# Patient Record
Sex: Female | Born: 1955 | Race: Black or African American | Hispanic: No | Smoking: Former smoker
Health system: Southern US, Community
[De-identification: ages and names within clinical notes are randomized; demographics above are authoritative.]

## PROBLEM LIST (undated history)

## (undated) DIAGNOSIS — I639 Cerebral infarction, unspecified: Secondary | ICD-10-CM

## (undated) DIAGNOSIS — M359 Systemic involvement of connective tissue, unspecified: Secondary | ICD-10-CM

## (undated) DIAGNOSIS — J849 Interstitial pulmonary disease, unspecified: Secondary | ICD-10-CM

## (undated) DIAGNOSIS — J449 Chronic obstructive pulmonary disease, unspecified: Secondary | ICD-10-CM

## (undated) DIAGNOSIS — M329 Systemic lupus erythematosus, unspecified: Secondary | ICD-10-CM

## (undated) HISTORY — PX: APPENDECTOMY: SHX54

## (undated) HISTORY — PX: TONSILLECTOMY: SUR1361

## (undated) HISTORY — PX: ABDOMINAL HYSTERECTOMY: SHX81

---

## 2002-07-05 ENCOUNTER — Encounter: Payer: Self-pay | Admitting: Internal Medicine

## 2002-07-05 ENCOUNTER — Inpatient Hospital Stay (HOSPITAL_COMMUNITY): Admission: EM | Admit: 2002-07-05 | Discharge: 2002-07-07 | Payer: Self-pay | Admitting: *Deleted

## 2002-07-05 ENCOUNTER — Encounter: Payer: Self-pay | Admitting: *Deleted

## 2002-07-06 ENCOUNTER — Encounter: Payer: Self-pay | Admitting: Cardiology

## 2002-07-13 ENCOUNTER — Encounter: Payer: Self-pay | Admitting: Emergency Medicine

## 2002-07-13 ENCOUNTER — Emergency Department (HOSPITAL_COMMUNITY): Admission: EM | Admit: 2002-07-13 | Discharge: 2002-07-13 | Payer: Self-pay | Admitting: Emergency Medicine

## 2002-07-13 ENCOUNTER — Encounter: Admission: RE | Admit: 2002-07-13 | Discharge: 2002-07-13 | Payer: Self-pay | Admitting: Internal Medicine

## 2002-07-13 ENCOUNTER — Encounter: Payer: Self-pay | Admitting: Internal Medicine

## 2002-09-29 ENCOUNTER — Emergency Department (HOSPITAL_COMMUNITY): Admission: EM | Admit: 2002-09-29 | Discharge: 2002-09-30 | Payer: Self-pay

## 2003-01-05 ENCOUNTER — Emergency Department (HOSPITAL_COMMUNITY): Admission: EM | Admit: 2003-01-05 | Discharge: 2003-01-06 | Payer: Self-pay | Admitting: Emergency Medicine

## 2003-01-06 ENCOUNTER — Encounter: Payer: Self-pay | Admitting: Emergency Medicine

## 2003-04-24 ENCOUNTER — Encounter: Admission: RE | Admit: 2003-04-24 | Discharge: 2003-07-23 | Payer: Self-pay

## 2004-07-10 ENCOUNTER — Emergency Department: Payer: Self-pay | Admitting: Unknown Physician Specialty

## 2005-08-12 ENCOUNTER — Emergency Department (HOSPITAL_COMMUNITY): Admission: EM | Admit: 2005-08-12 | Discharge: 2005-08-12 | Payer: Self-pay | Admitting: Emergency Medicine

## 2005-09-10 ENCOUNTER — Ambulatory Visit: Payer: Self-pay | Admitting: Internal Medicine

## 2006-09-04 ENCOUNTER — Inpatient Hospital Stay (HOSPITAL_COMMUNITY): Admission: EM | Admit: 2006-09-04 | Discharge: 2006-09-07 | Payer: Self-pay | Admitting: Emergency Medicine

## 2006-12-13 ENCOUNTER — Emergency Department (HOSPITAL_COMMUNITY): Admission: EM | Admit: 2006-12-13 | Discharge: 2006-12-13 | Payer: Self-pay | Admitting: Emergency Medicine

## 2007-05-09 ENCOUNTER — Emergency Department (HOSPITAL_COMMUNITY): Admission: EM | Admit: 2007-05-09 | Discharge: 2007-05-09 | Payer: Self-pay | Admitting: *Deleted

## 2007-05-16 ENCOUNTER — Emergency Department (HOSPITAL_COMMUNITY): Admission: EM | Admit: 2007-05-16 | Discharge: 2007-05-16 | Payer: Self-pay | Admitting: Emergency Medicine

## 2011-01-01 NOTE — H&P (Signed)
NAME:  [name], [name] NO.:  [id number]   MEDICAL RECORD NO.:  [id number]          PATIENT TYPE:  EMS   LOCATION:  MAJO                         FACILITY:  [facility]   PHYSICIAN:  Hettie Holstein, D.O.    DATE OF BIRTH:  24-Aug-1955   DATE OF ADMISSION:  09/04/2006  DATE OF DISCHARGE:                              HISTORY & PHYSICAL   PRIMARY CARE PHYSICIAN:  Unassigned.   GENERAL SURGERY CONSULTATION IN EMERGENCY DEPARTMENT:  Avel Peace,  M.D.   CHIEF COMPLAINT:  Right-sided abdominal pain with nausea, but no  vomiting, beginning yesterday.  Has a previous history for appendectomy,  hysterectomy, and cholecystectomy.  No prior history of bowel  obstruction or ileus.  Was discovered to have CT evidence of partial  small-bowel obstruction and felt possible to be related to adhesion  formation.  She was seen in the emergency department by Dr. Abbey Chatters.  Also, she was discovered to be hypokalemic, as well as discovered to  have a urinary tract infection.  She is being admitted for bowel rest,  observation, IV hydration.  We will follow her clinical course with  General Surgery.   PAST MEDICAL HISTORY:  Significant for lupus and E-chart reporting some  previous history of nephritis.  She is treated by Lavenia Atlas, M.D.,  in [place].  She is on chronic steroids.  On her CT this admission,  it is noticed that she does have some mild interstitial lung disease.  Her O2 saturation is marginal, 91% to 92%.  She is a current smoker.  In  addition, this admission, her CT revealed evidence of fatty liver.  She  is status post cholecystectomy, appendectomy, hysterectomy.  She has had  some back surgery at [facility].   MEDICATIONS:  Medications are as follows.  These are obtained from  medication list in from [facility].  She takes:  1. Aggrenox 1 tablet twice a day.  2. Gabapentin 300 mg daily.  3. Potassium chloride 20 mEq daily.  4. Prednisone  10 mg daily.  5. Detrol LA 4 mg p.o. daily.  6. Hydroxychloroquine 200 mg once a day.  7. Lisinopril 10 mg daily.  8. Lexapro 10 mg daily.  9. Trazodone 100 mg daily.  10.Lasix once a day.   ALLERGIES:  NO KNOWN DRUG ALLERGIES.   SOCIAL HISTORY:  She resides at [facility].  She  continues to smoke approximately a half a pack of cigarettes per day.  She does not drink alcohol.   FAMILY HISTORY:  Noncontributory.   REVIEW OF SYMPTOMS:  She had been in her usual state of health.  She  does ambulate, but has been unsteady on her feet according to her  family.  She has had no vomiting.  No blood in her stools.  No swelling  of her lower extremities.  The family has noticed some hypertrophy of  her nails, felt to be related to onychomycosis.  Otherwise, she has had  no fevers, chest pains, or shortness of breath.   PHYSICAL EXAMINATION:  In the department, her blood pressure is 122/82.  O2 saturation 91% on room air.  Heart rate 92.  Temperature 97.3.  General:  The patient is alert, oriented, no acute distress.  She does  close her eyes predominately during the examination.  She denies  photosensitivity; however, the family states that this is typical for  her.  HEENT:  Reveals her head to normocephalic, atraumatic.  Extraocular muscles are intact.  Neck:  Supple, nontender.  No palpable  thyromegaly or mass.  Cardiovascular exam:  Reveals normal S1, S2.  Lungs are clear bilaterally.  Abdomen:  Soft, slightly distended, and  minimal tenderness to deep palpation.  There is no rebound or guarding.  Bowel sounds are hypoactive.  Lower extremities:  Reveal very dry, scaly  skin.  Her nails are hypertrophic with the appearance of onychomycosis.  Neurological exam:  She moves all 4 extremities spontaneously.  There is  no focal deficit.   LABORATORY DATA:  Sodium 135, potassium 3.2, BUN 10, creatinine 0.78,  AST/ALT 36/24, albumin 3.2.  WBC 6.1, hemoglobin 13.9, platelet  count  129, MCV of 95.  CT, as noted above, revealed mild-to-moderate partial  small-bowel obstruction, questionable adhesion formation, with mild  diffuse fatty infiltration of the liver, and some mild interstitial lung  disease noted as well.  Urinalysis revealed 11 to 20 WBC.  Her lipase  was normal.   ASSESSMENT:  1. Partial small-bowel obstruction versus ileus.  2. Hypokalemia.  3. Urinary tract infection.  4. Chronic steroid therapy for lupus, fatty liver, as well as      interstitial lung disease.   PLAN:  At this time, we are going to admit the patient for bowel rest.  We will replete her electrolytes and administer IV fluids, maintain her  n.p.o., administer steroids via IV route, hold all of her oral  medications, and treat her urinary tract infection with Cipro.  Await  cultures for further customization based on these results.  We will  follow her clinical course with General Surgery.      Hettie Holstein, D.O.  Electronically Signed     ESS/MEDQ  D:  09/04/2006  T:  09/04/2006  Job:  [id number]

## 2011-01-01 NOTE — Consult Note (Signed)
NAME:  [name], [name]             ACCOUNT NO.:  [id number]   MEDICAL RECORD NO.:  [id number]          PATIENT TYPE:  EMS   LOCATION:  MAJO                         FACILITY:  [facility]   PHYSICIAN:  Pramod P. Pearlean Brownie, MD    DATE OF BIRTH:  1955-09-21   DATE OF CONSULTATION:  08/12/2005  DATE OF DISCHARGE:                                   CONSULTATION   REFERRING PHYSICIAN:  Dr. __________.   REASON FOR CONSULTATION:  Stroke.   HISTORY OF PRESENT ILLNESS:  [name] is a 55 year old African-American  lady who has been complaining of increasing difficulty walking with  stiffness and heaviness in her legs for the last 2-3 days.  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  She did not have neurological workup or followup after  that.  She was treated by her primary physician in [place].  She was  initially on aspirin but stopped it after a short time.  Her risk factors  include hypertension and  smoking.  The patient has been walking in the  emergency room since arrival.  She feels her legs do not feel right and she  has some worsening of right-sided numbness for the last few days.  On  inquiry, she also admits to increasing urinary frequency which she has had  off and on and has had recurrent UTI's.  She on inquiry also admits to  having noticed some difficulty in swallowing liquids for the last few days.  She denies any slurred speech, double vision, vertigo or increased focal  extremity weakness.   PAST MEDICAL HISTORY:  Lupus erythematosus for which she was seen by Dr.  Gavin Potters in [place] but she has not seen him for a long time.  She  remains on prednisone 5 mg a day for that.  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  Not significant  for ischemic heart disease, hyperlipidemia or diabetes.   CURRENT MEDICATIONS:  Prednisone 5 mg a day.  She does not take  any blood  pressure medications or aspirin at present.   SOCIAL HISTORY:  The patient is on disability.  She lives with her mother in  [place].  She was separated three years ago from her husband.  She used  to smoke half pack per day for 35 years and the last three months she has  cut down to 4-5 cigarettes a day.  She denies any alcohol or doing drugs.   REVIEW OF SYSTEMS:  Positive for depression following her stroke.  The  mother states she hardly goes out and has very decreased spontaneity and  does not talk a lot.  She cries a lot.  No history of diarrhea, chest pain,  palpitations, cough or shortness of breath.   PHYSICAL EXAMINATION:  GENERAL:  Reveals a middle-aged African-American lady  not in distress.  She is not febrile.  VITAL SIGNS:  Pulse rate 89 per minute regular, blood pressure 170/79,  respiratory rate 16 per  minute, distal pulses are heard.  HEENT:  Head is atraumatic.  ENT exam unremarkable.  NECK:  Supple without bruits.  CARDIAC:  Regular heart sounds.  LUNGS:  Clear to auscultation.  ABDOMEN:  Soft, nontender.  NEUROLOGIC:  The patient is awake, alert and oriented to time, place and  person.  She looks depressed.  She responds appropriately.  She is  emotionally labile and started crying briefly during the exam.  There is no  dysarthria or dysphagia.  Pupils are regular but reactive.  Eye movements  are full, there is no nystagmus.  __________.  Face is symmetric.  Palate  movements are normal.  Tongue is midline.  ___________.  Motor system exam  reveals no pronator drift.  Symmetric __________.  Reflexes are 2+ and  symmetric.  Plantar is downgoing.  Fine finger movements are symmetric.  She  does not __________ the left or right upper extremity.  Gait is slow and  steady.  There is slight increased tone in the lower extremities but I do  not  see any focal weakness.  She can stand on the metal base unsupported.  She had slight difficulty with tandem  walking.   LABORATORY DATA:  Potassium is low at 3.0, rest of the electrolytes are  normal.  BUN 8, creatinine report is not back.  UA shows 3-6 wbc's with  weakly positive leukocyte esterase.  CT scan of the head shows low density  in both basal ganglia as well as question of low density in the left pons  consistent with remote age infarct.  No acute abnormality on image is noted.   IMPRESSION:  A 55 year old lady with previous history of left brain stroke  and mild right-sided weakness presents with slightly worsening of her  symptoms likely multifactorial in etiology secondary to a combination of  urinary tract infection as well as depression.  I doubt this acute stroke.  I recommend getting an MRI scan of the brain to get a better idea of both  the timing and nature of her various strokes.  Appropriate treatment for  urinary tract infection and depression.  Followup with her primary care  physician in [place] for her medical problems.  Aspirin 81 mg a day for  stroke prevention.  Strict control of her hypertension with systolic blood  pressure well below 130 and will be started on antihypertensives as per her  family physician Dr. __________.  She may follow up electively as an  outpatient in their office if necessary or else with the neurologist in  [place].   Thank you for the referral.           ______________________________  Sunny Schlein. Pearlean Brownie, MD     PPS/MEDQ  D:  08/12/2005  T:  08/12/2005  Job:  [id number]

## 2011-01-01 NOTE — Discharge Summary (Signed)
[name], [name] NO.:  [id number]   MEDICAL RECORD NO.:  [id number]                   PATIENT TYPE:  INP   LOCATION:  3709                                 FACILITY:  [facility]   PHYSICIAN:  Fransisco Hertz, M.D.               DATE OF BIRTH:  April 05, 1956   DATE OF ADMISSION:  07/05/2002  DATE OF DISCHARGE:  07/07/2002                                 DISCHARGE SUMMARY   PRIMARY CARE PHYSICIAN:  Dr. Ladell Pier, Department of Internal  Medicine.   RHEUMATOLOGY:  Dr. Earlene Plater.   DISCHARGE DIAGNOSES:  1. Chest pain secondary to rib fractures.  2. 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  3. Hypertension.  4. Tobacco abuse.   DISCHARGE MEDICATIONS:  1. Hydrochlorothiazide 25 mg p.o. q.d.  2. Prednisone 10 mg p.o. q.d.  3. Calcium carbonate 1500 mg p.o. t.i.d. with meals.  4. Ergocalciferol one tab p.o. t.i.d. with meals.  5. Percocet 5/325 one p.o. q.6h. p.r.n. pain.  6. Atarax 25 mg p.o. q.8h. p.r.n. itch.   DISPOSITION:  The patient is discharged home in good condition.   FOLLOW UP:  The patient is to follow up in the outpatient clinic with Dr.  Ladell Pier on July 31, 2002 at 1:30 p.m.  In the interim, she is to  have a DEXA scan.  Unfortunately, we were unable to schedule the scan at  this time but I will do this for her and call her with her appointment on  Monday.   ISSUES TO FOLLOWUP:  The patient will require follow up of her DEXA scan.  It is likely that she has osteopenia secondary to her chronic steroid use.  She has been placed on calcium carbonate and ergocalciferol.  If this does  in fact show osteopenia the patient would benefit with some Alendronate 70  mg p.o. every week and possible endocrinology consultation for consideration  of recombinant parathyroid hormone.   ADMITTING HISTORY AND PHYSICAL WITH LABORATORIES:   HISTORY OF PRESENT ILLNESS:  The patient is a 55 year old African-American  female with a past medical history significant for systemic lupus  erythematosus diagnosed in 1990 on chronic prednisone who presents with  heavy substernal chest pain.  The patient reports that she was lying in bed  at approximately 11:30 when she had a sudden onset of heavy substernal chest  pain.  The chest pain was accompanied by nausea and shortness of breath.  The pain was worsened when the patient was supine.  The pain improved over  time but had not completely resolved by 5:30 a.m.  The pain was concerning  enough that the patient came in the ER for further evaluation.  The patient  reports that she did have similar episode 2 weeks ago with exquisite pain in  the left breast and back and was seen in the [facility] ER.  She was treated  with narcotics.  She had  a mammogram.  This was normal.  Per the patient,  the pain was an 8-9/10 prior to the emergency department and was only  temporarily improved with sublingual nitroglycerin.   PHYSICAL EXAMINATION ON ADMISSION:  VITAL SIGNS: Temperature 97.8, pulse  107, blood pressure 135/78, respiratory rate 24, O2 saturations 99% on room  air.  GENERAL: The patient was awake and alert.  She was pleasant but a little  anxious.  She was sitting up in bed.  HEENT: Sclerae were nonicteric and not injected.  Pupils were equal, round,  and reactive to light.  TMs were within normal limits.  The oropharynx was  benign.  NECK: Supple with no JVD or carotid bruits appreciated.  RESPIRATIONS: Clear to auscultation bilaterally without wheezes, crackles,  or rhonchi.  CARDIOVASCULAR: Tachycardic with a regular rhythm.  No rub was appreciated.  There was tenderness to palpation diffusely in the chest.  ABDOMEN: Soft, nontender, nondistended, with normal bowel sounds.  EXTREMITIES: 1 to 2+ pitting edema in the bilateral lower extremities.  SKIN: Dry skin on the bilateral lower extremities, worse on the feet.  NEUROLOGIC: Nonfocal.   LABORATORY DATA ON  ADMISSION:  White blood cell count 7.4, hemoglobin 14.6,  hematocrit 44, platelets 102, ANC 51, MCV of 88.  Sodium 142, potassium 3.5,  chloride 107, bicarb 23, BUN 13, creatinine 0.7, glucose 87, sed rate 25.  Initial cardiac enzymes revealed CK of 3.6, MB of 0.5, troponin 0.02.  Chest  x-ray revealed no acute disease.  EKG revealed sinus tachycardia with a rate  of 101.  There was good R-wave progression and no ST elevation or depression  consistent with ischemia.  A CT on admission revealed no evidence of PE or  DVT.  The patient did have multiple old rib fractures and one left upper rib  fracture that was in the healing process.   HOSPITAL COURSE:  Problem 1. CHEST PAIN.  The patient was admitted and ruled  out for a myocardial infarction.  Her enzymes remained normal and her EKGs  did not change significantly.  Given that we had ruled out cardiac cause of  the pain or PE, it was felt that the pain was indeed secondary to the  patient's rib fractures.  Of note, the patient has been on prednisone for  many years with no calcium or vitamin D supplementation.  It is likely that  she has an underlying osteopenia that causes her to fracture rather easily.  The patient will undergo a DEXA scan on an outpatient basis.  She was  started on calcium and vitamin D supplementation.  The patient may benefit  from Alendronate plus/minus recombinant parathyroid hormone should she  indeed have severe osteopenia leading to these rib fractures.  She was  treated symptomatically with Percocet and discharged home with this  medication.   Problem 2. SYSTEMIC LUPUS ERYTHEMATOSUS COMPLICATED BY THROMBOCYTOPENIA.  The patient should have her platelet count monitored on an outpatient basis.  She will continue on her prednisone 10 mg p.o. q.d. while in the hospital.  She is discharged home on this medication as well.  Problem 3. HYPERTENSION.  The patient's blood pressure was relatively well  controlled with  systolics in the 100s-120s on her hydrochlorothiazide.  She  will be discharged home on this same dose.  Her blood pressure may require  further monitoring in the outpatient setting.   Problem 4. TOBACCO ABUSE.  The patient was counseled regarding her tobacco  abuse.  This should continue to  occur in the outpatient setting as this is  not beneficial for her cardiac or pulmonary health.    DISCHARGE LABORATORIES:  The final available labs drawn on this patient were  drawn on July 07, 2002 and were as follows: White blood cell count 6.5,  hemoglobin 13.7, platelets 93; sodium 142, potassium 3.7, chloride 107,  bicarb 29, BUN 6, creatinine 0.7, glucose 76, calcium 9.0.     Kennith Gain, M.D.                  Fransisco Hertz, M.D.    CM/MEDQ  D:  07/07/2002  T:  07/08/2002  Job:  [id number]   cc:   Ladell Pier, M.D.  [address]-Internal Medicine Resident  [place], [place] [address]  Fax: [phone]   Dr. Earlene Plater, Rheumatology   Outpatient Clinic

## 2011-01-01 NOTE — Discharge Summary (Signed)
[name], [name] NO.:  [id number]   MEDICAL RECORD NO.:  [id number]          PATIENT TYPE:  INP   LOCATION:  5731                         FACILITY:  [facility]   PHYSICIAN:  Beckey Rutter, MD  DATE OF BIRTH:  1955-09-15   DATE OF ADMISSION:  09/04/2006  DATE OF DISCHARGE:  09/07/2006                               DISCHARGE SUMMARY   CHIEF COMPLAINT ON ADMISSION:  Right-sided abdominal pain.   PRIMARY CARE PHYSICIAN:  Patient is unassigned to [facility].   HISTORY OF PRESENT ILLNESS:  Briefly, this patient is a 55 year old  African American female with a past medical history significant for  stroke, current smoker, history of appendectomy, and history of  cholecystectomy and hysterectomy.  She came with a picture of small  bowel obstruction, which has resolved during hospital course.   CONSULTATION DURING HOSPITAL COURSE:  Patient was seen by Dr. Avel Peace from surgery with recommendation of bowel rest.  Consultation  was done on the January 20, date of admission.   HOSPITAL PROCEDURES:  Patient has underwent CT pelvis on September 04, 2006, with impression of:  1. Mild-to-moderate partial small bowel proximal obstruction, most      likely due to adhesion formation.  2. Mild diffuse fatty infiltration of the liver.  3. Small liver cyst.  4. Mild interstitial lung disease.   Patient also had CT abdomen showing no acute pelvis abnormality.  The  abdomen result was read earlier.   Abdomen x-ray done on January 21, with impression of:  1. Improvement in small bowel dilatation.  2. Retained contrast in the right colon.  3. No free air.   Patient then had a CT head without contrast showing mild generalized  atrophy, normal ventricular morphology, no midline shift or mid-mass  effect, old left pontine infarct, old bilateral basal ganglia infarcts  extending to the parietal and periventricular white matter.  No cortical  infarction, hemorrhage, or  mass.  Remainder of posterior fossa  unremarkable.  No extra-axial fluid collection visualized, and bones  unremarkable.  Their impression was old left pontine and bilateral basal  ganglia lacunar infarcts, no acute intracranial abnormalities.  And the  patient had MRI done today, January 23, with the final result still  pending, but primary report showing no acute pathology.   DISCHARGE MEDICATIONS:  1. Aggrenox 1 tablet twice a day.  2. Gabapentin/Neurontin 300 mg daily.  3. Potassium chloride 20 mg p.o. daily.  4. Prednisone 10 mg daily.  5. Detrol LA 4 mg p.o. daily.  6. Hydroxychloroquine 200 mg once daily.  7. Lisinopril 100 mg daily.  8. Lexapro 10 mg daily.  9. Trazodone 100 mg daily.  10.Lasix 20 mg once a day.   DISCHARGE DIAGNOSES:  1. Partial small bowel obstruction.  2. Hypokalemia.  3. Urinary tract infection.  4. Chronic steroid therapy for lupus.  5. Fatty liver, as well as interstitial lung disease.   HOSPITAL COURSE:  [name] is a 55 year old African American female  who came with picture of intestinal obstruction that improved during  hospital course.  She was hypokalemic and  that was repleted during  hospital course, as well.  Patient also had evidence of urinary tract  infection.  She was receiving Cipro since admission.  Patient will be  discharged on Cipro 500 mg twice a day.  During hospital course, patient  showed evidence of weakness that was suspicious of new CVA.  Patient was  evaluated by CT head, which is negative for acute event.  Also, she was  evaluated by MRI that was essentially negative for acute intracranial  pathology.  Recommendation is patient to be discharged on Cipro for 3  more days.  Patient has evidence of fatty liver on the CAT scan, and for  that we would recommend liver function tests to be evaluated, and  consider statin as needed though patient has CVA in the past and  secondary prevention of CVA is crucial at this time.   We think the CVA  is essentially because of the effect of lupus rather than plaque  obstruction pathology.  So, we would prefer to hold on to the statin at  the current time until fatty liver is improved.  Patient is stable for  discharge today.      Beckey Rutter, MD  Electronically Signed     EME/MEDQ  D:  09/07/2006  T:  09/07/2006  Job:  [id number]

## 2011-01-01 NOTE — Consult Note (Signed)
[name], BENN NO.:  [id number]   MEDICAL RECORD NO.:  [id number]          PATIENT TYPE:  INP   LOCATION:  5731                         FACILITY:  [facility]   PHYSICIAN:  Adolph Pollack, M.D.DATE OF BIRTH:  1955/09/03   DATE OF CONSULTATION:  DATE OF DISCHARGE:                                 CONSULTATION   REASON:  Small bowel obstruction.   HISTORY OF PRESENT ILLNESS:  This is a 55 year old female with abrupt  onset of sharp right lower quadrant suprapubic pain yesterday around  lunch.  Pain persisted.  She had some nausea but no vomiting.  She was  having dysuria.  She had a normal bowel yesterday, has been passing gas  yesterday and today since the pain started.  She presents to the  emergency department for evaluation.  In the emergency department, she  underwent a CT scan looking for source of the pain.  This demonstrated  some mild to moderate proximal small bowel dilatation with contrast in  the colon and small amount of free fluid.  It was felt this could  potentially represent an early small-bowel obstruction and I was called  to see her.   PAST MEDICAL HISTORY:  1. Lupus.  2. Depression.  3. Urinary incontinence.  4. Cerebrovascular accident.  5. Hypertension.  6. Lumbar spine disease.   PREVIOUS OPERATIONS:  1. Lower back surgery.  2. Appendectomy.  3. Cholecystectomy.  4. Abdominal hysterectomy.   ALLERGIES:  None.   MEDICATIONS:  Aggrenox, gabapentin, potassium, prednisone, Detrol,  hydroxychloroquine, lisinopril, Lexapro, Lasix, trazodone.   SOCIAL HISTORY:  She is a smoker.  Denies drug abuse or alcohol use.   REVIEW OF SYSTEMS:  CARDIOVASCULAR:  She denies heart disease.  GENERALLY:  She said she has had a 10-pound weight loss over the last  year or two.  PULMONARY:  She denies asthma, COPD, pneumonia.  GI:  She  denies peptic ulcer disease and diverticulitis.  GU:  She denies kidney  stones.  HEMATOLOGIC:  She denies deep  venous thrombosis or bleeding  disorders.   PHYSICAL EXAM:  GENERALLY:  Well-developed, well-nourished female in no  acute distress, pleasant, cooperative.  Temperature is 97, blood  pressure 122/82, pulse 92.  NECK:  Supple without masses.  RESPIRATORY:  Breath sounds equal and clear.  Respirations unlabored.  CARDIOVASCULAR:  Demonstrates a regular rate and regular rhythm.  I do  not hear a murmur.  ABDOMEN:  Soft.  There is a right upper quadrant scar, right lower  quadrant scar and lower transverse scar all solid without hernias.  A  small umbilical hernia is present.  She has most of her tenderness to  palpation in the suprapubic region and a little bit in the right lower  quadrant area.  EXTREMITIES:  She had some trace pretibial edema at present.   LABORATORY DATA:  White cell count 6100, no left shift, potassium 3.2.  Urinalysis shows 11-20 white blood cells and many bacteria.  CT scan  reviewed and of note is that contrast does get through the right and  proximal transverse colon.  There are  a few dilated small-bowel loops  proximally.   IMPRESSION:  Urinary tract infection likely with mild reactive ileus-  doubt small-bowel obstruction at this time.   RECOMMENDATIONS:  I think she needs bowel rest and have her urinary  tract infection treated.  I will order follow-up x-rays in the morning.      Adolph Pollack, M.D.  Electronically Signed     TJR/MEDQ  D:  09/04/2006  T:  09/04/2006  Job:  [id number]

## 2011-05-27 LAB — DIFFERENTIAL
Basophils Absolute: 0
Lymphocytes Relative: 24
Lymphs Abs: 1.3
Monocytes Absolute: 0.5
Neutro Abs: 3.4

## 2011-05-27 LAB — I-STAT 8, (EC8 V) (CONVERTED LAB)
BUN: 15
Bicarbonate: 26.2 — ABNORMAL HIGH
HCT: 45
Hemoglobin: 15.3 — ABNORMAL HIGH
Operator id: 294501
Potassium: 3 — ABNORMAL LOW
Sodium: 145
TCO2: 28

## 2011-05-27 LAB — CBC
HCT: 41.7
Hemoglobin: 14.1
RDW: 13.2
WBC: 5.3

## 2011-05-27 LAB — POCT I-STAT CREATININE: Operator id: 294501

## 2015-12-20 ENCOUNTER — Encounter: Payer: Self-pay | Admitting: Emergency Medicine

## 2015-12-20 DIAGNOSIS — Z8673 Personal history of transient ischemic attack (TIA), and cerebral infarction without residual deficits: Secondary | ICD-10-CM | POA: Insufficient documentation

## 2015-12-20 DIAGNOSIS — Z87891 Personal history of nicotine dependence: Secondary | ICD-10-CM | POA: Diagnosis not present

## 2015-12-20 DIAGNOSIS — L97903 Non-pressure chronic ulcer of unspecified part of unspecified lower leg with necrosis of muscle: Secondary | ICD-10-CM | POA: Diagnosis not present

## 2015-12-20 DIAGNOSIS — Z79899 Other long term (current) drug therapy: Secondary | ICD-10-CM | POA: Insufficient documentation

## 2015-12-20 DIAGNOSIS — L98499 Non-pressure chronic ulcer of skin of other sites with unspecified severity: Secondary | ICD-10-CM | POA: Insufficient documentation

## 2015-12-20 NOTE — ED Notes (Signed)
Pt here with family friend with whom she's been staying for a few weeks; pt with large ulcerated area to right lower leg; serosanguinous drainage; family member says when pt has her bedroom door closed for a bit and then opens it, she can smell a foul odor; pt says pain to leg intermittently; denies fever; pt with pitting edema to bilateral lower extremities;

## 2015-12-21 ENCOUNTER — Emergency Department
Admission: EM | Admit: 2015-12-21 | Discharge: 2015-12-21 | Disposition: A | Discharge status: Home / Self Care | Payer: Medicare Other | Attending: Emergency Medicine | Admitting: Emergency Medicine

## 2015-12-21 ENCOUNTER — Emergency Department: Payer: Medicare Other

## 2015-12-21 DIAGNOSIS — L98499 Non-pressure chronic ulcer of skin of other sites with unspecified severity: Secondary | ICD-10-CM

## 2015-12-21 HISTORY — DX: Cerebral infarction, unspecified: I63.9

## 2015-12-21 HISTORY — DX: Systemic lupus erythematosus, unspecified: M32.9

## 2015-12-21 LAB — CBC
HEMATOCRIT: 45 % (ref 35.0–47.0)
Hemoglobin: 15 g/dL (ref 12.0–16.0)
MCH: 32.2 pg (ref 26.0–34.0)
MCHC: 33.3 g/dL (ref 32.0–36.0)
MCV: 96.8 fL (ref 80.0–100.0)
PLATELETS: 105 10*3/uL — AB (ref 150–440)
RBC: 4.65 MIL/uL (ref 3.80–5.20)
RDW: 13.3 % (ref 11.5–14.5)
WBC: 6.6 10*3/uL (ref 3.6–11.0)

## 2015-12-21 LAB — BASIC METABOLIC PANEL
Anion gap: 8 (ref 5–15)
BUN: 18 mg/dL (ref 6–20)
CO2: 26 mmol/L (ref 22–32)
Calcium: 8.7 mg/dL — ABNORMAL LOW (ref 8.9–10.3)
Chloride: 108 mmol/L (ref 101–111)
Creatinine, Ser: 0.95 mg/dL (ref 0.44–1.00)
GFR calc Af Amer: >60 mL/min (ref >60)
GLUCOSE: 82 mg/dL (ref 65–99)
POTASSIUM: 4 mmol/L (ref 3.5–5.1)
Sodium: 142 mmol/L (ref 135–145)

## 2015-12-21 MED ORDER — TRAMADOL HCL 50 MG PO TABS: 50.0000 mg | ORAL_TABLET | Freq: Four times a day (QID) | ORAL | Status: DC | PRN

## 2015-12-21 MED ORDER — MORPHINE SULFATE (PF) 2 MG/ML IV SOLN
2.0000 mg | Freq: Once | INTRAVENOUS | Status: AC
  Administered 2015-12-21: 2 mg via INTRAMUSCULAR
  Filled 2015-12-21: qty 1

## 2015-12-21 MED ORDER — SULFAMETHOXAZOLE-TRIMETHOPRIM 800-160 MG PO TABS: 1.0000 | ORAL_TABLET | Freq: Two times a day (BID) | ORAL | Status: DC

## 2015-12-21 MED ORDER — SULFAMETHOXAZOLE-TRIMETHOPRIM 800-160 MG PO TABS
1.0000 | ORAL_TABLET | Freq: Once | ORAL | Status: AC
  Administered 2015-12-21: 1 via ORAL
  Filled 2015-12-21: qty 1

## 2015-12-21 NOTE — ED Provider Notes (Signed)
[facility] Emergency Department Provider Note    ____________________________________________  Time seen: ~0255  I have reviewed the triage vital signs and the nursing notes.   HISTORY  Chief Complaint Skin Ulcer   History limited by: Not Limited   HPI [name] is a 60 y.o. female who presents to the emergency department today because of concerns for a skin ulcer to her right medial lower leg. The patient states that she first noticed it about a week ago. She states that she does not think that she cut it on anything. She is not sure why it started. She states that since that time is gradually gotten worse. It has been accompanied by some pain. She states she came in tonight because it is just continued to get worse. She states she has had a similar thing happened to her in the past. She is unsure what happened to her in the past. She states she was put on antibiotics but did not require any surgery. She does have a history of lupus and states she is on prednisone for that chronically. Denies any other medication. Denies any fevers, nausea or vomiting.   Past Medical History  Diagnosis Date  . Lupus (HCC)   . Stroke [facility])     There are no active problems to display for this patient.   Past Surgical History  Procedure Laterality Date  . Appendectomy    . Tonsillectomy    . Abdominal hysterectomy      Current Outpatient Rx  Name  Route  Sig  Dispense  Refill  . predniSONE (DELTASONE) 10 MG tablet   Oral   Take 10 mg by mouth daily with breakfast.           Allergies Review of patient's allergies indicates no known allergies.  History reviewed. No pertinent family history.  Social History Social History  Substance Use Topics  . Smoking status: Former Games developer  . Smokeless tobacco: None  . Alcohol Use: No    Review of Systems  Constitutional: Negative for fever. Cardiovascular: Negative for chest pain. Respiratory: Negative  for shortness of breath. Gastrointestinal: Negative for abdominal pain, vomiting and diarrhea. Neurological: Negative for headaches, focal weakness or numbness.  10-point ROS otherwise negative.  ____________________________________________   PHYSICAL EXAM:  VITAL SIGNS: ED Triage Vitals  Enc Vitals Group     BP 12/20/15 2341 150/94 mmHg     Pulse Rate 12/20/15 2341 84     Resp 12/20/15 2341 18     Temp 12/20/15 2341 98.3 F (36.8 C)     Temp Source 12/20/15 2341 Oral     SpO2 12/20/15 2341 99 %     Weight 12/20/15 2341 150 lb (68.04 kg)     Height 12/20/15 2341 5\' 2"  (1.575 m)     Head Cir --      Peak Flow --      Pain Score 12/20/15 2341 8   Constitutional: Alert and oriented. Well appearing and in no distress. Eyes: Conjunctivae are normal. PERRL. Normal extraocular movements. ENT   Head: Normocephalic and atraumatic.   Nose: No congestion/rhinnorhea.   Mouth/Throat: Mucous membranes are moist.   Neck: No stridor. Hematological/Lymphatic/Immunilogical: No cervical lymphadenopathy. Cardiovascular: Normal rate, regular rhythm.  No murmurs, rubs, or gallops. Respiratory: Normal respiratory effort without tachypnea nor retractions. Breath sounds are clear and equal bilaterally. No wheezes/rales/rhonchi. Gastrointestinal: Soft and nontender. No distention. Genitourinary: Deferred Musculoskeletal: Normal range of motion in all extremities. No joint effusions.  Neurologic:  Normal speech and language. No gross focal neurologic deficits are appreciated.  Skin:  Roughly 4 cm diameter ulcer to the right medial lower leg. Some necrotic skin overlying this. Slightly foul smell to it. Some purulence noted. Slightly tender to palpation. Psychiatric: Mood and affect are normal. Speech and behavior are normal. Patient exhibits appropriate insight and judgment.  ____________________________________________    LABS (pertinent positives/negatives)  Labs Reviewed  CBC -  Abnormal; Notable for the following:    Platelets 105 (*)    All other components within normal limits  BASIC METABOLIC PANEL - Abnormal; Notable for the following:    Calcium 8.7 (*)    All other components within normal limits     ____________________________________________   EKG  None  ____________________________________________    RADIOLOGY  None  ____________________________________________   PROCEDURES  Procedure(s) performed: None  Critical Care performed: No  ____________________________________________   INITIAL IMPRESSION / ASSESSMENT AND PLAN / ED COURSE  Pertinent labs & imaging results that were available during my care of the patient were reviewed by me and considered in my medical decision making (see chart for details).  Patient presents to the emergency department today because of concerns for skin ulcer of the right lower leg. On exam there is a roughly 4 cm ulcer there. Blood work without any leukocytosis. Patient is afebrile. The patient's dorsalis pedis pulse was easily dopplerable. Will check x-ray to make sure there is no osseous involvement.  X-ray did not show any osseous involvement. Will have patient follow-up with wound care clinic. ____________________________________________   FINAL CLINICAL IMPRESSION(S) / ED DIAGNOSES  Final diagnoses:  Skin ulcer, with unspecified severity (HCC)     Phineas Semen, MD 12/21/15 [phone]

## 2015-12-21 NOTE — Discharge Instructions (Signed)
Please seek medical attention for any high fevers, chest pain, shortness of breath, change in behavior, persistent vomiting, bloody stool or any other new or concerning symptoms.  Wound Care Taking care of your wound properly can help to prevent pain and infection. It can also help your wound to heal more quickly.  HOW TO CARE FOR YOUR WOUND  Take or apply over-the-counter and prescription medicines only as told by your health care provider.  If you were prescribed antibiotic medicine, take or apply it as told by your health care provider. Do not stop using the antibiotic even if your condition improves.  Clean the wound each day or as told by your health care provider.  Wash the wound with mild soap and water.  Rinse the wound with water to remove all soap.  Pat the wound dry with a clean towel. Do not rub it.  There are many different ways to close and cover a wound. For example, a wound can be covered with stitches (sutures), skin glue, or adhesive strips. Follow instructions from your health care provider about:  How to take care of your wound.  When and how you should change your bandage (dressing).  When you should remove your dressing.  Removing whatever was used to close your wound.  Check your wound every day for signs of infection. Watch for:  Redness, swelling, or pain.  Fluid, blood, or pus.  Keep the dressing dry until your health care provider says it can be removed. Do not take baths, swim, use a hot tub, or do anything that would put your wound underwater until your health care provider approves.  Raise (elevate) the injured area above the level of your heart while you are sitting or lying down.  Do not scratch or pick at the wound.  Keep all follow-up visits as told by your health care provider. This is important. SEEK MEDICAL CARE IF:  You received a tetanus shot and you have swelling, severe pain, redness, or bleeding at the injection site.  You have a  fever.  Your pain is not controlled with medicine.  You have increased redness, swelling, or pain at the site of your wound.  You have fluid, blood, or pus coming from your wound.  You notice a bad smell coming from your wound or your dressing. SEEK IMMEDIATE MEDICAL CARE IF:  You have a red streak going away from your wound.   This information is not intended to replace advice given to you by your health care provider. Make sure you discuss any questions you have with your health care provider.   Document Released: 05/11/2008 Document Revised: 12/17/2014 Document Reviewed: 07/29/2014 Elsevier Interactive Patient Education [employer]

## 2015-12-21 NOTE — ED Notes (Signed)
Pt with 2+ right pedal pulse. Cms intact to toes. No hair present on lower extremities.

## 2015-12-23 ENCOUNTER — Encounter: Payer: Medicare Other | Attending: Internal Medicine | Admitting: Internal Medicine

## 2015-12-23 DIAGNOSIS — J40 Bronchitis, not specified as acute or chronic: Secondary | ICD-10-CM | POA: Insufficient documentation

## 2015-12-23 DIAGNOSIS — Z87891 Personal history of nicotine dependence: Secondary | ICD-10-CM | POA: Diagnosis not present

## 2015-12-23 DIAGNOSIS — Z79899 Other long term (current) drug therapy: Secondary | ICD-10-CM | POA: Diagnosis not present

## 2015-12-23 DIAGNOSIS — M3210 Systemic lupus erythematosus, organ or system involvement unspecified: Secondary | ICD-10-CM | POA: Insufficient documentation

## 2015-12-23 DIAGNOSIS — I87322 Chronic venous hypertension (idiopathic) with inflammation of left lower extremity: Secondary | ICD-10-CM | POA: Diagnosis not present

## 2015-12-23 DIAGNOSIS — R600 Localized edema: Secondary | ICD-10-CM | POA: Insufficient documentation

## 2015-12-23 DIAGNOSIS — I87331 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 lower extremity: Secondary | ICD-10-CM | POA: Diagnosis not present

## 2015-12-23 DIAGNOSIS — Z8673 Personal history of transient ischemic attack (TIA), and cerebral infarction without residual deficits: Secondary | ICD-10-CM | POA: Insufficient documentation

## 2015-12-23 DIAGNOSIS — L97811 Non-pressure chronic ulcer of other part of right lower leg limited to breakdown of skin: Secondary | ICD-10-CM | POA: Diagnosis not present

## 2015-12-23 DIAGNOSIS — I10 Essential (primary) hypertension: Secondary | ICD-10-CM | POA: Diagnosis not present

## 2015-12-24 NOTE — Progress Notes (Signed)
[name], [name] ([id number]) Visit Report for 12/23/2015 Abuse/Suicide Risk Screen Details Edison, Gaylia Date of Service: 12/23/2015 3:00 PM Patient Name: [name] Account Number: [id number] Medical Record Treating RN: Huel Coventry [id number] Number: Other Clinician: 19-Dec-1955 (59 y.o. Treating ROBSON, MICHAEL Date of Birth/Sex: Female) Physician/Extender: G Primary Care PATIENT, NO Physician: Referring Physician: Phineas Semen Weeks in Treatment: 0 Abuse/Suicide Risk Screen Items Answer ABUSE/SUICIDE RISK SCREEN: Has anyone close to you tried to hurt or harm you recentlyo No Do you feel uncomfortable with anyone in your familyo No Has anyone forced you do things that you didnot want to doo No Do you have any thoughts of harming yourselfo No Patient displays signs or symptoms of abuse and/or neglect. No Electronic Signature(s) Signed: 12/23/2015 4:49:25 PM By: Elliot Gurney, RN, [name], [name], [name] Entered By: Elliot Gurney, RN, [name], Kim on 12/23/2015 15:43:06 [name], [name] ([id number]) -------------------------------------------------------------------------------- Activities of Daily Living Details [name] Date of Service: 12/23/2015 3:00 PM Patient Name: [name] Account Number: [id number] Medical Record Treating RN: Huel Coventry [id number] Number: Other Clinician: 1955-09-04 (59 y.o. Treating ROBSON, MICHAEL Date of Birth/Sex: Female) Physician/Extender: G Primary Care PATIENT, NO Physician: Referring Physician: Everette Rank in Treatment: 0 Activities of Daily Living Items Answer Activities of Daily Living (Please select one for each item) Drive Automobile Not Able Take Medications Completely Able Use Telephone Completely Able Care for Appearance Completely Able Use Toilet Completely Able Bath / Shower Completely Able Dress Self Completely Able Feed Self Completely Able Walk Completely Able Get In / Out Bed Completely Able Housework Need  Assistance Prepare Meals Completely Able Handle Money Completely Able Shop for Self Need Assistance Electronic Signature(s) Signed: 12/23/2015 4:49:25 PM By: Elliot Gurney, RN, [name], [name], [name] Entered By: Elliot Gurney, RN, [name], Kim on 12/23/2015 15:43:38 [name], [name] ([id number]) -------------------------------------------------------------------------------- Education Assessment Details [name] Date of Service: 12/23/2015 3:00 PM Patient Name: [name] Account Number: [id number] Medical Record Treating RN: Huel Coventry [id number] Number: Other Clinician: Feb 04, 1956 (59 y.o. Treating ROBSON, MICHAEL Date of Birth/Sex: Female) Physician/Extender: G Primary Care PATIENT, NO Physician: Referring Physician: Everette Rank in Treatment: 0 Primary Learner Assessed: Patient Learning Preferences/Education Level/Primary Language Learning Preference: Explanation, Demonstration Highest Education Level: High School Preferred Language: English Cognitive Barrier Assessment/Beliefs Language Barrier: No Translator Needed: No Memory Deficit: No Emotional Barrier: No Cultural/Religious Beliefs Affecting Medical No Care: Physical Barrier Assessment Impaired Vision: Yes Glasses, reading Impaired Hearing: No Knowledge/Comprehension Assessment Knowledge Level: High Comprehension Level: High Ability to understand written High instructions: Ability to understand verbal High instructions: Motivation Assessment Anxiety Level: Calm Cooperation: Cooperative Education Importance: Acknowledges Need Interest in Health Problems: Asks Questions Perception: Coherent Willingness to Engage in Self- High Management Activities: Readiness to Engage in Self- High Management Activities: [name], [name] ([id number]) Electronic Signature(s) Signed: 12/23/2015 4:49:25 PM By: Elliot Gurney, RN, [name], [name], [name] Entered By: Elliot Gurney, RN, [name], Kim on 12/23/2015 15:44:34 [name], [name]  ([id number]) -------------------------------------------------------------------------------- Fall Risk Assessment Details [name], [name] Date of Service: 12/23/2015 3:00 PM Patient Name: [name] Account Number: [id number] Medical Record Treating RN: Huel Coventry [id number] Number: Other Clinician: 1955-11-20 (59 y.o. Treating ROBSON, MICHAEL Date of Birth/Sex: Female) Physician/Extender: G Primary Care PATIENT, NO Physician: Referring Physician: Everette Rank in Treatment: 0 Fall Risk Assessment Items Have you had 2 or more falls in the last 12 monthso 0 No Have you had any fall that resulted in injury in the last 12 monthso 0 No FALL RISK ASSESSMENT: History of falling - immediate or  within 3 months 25 Yes Secondary diagnosis 0 No Ambulatory aid None/bed rest/wheelchair/nurse 0 No Crutches/cane/walker 0 No Furniture 30 Yes IV Access/Saline Lock 0 No Gait/Training Normal/bed rest/immobile 0 No Weak 10 Yes Impaired 0 No Mental Status Oriented to own ability 0 Yes Electronic Signature(s) Signed: 12/23/2015 4:49:25 PM By: Elliot Gurney, RN, [name], [name], [name] Entered By: Elliot Gurney, RN, [name], Kim on 12/23/2015 15:45:15 [name], [name] ([id number]) -------------------------------------------------------------------------------- Foot Assessment Details [name] Date of Service: 12/23/2015 3:00 PM Patient Name: [name] Account Number: [id number] Medical Record Treating RN: Huel Coventry [id number] Number: Other Clinician: [birth year] (59 y.o. Treating ROBSON, MICHAEL Date of Birth/Sex: Female) Physician/Extender: G Primary Care PATIENT, NO Physician: Referring Physician: Phineas Semen Weeks in Treatment: 0 Foot Assessment Items Site Locations + = Sensation present, - = Sensation absent, C = Callus, U = Ulcer R = Redness, W = Warmth, M = Maceration, PU = Pre-ulcerative lesion F = Fissure, S = Swelling, D = Dryness Assessment Right: Left: Other Deformity: No  No Prior Foot Ulcer: No No Prior Amputation: No No Charcot Joint: No No Ambulatory Status: Ambulatory Without Help Gait: Academic librarian Signature(s) Signed: 12/23/2015 4:49:25 PM By: Elliot Gurney, RN, [name], [name], [name] Entered By: Elliot Gurney, RN, [name], Kim on 12/23/2015 15:46:20 Ranta, [name] ([id number]) Bazaldua, [name] ([id number]) -------------------------------------------------------------------------------- Nutrition Risk Assessment Details [name] Date of Service: 12/23/2015 3:00 PM Patient Name: [name] Account Number: [id number] Medical Record Treating RN: Huel Coventry [id number] Number: Other Clinician: 04-17-1956 (59 y.o. Treating ROBSON, MICHAEL Date of Birth/Sex: Female) Physician/Extender: G Primary Care PATIENT, NO Physician: Referring Physician: Phineas Semen Weeks in Treatment: 0 Height (in): 62 Weight (lbs): 170 Body Mass Index (BMI): 31.1 Nutrition Risk Assessment Items NUTRITION RISK SCREEN: I have an illness or condition that made me change the kind and/or 0 No amount of food I eat I eat fewer than two meals per day 0 No I eat few fruits and vegetables, or milk products 2 Yes I have three or more drinks of beer, liquor or wine almost every day 0 No I have tooth or mouth problems that make it hard for me to eat 0 No I don't always have enough money to buy the food I need 0 No I eat alone most of the time 1 Yes I take three or more different prescribed or over-the-counter drugs a 0 No day Without wanting to, I have lost or gained 10 pounds in the last six 0 No months I am not always physically able to shop, cook and/or feed myself 0 No Nutrition Protocols Good Risk Protocol Provide education on Moderate Risk Protocol 0 nutrition Electronic Signature(s) Signed: 12/23/2015 4:49:25 PM By: Elliot Gurney, RN, [name], [name], [name] Entered By: Elliot Gurney, RN, [name], Kim on 12/23/2015 15:45:47

## 2015-12-24 NOTE — Progress Notes (Addendum)
[name] ([id number]) Visit Report for 12/23/2015 Chief Complaint Document Details Boisselle, Sameka Date of Service: 12/23/2015 3:00 PM Patient Name: [name] Account Number: [id number] Medical Record Treating RN: Huel Coventry [id number] Number: Other Clinician: 10-Jun-1956 (59 y.o. Treating ROBSON, MICHAEL Date of Birth/Sex: Female) Physician/Extender: G Primary Care PATIENT, NO Physician: Referring Physician: Everette Rank in Treatment: 0 Information Obtained from: Patient Chief Complaint Patient is here for review of a fairly substantial wound on her right medial lower leg that is been present for the last week Electronic Signature(s) Signed: 12/24/2015 7:54:52 AM By: [name] Entered By: Baltazar Najjar on 12/23/2015 16:13:21 [name] ([id number]) -------------------------------------------------------------------------------- HPI Details [name] Date of Service: 12/23/2015 3:00 PM Patient Name: [name] Account Number: [id number] Medical Record Treating RN: Huel Coventry [id number] Number: Other Clinician: 1955/11/26 (60 y.o. Treating ROBSON, MICHAEL Date of Birth/Sex: Female) Physician/Extender: G Primary Care PATIENT, NO Physician: Referring Physician: Everette Rank in Treatment: 0 History of Present Illness HPI Description: 12/23/15; this is a 60 year old lady who recently relocated back to [place] from [place]. He is staying with a family friend previously lived with a daughter in [place]. 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 She is on chronic prednisone she is not a diabetic. Dose of prednisone is 10 mg. The patient tells me that roughly a week ago she developed a fairly substantial blister over this area that then ruptured. She was seen in the emergency room on 5/7 an x-ray of the leg was negative she was put on Septra although I don't think  any cultures were done. She also tells me she has had chronic edema in her legs for a number of years. She had a similar presentation to currently in the right lateral lower leg roughly a year ago that she was able to heal on her own. As noted she has systemic lupus but does not have lupus nephritis according to the patient. She has a lot of edema in her lower legs. The ABI's could not be obtained. Electronic Signature(s) Signed: 12/24/2015 7:54:52 AM By: [name] Entered By: Baltazar Najjar on 12/23/2015 16:36:38 [name] ([id number]) -------------------------------------------------------------------------------- Physical Exam Details [name] Date of Service: 12/23/2015 3:00 PM Patient Name: [name] Account Number: [id number] Medical Record Treating RN: Huel Coventry [id number] Number: Other Clinician: 1955/10/14 (60 y.o. Treating ROBSON, MICHAEL Date of Birth/Sex: Female) Physician/Extender: G Primary Care PATIENT, NO Physician: Referring Physician: Phineas Semen Weeks in Treatment: 0 Constitutional Patient is hypertensive.. Pulse regular and within target range for patient.Marland Kitchen Respirations regular, non-labored and within target range.. Temperature is normal and within the target range for the patient.. Patient's appearance is neat and clean. Appears in no acute distress. Well nourished and well developed.. Cardiovascular Heart rhythm and rate regular, without murmur or gallop. No elevation of her jugular venous pressure. Femoral arteries without bruits and pulses strong.. In spite of significant edema in her feet I think I can feel her dorsalis pedis pulses bilaterally. I could not feel popliteal or posterior tibial pulses however. Edema present in both extremities which is really severe. Bilateral severe hemosiderin deposition. Gastrointestinal (GI) Abdomen is soft and non-distended without masses or tenderness. Bowel sounds active in all  quadrants.. No liver or spleen enlargement or tenderness.. Lymphatic None palpable in the popliteal or inguinal areas. Integumentary (Hair, Skin) No suspicious rashes. Neurological Right upper motor neuron facial weakness. Psychiatric No evidence of depression,  anxiety, or agitation. Calm, cooperative, and communicative. Appropriate interactions and affect.. Notes Wound exam; the patient has a really fairly extensive wound on the medial aspect of her right lower leg. There was weeping edema fluid coming out of this. Some surface necrotic eschar was seen over a portion of this wound but given the edema fluid and the pain I did not attempt to debridement this. I do not think that there is any surrounding soft tissue or skin infection at this point. She should complete her Septra. No additional cultures were done Electronic Signature(s) Signed: 12/24/2015 7:54:52 AM By: [name] Entered By: Baltazar Najjar on 12/23/2015 16:39:35 Bon[place]itz, Durward [name] ([id number]) -------------------------------------------------------------------------------- Physician Orders Details [name] Date of Service: 12/23/2015 3:00 PM Patient Name: [name] Account Number: [id number] Medical Record Treating RN: Huel Coventry [id number] Number: Other Clinician: 1955/12/12 (60 y.o. Treating ROBSON, MICHAEL Date of Birth/Sex: Female) Physician/Extender: G Primary Care PATIENT, NO Physician: Referring Physician: Everette Rank in Treatment: 0 Verbal / Phone Orders: Yes Clinician: Huel Coventry Read Back and Verified: Yes Diagnosis Coding Wound Cleansing o Clean wound with Normal Saline. Anesthetic Wound #1 Right,Medial Lower Leg o Topical Lidocaine 4% cream applied to wound bed prior to debridement Skin Barriers/Peri-Wound Care Wound #1 Right,Medial Lower Leg o Barrier cream Primary Wound Dressing Wound #1 Right,Medial Lower Leg o Aquacel Ag - or equivalent Secondary  Dressing Wound #1 Right,Medial Lower Leg o ABD pad o Drawtex - or equivalent Dressing Change Frequency Wound #1 Right,Medial Lower Leg o Three times weekly - Once in wound care center (Tuesday) Follow-up Appointments Wound #1 Right,Medial Lower Leg o Return Appointment in 1 week. o Nurse Visit as needed - If [facility] is not available, patient needs to return to wound care center for re- wrap. (Thursday and Monday) Edema Control o 3 Layer Compression System - Bilateral - Profore Lite Falkenstein, Scarlet L. ([id number]) Additional Orders / Instructions Wound #1 Right,Medial Lower Leg o Activity as tolerated Home Health Wound #1 Right,Medial Lower Leg o Initiate Home Health for Skilled Nursing - [facility] o Home Health Nurse may visit PRN to address patientos wound care needs. o FACE TO FACE ENCOUNTER: MEDICARE and MEDICAID PATIENTS: I certify that this patient is under my care and that I had a face-to-face encounter that meets the physician face-to-face encounter requirements with this patient on this date. The encounter with the patient was in whole or in part for the following MEDICAL CONDITION: (primary reason for Home Healthcare) MEDICAL NECESSITY: I certify, that based on my findings, NURSING services are a medically necessary home health service. HOME BOUND STATUS: I certify that my clinical findings support that this patient is homebound (i.e., Due to illness or injury, pt requires aid of supportive devices such as crutches, cane, wheelchairs, walkers, the use of special transportation or the assistance of another person to leave their place of residence. There is a normal inability to leave the home and doing so requires considerable and taxing effort. Other absences are for medical reasons / religious services and are infrequent or of short duration when for other reasons). o If current dressing causes regression in wound condition, may D/C ordered dressing  product/s and apply Normal Saline Moist Dressing daily until next Wound Healing Center / Other MD appointment. Notify Wound Healing Center of regression in wound condition at [phone]. o Please direct any NON-WOUND related issues/requests for orders to patient's Primary Care Physician Electronic Signature(s) Signed: 12/24/2015 4:50:28 PM By: Elliot Gurney, RN, [name], [name], [name] Signed:  12/24/2015 5:31:12 PM By: [name] Previous Signature: 12/23/2015 4:49:25 PM Version By: Elliot Gurney RN, [name], [name], [name] Previous Signature: 12/24/2015 7:54:52 AM Version By: [name] Entered By: Elliot Gurney RN, [name], Kim on 12/24/2015 16:25:49 Henard, Durward [name] ([id number]) -------------------------------------------------------------------------------- Problem List Details [name] Date of Service: 12/23/2015 3:00 PM Patient Name: [name] Account Number: [id number] Medical Record Treating RN: Huel Coventry [id number] Number: Other Clinician: 03-12-1956 (59 y.o. 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 Account  Number: [id number] Medical Record Treating RN: Huel Coventry [id number] Number: Other Clinician: 09-Sep-1955 (59 y.o. Treating ROBSON, MICHAEL Date of Birth/Sex: Female) Physician/Extender: G Primary Care PATIENT, NO Physician: Referring Physician: Everette Rank in Treatment: 0 Subjective Chief Complaint Information obtained from Patient Patient is here for review of a fairly substantial wound on her right medial lower leg that is been present for the last week History of Present Illness (HPI) 12/23/15; this is a 60 year old lady who recently relocated back to [place] from [place]. He is staying with a family friend previously lived with a daughter in [place]. 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 She is on chronic prednisone she is not a diabetic. Dose of prednisone is 10 mg. The patient tells me that roughly a week ago she developed a fairly substantial blister over this area that then ruptured. She was seen in the emergency room on 5/7 an x-ray of the leg was negative she was put on Septra although I don't think any cultures were done. She also tells me she has had chronic edema in her legs for a number of years. She had a similar presentation to currently in the right lateral lower leg roughly a year ago that she was able to heal on her own. As noted she has systemic lupus but does not have lupus nephritis according to the patient. She has a lot of edema in her lower legs. The ABI's could not be obtained. Wound History Patient presents with 1 open wound that has been present for approximately 1 week. Patient has been treating wound in the following manner: Abx-Bactrim, bandage for drainage. Laboratory tests have been performed in the last month. Patient reportedly has not tested positive for an antibiotic resistant organism. Patient reportedly has not tested positive for osteomyelitis. Patient  History Information obtained from Chart. Allergies No Known Drug Allergies MARANATHA, GROSSI ([id number]) Family History Cancer - Mother, Diabetes - Mother, Siblings, Hypertension - Mother, Stroke - Father, No family history of Heart Disease, Kidney Disease, Lung Disease, Seizures, Thyroid Problems. Social History Former smoker, Marital Status - Divorced, Alcohol Use - Never, Drug Use - No History, Caffeine Use - Never. Medical History Eyes Denies history of Cataracts, Glaucoma, Optic Neuritis Ear/Nose/Mouth/Throat Denies history of Chronic sinus problems/congestion, Middle ear problems Hematologic/Lymphatic Denies history of Anemia, Hemophilia, Human Immunodeficiency  Virus, Lymphedema, Sickle Cell Disease Respiratory Denies history of Aspiration, Asthma, Chronic Obstructive Pulmonary Disease (COPD), Pneumothorax, Sleep Apnea, Tuberculosis Cardiovascular Denies history of Angina, Arrhythmia, Congestive Heart Failure, Coronary Artery Disease, Deep Vein Thrombosis, Hypertension, Hypotension, Myocardial Infarction, Peripheral Arterial Disease, Peripheral Venous Disease, Phlebitis, Vasculitis Gastrointestinal Denies history of Cirrhosis , Colitis, Crohn s, Hepatitis A, Hepatitis B, Hepatitis C Endocrine Denies history of Type I Diabetes, Type II Diabetes Genitourinary Denies history of End Stage Renal Disease Immunological Denies history of Lupus Erythematosus, Raynaud s, Scleroderma Integumentary (Skin) Denies history of History of Burn, History of pressure wounds Musculoskeletal Denies history of Gout, Rheumatoid Arthritis, Osteoarthritis, Osteomyelitis Neurologic Denies history of Dementia, Neuropathy, Quadriplegia, Paraplegia, Seizure Disorder Oncologic Denies history of Received Chemotherapy, Received Radiation Psychiatric Denies history of Anorexia/bulimia, Confinement Anxiety Medical And Surgical History Notes Constitutional Symptoms (General Health) History of Lupus  and stroke; currently has bronchitis; Respiratory Bronchitis Review of Systems (ROS) Constitutional Symptoms (General Health) The patient has no complaints or symptoms. Eyes Skufca, Ronnette L. ([id number]) The patient has no complaints or symptoms. Ear/Nose/Mouth/Throat The patient has no complaints or symptoms. Hematologic/Lymphatic The patient has no complaints or symptoms. Respiratory The patient has no complaints or symptoms. Cardiovascular Complains or has symptoms of LE edema. Denies complaints or symptoms of Chest pain. Gastrointestinal The patient has no complaints or symptoms. Endocrine The patient has no complaints or symptoms. Genitourinary The patient has no complaints or symptoms. Immunological The patient has no complaints or symptoms. Integumentary (Skin) Complains or has symptoms of Wounds, Bleeding or bruising tendency. Denies complaints or symptoms of Breakdown, Swelling. Musculoskeletal The patient has no complaints or symptoms. Neurologic The patient has no complaints or symptoms. Oncologic The patient has no complaints or symptoms. Psychiatric The patient has no complaints or symptoms. Objective Constitutional Patient is hypertensive.. Pulse regular and within target range for patient.Marland Kitchen Respirations regular, non-labored and within target range.. Temperature is normal and within the target range for the patient.. Patient's appearance is neat and clean. Appears in no acute distress. Well nourished and well developed.. Vitals Time Taken: 3:25 PM, Height: 62 in, Source: Measured, Weight: 170 lbs, Source: Stated, BMI: 31.1, Temperature: 98.3 F, Pulse: 107 bpm, Respiratory Rate: 18 breaths/min, Blood Pressure: 152/90 mmHg. Cardiovascular Heart rhythm and rate regular, without murmur or gallop. No elevation of her jugular venous pressure. Femoral arteries without bruits and pulses strong.. In spite of significant edema in her feet I think I can feel her  dorsalis pedis pulses bilaterally. I could not feel popliteal or posterior tibial pulses however. Edema Kroh, Joletta L. ([id number]) present in both extremities which is really severe. Bilateral severe hemosiderin deposition. Gastrointestinal (GI) Abdomen is soft and non-distended without masses or tenderness. Bowel sounds active in all quadrants.. No liver or spleen enlargement or tenderness.. Lymphatic None palpable in the popliteal or inguinal areas. Neurological Right upper motor neuron facial weakness. Psychiatric No evidence of depression, anxiety, or agitation. Calm, cooperative, and communicative. Appropriate interactions and affect.. General Notes: Wound exam; the patient has a really fairly extensive wound on the medial aspect of her right lower leg. There was weeping edema fluid coming out of this. Some surface necrotic eschar was seen over a portion of this wound but given the edema fluid and the pain I did not attempt to debridement this. I do not think that there is any surrounding soft tissue or skin infection at this point. She should complete her Septra. No additional cultures were done Integumentary (Hair, Skin) No suspicious rashes. Wound #1  status is Open. Original cause of wound was Blister. The wound is located on the Right,Medial Lower Leg. The wound measures 6cm length x 5.5cm width x 0.1cm depth; 25.918cm^2 area and 2.592cm^3 volume. The wound is limited to skin breakdown. There is no tunneling or undermining noted. There is a large amount of serosanguineous drainage noted. The wound margin is flat and intact. There is medium (34-66%) red, friable granulation within the wound bed. There is a medium (34-66%) amount of necrotic tissue within the wound bed including Adherent Slough. The periwound skin appearance exhibited: Moist, Hemosiderin Staining. The periwound skin appearance did not exhibit: Callus, Crepitus, Excoriation, Fluctuance, Friable, Induration,  Localized Edema, Rash, Scarring, Dry/Scaly, Maceration, Atrophie Blanche, Cyanosis, Ecchymosis, Mottled, Pallor, Rubor, Erythema. Periwound temperature was noted as No Abnormality. The periwound has tenderness on palpation. Assessment Active Problems ICD-10 I87.331 - 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 lower extremity I87.322 - Chronic venous hypertension (idiopathic) with inflammation of left lower extremity M32.10 - Systemic lupus erythematosus, organ or system involvement unspecified Mukherjee, Jamaiyah L. ([id number]) Plan Wound Cleansing: Clean wound with Normal Saline. Anesthetic: Wound #1 Right,Medial Lower Leg: Topical Lidocaine 4% cream applied to wound bed prior to debridement Skin Barriers/Peri-Wound Care: Wound #1 Right,Medial Lower Leg: Barrier cream Primary Wound Dressing: Wound #1 Right,Medial Lower Leg: Aquacel Ag Secondary Dressing: Wound #1 Right,Medial Lower Leg: Drawtex ABD pad Dressing Change Frequency: Wound #1 Right,Medial Lower Leg: Three times weekly - Once in wound care center Follow-up Appointments: Wound #1 Right,Medial Lower Leg: Return Appointment in 1 week. Edema Control: Other: - Profore lite Additional Orders / Instructions: Wound #1 Right,Medial Lower Leg: Activity as tolerated Home Health: Wound #1 Right,Medial Lower Leg: Initiate Home Health for Skilled Nursing Home Health Nurse may visit PRN to address patient s wound care needs. FACE TO FACE ENCOUNTER: MEDICARE and MEDICAID PATIENTS: I certify that this patient is under my care and that I had a face-to-face encounter that meets the physician face-to-face encounter requirements with this patient on this date. The encounter with the patient was in whole or in part for the following MEDICAL CONDITION: (primary reason for Home Healthcare) MEDICAL NECESSITY: I certify, that based on my findings, NURSING services are a medically necessary home health  service. HOME BOUND STATUS: I certify that my clinical findings support that this patient is homebound (i.e., Due to illness or injury, pt requires aid of supportive devices such as crutches, cane, wheelchairs, walkers, the use of special transportation or the assistance of another person to leave their place of residence. There is a normal inability to leave the home and doing so requires considerable and taxing effort. Other absences are for medical reasons / religious services and are infrequent or of short duration when for other reasons). If current dressing causes regression in wound condition, may D/C ordered dressing product/s and apply Normal Saline Moist Dressing daily until next Wound Healing Center / Other MD appointment. Notify Wound Healing Center of regression in wound condition at [phone]. Please direct any NON-WOUND related issues/requests for orders to patient's Primary Care Physician RAVEN, GROEBNER ([id number]) #1 the patient will need Aquacel Ag, drawtex, ABD pads under a Profore light wrap. I was somewhat concerned about using a more aggressive compression giving the cold feeling of her toes bilaterally even though I could feel a dorsalis pedis pulse #2 given the degree of drainage will have home health out twice a week to drains this dressing. The patient is certainly homebound #3 I  will see if any lab work was done during her trip to the ER. Specifically her kidney function although the patient denies a history of lupus nephritis. All her medical workup was done in [place] she has not of these records with her. #4 I see no signs of systemic fluid overload. An x-ray of her leg was done in the ER there was really no relevant findings. Electronic Signature(s) Signed: 12/24/2015 7:54:52 AM By: [name] Entered By: Baltazar Najjar on 12/23/2015 16:42:06 Paradise, Durward [name]  ([id number]) -------------------------------------------------------------------------------- ROS/PFSH Details [name] Date of Service: 12/23/2015 3:00 PM Patient Name: [name] Account Number: [id number] Medical Record Treating RN: Huel Coventry [id number] Number: Other Clinician: 1956/01/24 (59 y.o. Treating ROBSON, MICHAEL Date of Birth/Sex: Female) Physician/Extender: G Primary Care PATIENT, NO Physician: Referring Physician: Phineas Semen Weeks in Treatment: 0 Information Obtained From Chart Wound History Do you currently have one or more open woundso Yes How many open wounds do you currently haveo 1 Approximately how long have you had your woundso 1 week How have you been treating your wound(s) until nowo Abx-Bactrim, bandage for drainage Has your wound(s) ever healed and then re-openedo No Have you had any lab work done in the past montho Yes Who ordered the lab work doneo [facility] ED Have you tested positive for an antibiotic resistant organism No (MRSA, VRE)o Have you tested positive for osteomyelitis (bone infection)o No Hematologic/Lymphatic Complaints and Symptoms: No Complaints or Symptoms Complaints and Symptoms: Negative for: Bleeding / Clotting Disorders Medical History: Negative for: Anemia; Hemophilia; Human Immunodeficiency Virus; Lymphedema; Sickle Cell Disease Respiratory Complaints and Symptoms: No Complaints or Symptoms Complaints and Symptoms: Negative for: Chronic or frequent coughs; Shortness of Breath Medical History: Negative for: Aspiration; Asthma; Chronic Obstructive Pulmonary Disease (COPD); Pneumothorax; Sleep Apnea; Tuberculosis Past Medical History Notes: TARITA, DEROSIA ([id number]) Bronchitis Cardiovascular Complaints and Symptoms: Positive for: LE edema Negative for: Chest pain Medical History: Negative for: Angina; Arrhythmia; Congestive Heart Failure; Coronary Artery Disease; Deep Vein Thrombosis; Hypertension;  Hypotension; Myocardial Infarction; Peripheral Arterial Disease; Peripheral Venous Disease; Phlebitis; Vasculitis Gastrointestinal Complaints and Symptoms: No Complaints or Symptoms Complaints and Symptoms: Negative for: Frequent diarrhea; Nausea; Vomiting Medical History: Negative for: Cirrhosis ; Colitis; Crohnos; Hepatitis A; Hepatitis B; Hepatitis C Integumentary (Skin) Complaints and Symptoms: Positive for: Wounds; Bleeding or bruising tendency Negative for: Breakdown; Swelling Medical History: Negative for: History of Burn; History of pressure wounds Constitutional Symptoms (General Health) Complaints and Symptoms: No Complaints or Symptoms Medical History: Past Medical History Notes: History of Lupus and stroke; currently has bronchitis; Eyes Complaints and Symptoms: No Complaints or Symptoms Medical History: Negative for: Cataracts; Glaucoma; Optic Neuritis Ear/Nose/Mouth/Throat TRESE, JAEGERS ([id number]) Complaints and Symptoms: No Complaints or Symptoms Medical History: Negative for: Chronic sinus problems/congestion; Middle ear problems Endocrine Complaints and Symptoms: No Complaints or Symptoms Medical History: Negative for: Type I Diabetes; Type II Diabetes Genitourinary Complaints and Symptoms: No Complaints or Symptoms Medical History: Negative for: End Stage Renal Disease Immunological Complaints and Symptoms: No Complaints or Symptoms Medical History: Negative for: Lupus Erythematosus; Raynaudos; Scleroderma Musculoskeletal Complaints and Symptoms: No Complaints or Symptoms Medical History: Negative for: Gout; Rheumatoid Arthritis; Osteoarthritis; Osteomyelitis Neurologic Complaints and Symptoms: No Complaints or Symptoms Medical History: Negative for: Dementia; Neuropathy; Quadriplegia; Paraplegia; Seizure Disorder Oncologic Complaints and Symptoms: No Complaints or Symptoms Medical History: VIDUSHI, SABEY ([id number]) Negative  for: Received Chemotherapy; Received Radiation Psychiatric Complaints and Symptoms: No Complaints or Symptoms Medical History: Negative for: Anorexia/bulimia; Confinement Anxiety Family and Social  History Cancer: Yes - Mother; Diabetes: Yes - Mother, Siblings; Heart Disease: No; Hypertension: Yes - Mother; Kidney Disease: No; Lung Disease: No; Seizures: No; Stroke: Yes - Father; Thyroid Problems: No; Former smoker; Marital Status - Divorced; Alcohol Use: Never; Drug Use: No History; Caffeine Use: Never; Advanced Directives: No; Patient does not want information on Advanced Directives; Living Will: No; Medical Power of Attorney: No Electronic Signature(s) Signed: 12/23/2015 4:49:25 PM By: Elliot Gurney RN, [name], [name], [name] Signed: 12/24/2015 7:54:52 AM By: [name] Entered By: Elliot Gurney RN, [name], Kim on 12/23/2015 15:42:58 Plitt, Durward [name] ([id number]) -------------------------------------------------------------------------------- SuperBill Details Merkin, Lourdez Date of Service: 12/23/2015 Patient Name: [name] Account Number: [id number] Medical Record Treating RN: Huel Coventry [id number] Number: Other Clinician: [birth year] (59 y.o. 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 I87.322 Chronic venous hypertension (idiopathic) with inflammation of left lower extremity M32.10 Systemic lupus erythematosus, organ or system involvement unspecified Facility Procedures CPT4 Code: 41287867 Description: 99214 - WOUND CARE VISIT-LEV 4 EST PT Modifier: Quantity: 1 Physician Procedures CPT4: Description Modifier Quantity Code 6720947 [id number] - WC PHYS LEVEL 4 - NEW PT 1 ICD-10 Description Diagnosis I87.331 Chronic  venous hypertension (idiopathic) with ulcer and inflammation of right lower extremity Electronic Signature(s) Signed: 12/23/2015 4:49:25 PM By: Elliot Gurney RN, [name], [name], [name] Signed: 12/24/2015 7:54:52 AM By: [name] Entered By: Elliot Gurney, RN, [name], Kim on 12/23/2015 16:43:28

## 2015-12-24 NOTE — Progress Notes (Signed)
[name] ([id number]) Visit Report for 12/23/2015 Allergy List Details Patient Name: [name], [name] Date of Service: 12/23/2015 3:00 PM Medical Record Patient Account Number: [id number] Number: Treating RN: Huel Coventry [birth year] (59 y.o. Other Clinician: Date of Birth/Sex: Female) Treating ROBSON, MICHAEL Primary Care Physician: PATIENT, NO Physician/Extender: G Referring Physician: Phineas Semen Weeks in Treatment: 0 Allergies Active Allergies No Known Drug Allergies Allergy Notes Electronic Signature(s) Signed: 12/23/2015 4:49:25 PM By: Elliot Gurney, RN, [name], [name], [name] Entered By: Elliot Gurney, RN, [name], Kim on 12/23/2015 15:38:32 [name], [name] ([id number]) -------------------------------------------------------------------------------- Arrival Information Details Patient Name: [name], [name] Date of Service: 12/23/2015 3:00 PM Medical Record Patient Account Number: [id number] Number: Treating RN: Huel Coventry 11/18/1955 (59 y.o. Other Clinician: Date of Birth/Sex: Female) Treating ROBSON, MICHAEL Primary Care Physician: PATIENT, NO Physician/Extender: G Referring Physician: Everette Rank in Treatment: 0 Visit Information Patient Arrived: Wheel Chair Arrival Time: 15:22 Accompanied By: friend Transfer Assistance: None Patient Identification Verified: Yes Secondary Verification Process Yes Completed: Patient Requires Transmission-Based No Precautions: Patient Has Alerts: No Electronic Signature(s) Signed: 12/23/2015 4:49:25 PM By: Elliot Gurney, RN, [name], [name], [name] Entered By: Elliot Gurney, RN, [name], Kim on 12/23/2015 15:24:51 [name], [name] ([id number]) -------------------------------------------------------------------------------- Clinic Level of Care Assessment Details Patient Name: [name], [name] Date of Service: 12/23/2015 3:00 PM Medical Record Patient Account Number: [id number] Number: Treating RN: Huel Coventry [birth year]  (59 y.o. Other Clinician: Date of Birth/Sex: Female) Treating ROBSON, MICHAEL Primary Care Physician: PATIENT, NO Physician/Extender: G Referring Physician: Everette Rank in Treatment: 0 Clinic Level of Care Assessment Items TOOL 2 Quantity Score []  - Use when only an EandM is performed on the INITIAL visit 0 ASSESSMENTS - Nursing Assessment / Reassessment X - General Physical Exam (combine w/ comprehensive assessment (listed just 1 20 below) when performed on new pt. evals) X - Comprehensive Assessment (HX, ROS, Risk Assessments, Wounds Hx, etc.) 1 25 ASSESSMENTS - Wound and Skin Assessment / Reassessment X - Simple Wound Assessment / Reassessment - one wound 1 5 []  - Complex Wound Assessment / Reassessment - multiple wounds 0 []  - Dermatologic / Skin Assessment (not related to wound area) 0 ASSESSMENTS - Ostomy and/or Continence Assessment and Care []  - Incontinence Assessment and Management 0 []  - Ostomy Care Assessment and Management (repouching, etc.) 0 PROCESS - Coordination of Care X - Simple Patient / Family Education for ongoing care 1 15 []  - Complex (extensive) Patient / Family Education for ongoing care 0 X - Staff obtains , Records, Test Results / Process Orders 1 10 []  - Staff telephones HHA, Nursing Homes / Clarify orders / etc 0 []  - Routine Transfer to another Facility (non-emergent condition) 0 []  - Routine Hospital Admission (non-emergent condition) 0 X - New Admissions / / Ordering NPWT, Apligraf, etc. 1 15 []  - Emergency Hospital Admission (emergent condition) 0 [name], [name] ( ) X - Simple Discharge Coordination 1 10 []  - Complex (extensive) Discharge Coordination 0 PROCESS - Special Needs []  - Pediatric / Minor Patient Management 0 []  - Isolation Patient Management 0 []  - Hearing / Language / Visual special needs 0 []  - Assessment of Community assistance (transportation, D/C planning, etc.) 0 []  -  Additional assistance / Altered mentation 0 []  - Support Surface(s) Assessment (bed, cushion, seat, etc.) 0 INTERVENTIONS - Wound Cleansing / Measurement X - Wound Imaging (photographs - any number of wounds) 1 5 []  - Wound Tracing (instead of photographs) 0 X - Simple Wound Measurement -  one wound 1 5 []  - Complex Wound Measurement - multiple wounds 0 X - Simple Wound Cleansing - one wound 1 5 []  - Complex Wound Cleansing - multiple wounds 0 INTERVENTIONS - Wound Dressings []  - Small Wound Dressing one or multiple wounds 0 X - Medium Wound Dressing one or multiple wounds 2 15 []  - Large Wound Dressing one or multiple wounds 0 []  - Application of Medications - injection 0 INTERVENTIONS - Miscellaneous []  - External ear exam 0 []  - Specimen Collection (cultures, biopsies, blood, body fluids, etc.) 0 []  - Specimen(s) / Culture(s) sent or taken to Lab for analysis 0 []  - Patient Transfer (multiple staff / Nurse, adult / Similar devices) 0 []  - Simple Staple / Suture removal (25 or less) 0 [name], [name] ([id number]) []  - Complex Staple / Suture removal (26 or more) 0 []  - Hypo / Hyperglycemic Management (close monitor of Blood Glucose) 0 []  - Ankle / Brachial Index (ABI) - do not check if billed separately 0 Has the patient been seen at the hospital within the last three years: Yes Total Score: 145 Level Of Care: New/Established - Level 4 Electronic Signature(s) Signed: 12/23/2015 4:49:25 PM By: Elliot Gurney, RN, [name], [name], [name] Entered By: Elliot Gurney, RN, [name], Kim on 12/23/2015 16:43:19 [name] ([id number]) -------------------------------------------------------------------------------- Encounter Discharge Information Details Patient Name: [name], [name] Date of Service: 12/23/2015 3:00 PM Medical Record Patient Account Number: [id number] Number: Treating RN: Huel Coventry [birth year] (59 y.o. Other Clinician: Date of Birth/Sex: Female) Treating ROBSON, MICHAEL Primary  Care Physician: PATIENT, NO Physician/Extender: G Referring Physician: Everette Rank in Treatment: 0 Encounter Discharge Information Items Discharge Pain Level: 0 Discharge Condition: Stable Ambulatory Status: Wheelchair Discharge Destination: Home Transportation: Private Auto Accompanied By: caregiver Schedule Follow-up Appointment: Yes Medication Reconciliation completed Yes and provided to Patient/Care Brinly Maietta: Provided on Clinical Summary of Care: 12/23/2015 Form Type Recipient Paper Patient GC Electronic Signature(s) Signed: 12/23/2015 4:49:25 PM By: Elliot Gurney, RN, [name], [name], [name] Previous Signature: 12/23/2015 4:29:42 PM Version By: Gwenlyn Perking Entered By: Elliot Gurney RN, [name], Kim on 12/23/2015 16:46:14 Larin, [name] ([id number]) -------------------------------------------------------------------------------- Lower Extremity Assessment Details Patient Name: Costilow, Francella L. Date of Service: 12/23/2015 3:00 PM Medical Record Patient Account Number: [id number] Number: Treating RN: Huel Coventry 06/28/1956 (59 y.o. Other Clinician: Date of Birth/Sex: Female) Treating ROBSON, MICHAEL Primary Care Physician: PATIENT, NO Physician/Extender: G Referring Physician: Phineas Semen Weeks in Treatment: 0 Edema Assessment Assessed: [Left: No] [Right: No] E[Left: dema] [Right: :] Calf Left: Right: Point of Measurement: 31 cm From Medial Instep cm 46 cm Ankle Left: Right: Point of Measurement: 11 cm From Medial Instep cm 29 cm Vascular Assessment Pulses: Posterior Tibial Palpable: [Right:No] Doppler: [Right:Monophasic] Dorsalis Pedis Palpable: [Right:No] Doppler: [Right:Monophasic] Extremity colors, hair growth, and conditions: Extremity Color: [Right:Hyperpigmented] Hair Growth on Extremity: [Right:No] Temperature of Extremity: [Right:Warm] Capillary Refill: [Right:> 3 seconds] Toe Nail Assessment Left: Right: Thick: No Discolored: No Deformed:  No Improper Length and Hygiene: No Notes [name], [name] ([id number]) Unable to complete ABI due to tenderness and wound location. Electronic Signature(s) Signed: 12/23/2015 4:49:25 PM By: Elliot Gurney, RN, [name], [name], [name] Entered By: Elliot Gurney, RN, [name], Kim on 12/23/2015 15:36:35 [name], [name] ([id number]) -------------------------------------------------------------------------------- Multi Wound Chart Details Patient Name: Crichlow, Addelynn L. Date of Service: 12/23/2015 3:00 PM Medical Record Patient Account Number: [id number] Number: Treating RN: Huel Coventry October 31, 1955 (59 y.o. Other Clinician: Date of Birth/Sex: Female) Treating ROBSON, MICHAEL Primary Care Physician: PATIENT, NO Physician/Extender:  G Referring Physician: Everette Rank in Treatment: 0 Vital Signs Height(in): 62 Pulse(bpm): 107 Weight(lbs): 170 Blood Pressure 152/90 (mmHg): Body Mass Index(BMI): 31 Temperature(F): 98.3 Respiratory Rate 18 (breaths/min): Photos: [1:No Photos] [N/A:N/A] Wound Location: [1:Right Lower Leg - Medial] [N/A:N/A] Wounding Event: [1:Blister] [N/A:N/A] Primary Etiology: [1:Venous Leg Ulcer] [N/A:N/A] Date Acquired: [1:12/15/2015] [N/A:N/A] Weeks of Treatment: [1:0] [N/A:N/A] Wound Status: [1:Open] [N/A:N/A] Measurements L x W x D 6x5.5x0.1 [N/A:N/A] (cm) Area (cm) : [1:25.918] [N/A:N/A] Volume (cm) : [1:2.592] [N/A:N/A] Classification: [1:Partial Thickness] [N/A:N/A] Exudate Amount: [1:Large] [N/A:N/A] Exudate Type: [1:Serosanguineous] [N/A:N/A] Exudate Color: [1:red, brown] [N/A:N/A] Wound Margin: [1:Flat and Intact] [N/A:N/A] Granulation Amount: [1:Medium (34-66%)] [N/A:N/A] Granulation Quality: [1:Red, Friable] [N/A:N/A] Necrotic Amount: [1:Medium (34-66%)] [N/A:N/A] Exposed Structures: [1:Fascia: No Fat: No Tendon: No Muscle: No Joint: No Bone: No Limited to Skin Breakdown] [N/A:N/A] Epithelialization: [1:None] [N/A:N/A] Periwound Skin Texture:  Edema: No N/A N/A Excoriation: No Induration: No Callus: No Crepitus: No Fluctuance: No Friable: No Rash: No Scarring: No Periwound Skin Moist: Yes N/A N/A Moisture: Maceration: No Dry/Scaly: No Periwound Skin Color: Hemosiderin Staining: Yes N/A N/A Atrophie Blanche: No Cyanosis: No Ecchymosis: No Erythema: No Mottled: No Pallor: No Rubor: No Temperature: No Abnormality N/A N/A Tenderness on Yes N/A N/A Palpation: Wound Preparation: Ulcer Cleansing: N/A N/A Rinsed/Irrigated with Saline Topical Anesthetic Applied: Other: lidocaine 4% Treatment Notes Electronic Signature(s) Signed: 12/23/2015 4:49:25 PM By: Elliot Gurney, RN, [name], [name], [name] Entered By: Elliot Gurney, RN, [name], Kim on 12/23/2015 15:57:51 [name], [name] ([id number]) -------------------------------------------------------------------------------- Multi-Disciplinary Care Plan Details Patient Name: [name], [name] Date of Service: 12/23/2015 3:00 PM Medical Record Patient Account Number: [id number] Number: Treating RN: Huel Coventry 08-03-1956 (59 y.o. Other Clinician: Date of Birth/Sex: Female) Treating ROBSON, MICHAEL Primary Care Physician: PATIENT, NO Physician/Extender: G Referring Physician: Everette Rank in Treatment: 0 Active Inactive Abuse / Safety / Falls / Self Care Management Nursing Diagnoses: Impaired physical mobility Potential for falls Goals: Patient will remain injury free Date Initiated: 12/23/2015 Goal Status: Active Interventions: Assess fall risk on admission and as needed Notes: Nutrition Nursing Diagnoses: Imbalanced nutrition Goals: Patient/caregiver verbalizes understanding of need to maintain therapeutic glucose control per primary care physician Date Initiated: 12/23/2015 Goal Status: Active Interventions: Provide education on nutrition Notes: Orientation to the Wound Care Program Nursing Diagnoses: Knowledge deficit related to the wound healing center  program KASSIDIE, HENDRIKS ([id number]) Goals: Patient/caregiver will verbalize understanding of the Wound Healing Center Program Date Initiated: 12/23/2015 Goal Status: Active Interventions: Provide education on orientation to the wound center Notes: Venous Leg Ulcer Nursing Diagnoses: Potential for venous Insuffiency (use before diagnosis confirmed) Goals: Patient will maintain optimal edema control Date Initiated: 12/23/2015 Goal Status: Active Interventions: Provide education on venous insufficiency Notes: Wound/Skin Impairment Nursing Diagnoses: Impaired tissue integrity Goals: Ulcer/skin breakdown will heal within 14 weeks Date Initiated: 12/23/2015 Goal Status: Active Interventions: Assess ulceration(s) every visit Notes: Electronic Signature(s) Signed: 12/23/2015 4:49:25 PM By: Elliot Gurney, RN, [name], [name], [name] Entered By: Elliot Gurney, RN, [name], Kim on 12/23/2015 15:56:55 [name], [name] ([id number]) -------------------------------------------------------------------------------- Pain Assessment Details Patient Name: Parthasarathy, Jan L. Date of Service: 12/23/2015 3:00 PM Medical Record Patient Account Number: [id number] Number: Treating RN: Huel Coventry [birth year] (59 y.o. Other Clinician: Date of Birth/Sex: Female) Treating ROBSON, MICHAEL Primary Care Physician: PATIENT, NO Physician/Extender: G Referring Physician: Everette Rank in Treatment: 0 Active Problems Location of Pain Severity and Description of Pain Patient Has Paino No Site Locations Pain Management and Medication Current Pain Management: Electronic Signature(s) Signed:  12/23/2015 4:49:25 PM By: Elliot Gurney, RN, [name], [name], [name] Entered By: Elliot Gurney, RN, [name], Kim on 12/23/2015 15:24:58 [name], [name] ([id number]) -------------------------------------------------------------------------------- Patient/Caregiver Education Details Patient Name: Lundstrom, Misako L. Date of Service: 12/23/2015 3:00  PM Medical Record Patient Account Number: [id number] Number: Treating RN: Huel Coventry Jun 21, 1956 (59 y.o. Other Clinician: Date of Birth/Gender: Female) Treating ROBSON, MICHAEL Primary Care Physician: PATIENT, NO Physician/Extender: G Referring Physician: Everette Rank in Treatment: 0 Education Assessment Education Provided To: Patient and Caregiver Education Topics Provided Venous: Handouts: Controlling Swelling with Multilayered Compression Wraps, Other: keep wraps dry Methods: Demonstration, Explain/Verbal Responses: State content correctly Welcome To The Wound [facility] Center: Handouts: Welcome To The [facility] Methods: Demonstration, Explain/Verbal Responses: State content correctly Electronic Signature(s) Signed: 12/23/2015 4:49:25 PM By: Elliot Gurney, RN, [name], [name], [name] Entered By: Elliot Gurney, RN, [name], Kim on 12/23/2015 16:48:58 Mccoll, [name] ([id number]) -------------------------------------------------------------------------------- Wound Assessment Details Patient Name: Glade, Ronne L. Date of Service: 12/23/2015 3:00 PM Medical Record Patient Account Number: [id number] Number: Treating RN: Huel Coventry 04-22-1956 (59 y.o. Other Clinician: Date of Birth/Sex: Female) Treating ROBSON, MICHAEL Primary Care Physician: PATIENT, NO Physician/Extender: G Referring Physician: Phineas Semen Weeks in Treatment: 0 Wound Status Wound Number: 1 Primary Etiology: Venous Leg Ulcer Wound Location: Right Lower Leg - Medial Wound Status: Open Wounding Event: Blister Date Acquired: 12/15/2015 Weeks Of Treatment: 0 Clustered Wound: No Photos Photo Uploaded By: Elliot Gurney, RN, [name], Kim on 12/23/2015 17:43:37 Wound Measurements Length: (cm) 6 % Reduction in Ar Width: (cm) 5.5 % Reduction in Vo Depth: (cm) 0.1 Epithelialization Area: (cm) 25.918 Tunneling: Volume: (cm) 2.592 Undermining: ea: lume: : None No No Wound  Description Classification: Partial Thickness Foul Odor After Wound Margin: Flat and Intact Exudate Amount: Large Exudate Type: Serosanguineous Exudate Color: red, brown Cleansing: No Wound Bed Granulation Amount: Medium (34-66%) Exposed Structure Granulation Quality: Red, Friable Fascia Exposed: No Necrotic Amount: Medium (34-66%) Fat Layer Exposed: No Cravens, Laressa L. ([id number]) Necrotic Quality: Adherent Slough Tendon Exposed: No Muscle Exposed: No Joint Exposed: No Bone Exposed: No Limited to Skin Breakdown Periwound Skin Texture Texture Color No Abnormalities Noted: No No Abnormalities Noted: No Callus: No Atrophie Blanche: No Crepitus: No Cyanosis: No Excoriation: No Ecchymosis: No Fluctuance: No Erythema: No Friable: No Hemosiderin Staining: Yes Induration: No Mottled: No Localized Edema: No Pallor: No Rash: No Rubor: No Scarring: No Temperature / Pain Moisture Temperature: No Abnormality No Abnormalities Noted: No Tenderness on Palpation: Yes Dry / Scaly: No Maceration: No Moist: Yes Wound Preparation Ulcer Cleansing: Rinsed/Irrigated with Saline Topical Anesthetic Applied: Other: lidocaine 4%, Treatment Notes Wound #1 (Right, Medial Lower Leg) 1. Cleansed with: Clean wound with Normal Saline 2. Anesthetic Topical Lidocaine 4% cream to wound bed prior to debridement 3. [name]-wound Care: Barrier cream 4. Dressing Applied: Aquacel Ag 5. Secondary Dressing Applied ABD Pad 7. Secured with 3 Layer Compression System - Bilateral Notes Drawtex added for absorption. Electronic Signature(s) RHONDIA, NAVALTA ([id number]) Signed: 12/23/2015 4:49:25 PM By: Elliot Gurney RN, [name], [name], [name] Entered By: Elliot Gurney, RN, [name], Kim on 12/23/2015 15:38:19 Kibler, [name] ([id number]) -------------------------------------------------------------------------------- Vitals Details Patient Name: Colarusso, Arlisha L. Date of Service: 12/23/2015 3:00 PM Medical  Record Patient Account Number: [id number] Number: Treating RN: Huel Coventry Dec 02, 1955 (59 y.o. Other Clinician: Date of Birth/Sex: Female) Treating ROBSON, MICHAEL Primary Care Physician: PATIENT, NO Physician/Extender: G Referring Physician: Everette Rank in Treatment: 0 Vital Signs Time Taken: 15:25 Temperature (F): 98.3 Height (in): 62 Pulse (bpm):  107 Source: Measured Respiratory Rate (breaths/min): 18 Weight (lbs): 170 Blood Pressure (mmHg): 152/90 Source: Stated Reference Range: 80 - 120 mg / dl Body Mass Index (BMI): 31.1 Electronic Signature(s) Signed: 12/23/2015 4:49:25 PM By: Elliot Gurney, RN, [name], [name], [name] Entered By: Elliot Gurney, RN, [name], Kim on 12/23/2015 15:25:42

## 2015-12-26 DIAGNOSIS — I87331 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 lower extremity: Secondary | ICD-10-CM | POA: Diagnosis not present

## 2015-12-26 NOTE — Progress Notes (Signed)
[name], [name] ([id number]) Visit Report for 12/26/2015 Arrival Information Details Patient Name: [name], [name] Date of Service: 12/26/2015 2:30 PM Medical Record Number: [id number] Patient Account Number: [id number] Date of Birth/Sex: July 21, 1956 (60 y.o. Female) Treating RN: Afful, RN, [name], [employer] Primary Care Physician: PATIENT, NO Other Clinician: Referring Physician: Phineas Semen Treating Physician/Extender: Rudene Re in Treatment: 0 Visit Information History Since Last Visit Added or deleted any medications: No Patient Arrived: Wheel Chair Any new allergies or adverse reactions: No Arrival Time: 14:30 Had a fall or experienced change in No activities of daily living that may affect Accompanied By: sister risk of falls: Transfer Assistance: None Signs or symptoms of abuse/neglect since last No Patient Identification Verified: Yes visito Secondary Verification Process Yes Hospitalized since last visit: No Completed: Has Dressing in Place as Prescribed: Yes Patient Requires Transmission-Based No Pain Present Now: No Precautions: Patient Has Alerts: No Electronic Signature(s) Signed: 12/26/2015 3:09:12 PM By: Elpidio Eric [name], RN Entered By: Elpidio Eric on 12/26/2015 15:09:12 Llanas, [name] ([id number]) -------------------------------------------------------------------------------- Encounter Discharge Information Details Patient Name: [name], [name] Date of Service: 12/26/2015 2:30 PM Medical Record Number: [id number] Patient Account Number: [id number] Date of Birth/Sex: December 08, 1955 (60 y.o. Female) Treating RN: Afful, RN, [name], [employer] Primary Care Physician: PATIENT, NO Other Clinician: Referring Physician: Phineas Semen Treating Physician/Extender: Rudene Re in Treatment: 0 Encounter Discharge Information Items Discharge Pain Level: 0 Discharge Condition: Stable Ambulatory Status: Wheelchair Discharge Destination:  Home Private Transportation: Auto Accompanied By: sister Schedule Follow-up Appointment: No Medication Reconciliation completed and No provided to Patient/Care Nilda Keathley: Clinical Summary of Care: Electronic Signature(s) Signed: 12/26/2015 3:11:06 PM By: Elpidio Eric [name], RN Entered By: Elpidio Eric on 12/26/2015 15:11:06 Nohr, [name] ([id number]) -------------------------------------------------------------------------------- Patient/Caregiver Education Details Patient Name: [name] Comber. Date of Service: 12/26/2015 2:30 PM Medical Record Number: [id number] Patient Account Number: [id number] Date of Birth/Gender: 11-Oct-1955 (60 y.o. Female) Treating RN: Clover Mealy, RN, [name], [employer] Primary Care Physician: PATIENT, NO Other Clinician: Referring Physician: Phineas Semen Treating Physician/Extender: Rudene Re in Treatment: 0 Education Assessment Education Provided To: Patient Education Topics Provided [facility]: Methods: Explain/Verbal Responses: State content correctly Electronic Signature(s) Signed: 12/26/2015 3:10:48 PM By: Elpidio Eric [name], RN Entered By: Elpidio Eric on 12/26/2015 15:10:48 Lizardi, [name] ([id number]) -------------------------------------------------------------------------------- Wound Assessment Details Patient Name: [name], [name] Date of Service: 12/26/2015 2:30 PM Medical Record Number: [id number] Patient Account Number: [id number] Date of Birth/Sex: [birth year] (60 y.o. Female) Treating RN: Afful, RN, [name], Psychologist, clinical Primary Care Physician: PATIENT, NO Other Clinician: Referring Physician: Phineas Semen Treating Physician/Extender: Rudene Re in Treatment: 0 Wound Status Wound Number: 1 Primary Etiology: Venous Leg Ulcer Wound Location: Right Lower Leg - Medial Wound Status: Open Wounding Event: Blister Date Acquired: 12/15/2015 Weeks Of Treatment: 0 Clustered Wound: No Wound Measurements Length:  (cm) 6 Width: (cm) 5.4 Depth: (cm) 0.1 Area: (cm) 25.447 Volume: (cm) 2.545 % Reduction in Area: 1.8% % Reduction in Volume: 1.8% Epithelialization: None Tunneling: No Undermining: No Wound Description Classification: Partial Thickness Wound Margin: Flat and Intact Exudate Amount: Large Exudate Type: Serosanguineous Exudate Color: red, brown Foul Odor After Cleansing: No Wound Bed Granulation Amount: Medium (34-66%) Exposed Structure Granulation Quality: Red, Friable Fascia Exposed: No Necrotic Amount: Medium (34-66%) Fat Layer Exposed: No Necrotic Quality: Adherent Slough Tendon Exposed: No Muscle Exposed: No Joint Exposed: No Bone Exposed: No Limited to Skin Breakdown Periwound Skin Texture Texture Color No Abnormalities Noted: No No Abnormalities Noted: No Callus: No Atrophie  Blanche: No Crepitus: No Cyanosis: No Excoriation: No Ecchymosis: No Fluctuance: No Erythema: No [name], [name] ([id number]) Friable: No Hemosiderin Staining: Yes Induration: No Mottled: No Localized Edema: No Pallor: No Rash: No Rubor: No Scarring: No Temperature / Pain Moisture Temperature: No Abnormality No Abnormalities Noted: No Tenderness on Palpation: Yes Dry / Scaly: No Maceration: No Moist: Yes Wound Preparation Ulcer Cleansing: Rinsed/Irrigated with Saline Topical Anesthetic Applied: Other: lidocaine 4%, Treatment Notes Wound #1 (Right, Medial Lower Leg) 1. Cleansed with: Cleanse wound with antibacterial soap and water 4. Dressing Applied: Aquacel Ag 5. Secondary Dressing Applied ABD Pad 7. Secured with 3 Layer Compression System - Bilateral Notes Drawtex added for absorption. Electronic Signature(s) Signed: 12/26/2015 3:09:42 PM By: Elpidio Eric [name], RN Entered By: Elpidio Eric on 12/26/2015 15:09:42

## 2015-12-31 ENCOUNTER — Encounter: Payer: Medicare Other | Admitting: Internal Medicine

## 2015-12-31 DIAGNOSIS — I87331 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 lower extremity: Secondary | ICD-10-CM | POA: Diagnosis not present

## 2016-01-01 NOTE — Progress Notes (Signed)
[name] [name] ([id number]) Visit Report for 12/31/2015 Arrival Information Details Patient Name: [name], [name]. Date of Service: 12/31/2015 1:30 PM Medical Record Patient Account Number: [id number] Number: Treating RN: Huel Coventry Jan 27, 1956 (59 y.o. Other Clinician: Date of Birth/Sex: Female) Treating ROBSON, MICHAEL Primary Care Physician: PATIENT, NO Physician/Extender: G Referring Physician: Everette Rank in Treatment: 1 Visit Information History Since Last Visit Added or deleted any medications: No Patient Arrived: Ambulatory Any new allergies or adverse reactions: No Arrival Time: 13:16 Had a fall or experienced change in No Accompanied By: caregiver activities of daily living that may affect Transfer Assistance: None risk of falls: Patient Identification Verified: Yes Signs or symptoms of abuse/neglect since last No Secondary Verification Process Yes visito Completed: Has Dressing in Place as Prescribed: Yes Patient Requires Transmission-Based No Pain Present Now: No Precautions: Patient Has Alerts: No Electronic Signature(s) Signed: 12/31/2015 5:44:23 PM By: Elliot Gurney, RN, [name], [name], [name] Entered By: Elliot Gurney, RN, [name], Kim on 12/31/2015 13:17:47 [name] [name] [name] ([id number]) -------------------------------------------------------------------------------- Encounter Discharge Information Details Patient Name: [name] [name] L. Date of Service: 12/31/2015 1:30 PM Medical Record Patient Account Number: [id number] Number: Treating RN: Huel Coventry 30-Jun-1956 (59 y.o. Other Clinician: Date of Birth/Sex: Female) Treating ROBSON, MICHAEL Primary Care Physician: PATIENT, NO Physician/Extender: G Referring Physician: Everette Rank in Treatment: 1 Encounter Discharge Information Items Discharge Pain Level: 0 Discharge Condition: Stable Ambulatory Status: Ambulatory Discharge Destination:  Home Private Transportation: Auto Accompanied By: caregiver Schedule Follow-up Appointment: Yes Medication Reconciliation completed and Yes provided to Patient/Care Matha Masse: Clinical Summary of Care: Electronic Signature(s) Signed: 12/31/2015 5:44:23 PM By: Elliot Gurney, RN, [name], [name], [name] Entered By: Elliot Gurney, RN, [name], Kim on 12/31/2015 13:58:23 [name] [name] ([id number]) -------------------------------------------------------------------------------- Lower Extremity Assessment Details Patient Name: [name] [name] L. Date of Service: 12/31/2015 1:30 PM Medical Record Patient Account Number: [id number] Number: Treating RN: Huel Coventry [birth year] (59 y.o. Other Clinician: Date of Birth/Sex: Female) Treating ROBSON, MICHAEL Primary Care Physician: PATIENT, NO Physician/Extender: G Referring Physician: Phineas Semen Weeks in Treatment: 1 Edema Assessment Assessed: [Left: No] [Right: No] E[Left: dema] [Right: :] Calf Left: Right: Point of Measurement: 31 cm From Medial Instep 40.1 cm 44.5 cm Ankle Left: Right: Point of Measurement: 11 cm From Medial Instep 30 cm 29 cm Vascular Assessment Pulses: Posterior Tibial Dorsalis Pedis Palpable: [Left:Yes] [Right:Yes] Extremity colors, hair growth, and conditions: Extremity Color: [Left:Hyperpigmented] [Right:Hyperpigmented] Hair Growth on Extremity: [Left:No] [Right:No] Temperature of Extremity: [Left:Warm] [Right:Warm] Capillary Refill: [Left:< 3 seconds] [Right:< 3 seconds] Toe Nail Assessment Left: Right: Thick: No No Discolored: No No Deformed: No No Improper Length and Hygiene: No No Electronic Signature(s) Signed: 12/31/2015 5:44:23 PM By: Elliot Gurney, RN, [name], [name], [name] Entered By: Elliot Gurney, RN, [name], Kim on 12/31/2015 13:25:46 [name] [name] ([id number]) [name] [name] ([id number]) -------------------------------------------------------------------------------- Multi Wound Chart Details Patient  Name: [name] [name] L. Date of Service: 12/31/2015 1:30 PM Medical Record Patient Account Number: [id number] Number: Treating RN: Huel Coventry [birth year] (59 y.o. Other Clinician: Date of Birth/Sex: Female) Treating ROBSON, MICHAEL Primary Care Physician: PATIENT, NO Physician/Extender: G Referring Physician: Phineas Semen Weeks in Treatment: 1 Vital Signs Height(in): 62 Pulse(bpm): 102 Weight(lbs): 170 Blood Pressure 144/98 (mmHg): Body Mass Index(BMI): 31 Temperature(F): 98.5 Respiratory Rate 18 (breaths/min): Photos: [1:No Photos] [N/A:N/A] Wound Location: [1:Right Lower Leg - Medial] [N/A:N/A] Wounding Event: [1:Blister] [N/A:N/A] Primary Etiology: [1:Venous Leg Ulcer] [N/A:N/A] Date Acquired: [1:12/15/2015] [N/A:N/A] Weeks of Treatment: [1:1] [N/A:N/A] Wound Status: [1:Open] [N/A:N/A] Measurements L x W x D  7x7x0.1 [N/A:N/A] (cm) Area (cm) : [1:38.485] [N/A:N/A] Volume (cm) : [1:3.848] [N/A:N/A] % Reduction in Area: [1:-48.50%] [N/A:N/A] % Reduction in Volume: -48.50% [N/A:N/A] Classification: [1:Partial Thickness] [N/A:N/A] Exudate Amount: [1:Large] [N/A:N/A] Exudate Type: [1:Serosanguineous] [N/A:N/A] Exudate Color: [1:red, brown] [N/A:N/A] Wound Margin: [1:Flat and Intact] [N/A:N/A] Granulation Amount: [1:Medium (34-66%)] [N/A:N/A] Granulation Quality: [1:Red, Friable] [N/A:N/A] Necrotic Amount: [1:Medium (34-66%)] [N/A:N/A] Exposed Structures: [1:Fascia: No Fat: No Tendon: No Muscle: No Joint: No Bone: No] [N/A:N/A] Limited to Skin Breakdown Epithelialization: None N/A N/A Periwound Skin Texture: Edema: No N/A N/A Excoriation: No Induration: No Callus: No Crepitus: No Fluctuance: No Friable: No Rash: No Scarring: No Periwound Skin Moist: Yes N/A N/A Moisture: Maceration: No Dry/Scaly: No Periwound Skin Color: Hemosiderin Staining: Yes N/A N/A Atrophie Blanche: No Cyanosis: No Ecchymosis: No Erythema: No Mottled: No Pallor:  No Rubor: No Temperature: No Abnormality N/A N/A Tenderness on Yes N/A N/A Palpation: Wound Preparation: Ulcer Cleansing: N/A N/A Rinsed/Irrigated with Saline Topical Anesthetic Applied: Other: lidocaine 4% Treatment Notes Electronic Signature(s) Signed: 12/31/2015 5:44:23 PM By: Elliot Gurney, RN, [name], [name], [name] Entered By: Elliot Gurney, RN, [name], Kim on 12/31/2015 13:34:12 Boliver, [name] [name] ([id number]) -------------------------------------------------------------------------------- Multi-Disciplinary Care Plan Details Patient Name: [name] [name] L. Date of Service: 12/31/2015 1:30 PM Medical Record Patient Account Number: [id number] Number: Treating RN: Huel Coventry 04/05/1956 (59 y.o. Other Clinician: Date of Birth/Sex: Female) Treating ROBSON, MICHAEL Primary Care Physician: PATIENT, NO Physician/Extender: G Referring Physician: Everette Rank in Treatment: 1 Active Inactive Abuse / Safety / Falls / Self Care Management Nursing Diagnoses: Impaired physical mobility Potential for falls Goals: Patient will remain injury free Date Initiated: 12/23/2015 Goal Status: Active Interventions: Assess fall risk on admission and as needed Notes: Nutrition Nursing Diagnoses: Imbalanced nutrition Goals: Patient/caregiver verbalizes understanding of need to maintain therapeutic glucose control per primary care physician Date Initiated: 12/23/2015 Goal Status: Active Interventions: Provide education on nutrition Notes: Orientation to the Wound Care Program Nursing Diagnoses: Knowledge deficit related to the wound healing center program YSABELLA, BABIARZ ([id number]) Goals: Patient/caregiver will verbalize understanding of the Wound Healing Center Program Date Initiated: 12/23/2015 Goal Status: Active Interventions: Provide education on orientation to the wound center Notes: Venous Leg Ulcer Nursing Diagnoses: Potential for venous Insuffiency (use before diagnosis  confirmed) Goals: Patient will maintain optimal edema control Date Initiated: 12/23/2015 Goal Status: Active Interventions: Provide education on venous insufficiency Notes: Wound/Skin Impairment Nursing Diagnoses: Impaired tissue integrity Goals: Ulcer/skin breakdown will heal within 14 weeks Date Initiated: 12/23/2015 Goal Status: Active Interventions: Assess ulceration(s) every visit Notes: Electronic Signature(s) Signed: 12/31/2015 5:44:23 PM By: Elliot Gurney, RN, [name], [name], [name] Entered By: Elliot Gurney, RN, [name], Kim on 12/31/2015 13:34:05 Frisbee, [name] [name] ([id number]) -------------------------------------------------------------------------------- Pain Assessment Details Patient Name: [name] [name] L. Date of Service: 12/31/2015 1:30 PM Medical Record Patient Account Number: [id number] Number: Treating RN: Huel Coventry [birth year] (59 y.o. Other Clinician: Date of Birth/Sex: Female) Treating ROBSON, MICHAEL Primary Care Physician: PATIENT, NO Physician/Extender: G Referring Physician: Everette Rank in Treatment: 1 Active Problems Location of Pain Severity and Description of Pain Patient Has Paino No Site Locations Pain Management and Medication Current Pain Management: Electronic Signature(s) Signed: 12/31/2015 5:44:23 PM By: Elliot Gurney, RN, [name], [name], [name] Entered By: Elliot Gurney, RN, [name], Kim on 12/31/2015 13:17:55 Aller, [name] [name] ([id number]) -------------------------------------------------------------------------------- Patient/Caregiver Education Details Patient Name: [name] [name] L. Date of Service: 12/31/2015 1:30 PM Medical Record Patient Account Number: [id number] Number: Treating RN: Huel Coventry [birth year] (59 y.o. Other Clinician: Date of Birth/Gender: Female)  Treating ROBSON, MICHAEL Primary Care Physician: PATIENT, NO Physician/Extender: G Referring Physician: Everette Rank in Treatment: 1 Education Assessment Education  Provided To: Patient Education Topics Provided Wound/Skin Impairment: Handouts: Caring for Your Ulcer, Other: wound care as prescribed Methods: Demonstration Responses: State content correctly Electronic Signature(s) Signed: 12/31/2015 5:44:23 PM By: Elliot Gurney, RN, [name], [name], [name] Entered By: Elliot Gurney, RN, [name], Kim on 12/31/2015 13:58:41 Decoste, [name] [name] ([id number]) -------------------------------------------------------------------------------- Wound Assessment Details Patient Name: [name] [name] L. Date of Service: 12/31/2015 1:30 PM Medical Record Patient Account Number: [id number] Number: Treating RN: Huel Coventry [birth year] (59 y.o. Other Clinician: Date of Birth/Sex: Female) Treating ROBSON, MICHAEL Primary Care Physician: PATIENT, NO Physician/Extender: G Referring Physician: Phineas Semen Weeks in Treatment: 1 Wound Status Wound Number: 1 Primary Etiology: Venous Leg Ulcer Wound Location: Right Lower Leg - Medial Wound Status: Open Wounding Event: Blister Date Acquired: 12/15/2015 Weeks Of Treatment: 1 Clustered Wound: No Photos Photo Uploaded By: Elliot Gurney, RN, [name], Kim on 12/31/2015 17:40:18 Wound Measurements Length: (cm) 7 Width: (cm) 7 Depth: (cm) 0.1 Area: (cm) 38.485 Volume: (cm) 3.848 % Reduction in Area: -48.5% % Reduction in Volume: -48.5% Epithelialization: None Wound Description Classification: Partial Thickness Foul Odor After Wound Margin: Flat and Intact Exudate Amount: Large Exudate Type: Serosanguineous Exudate Color: red, brown Cleansing: No Wound Bed Granulation Amount: Medium (34-66%) Exposed Structure Granulation Quality: Red, Friable Fascia Exposed: No Necrotic Amount: Medium (34-66%) Fat Layer Exposed: No [name] [name] L. ([id number]) Necrotic Quality: Adherent Slough Tendon Exposed: No Muscle Exposed: No Joint Exposed: No Bone Exposed: No Limited to Skin Breakdown Periwound Skin Texture Texture Color No  Abnormalities Noted: No No Abnormalities Noted: No Callus: No Atrophie Blanche: No Crepitus: No Cyanosis: No Excoriation: No Ecchymosis: No Fluctuance: No Erythema: No Friable: No Hemosiderin Staining: Yes Induration: No Mottled: No Localized Edema: No Pallor: No Rash: No Rubor: No Scarring: No Temperature / Pain Moisture Temperature: No Abnormality No Abnormalities Noted: No Tenderness on Palpation: Yes Dry / Scaly: No Maceration: No Moist: Yes Wound Preparation Ulcer Cleansing: Rinsed/Irrigated with Saline Topical Anesthetic Applied: Other: lidocaine 4%, Treatment Notes Wound #1 (Right, Medial Lower Leg) 1. Cleansed with: Clean wound with Normal Saline 2. Anesthetic Topical Lidocaine 4% cream to wound bed prior to debridement 3. Peri-wound Care: Barrier cream 4. Dressing Applied: Aquacel Ag 5. Secondary Dressing Applied ABD Pad 7. Secured with 2 Layer Lite Compression System - Bilateral Notes Drawtex added for absorption. Electronic Signature(s) [name] [name] [name] [name] ([id number]) Signed: 12/31/2015 5:44:23 PM By: Elliot Gurney, RN, [name], [name], [name] Entered By: Elliot Gurney, RN, [name], Kim on 12/31/2015 13:27:26 Kook, [name] [name] ([id number]) -------------------------------------------------------------------------------- Vitals Details Patient Name: [name] [name] L. Date of Service: 12/31/2015 1:30 PM Medical Record Patient Account Number: [id number] Number: Treating RN: Huel Coventry 22-Feb-1956 (59 y.o. Other Clinician: Date of Birth/Sex: Female) Treating ROBSON, MICHAEL Primary Care Physician: PATIENT, NO Physician/Extender: G Referring Physician: Everette Rank in Treatment: 1 Vital Signs Time Taken: 13:17 Temperature (F): 98.5 Height (in): 62 Pulse (bpm): 102 Weight (lbs): 170 Respiratory Rate (breaths/min): 18 Body Mass Index (BMI): 31.1 Blood Pressure (mmHg): 144/98 Reference Range: 80 - 120 mg / dl Electronic Signature(s) Signed:  12/31/2015 5:44:23 PM By: Elliot Gurney, RN, [name], [name], [name] Entered By: Elliot Gurney, RN, [name], Kim on 12/31/2015 13:18:18

## 2016-01-01 NOTE — Progress Notes (Signed)
[name] ([id number]) Visit Report for 12/31/2015 Chief Complaint Document Details [name] Date of Service: 12/31/2015 1:30 PM Patient Name: [name] Account Number: [id number] Medical Record Treating RN: [name] [id number] Number: Other Clinician: 02/16/1956 (59 y.o. Treating [name] Date of Birth/Sex: Female) Physician/Extender: [name] PATIENT, NO Physician: Referring Physician: Everette [name] in Treatment: 1 Information Obtained from: Patient Chief Complaint Patient is here for review of a fairly substantial wound on her right medial lower leg that is been present for the last week Electronic Signature(s) Signed: 01/01/2016 8:03:58 AM By: [name] Najjar [name] Entered By: [name] on 12/31/2015 13:41:38 [name], [name] ([id number]) -------------------------------------------------------------------------------- Debridement Details [name], [name] Date of Service: 12/31/2015 1:30 PM Patient Name: [name] Account Number: [id number] Medical Record Treating RN: [name] [id number] Number: Other Clinician: Jun 12, 1956 (59 y.o. Treating [name] Date of Birth/Sex: Female) Physician/Extender: [name] PATIENT, NO Physician: Referring Physician: Everette [name] in Treatment: 1 Debridement Performed for Wound #1 Right,Medial Lower Leg Assessment: Performed By: Physician [name] Debridement: Debridement Pre-procedure Yes Verification/Time Out Taken: Start Time: 13:32 Pain Control: Other : lidocaine 4% Level: Skin/Subcutaneous Tissue Total Area Debrided (L x 7 (cm) x 7 (cm) = 49 (cm) W): Tissue and other Viable, Non-Viable, Exudate, Fibrin/Slough, Subcutaneous material debrided: Instrument: Curette Bleeding: Moderate Hemostasis Achieved: Pressure End Time: 13:37 Procedural Pain: 0 Post Procedural Pain: 0 Response to Treatment: Procedure was tolerated well Post Debridement  Measurements of Total Wound Length: (cm) 7 Width: (cm) 7 Depth: (cm) 0.2 Volume: (cm) 7.697 Post Procedure Diagnosis Same as Pre-procedure Electronic Signature(s) Signed: 12/31/2015 5:44:23 PM By: [name] Signed: 01/01/2016 8:03:58 AM By: [name] Najjar [name] Entered By: [name] on 12/31/2015 13:47:19 [name] ([id number]) [name] ([id number]) -------------------------------------------------------------------------------- HPI Details [name] Date of Service: 12/31/2015 1:30 PM Patient Name: [name] Account Number: [id number] Medical Record Treating RN: [name] [id number] Number: Other Clinician: 07-Apr-1956 (59 y.o. Treating [name] Date of Birth/Sex: Female) Physician/Extender: [name] PATIENT, NO Physician: Referring Physician: Everette [name] in Treatment: 1 History of Present Illness HPI Description: 12/23/15; this is a 60 year old lady who recently relocated back to [place] from [place]. She is staying with a family friend previously lived with a daughter in [place]. 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 She is on chronic prednisone she is not a diabetic. Dose of prednisone is 10 mg. The patient tells me that roughly a week ago she developed a fairly substantial blister over this area that then ruptured. She was seen in the emergency room on 5/7 an x-ray of the leg was negative she was put on Septra although I don't think any cultures were done. She also tells me she has had chronic edema in her legs for a number of years. She had a similar presentation to currently in the right lateral lower leg roughly a year ago that she was able to heal on her own. As noted she has systemic lupus but does not have lupus nephritis according to the patient. She has a lot of edema in her lower legs. The ABI's could not be  obtained. 12/31/15; the patient's insurance which is [name] makes her out of network for any home health therefore we have been changing her dressing in our facility. I reviewed her trip to the ER on 12/21/2015 her creatinine was within the  normal range hemoglobin and white count normal. Culture of the wound was negative. X-ray of the leg showed soft tissue edema no foreign bodies. We have been dressing this with Aquacel Ag due to the amount of ongoing drainage Electronic Signature(s) Signed: 01/01/2016 8:03:58 AM By: [name] Najjar [name] Entered By: [name] on 12/31/2015 13:43:45 [name], [name] ([id number]) -------------------------------------------------------------------------------- Physical Exam Details [name] Date of Service: 12/31/2015 1:30 PM Patient Name: [name] Account Number: [id number] Medical Record Treating RN: [name] [id number] Number: Other Clinician: January 17, 1956 (59 y.o. Treating [name] Date of Birth/Sex: Female) Physician/Extender: [name] PATIENT, NO Physician: Referring Physician: Everette [name] in Treatment: 1 Constitutional Patient is hypertensive.. Pulse regular and within target range for patient.Marland Kitchen Respirations regular, non-labored and within target range.. Temperature is normal and within the target range for the patient.. Cardiovascular Pedal pulses are palpable through the edema. Edema present in both extremities. Quite extensively. This is probably mostly venous insufficiency. Notes Wound exam; the patient has a fairly substantial wound on the medial aspect of her right lower leg about 50% of this is covered in a adherent slough. I went ahead and did as much of a surface debridement as I could do. The wound is very vascular there is a lot of bleeding. There is no evidence of infection Electronic Signature(s) Signed: 01/01/2016 8:03:58 AM By: [name] Najjar [name] Entered By: [name] on 12/31/2015  13:46:22 [name], [name] ([id number]) -------------------------------------------------------------------------------- Physician Orders Details [name] Date of Service: 12/31/2015 1:30 PM Patient Name: [name] Account Number: [id number] Medical Record Treating RN: [name] [id number] Number: Other Clinician: [birth year] (59 y.o. 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 Anesthetic Wound #1 Right,Medial Lower Leg o Topical Lidocaine 4% cream applied to wound bed prior to debridement Skin Barriers/Peri-Wound [name] Wound #1 Right,Medial Lower Leg o Barrier cream Primary Wound Dressing Wound #1 Right,Medial Lower Leg o Aquacel Ag - or equivalent Secondary Dressing Wound #1 Right,Medial Lower Leg o ABD pad o Drawtex - or equivalent Dressing Change Frequency Wound #1 Right,Medial Lower Leg o Three times weekly - Once in wound [name] center (Tuesday) Follow-up Appointments [name], [name] ([id number]) Wound #1 Right,Medial Lower Leg o Return Appointment in 1 week. o Nurse Visit as needed Edema Control o 3 Layer Compression System - Bilateral - Profore Lite Additional Orders / Instructions Wound #1 Right,Medial Lower Leg o Activity as tolerated Electronic Signature(s) Signed: 12/31/2015 5:44:23 PM By: [name] Gurney RN, [name], [name], [name] Signed: 01/01/2016 8:03:58 AM By: [name] Najjar [name] Entered By: [name] Gurney, RN, [name], Kim on 12/31/2015 13:57:19 Caliendo, [name] ([id number]) -------------------------------------------------------------------------------- Problem List Details [name] Date of Service: 12/31/2015 1:30 PM Patient Name: [name] Account Number: [id number] Medical Record Treating RN: [name] [id number] Number: Other Clinician: [birth year] (59 y.o. Treating Fenix Rorke Date of Birth/Sex: Female) Physician/Extender: [name] PATIENT, NO Physician: Referring Physician: Everette [name] in Treatment: 1 Active Problems ICD-10 Encounter Code Description Active Date Diagnosis I87.331 Chronic venous hypertension (idiopathic) with ulcer and 12/23/2015 Yes inflammation of right lower extremity I87.322  Chronic venous hypertension (idiopathic) with 12/23/2015 Yes inflammation of left lower extremity M32.10 Systemic lupus erythematosus, organ or system 12/23/2015 Yes involvement unspecified Inactive Problems Resolved Problems Electronic Signature(s) Signed: 01/01/2016 8:03:58 AM By: [name] Najjar [name] Entered By: [name] on 12/31/2015 13:41:07 Cumberledge, [name] ([id number]) -------------------------------------------------------------------------------- Progress Note Details [name], [name] Date of Service: 12/31/2015 1:30 PM Patient Name: [name] Account Number: [id number] Medical Record Treating RN: [name] [id number] Number: Other Clinician: 07-16-1956 (59 y.o. Treating Irvin Lizama Date of Birth/Sex: Female) Physician/Extender: [name] PATIENT, NO Physician: Referring Physician: Everette [name] in Treatment: 1 Subjective Chief Complaint Information obtained from Patient Patient is here for review of a fairly substantial wound on her right medial lower leg that is been present for the last week History of Present Illness (HPI) 12/23/15; this is a 60 year old lady who recently relocated  back to [place] from [place]. She is staying with a family friend previously lived with a daughter in [place]. 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 She is on chronic prednisone she is not a diabetic. Dose of prednisone is 10 mg. The patient tells me that roughly a week ago she developed a fairly substantial blister over this area that then ruptured. She was seen in the emergency room on 5/7 an x-ray of the leg was negative she was put on Septra although I don't think any cultures were done. She also tells me she has had chronic edema in her legs for a number of years. She had a similar presentation to currently in the right lateral lower leg roughly a year ago that she was able to heal on her own. As noted she has systemic lupus but does not have lupus nephritis according to the patient. She has a lot of edema in her lower legs. The ABI's could not be obtained. 12/31/15; the patient's insurance which is [name] makes her out of network for any home health therefore we have been changing her dressing in our facility. I reviewed her trip to the ER on 12/21/2015 her creatinine was within the normal range hemoglobin and white count normal. Culture of the wound was negative. X-ray of the leg showed soft tissue edema no foreign bodies. We have been dressing this with Aquacel Ag due to the amount of ongoing drainage Objective Constitutional [name], [name] ([id number]) Patient is hypertensive.. Pulse regular and within target range for patient.Marland Kitchen Respirations regular, non-labored and within target range.. Temperature is normal and within the target range for the patient.. Vitals Time Taken: 1:17 PM, Height: 62 in, Weight: 170 lbs, BMI: 31.1, Temperature: 98.5 F, Pulse: 102 bpm, Respiratory Rate: 18 breaths/min, Blood Pressure: 144/98 mmHg. Cardiovascular Pedal pulses are palpable through the edema. Edema present in  both extremities. Quite extensively. This is probably mostly venous insufficiency. General Notes: Wound exam; the patient has a fairly substantial wound on the medial aspect of her right lower leg about 50% of this is covered in a adherent slough. I went ahead and did as much of a surface debridement as I could do. The wound is very vascular there is a lot of bleeding. There is no evidence of infection Integumentary (Hair, Skin) Wound #1 status is Open. Original cause of wound was Blister. The wound is located on the Right,Medial Lower Leg. The wound measures 7cm length x 7cm width x 0.1cm depth; 38.485cm^2 area and 3.848cm^3 volume. The wound is limited to skin breakdown.  There is a large amount of serosanguineous drainage noted. The wound margin is flat and intact. There is medium (34-66%) red, friable granulation within the wound bed. There is a medium (34-66%) amount of necrotic tissue within the wound bed including Adherent Slough. The periwound skin appearance exhibited: Moist, Hemosiderin Staining. The periwound skin appearance did not exhibit: Callus, Crepitus, Excoriation, Fluctuance, Friable, Induration, Localized Edema, Rash, Scarring, Dry/Scaly, Maceration, Atrophie Blanche, Cyanosis, Ecchymosis, Mottled, Pallor, Rubor, Erythema. Periwound temperature was noted as No Abnormality. The periwound has tenderness on palpation. 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 There was a Skin/Subcutaneous Pals, Torrance L. ([id number]) Tissue Debridement ([id number]) debridement with total area of 49 sq cm performed by [name] with the following instrument(s): Curette to remove Viable and Non-Viable tissue/material including Exudate, Fibrin/Slough, and Subcutaneous after achieving pain control using Other (lidocaine 4%). A time out was conducted prior to the start of the procedure. A Moderate amount of bleeding was controlled with Pressure. The procedure was tolerated well with a pain level of 0 throughout and a pain level of 0 following the procedure. Post Debridement Measurements: 7cm length x 7cm width x 0.2cm depth; 7.697cm^3 volume. Post procedure Diagnosis Wound #1: Same as Pre-Procedure Plan #1 substantial debridement done. Wound bleeds fairly easily. She is not on anticoagulants #2 I'm going to continue with the Aquacel Ag based dressings to help with the drainage. We will bring the patient in one time before I see her next week to change this. As mentioned she is out of network with all our local home health company so. #3 she has vascular studies next week #4 I think she can tolerate a Profore light wrap Electronic Signature(s) Signed: 01/01/2016 8:03:58 AM By: [name] Najjar [name] Entered By: [name] on 12/31/2015 13:49:19 Gloeckner, [name] ([id number]) -------------------------------------------------------------------------------- SuperBill Details [name], [name] Date of Service: 12/31/2015 Patient Name: [name] Account Number: [id number] Medical Record Treating RN: [name] [id number] Number: Other Clinician: 1956/05/24 (59 y.o. 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 I87.331 extremity I87.322 Chronic venous hypertension (idiopathic) with inflammation of left lower extremity M32.10 Systemic lupus erythematosus,  organ or system involvement unspecified Facility Procedures CPT4: Description Modifier Quantity Code [id number] 11042 - DEB SUBQ TISSUE 20 SQ CM/< 1 ICD-10 Description Diagnosis I87.331 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 lower extremity CPT4: 65681275 11045 - DEB SUBQ TISS EA ADDL 20CM 2 ICD-10 Description Diagnosis I87.331 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

## 2016-01-07 ENCOUNTER — Encounter: Payer: Medicare Other | Admitting: Internal Medicine

## 2016-01-07 DIAGNOSIS — I87331 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 lower extremity: Secondary | ICD-10-CM | POA: Diagnosis not present

## 2016-01-08 NOTE — Progress Notes (Signed)
[name] ([id number]) Visit Report for 01/07/2016 Chief Complaint Document Details [name], [name] Date of Service: 01/07/2016 3:00 PM Patient Name: [name] Account Number: [id number] Medical Record Treating RN: Huel Coventry [id number] Number: Other Clinician: 1956-01-30 (60 y.o. Treating [name] Date of Birth/Sex: Female) Physician/Extender: G Primary Care PATIENT, NO Physician: Referring Physician: Everette Rank in Treatment: 2 Information Obtained from: Patient Chief Complaint Patient is here for review of a fairly substantial wound on her right medial lower leg that is been present for the last week Electronic Signature(s) Signed: 01/07/2016 4:27:33 PM By: [name] Entered By: [name] Najjar on 01/07/2016 15:56:59 [name], [name] ([id number]) -------------------------------------------------------------------------------- Debridement Details Sprunger, Corbyn Date of Service: 01/07/2016 3:00 PM Patient Name: [name] Account Number: [id number] Medical Record Treating RN: Huel Coventry [id number] Number: Other Clinician: 06-20-1956 (60 y.o. Treating [name] Date of Birth/Sex: Female) Physician/Extender: G Primary Care PATIENT, NO Physician: Referring Physician: Everette Rank in Treatment: 2 Debridement Performed for Wound #1 Right,Medial Lower Leg Assessment: Performed By: Physician [name] Debridement: Debridement Pre-procedure Yes Verification/Time Out Taken: Start Time: 15:20 Pain Control: Other : lidocaine 4% Level: Skin/Subcutaneous Tissue Total Area Debrided (L x 6 (cm) x 6.5 (cm) = 39 (cm) W): Tissue and other Viable, Non-Viable, Exudate, Fibrin/Slough, Subcutaneous material debrided: Instrument: Curette Bleeding: Moderate Hemostasis Achieved: Pressure End Time: 15:23 Procedural Pain: 0 Post Procedural Pain: 0 Response to Treatment: Procedure was tolerated well Post Debridement  Measurements of Total Wound Length: (cm) 6 Width: (cm) 6.5 Depth: (cm) 0.2 Volume: (cm) 6.126 Post Procedure Diagnosis Same as Pre-procedure Electronic Signature(s) Signed: 01/07/2016 4:27:33 PM By: [name] Signed: 01/07/2016 5:05:53 PM By: [name], [name] Entered By: [name] Najjar on 01/07/2016 15:56:43 [name], [name] ([id number]) [name], [name] ([id number]) -------------------------------------------------------------------------------- HPI Details [name] Date of Service: 01/07/2016 3:00 PM Patient Name: [name] Account Number: [id number] Medical Record Treating RN: Huel Coventry [id number] Number: Other Clinician: 1956/06/20 (60 y.o. Treating [name] Date of Birth/Sex: Female) Physician/Extender: G Primary Care PATIENT, NO Physician: Referring Physician: Everette Rank in Treatment: 2 History of Present Illness HPI Description: 12/23/15; this is a 60 year old lady who recently relocated back to [place] from [place]. She is staying with a family friend previously lived with a daughter in [place]. 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 She is on chronic prednisone she is not a diabetic. Dose of prednisone is 10 mg. The patient tells me that roughly a week ago she developed a fairly substantial blister over this area that then ruptured. She was seen in the emergency room on 5/7 an x-ray of the leg was negative she was put on Septra although I don't think any cultures were done. She also tells me she has had chronic edema in her legs for a number of years. She had a similar presentation to currently in the right lateral lower leg roughly a year ago that she was able to heal on her own. As noted she has systemic lupus but does not have lupus nephritis according to the patient. She has a lot of edema in her lower legs. The ABI's could not be  obtained. 12/31/15; the patient's insurance which is [place] [employer] makes her out of network for any home health therefore we have been changing her dressing in our facility. I reviewed her trip to the ER on 12/21/2015 her creatinine was within the  normal range hemoglobin and white count normal. Culture of the wound was negative. X-ray of the leg showed soft tissue edema no foreign bodies. We have been dressing this with Aquacel Ag due to the amount of ongoing drainage 01/07/16; the patient had her arterial studies this morning I don't have these results area she comes back in to have [place] rewrap since she doesn't have access to home health Electronic Signature(s) Signed: 01/07/2016 4:27:33 PM By: [name] Entered By: [name] Najjar on 01/07/2016 15:57:49 [name], [name] ([id number]) -------------------------------------------------------------------------------- Physical Exam Details [name] Date of Service: 01/07/2016 3:00 PM Patient Name: [name] Account Number: [id number] Medical Record Treating RN: Huel Coventry [id number] Number: Other Clinician: 10/26/1955 (60 y.o. Treating [name] Date of Birth/Sex: Female) Physician/Extender: G Primary Care PATIENT, NO Physician: Referring Physician: Phineas Semen Weeks in Treatment: 2 Notes Wound exam; substantial wound on the medial right leg which requires an aggressive debridement. Very poor edema control. We continue to have her make a nurse visit as she does not have access to home health. Electronic Signature(s) Signed: 01/07/2016 4:27:33 PM By: [name] Entered By: [name] Najjar on 01/07/2016 16:13:39 [name], [name] ([id number]) -------------------------------------------------------------------------------- Physician Orders Details [name] Date of Service: 01/07/2016 3:00 PM Patient Name: [name] Account Number: [id number] Medical Record Treating RN: Huel Coventry [id number] Number: Other Clinician: 03-26-1956 (60 y.o. Treating [name] Date of Birth/Sex: Female) Physician/Extender: G Primary Care PATIENT, NO Physician: Referring Physician: Everette Rank in Treatment: 2 Verbal / Phone Orders: Yes Clinician: Huel Coventry Read Back and Verified: Yes Diagnosis Coding Wound Cleansing o Clean wound with Normal Saline. Anesthetic Wound #1 Right,Medial Lower Leg o Topical Lidocaine 4% cream applied to wound bed prior to debridement Skin Barriers/Peri-Wound Care Wound #1 Right,Medial Lower Leg o Barrier cream Primary Wound Dressing Wound #1 Right,Medial Lower Leg o Aquacel Ag - or equivalent Secondary Dressing Wound #1 Right,Medial Lower Leg o ABD pad o Drawtex - or equivalent Dressing Change Frequency Wound #1 Right,Medial Lower Leg o Three times weekly - Once in wound care center (Tuesday) Follow-up Appointments Wound #1 Right,Medial Lower Leg o Return Appointment in 1 week. o Nurse Visit as needed Edema Control o 4 Layer Compression System - Bilateral Poucher, MIAMOR AYLER ([id number]) Additional Orders / Instructions Wound #1 Right,Medial Lower Leg o Activity as tolerated Services and Therapies o DME provider, dressing supplies - Juxtalite for Left Leg (patient to pay) Electronic Signature(s) Signed: 01/07/2016 4:27:33 PM By: [name] Signed: 01/07/2016 5:05:53 PM By: [name], [name] Entered By: [name] Gurney, RN, [name], [name] on 01/07/2016 15:50:03 Esquivel, [name] ([id number]) -------------------------------------------------------------------------------- Problem List Details [name] Date of Service: 01/07/2016 3:00 PM Patient Name: [name] Account Number: [id number] Medical Record Treating RN: Huel Coventry [id number] Number: Other Clinician: 1956-01-18 (60 y.o. 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 Account Number: [id number] Medical  Record Treating RN: Huel Coventry [id number] Number: Other Clinician: [birth year] (60 y.o. Treating [name] Date of Birth/Sex: Female) Physician/Extender: G Primary Care PATIENT, NO Physician: Referring Physician: Everette Rank in Treatment: 2 Subjective Chief Complaint Information obtained from Patient Patient is here for review of a fairly substantial wound on her right medial lower leg that is been present for the last week History of Present Illness (HPI) 12/23/15; this is a 60 year old lady who recently relocated back to [place] from [place]. She is staying with a family friend previously lived with a daughter in [place]. 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 She is on chronic prednisone she is not a diabetic. Dose of prednisone is 10 mg. The patient tells me that roughly a week ago she developed a fairly substantial blister over this area that then ruptured. She was seen in the emergency room on 5/7 an x-ray of the leg was negative  she was put on Septra although I don't think any cultures were done. She also tells me she has had chronic edema in her legs for a number of years. She had a similar presentation to currently in the right lateral lower leg roughly a year ago that she was able to heal on her own. As noted she has systemic lupus but does not have lupus nephritis according to the patient. She has a lot of edema in her lower legs. The ABI's could not be obtained. 12/31/15; the patient's insurance which is [place] [employer] makes her out of network for any home health therefore we have been changing her dressing in our facility. I reviewed her trip to the ER on 12/21/2015 her creatinine was within the normal range hemoglobin and white count normal. Culture of the wound was negative. X-ray of the leg showed soft tissue edema no foreign bodies. We have been dressing this with Aquacel Ag due to the amount of ongoing drainage 01/07/16; the patient had her arterial studies this morning I don't have these results area she comes back in to have [place] rewrap since she doesn't have access to home health Objective [name], [name] ([id number]) Constitutional Vitals Time Taken: 2:55 PM, Height: 62 in, Weight: 170 lbs, BMI: 31.1, Temperature: 98.1 F, Pulse: 93 bpm, Respiratory Rate: 18 breaths/min, Blood Pressure: 125/85 mmHg. Integumentary (Hair, Skin) Wound #1 status is Open. Original cause of wound was Blister. The wound is located on the Right,Medial Lower Leg. The wound measures 6cm length x 6.5cm width x 0.1cm depth; 30.631cm^2 area and 3.063cm^3 volume. The wound is limited to skin breakdown. There is a large amount of serosanguineous drainage noted. The wound margin is flat and intact. There is medium (34-66%) red, friable granulation within the wound bed. There is a medium (34-66%) amount of necrotic tissue within the wound bed including Adherent Slough. The periwound skin appearance exhibited: Moist, Hemosiderin Staining.  The periwound skin appearance did not exhibit: Callus, Crepitus, Excoriation, Fluctuance, Friable, Induration, Localized Edema, Rash, Scarring, Dry/Scaly, Maceration, Atrophie Blanche, Cyanosis, Ecchymosis, Mottled, Pallor, Rubor, Erythema. Periwound temperature was noted as No Abnormality. The periwound has tenderness on palpation. 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 There  was a Skin/Subcutaneous Tissue Debridement ([id number]) debridement with total area of 39 sq cm performed by [name] with the following instrument(s): Curette to remove Viable and Non-Viable tissue/material including Exudate, Fibrin/Slough, and Subcutaneous after achieving pain control using Other (lidocaine 4%). A time out was conducted prior to the start of the procedure. A Moderate amount of bleeding was controlled with Pressure. The procedure was tolerated well with a pain level of 0 throughout and a pain level of 0 following the procedure. Post Debridement Measurements: 6cm length x 6.5cm width x 0.2cm depth; 6.126cm^3 volume. Post procedure Diagnosis Wound #1: Same as Pre-Procedure [name], [name] ([id number]) Plan Wound Cleansing: Clean wound with Normal Saline. Anesthetic: Wound #1 Right,Medial Lower Leg: Topical Lidocaine 4% cream applied to wound bed prior to debridement Skin Barriers/Peri-Wound Care: Wound #1 Right,Medial Lower Leg: Barrier cream Primary Wound Dressing: Wound #1 Right,Medial Lower Leg: Aquacel Ag - or equivalent Secondary Dressing: Wound #1 Right,Medial Lower Leg: ABD pad Drawtex - or equivalent Dressing  Change Frequency: Wound #1 Right,Medial Lower Leg: Three times weekly - Once in wound care center (Tuesday) Follow-up Appointments: Wound #1 Right,Medial Lower Leg: Return Appointment in 1 week. Nurse Visit as needed Edema Control: 4 Layer Compression System - Bilateral Additional Orders / Instructions: Wound #1 Right,Medial Lower Leg: Activity as tolerated Services and Therapies ordered were: DME provider, dressing supplies - Juxtalite for Left Leg (patient to pay) We will continue with the Aquacel Ag G, AVD and Drawtex Continue with the nurse visit I have increased her compression to a full Profore from a Profore light. She had vascular studies this morning we will need to follow-up on those to make sure that the Profore does not exceed her vascular supply. There was no evidence of severe arterial insufficiency at the bedside [name], [name] ([id number]) Electronic Signature(s) Signed: 01/07/2016 4:27:33 PM By: [name] Entered By: [name] Najjar on 01/07/2016 16:14:52 [name], [name] ([id number]) -------------------------------------------------------------------------------- SuperBill Details [name], [name] Date of Service: 01/07/2016 Patient Name: [name] Account Number: [id number] Medical Record Treating RN: Huel Coventry [id number] Number: Other Clinician: 1955/08/31 (60 y.o. Treating [name] Date of Birth/Sex: Female) Physician/Extender: G Primary Care Weeks in Treatment: 2 PATIENT, NO Physician: Referring Physician: Phineas Semen Diagnosis Coding ICD-10 Codes Code Description 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 lower I87.331 extremity I87.322 Chronic venous hypertension (idiopathic) with inflammation of left lower extremity M32.10 Systemic lupus erythematosus, organ or system involvement unspecified Facility Procedures CPT4: Description Modifier Quantity Code [id number] 11042 - DEB SUBQ TISSUE 20  SQ CM/< 1 ICD-10 Description Diagnosis I87.331 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 lower extremity CPT4: 37858850 11045 - DEB SUBQ TISS EA ADDL 20CM 1 ICD-10 Description Diagnosis I87.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

## 2016-01-08 NOTE — Progress Notes (Signed)
ULDINE, FUSTER ([id number]) Visit Report for 01/07/2016 Arrival Information Details Patient Name: [name], [name] Date of Service: 01/07/2016 3:00 PM Medical Record Patient Account Number: [id number] Number: Treating RN: Huel Coventry 12-Dec-1955 (59 y.o. Other Clinician: Date of Birth/Sex: Female) Treating ROBSON, MICHAEL Primary Care Physician: PATIENT, NO Physician/Extender: G Referring Physician: Everette Rank in Treatment: 2 Visit Information History Since Last Visit Added or deleted any medications: No Patient Arrived: Ambulatory Any new allergies or adverse reactions: No Arrival Time: 14:55 Had a fall or experienced change in No Accompanied By: caregiver activities of daily living that may affect Transfer Assistance: None risk of falls: Patient Identification Verified: Yes Hospitalized since last visit: No Secondary Verification Process Yes Has Dressing in Place as Prescribed: No Completed: Has Compression in Place as Prescribed: No Patient Requires Transmission-Based No Pain Present Now: No Precautions: Patient Has Alerts: No Electronic Signature(s) Signed: 01/07/2016 5:05:53 PM By: Elliot Gurney, RN, [name], [name], [name] Entered By: Elliot Gurney, RN, [name], Kim on 01/07/2016 14:55:33 [name], [name] ([id number]) -------------------------------------------------------------------------------- Encounter Discharge Information Details Patient Name: [name], [name] Date of Service: 01/07/2016 3:00 PM Medical Record Patient Account Number: [id number] Number: Treating RN: Huel Coventry [birth year] (59 y.o. Other Clinician: Date of Birth/Sex: Female) Treating ROBSON, MICHAEL Primary Care Physician: PATIENT, NO Physician/Extender: G Referring Physician: Everette Rank in Treatment: 2 Encounter Discharge Information Items Schedule Follow-up Appointment: No Medication Reconciliation completed No and provided to Patient/Care Jalik Gellatly: Provided  on Clinical Summary of Care: 01/07/2016 Form Type Recipient Paper Patient GC Electronic Signature(s) Signed: 01/07/2016 3:50:11 PM By: Gwenlyn Perking Entered By: Gwenlyn Perking on 01/07/2016 15:50:11 [name], [name] ([id number]) -------------------------------------------------------------------------------- Lower Extremity Assessment Details Patient Name: [name], [name] Date of Service: 01/07/2016 3:00 PM Medical Record Patient Account Number: [id number] Number: Treating RN: Huel Coventry 1955/10/20 (59 y.o. Other Clinician: Date of Birth/Sex: Female) Treating ROBSON, MICHAEL Primary Care Physician: PATIENT, NO Physician/Extender: G Referring Physician: Phineas Semen Weeks in Treatment: 2 Edema Assessment Assessed: [Left: No] [Right: No] E[Left: dema] [Right: :] Calf Left: Right: Point of Measurement: 31 cm From Medial Instep 41.5 cm 47 cm Ankle Left: Right: Point of Measurement: 11 cm From Medial Instep 31.5 cm 30 cm Vascular Assessment Pulses: Posterior Tibial Dorsalis Pedis Palpable: [Left:Yes] [Right:Yes] Extremity colors, hair growth, and conditions: Extremity Color: [Left:Hyperpigmented] [Right:Hyperpigmented] Hair Growth on Extremity: [Left:No] [Right:No] Temperature of Extremity: [Left:Warm] [Right:Cool] Capillary Refill: [Left:> 3 seconds] [Right:> 3 seconds] Toe Nail Assessment Left: Right: Thick: No No Discolored: No No Deformed: No No Improper Length and Hygiene: No No Electronic Signature(s) Signed: 01/07/2016 5:05:53 PM By: Elliot Gurney, RN, [name], [name], [name] Entered By: Elliot Gurney, RN, [name], Kim on 01/07/2016 15:02:11 [name], [name] ([id number]) [name], [name] ([id number]) -------------------------------------------------------------------------------- Multi Wound Chart Details Patient Name: [name], [name] Date of Service: 01/07/2016 3:00 PM Medical Record Patient Account Number: [id number] Number: Treating RN: Huel Coventry 1956-04-18 (59 y.o. Other Clinician: Date of Birth/Sex: Female) Treating ROBSON, MICHAEL Primary Care Physician: PATIENT, NO Physician/Extender: G Referring Physician: Phineas Semen Weeks in Treatment: 2 Vital Signs Height(in): 62 Pulse(bpm): 93 Weight(lbs): 170 Blood Pressure 125/85 (mmHg): Body Mass Index(BMI): 31 Temperature(F): 98.1 Respiratory Rate 18 (breaths/min): Photos: [1:No Photos] [N/A:N/A] Wound Location: [1:Right Lower Leg - Medial] [N/A:N/A] Wounding Event: [1:Blister] [N/A:N/A] Primary Etiology: [1:Venous Leg Ulcer] [N/A:N/A] Date Acquired: [1:12/15/2015] [N/A:N/A] Weeks of Treatment: [1:2] [N/A:N/A] Wound Status: [1:Open] [N/A:N/A] Measurements L x W x D 6x6.5x0.1 [N/A:N/A] (cm) Area (cm) : [1:30.631] [N/A:N/A] Volume (cm) : [1:3.063] [N/A:N/A] %  Reduction in Area: [1:-18.20%] [N/A:N/A] % Reduction in Volume: -18.20% [N/A:N/A] Classification: [1:Partial Thickness] [N/A:N/A] Exudate Amount: [1:Large] [N/A:N/A] Exudate Type: [1:Serosanguineous] [N/A:N/A] Exudate Color: [1:red, brown] [N/A:N/A] Wound Margin: [1:Flat and Intact] [N/A:N/A] Granulation Amount: [1:Medium (34-66%)] [N/A:N/A] Granulation Quality: [1:Red, Friable] [N/A:N/A] Necrotic Amount: [1:Medium (34-66%)] [N/A:N/A] Exposed Structures: [1:Fascia: No Fat: No Tendon: No Muscle: No Joint: No Bone: No] [N/A:N/A] Limited to Skin Breakdown Epithelialization: None N/A N/A Periwound Skin Texture: Edema: No N/A N/A Excoriation: No Induration: No Callus: No Crepitus: No Fluctuance: No Friable: No Rash: No Scarring: No Periwound Skin Moist: Yes N/A N/A Moisture: Maceration: No Dry/Scaly: No Periwound Skin Color: Hemosiderin Staining: Yes N/A N/A Atrophie Blanche: No Cyanosis: No Ecchymosis: No Erythema: No Mottled: No Pallor: No Rubor: No Temperature: No Abnormality N/A N/A Tenderness on Yes N/A N/A Palpation: Wound Preparation: Ulcer Cleansing: N/A N/A Rinsed/Irrigated  with Saline Topical Anesthetic Applied: Other: lidocaine 4% Treatment Notes Electronic Signature(s) Signed: 01/07/2016 5:05:53 PM By: Elliot Gurney, RN, [name], [name], [name] Entered By: Elliot Gurney, RN, [name], Kim on 01/07/2016 15:06:53 Selleck, [name] ([id number]) -------------------------------------------------------------------------------- Multi-Disciplinary Care Plan Details Patient Name: [name], [name] Date of Service: 01/07/2016 3:00 PM Medical Record Patient Account Number: [id number] Number: Treating RN: Huel Coventry 05-09-1956 (59 y.o. Other Clinician: Date of Birth/Sex: Female) Treating ROBSON, MICHAEL Primary Care Physician: PATIENT, NO Physician/Extender: G Referring Physician: Everette Rank in Treatment: 2 Active Inactive Abuse / Safety / Falls / Self Care Management Nursing Diagnoses: Impaired physical mobility Potential for falls Goals: Patient will remain injury free Date Initiated: 12/23/2015 Goal Status: Active Interventions: Assess fall risk on admission and as needed Notes: Nutrition Nursing Diagnoses: Imbalanced nutrition Goals: Patient/caregiver verbalizes understanding of need to maintain therapeutic glucose control per primary care physician Date Initiated: 12/23/2015 Goal Status: Active Interventions: Provide education on nutrition Notes: Orientation to the Wound Care Program Nursing Diagnoses: Knowledge deficit related to the wound healing center program MINAMI, ANCELET ([id number]) Goals: Patient/caregiver will verbalize understanding of the Wound Healing Center Program Date Initiated: 12/23/2015 Goal Status: Active Interventions: Provide education on orientation to the wound center Notes: Venous Leg Ulcer Nursing Diagnoses: Potential for venous Insuffiency (use before diagnosis confirmed) Goals: Patient will maintain optimal edema control Date Initiated: 12/23/2015 Goal Status: Active Interventions: Provide education on  venous insufficiency Notes: Wound/Skin Impairment Nursing Diagnoses: Impaired tissue integrity Goals: Ulcer/skin breakdown will heal within 14 weeks Date Initiated: 12/23/2015 Goal Status: Active Interventions: Assess ulceration(s) every visit Notes: Electronic Signature(s) Signed: 01/07/2016 5:05:53 PM By: Elliot Gurney, RN, [name], [name], [name] Entered By: Elliot Gurney, RN, [name], Kim on 01/07/2016 15:06:46 [name], [name] ([id number]) -------------------------------------------------------------------------------- Pain Assessment Details Patient Name: [name], [name] Date of Service: 01/07/2016 3:00 PM Medical Record Patient Account Number: [id number] Number: Treating RN: Huel Coventry 1955/09/19 (59 y.o. Other Clinician: Date of Birth/Sex: Female) Treating ROBSON, MICHAEL Primary Care Physician: PATIENT, NO Physician/Extender: G Referring Physician: Everette Rank in Treatment: 2 Active Problems Location of Pain Severity and Description of Pain Patient Has Paino No Site Locations With Dressing Change: No Pain Management and Medication Current Pain Management: Electronic Signature(s) Signed: 01/07/2016 5:05:53 PM By: Elliot Gurney, RN, [name], [name], [name] Entered By: Elliot Gurney, RN, [name], Kim on 01/07/2016 14:55:57 [name], [name] ([id number]) -------------------------------------------------------------------------------- Patient/Caregiver Education Details Patient Name: [name], [name] Date of Service: 01/07/2016 3:00 PM Medical Record Patient Account Number: [id number] Number: Treating RN: Huel Coventry Mar 28, 1956 (59 y.o. Other Clinician: Date of Birth/Gender: Female) Treating ROBSON, MICHAEL Primary Care Physician: PATIENT, NO Physician/Extender: Reece Agar  Referring Physician: Everette Rank in Treatment: 2 Education Assessment Education Provided To: Patient Education Topics Provided Venous: Handouts: Controlling Swelling with Multilayered Compression  Wraps Methods: Demonstration, Explain/Verbal Responses: State content correctly Electronic Signature(s) Signed: 01/07/2016 5:05:53 PM By: Elliot Gurney, RN, [name], [name], [name] Entered By: Elliot Gurney, RN, [name], Kim on 01/07/2016 15:51:08 [name], [name] ([id number]) -------------------------------------------------------------------------------- Wound Assessment Details Patient Name: Bachicha, Amos L. Date of Service: 01/07/2016 3:00 PM Medical Record Patient Account Number: [id number] Number: Treating RN: Huel Coventry January 30, 1956 (59 y.o. Other Clinician: Date of Birth/Sex: Female) Treating ROBSON, MICHAEL Primary Care Physician: PATIENT, NO Physician/Extender: G Referring Physician: Phineas Semen Weeks in Treatment: 2 Wound Status Wound Number: 1 Primary Etiology: Venous Leg Ulcer Wound Location: Right Lower Leg - Medial Wound Status: Open Wounding Event: Blister Date Acquired: 12/15/2015 Weeks Of Treatment: 2 Clustered Wound: No Photos Photo Uploaded By: Elliot Gurney, RN, [name], Kim on 01/07/2016 15:10:58 Wound Measurements Length: (cm) 6 Width: (cm) 6.5 Depth: (cm) 0.1 Area: (cm) 30.631 Volume: (cm) 3.063 % Reduction in Area: -18.2% % Reduction in Volume: -18.2% Epithelialization: None Wound Description Classification: Partial Thickness Foul Odor After Wound Margin: Flat and Intact Exudate Amount: Large Exudate Type: Serosanguineous Exudate Color: red, brown Cleansing: No Wound Bed Granulation Amount: Medium (34-66%) Exposed Structure Granulation Quality: Red, Friable Fascia Exposed: No Necrotic Amount: Medium (34-66%) Fat Layer Exposed: No Hevener, Irie L. ([id number]) Necrotic Quality: Adherent Slough Tendon Exposed: No Muscle Exposed: No Joint Exposed: No Bone Exposed: No Limited to Skin Breakdown Periwound Skin Texture Texture Color No Abnormalities Noted: No No Abnormalities Noted: No Callus: No Atrophie Blanche: No Crepitus: No Cyanosis:  No Excoriation: No Ecchymosis: No Fluctuance: No Erythema: No Friable: No Hemosiderin Staining: Yes Induration: No Mottled: No Localized Edema: No Pallor: No Rash: No Rubor: No Scarring: No Temperature / Pain Moisture Temperature: No Abnormality No Abnormalities Noted: No Tenderness on Palpation: Yes Dry / Scaly: No Maceration: No Moist: Yes Wound Preparation Ulcer Cleansing: Rinsed/Irrigated with Saline Topical Anesthetic Applied: Other: lidocaine 4%, Treatment Notes Wound #1 (Right, Medial Lower Leg) 1. Cleansed with: Clean wound with Normal Saline 2. Anesthetic Topical Lidocaine 4% cream to wound bed prior to debridement 3. Peri-wound Care: Barrier cream 4. Dressing Applied: Aquacel Ag 5. Secondary Dressing Applied ABD Pad 7. Secured with 4 Layer Compression System - Bilateral Notes xtrasorb added for absorption. Electronic Signature(s) SHAYLON, GILLEAN ([id number]) Signed: 01/07/2016 5:05:53 PM By: Elliot Gurney, RN, [name], [name], [name] Entered By: Elliot Gurney, RN, [name], Kim on 01/07/2016 15:02:43 Lambson, [name] ([id number]) -------------------------------------------------------------------------------- Vitals Details Patient Name: Olexa, Jeani L. Date of Service: 01/07/2016 3:00 PM Medical Record Patient Account Number: [id number] Number: Treating RN: Huel Coventry [birth year] (59 y.o. Other Clinician: Date of Birth/Sex: Female) Treating ROBSON, MICHAEL Primary Care Physician: PATIENT, NO Physician/Extender: G Referring Physician: Everette Rank in Treatment: 2 Vital Signs Time Taken: 14:55 Temperature (F): 98.1 Height (in): 62 Pulse (bpm): 93 Weight (lbs): 170 Respiratory Rate (breaths/min): 18 Body Mass Index (BMI): 31.1 Blood Pressure (mmHg): 125/85 Reference Range: 80 - 120 mg / dl Electronic Signature(s) Signed: 01/07/2016 5:05:53 PM By: Elliot Gurney, RN, [name], [name], [name] Entered By: Elliot Gurney, RN, [name], Kim on 01/07/2016 14:56:20

## 2016-01-14 ENCOUNTER — Encounter: Payer: Medicare Other | Admitting: Internal Medicine

## 2016-01-14 DIAGNOSIS — I87331 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 lower extremity: Secondary | ICD-10-CM | POA: Diagnosis not present

## 2016-01-15 NOTE — Progress Notes (Signed)
[name], [name] ([id number]) Visit Report for 01/14/2016 Chief Complaint Document Details Cupps, [name] Date of Service: 01/14/2016 2:15 PM Patient Name: [name] Account Number: [id number] Medical Record Treating RN: Phillis Haggis [id number] Number: Other Clinician: 13-May-1956 (60 y.o. Treating Tabitha Riggins Date of Birth/Sex: Female) Physician/Extender: G Primary Care PATIENT, NO Physician: Referring Physician: Everette Rank in Treatment: 3 Information Obtained from: Patient Chief Complaint Patient is here for review of a fairly substantial wound on her right medial lower leg that is been present for the last week Electronic Signature(s) Signed: 01/14/2016 5:25:36 PM By: [name] Entered By: Baltazar Najjar on 01/14/2016 16:05:19 Petite, [name] ([id number]) -------------------------------------------------------------------------------- Debridement Details Nater, Van Date of Service: 01/14/2016 2:15 PM Patient Name: [name] Account Number: [id number] Medical Record Treating RN: Phillis Haggis [id number] Number: Other Clinician: 17-Feb-1956 (60 y.o. Treating Bright Spielmann Date of Birth/Sex: Female) Physician/Extender: G Primary Care PATIENT, NO Physician: Referring Physician: Everette Rank in Treatment: 3 Debridement Performed for Wound #1 Right,Medial Lower Leg Assessment: Performed By: Physician Maxwell Caul, MD Debridement: Debridement Pre-procedure Yes Verification/Time Out Taken: Start Time: 15:57 Pain Control: Lidocaine 4% Topical Solution Level: Skin/Subcutaneous Tissue Total Area Debrided (L x 5.8 (cm) x 6.2 (cm) = 35.96 (cm) W): Tissue and other Viable, Non-Viable, Exudate, Fibrin/Slough, Subcutaneous material debrided: Instrument: Other : scoop curette Bleeding: Minimum Hemostasis Achieved: Pressure End Time: 15:59 Procedural Pain: 0 Post Procedural Pain: 0 Response to Treatment: Procedure was  tolerated well Post Debridement Measurements of Total Wound Length: (cm) 5.8 Width: (cm) 6.2 Depth: (cm) 0.1 Volume: (cm) 2.824 Post Procedure Diagnosis Same as Pre-procedure Electronic Signature(s) Signed: 01/14/2016 5:25:36 PM By: [name] Signed: 01/14/2016 5:48:23 PM By: Alejandro Mulling Entered By: Baltazar Najjar on 01/14/2016 16:04:48 Mcaleer, [name] ([id number]) Mcglory, [name] ([id number]) -------------------------------------------------------------------------------- HPI Details [name] Date of Service: 01/14/2016 2:15 PM Patient Name: [name] Account Number: [id number] Medical Record Treating RN: Phillis Haggis [id number] Number: Other Clinician: 04/18/1956 (60 y.o. Treating Elyanah Farino Date of Birth/Sex: Female) Physician/Extender: G Primary Care PATIENT, NO Physician: Referring Physician: Everette Rank in Treatment: 3 History of Present Illness HPI Description: 12/23/15; this is a 60 year old lady who recently relocated back to [place] from [place]. She is staying with a family friend previously lived with a daughter in [place]. 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 She is on chronic prednisone she is not a diabetic. Dose of prednisone is 10 mg. The patient tells me that roughly a week ago she developed a fairly substantial blister over this area that then ruptured. She was seen in the emergency room on 5/7 an x-ray of the leg was negative she was put on Septra although I don't think any cultures were done. She also tells me she has had chronic edema in her legs for a number of years. She had a similar presentation to currently in the right lateral lower leg roughly a year ago that she was able to heal on her own. As noted she has systemic lupus but does not have lupus nephritis according to the patient. She has a lot of edema in her lower legs. The  ABI's could not be obtained. 12/31/15; the patient's insurance which is [place] makes her out of network for any home health therefore we have been changing her dressing in our facility. I reviewed her trip to the ER on 12/21/2015 her creatinine was within the normal  range hemoglobin and white count normal. Culture of the wound was negative. X-ray of the leg showed soft tissue edema no foreign bodies. We have been dressing this with Aquacel Ag due to the amount of ongoing drainage 01/07/16; the patient had her arterial studies this morning I don't have these results area she comes back in to have [place] rewrap since she doesn't have access to home health 01/14/16 I still do not have the results of her arterial studies. Our nurse correctly pointed out that we've been wrapping the left leg without an open wound I think this had to do with the fact that she had so much edema when she came in I was concerned about leaving this unwrapped. The right leg wound has the beginning of epithelialization Electronic Signature(s) Signed: 01/14/2016 5:25:36 PM By: [name] Entered By: Baltazar Najjar on 01/14/2016 16:06:30 Hargreaves, [name] ([id number]) -------------------------------------------------------------------------------- Physical Exam Details [name] Date of Service: 01/14/2016 2:15 PM Patient Name: [name] Account Number: [id number] Medical Record Treating RN: Phillis Haggis [id number] Number: Other Clinician: 1956-06-14 (60 y.o. Treating [name] Date of Birth/Sex: Female) Physician/Extender: G Primary Care PATIENT, NO Physician: Referring Physician: Everette Rank in Treatment: 3 Notes Wound exam; substantial wound on the medial right leg in this setting of chronic severe venous insufficiency and inflammation and probably some degree of skin frailties secondary to chronic prednisone use [lupus]. Her peripheral pulses are intact there is no evidence of  infection continues to require surgical debridement to remove surface slough nonviable subcutaneous tissue. There is the beginnings of epithelialization Electronic Signature(s) Signed: 01/14/2016 5:25:36 PM By: [name] Entered By: Baltazar Najjar on 01/14/2016 16:07:50 Demartini, [name] ([id number]) -------------------------------------------------------------------------------- Physician Orders Details [name] Date of Service: 01/14/2016 2:15 PM Patient Name: [name] Account Number: [id number] Medical Record Treating RN: Phillis Haggis [id number] Number: Other Clinician: [birth year] (59 y.o. Treating Cleburne Savini Date of Birth/Sex: Female) Physician/Extender: G Primary Care PATIENT, NO Physician: Referring Physician: Everette Rank in Treatment: 3 Verbal / Phone Orders: Yes Clinician: Pinkerton, Debi Read Back and Verified: Yes Diagnosis Coding Wound Cleansing Wound #1 Right,Medial Lower Leg o Cleanse wound with mild soap and water Anesthetic Wound #1 Right,Medial Lower Leg o Topical Lidocaine 4% cream applied to wound bed prior to debridement Skin Barriers/Peri-Wound Care Wound #1 Right,Medial Lower Leg o Barrier cream Primary Wound Dressing Wound #1 Right,Medial Lower Leg o Aquacel Ag - or equivalent Secondary Dressing Wound #1 Right,Medial Lower Leg o ABD pad o Drawtex - or equivalent Dressing Change Frequency Wound #1 Right,Medial Lower Leg o Change dressing every week Follow-up Appointments Wound #1 Right,Medial Lower Leg o Return Appointment in 1 week. o Nurse Visit as needed Edema Control Gut, Jaysa L. ([id number]) o 4-Layer Compression System - Right Lower Extremity - unna to anchor Additional Orders / Instructions Wound #1 Right,Medial Lower Leg o Activity as tolerated Electronic Signature(s) Signed: 01/14/2016 5:25:36 PM By: [name] Signed: 01/14/2016 5:48:23 PM By: Alejandro Mulling Entered By: Alejandro Mulling on 01/14/2016 16:02:44 Smedley, [name] ([id number]) -------------------------------------------------------------------------------- Problem List Details [name] Date of Service: 01/14/2016 2:15 PM Patient Name: [name] Account Number: [id number] Medical Record Treating RN: Phillis Haggis [id number] Number: Other Clinician: Feb 28, 1956 (59 y.o. 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 12/23/2015 Yes inflammation of right  lower extremity I87.322 Chronic venous hypertension (idiopathic) with 12/23/2015 Yes inflammation of left lower extremity M32.10 Systemic lupus erythematosus, organ or system 12/23/2015 Yes involvement unspecified Inactive Problems Resolved Problems Electronic Signature(s) Signed: 01/14/2016 5:25:36 PM By: [name] Entered By: Baltazar Najjar on 01/14/2016 16:04:18 Bonny, [name] ([id number]) -------------------------------------------------------------------------------- Progress Note Details [name] Date of Service: 01/14/2016 2:15 PM Patient Name: [name] Account Number: [id number] Medical Record Treating RN: Phillis Haggis [id number] Number: Other Clinician: [birth year] (59 y.o. Treating Annakate Soulier Date of Birth/Sex: Female) Physician/Extender: G Primary Care PATIENT, NO Physician: Referring Physician: Everette Rank in Treatment: 3 Subjective Chief Complaint Information obtained from Patient Patient is here for review of a fairly substantial wound on her right medial lower leg that is been present for the last week History of Present Illness (HPI) 12/23/15; this is a 60 year old lady who recently relocated  back to [place] from [place]. She is staying with a family friend previously lived with a daughter in [place]. 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 She is on chronic prednisone she is not a diabetic. Dose of prednisone is 10 mg. The patient tells me that roughly a week ago she developed a fairly substantial blister over this area that then ruptured. She was seen in the emergency room on 5/7 an x-ray of the leg was negative she was put on Septra although I don't think any cultures were done. She also tells me she has had chronic edema in her legs for a number of years. She had a similar presentation to currently in the right lateral lower leg roughly a year ago that she was able to heal on her own. As noted she has systemic lupus but does not have lupus nephritis according to the patient. She has a lot of edema in her lower legs. The ABI's could not be obtained. 12/31/15; the patient's insurance which is [place] [employer] makes her out of network for any home health therefore we have been changing her dressing in our facility. I reviewed her trip to the ER on 12/21/2015 her creatinine was within the normal range hemoglobin and white count normal. Culture of the wound was negative. X-ray of the leg showed soft tissue edema no foreign bodies. We have been dressing this with Aquacel Ag due to the amount of ongoing drainage 01/07/16; the patient had her arterial studies this morning I don't have these results area she comes back in to have [place] rewrap since she doesn't have access to home health 01/14/16 I still do not have the results of her arterial studies. Our nurse correctly pointed out that we've been wrapping the left leg without an open wound I think this had to do with the fact that she had so much edema when she came in I was concerned about leaving this unwrapped. The right leg wound has the beginning  of epithelialization Mumaw, Genova L. ([id number]) Objective Constitutional Vitals Time Taken: 3:26 PM, Height: 62 in, Weight: 170 lbs, BMI: 31.1, Temperature: 98.2 F, Pulse: 99 bpm, Respiratory Rate: 18 breaths/min, Blood Pressure: 134/93 mmHg. Integumentary (Hair, Skin) Wound #1 status is Open. Original cause of wound was Blister. The wound is located on the Right,Medial Lower Leg. The wound measures 5.8cm length x 6.2cm width x 0.1cm depth; 28.243cm^2 area and 2.824cm^3 volume. The wound is limited to skin breakdown. There is no tunneling or undermining noted. There is a large amount of serosanguineous drainage  noted. The wound margin is flat and intact. There is medium (34-66%) red, friable granulation within the wound bed. There is a medium (34-66%) amount of necrotic tissue within the wound bed including Adherent Slough. The periwound skin appearance exhibited: Moist, Hemosiderin Staining. The periwound skin appearance did not exhibit: Callus, Crepitus, Excoriation, Fluctuance, Friable, Induration, Localized Edema, Rash, Scarring, Dry/Scaly, Maceration, Atrophie Blanche, Cyanosis, Ecchymosis, Mottled, Pallor, Rubor, Erythema. Periwound temperature was noted as No Abnormality. The periwound has tenderness on palpation. 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 There was a Skin/Subcutaneous Tissue Debridement ([id number]) debridement with total area of 35.96 sq cm performed by Maxwell Caul, MD. with the following instrument(s): scoop curette to remove Viable and Non-Viable tissue/material including Exudate, Fibrin/Slough,  and Subcutaneous after achieving pain control using Lidocaine 4% Topical Solution. A time out was conducted prior to the start of the procedure. A Minimum amount of bleeding was controlled with Pressure. The procedure was tolerated well with a pain level of 0 throughout and a pain level of 0 following the procedure. Post Debridement Measurements: 5.8cm length x Forbis, Jenisa L. ([id number]) 6.2cm width x 0.1cm depth; 2.824cm^3 volume. Post procedure Diagnosis Wound #1: Same as Pre-Procedure Plan Wound Cleansing: Wound #1 Right,Medial Lower Leg: Cleanse wound with mild soap and water Anesthetic: Wound #1 Right,Medial Lower Leg: Topical Lidocaine 4% cream applied to wound bed prior to debridement Skin Barriers/Peri-Wound Care: Wound #1 Right,Medial Lower Leg: Barrier cream Primary Wound Dressing: Wound #1 Right,Medial Lower Leg: Aquacel Ag - or equivalent Secondary Dressing: Wound #1 Right,Medial Lower Leg: ABD pad Drawtex - or equivalent Dressing Change Frequency: Wound #1 Right,Medial Lower Leg: Change dressing every week Follow-up Appointments: Wound #1 Right,Medial Lower Leg: Return Appointment in 1 week. Nurse Visit as needed Edema Control: 4-Layer Compression System - Right Lower Extremity - unna to anchor Additional Orders / Instructions: Wound #1 Right,Medial Lower Leg: Activity as tolerated Krolikowski, Vannesa L. ([id number]) #1 we can continue the Aquacel Ag as long as we have progressive epithelialization. If this stalls consider Hydrofera Blue. She will continue with a Profore wraps on the right leg #2 I wrote her a prescription for 20-30 mm stockings bilaterally which she can put on the left leg. This would be until the right leg wound heals Electronic Signature(s) Signed: 01/14/2016 5:25:36 PM By: [name] Entered By: Baltazar Najjar on 01/14/2016 16:11:42 Waterworth, [name]  ([id number]) -------------------------------------------------------------------------------- SuperBill Details Pendley, Michie Date of Service: 01/14/2016 Patient Name: [name] Account Number: [id number] Medical Record Treating RN: Phillis Haggis [id number] Number: Other Clinician: 02-20-1956 (59 y.o. Treating Johntae Broxterman Date of Birth/Sex: Female) Physician/Extender: G Primary Care Weeks in Treatment: 3 PATIENT, NO Physician: Referring Physician: Phineas Semen Diagnosis Coding ICD-10 Codes Code Description 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 lower I87.331 extremity I87.322 Chronic venous hypertension (idiopathic) with inflammation of left lower extremity M32.10 Systemic lupus erythematosus, organ or system involvement unspecified Facility Procedures CPT4: Description Modifier Quantity Code [id number] 11042 - DEB SUBQ TISSUE 20 SQ CM/< 1 ICD-10 Description Diagnosis I87.331 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 lower extremity CPT4: 57262035 11045 - DEB SUBQ TISS EA ADDL 20CM 1 ICD-10 Description Diagnosis I87.331 Chronic  venous hypertension (idiopathic) with ulcer and inflammation of right lower extremity Physician Procedures CPT4: Description Modifier Quantity Code [id number] 11042 - WC PHYS SUBQ TISS 20 SQ CM 1 ICD-10 Description Diagnosis I87.331 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 lower extremity CPT4: 5284132 11045 - WC PHYS SUBQ TISS EA ADDL 20 CM 1 RALPH, BENAVIDEZ ([id number]) Electronic Signature(s) Signed: 01/14/2016 5:25:36 PM By: [name] Entered By: Baltazar Najjar on 01/14/2016 16:12:46

## 2016-01-15 NOTE — Progress Notes (Signed)
TANVIR, RAKESTRAW ([id number]) Visit Report for 01/14/2016 Arrival Information Details Patient Name: [name], [name] Date of Service: 01/14/2016 2:15 PM Medical Record Patient Account Number: [id number] Number: Treating RN: Phillis Haggis 11/21/1955 (59 y.o. Other Clinician: Date of Birth/Sex: Female) Treating ROBSON, MICHAEL Primary Care Physician: PATIENT, NO Physician/Extender: G Referring Physician: Everette Rank in Treatment: 3 Visit Information History Since Last Visit All ordered tests and consults were completed: No Patient Arrived: Ambulatory Added or deleted any medications: No Arrival Time: 15:21 Any new allergies or adverse reactions: No Accompanied By: family Had a fall or experienced change in No Transfer Assistance: None activities of daily living that may affect Patient Identification Verified: Yes risk of falls: Secondary Verification Process Yes Signs or symptoms of abuse/neglect since last No Completed: visito Patient Requires Transmission-Based No Hospitalized since last visit: No Precautions: Pain Present Now: No Patient Has Alerts: No Electronic Signature(s) Signed: 01/14/2016 5:48:23 PM By: Alejandro Mulling Entered By: Alejandro Mulling on 01/14/2016 15:22:01 [name], [name] ([id number]) -------------------------------------------------------------------------------- Encounter Discharge Information Details Patient Name: [name], [name] Date of Service: 01/14/2016 2:15 PM Medical Record Patient Account Number: [id number] Number: Treating RN: Phillis Haggis 05-03-1956 (59 y.o. Other Clinician: Date of Birth/Sex: Female) Treating ROBSON, MICHAEL Primary Care Physician: PATIENT, NO Physician/Extender: G Referring Physician: Everette Rank in Treatment: 3 Encounter Discharge Information Items Discharge Pain Level: 0 Discharge Condition: Stable Ambulatory Status: Ambulatory Discharge Destination:  Home Transportation: Private Auto Accompanied By: caregiver Schedule Follow-up Appointment: Yes Medication Reconciliation completed and provided to Patient/Care Yes Joshalyn Ancheta: Provided on Clinical Summary of Care: 01/14/2016 Form Type Recipient Paper Patient GC Electronic Signature(s) Signed: 01/14/2016 4:23:33 PM By: Gwenlyn Perking Entered By: Gwenlyn Perking on 01/14/2016 16:23:32 Cullinan, [name] ([id number]) -------------------------------------------------------------------------------- Lower Extremity Assessment Details Patient Name: Stalling, Brinlyn [name]. Date of Service: 01/14/2016 2:15 PM Medical Record Patient Account Number: [id number] Number: Treating RN: Phillis Haggis 02/01/1956 (59 y.o. Other Clinician: Date of Birth/Sex: Female) Treating ROBSON, MICHAEL Primary Care Physician: PATIENT, NO Physician/Extender: G Referring Physician: Everette Rank in Treatment: 3 Edema Assessment Assessed: [Left: No] [Right: No] E[Left: dema] [Right: :] Calf Left: Right: Point of Measurement: cm From Medial Instep 46.5 cm 48.5 cm Ankle Left: Right: Point of Measurement: cm From Medial Instep 27 cm 28 cm Vascular Assessment Pulses: Posterior Tibial Dorsalis Pedis Palpable: [Left:Yes] [Right:Yes] Extremity colors, hair growth, and conditions: Extremity Color: [Left:Hyperpigmented] [Right:Hyperpigmented] Temperature of Extremity: [Left:Warm] [Right:Warm] Capillary Refill: [Left:< 3 seconds] [Right:< 3 seconds] Toe Nail Assessment Left: Right: Thick: No No Discolored: No No Deformed: No No Improper Length and Hygiene: No No Electronic Signature(s) Signed: 01/14/2016 5:48:23 PM By: Alejandro Mulling Entered By: Alejandro Mulling on 01/14/2016 15:40:45 Rineer, [name] ([id number]) [name], [name] LMarland Kitchen ([id number]) -------------------------------------------------------------------------------- Multi Wound Chart Details Patient Name: Dadisman, Charlize [name]. Date  of Service: 01/14/2016 2:15 PM Medical Record Patient Account Number: [id number] Number: Treating RN: Phillis Haggis Jan 01, 1956 (59 y.o. Other Clinician: Date of Birth/Sex: Female) Treating ROBSON, MICHAEL Primary Care Physician: PATIENT, NO Physician/Extender: G Referring Physician: Everette Rank in Treatment: 3 Vital Signs Height(in): 62 Pulse(bpm): 99 Weight(lbs): 170 Blood Pressure 134/93 (mmHg): Body Mass Index(BMI): 31 Temperature(F): 98.2 Respiratory Rate 18 (breaths/min): Photos: [1:No Photos] [N/A:N/A] Wound Location: [1:Right Lower Leg - Medial] [N/A:N/A] Wounding Event: [1:Blister] [N/A:N/A] Primary Etiology: [1:Venous Leg Ulcer] [N/A:N/A] Date Acquired: [1:12/15/2015] [N/A:N/A] Weeks of Treatment: [1:3] [N/A:N/A] Wound Status: [1:Open] [N/A:N/A] Measurements [name] 5.8x6.2x0.1 [N/A:N/A] (cm) Area (cm) : [1:28.243] [N/A:N/A] Volume (cm) : [  1:2.824] [N/A:N/A] % Reduction in Area: [1:-9.00%] [N/A:N/A] % Reduction in Volume: -9.00% [N/A:N/A] Classification: [1:Partial Thickness] [N/A:N/A] Exudate Amount: [1:Large] [N/A:N/A] Exudate Type: [1:Serosanguineous] [N/A:N/A] Exudate Color: [1:red, brown] [N/A:N/A] Wound Margin: [1:Flat and Intact] [N/A:N/A] Granulation Amount: [1:Medium (34-66%)] [N/A:N/A] Granulation Quality: [1:Red, Friable] [N/A:N/A] Necrotic Amount: [1:Medium (34-66%)] [N/A:N/A] Exposed Structures: [1:Fascia: No Fat: No Tendon: No Muscle: No Joint: No Bone: No] [N/A:N/A] Limited to Skin Breakdown Epithelialization: None N/A N/A Periwound Skin Texture: Edema: No N/A N/A Excoriation: No Induration: No Callus: No Crepitus: No Fluctuance: No Friable: No Rash: No Scarring: No Periwound Skin Moist: Yes N/A N/A Moisture: Maceration: No Dry/Scaly: No Periwound Skin Color: Hemosiderin Staining: Yes N/A N/A Atrophie Blanche: No Cyanosis: No Ecchymosis: No Erythema: No Mottled: No Pallor: No Rubor: No Temperature:  No Abnormality N/A N/A Tenderness on Yes N/A N/A Palpation: Wound Preparation: Ulcer Cleansing: Other: N/A N/A soap and water Topical Anesthetic Applied: Other: lidocaine 4% Treatment Notes Electronic Signature(s) Signed: 01/14/2016 5:48:23 PM By: Alejandro Mulling Entered By: Alejandro Mulling on 01/14/2016 15:45:42 [name], [name] ([id number]) -------------------------------------------------------------------------------- Multi-Disciplinary Care Plan Details Patient Name: [name], [name] Date of Service: 01/14/2016 2:15 PM Medical Record Patient Account Number: [id number] Number: Treating RN: Phillis Haggis [birth year] (59 y.o. Other Clinician: Date of Birth/Sex: Female) Treating ROBSON, MICHAEL Primary Care Physician: PATIENT, NO Physician/Extender: G Referring Physician: Everette Rank in Treatment: 3 Active Inactive Abuse / Safety / Falls / Self Care Management Nursing Diagnoses: Impaired physical mobility Potential for falls Goals: Patient will remain injury free Date Initiated: 12/23/2015 Goal Status: Active Interventions: Assess fall risk on admission and as needed Notes: Nutrition Nursing Diagnoses: Imbalanced nutrition Goals: Patient/caregiver verbalizes understanding of need to maintain therapeutic glucose control per primary care physician Date Initiated: 12/23/2015 Goal Status: Active Interventions: Provide education on nutrition Notes: Orientation to the Wound Care Program Nursing Diagnoses: Knowledge deficit related to the wound healing center program CADANCE, RAUS ([id number]) Goals: Patient/caregiver will verbalize understanding of the Wound Healing Center Program Date Initiated: 12/23/2015 Goal Status: Active Interventions: Provide education on orientation to the wound center Notes: Venous Leg Ulcer Nursing Diagnoses: Potential for venous Insuffiency (use before diagnosis confirmed) Goals: Patient will maintain  optimal edema control Date Initiated: 12/23/2015 Goal Status: Active Interventions: Provide education on venous insufficiency Notes: Wound/Skin Impairment Nursing Diagnoses: Impaired tissue integrity Goals: Ulcer/skin breakdown will heal within 14 weeks Date Initiated: 12/23/2015 Goal Status: Active Interventions: Assess ulceration(s) every visit Notes: Electronic Signature(s) Signed: 01/14/2016 5:48:23 PM By: Alejandro Mulling Entered By: Alejandro Mulling on 01/14/2016 15:45:13 [name], [name] ([id number]) -------------------------------------------------------------------------------- Pain Assessment Details Patient Name: [name], [name] Date of Service: 01/14/2016 2:15 PM Medical Record Patient Account Number: [id number] Number: Treating RN: Phillis Haggis [birth year] (59 y.o. Other Clinician: Date of Birth/Sex: Female) Treating ROBSON, MICHAEL Primary Care Physician: PATIENT, NO Physician/Extender: G Referring Physician: Everette Rank in Treatment: 3 Active Problems Location of Pain Severity and Description of Pain Patient Has Paino No Site Locations Pain Management and Medication Current Pain Management: Electronic Signature(s) Signed: 01/14/2016 5:48:23 PM By: Alejandro Mulling Entered By: Alejandro Mulling on 01/14/2016 15:22:09 [name], [name] ([id number]) -------------------------------------------------------------------------------- Patient/Caregiver Education Details Patient Name: [name], [name] Date of Service: 01/14/2016 2:15 PM Medical Record Patient Account Number: [id number] Number: Treating RN: Phillis Haggis [birth year] (59 y.o. Other Clinician: Date of Birth/Gender: Female) Treating ROBSON, MICHAEL Primary Care Physician: PATIENT, NO Physician/Extender: G Referring Physician: Everette Rank in Treatment: 3 Education Assessment Education Provided To: Patient Education Topics Provided Wound/Skin  Impairment: Handouts: Other: do not get wrap wet Methods: Demonstration, Explain/Verbal Responses: State content correctly Electronic Signature(s) Signed: 01/14/2016 5:48:23 PM By: Alejandro Mulling Entered By: Alejandro Mulling on 01/14/2016 15:48:28 [name], [name] ([id number]) -------------------------------------------------------------------------------- Wound Assessment Details Patient Name: [name], [name] Date of Service: 01/14/2016 2:15 PM Medical Record Patient Account Number: [id number] Number: Treating RN: Phillis Haggis 1955-09-06 (59 y.o. Other Clinician: Date of Birth/Sex: Female) Treating ROBSON, MICHAEL Primary Care Physician: PATIENT, NO Physician/Extender: G Referring Physician: Everette Rank in Treatment: 3 Wound Status Wound Number: 1 Primary Etiology: Venous Leg Ulcer Wound Location: Right Lower Leg - Medial Wound Status: Open Wounding Event: Blister Date Acquired: 12/15/2015 Weeks Of Treatment: 3 Clustered Wound: No Photos Photo Uploaded By: Alejandro Mulling on 01/14/2016 17:46:39 Wound Measurements Length: (cm) 5.8 Width: (cm) 6.2 Depth: (cm) 0.1 Area: (cm) 28.243 Volume: (cm) 2.824 % Reduction in Area: -9% % Reduction in Volume: -9% Epithelialization: None Tunneling: No Undermining: No Wound Description Classification: Partial Thickness Wound Margin: Flat and Intact Exudate Amount: Large Exudate Type: Serosanguineous Exudate Color: red, brown Foul Odor After Cleansing: No Wound Bed Granulation Amount: Medium (34-66%) Exposed Structure Granulation Quality: Red, Friable Fascia Exposed: No Necrotic Amount: Medium (34-66%) Fat Layer Exposed: No Maltos, Paislie [name]. ([id number]) Necrotic Quality: Adherent Slough Tendon Exposed: No Muscle Exposed: No Joint Exposed: No Bone Exposed: No Limited to Skin Breakdown Periwound Skin Texture Texture Color No Abnormalities Noted: No No Abnormalities Noted: No Callus:  No Atrophie Blanche: No Crepitus: No Cyanosis: No Excoriation: No Ecchymosis: No Fluctuance: No Erythema: No Friable: No Hemosiderin Staining: Yes Induration: No Mottled: No Localized Edema: No Pallor: No Rash: No Rubor: No Scarring: No Temperature / Pain Moisture Temperature: No Abnormality No Abnormalities Noted: No Tenderness on Palpation: Yes Dry / Scaly: No Maceration: No Moist: Yes Wound Preparation Ulcer Cleansing: Other: soap and water, Topical Anesthetic Applied: Other: lidocaine 4%, Treatment Notes Wound #1 (Right, Medial Lower Leg) 1. Cleansed with: Cleanse wound with antibacterial soap and water 2. Anesthetic Topical Lidocaine 4% cream to wound bed prior to debridement 3. Peri-wound Care: Barrier cream 4. Dressing Applied: Aquacel Ag 5. Secondary Dressing Applied ABD Pad Dry Gauze 7. Secured with Tape 4-Layer Compression System - Right Lower Extremity Notes drawtex KYARAH, ENAMORADO ([id number]) Electronic Signature(s) Signed: 01/14/2016 5:48:23 PM By: Alejandro Mulling Entered By: Alejandro Mulling on 01/14/2016 15:45:00 Gosser, [name] ([id number]) -------------------------------------------------------------------------------- Vitals Details Patient Name: Depascale, Neddie [name]. Date of Service: 01/14/2016 2:15 PM Medical Record Patient Account Number: [id number] Number: Treating RN: Phillis Haggis 1955/09/10 (59 y.o. Other Clinician: Date of Birth/Sex: Female) Treating ROBSON, MICHAEL Primary Care Physician: PATIENT, NO Physician/Extender: G Referring Physician: Everette Rank in Treatment: 3 Vital Signs Time Taken: 15:26 Temperature (F): 98.2 Height (in): 62 Pulse (bpm): 99 Weight (lbs): 170 Respiratory Rate (breaths/min): 18 Body Mass Index (BMI): 31.1 Blood Pressure (mmHg): 134/93 Reference Range: 80 - 120 mg / dl Electronic Signature(s) Signed: 01/14/2016 5:48:23 PM By: Alejandro Mulling Entered By:  Alejandro Mulling on 01/14/2016 15:27:19

## 2016-01-16 ENCOUNTER — Encounter: Payer: Self-pay | Admitting: Emergency Medicine

## 2016-01-16 ENCOUNTER — Observation Stay
Admission: EM | Admit: 2016-01-16 | Discharge: 2016-01-19 | Disposition: A | Discharge status: Home / Self Care | Payer: Medicare Other | Attending: Internal Medicine | Admitting: Internal Medicine

## 2016-01-16 ENCOUNTER — Other Ambulatory Visit: Payer: Self-pay

## 2016-01-16 ENCOUNTER — Emergency Department: Payer: Medicare Other

## 2016-01-16 DIAGNOSIS — K7460 Unspecified cirrhosis of liver: Secondary | ICD-10-CM | POA: Insufficient documentation

## 2016-01-16 DIAGNOSIS — Z9049 Acquired absence of other specified parts of digestive tract: Secondary | ICD-10-CM | POA: Insufficient documentation

## 2016-01-16 DIAGNOSIS — M329 Systemic lupus erythematosus, unspecified: Secondary | ICD-10-CM | POA: Diagnosis not present

## 2016-01-16 DIAGNOSIS — R079 Chest pain, unspecified: Secondary | ICD-10-CM | POA: Diagnosis present

## 2016-01-16 DIAGNOSIS — R072 Precordial pain: Secondary | ICD-10-CM

## 2016-01-16 DIAGNOSIS — Z87891 Personal history of nicotine dependence: Secondary | ICD-10-CM | POA: Diagnosis not present

## 2016-01-16 DIAGNOSIS — I872 Venous insufficiency (chronic) (peripheral): Secondary | ICD-10-CM | POA: Insufficient documentation

## 2016-01-16 DIAGNOSIS — J849 Interstitial pulmonary disease, unspecified: Secondary | ICD-10-CM | POA: Insufficient documentation

## 2016-01-16 DIAGNOSIS — M79606 Pain in leg, unspecified: Secondary | ICD-10-CM | POA: Diagnosis present

## 2016-01-16 DIAGNOSIS — R0789 Other chest pain: Principal | ICD-10-CM | POA: Insufficient documentation

## 2016-01-16 DIAGNOSIS — J9809 Other diseases of bronchus, not elsewhere classified: Secondary | ICD-10-CM | POA: Diagnosis not present

## 2016-01-16 DIAGNOSIS — Z8673 Personal history of transient ischemic attack (TIA), and cerebral infarction without residual deficits: Secondary | ICD-10-CM | POA: Diagnosis not present

## 2016-01-16 DIAGNOSIS — R609 Edema, unspecified: Secondary | ICD-10-CM | POA: Diagnosis present

## 2016-01-16 DIAGNOSIS — Z79899 Other long term (current) drug therapy: Secondary | ICD-10-CM | POA: Insufficient documentation

## 2016-01-16 DIAGNOSIS — Z7902 Long term (current) use of antithrombotics/antiplatelets: Secondary | ICD-10-CM | POA: Diagnosis not present

## 2016-01-16 DIAGNOSIS — I739 Peripheral vascular disease, unspecified: Secondary | ICD-10-CM | POA: Insufficient documentation

## 2016-01-16 DIAGNOSIS — Z7952 Long term (current) use of systemic steroids: Secondary | ICD-10-CM | POA: Diagnosis not present

## 2016-01-16 HISTORY — DX: Interstitial pulmonary disease, unspecified: J84.9

## 2016-01-16 LAB — COMPREHENSIVE METABOLIC PANEL
ALK PHOS: 147 U/L — AB (ref 38–126)
ALT: 31 U/L (ref 14–54)
ANION GAP: 6 (ref 5–15)
AST: 62 U/L — ABNORMAL HIGH (ref 15–41)
Albumin: 2.8 g/dL — ABNORMAL LOW (ref 3.5–5.0)
BILIRUBIN TOTAL: 0.7 mg/dL (ref 0.3–1.2)
BUN: 14 mg/dL (ref 6–20)
CALCIUM: 8.1 mg/dL — AB (ref 8.9–10.3)
CO2: 27 mmol/L (ref 22–32)
CREATININE: 0.83 mg/dL (ref 0.44–1.00)
Chloride: 106 mmol/L (ref 101–111)
Glucose, Bld: 86 mg/dL (ref 65–99)
Potassium: 4.2 mmol/L (ref 3.5–5.1)
Sodium: 139 mmol/L (ref 135–145)
TOTAL PROTEIN: 6.3 g/dL — AB (ref 6.5–8.1)

## 2016-01-16 LAB — CBC
HCT: 41.2 % (ref 35.0–47.0)
HEMOGLOBIN: 13.6 g/dL (ref 12.0–16.0)
MCH: 32.3 pg (ref 26.0–34.0)
MCHC: 32.9 g/dL (ref 32.0–36.0)
MCV: 98.1 fL (ref 80.0–100.0)
Platelets: 109 10*3/uL — ABNORMAL LOW (ref 150–440)
RBC: 4.2 MIL/uL (ref 3.80–5.20)
RDW: 13 % (ref 11.5–14.5)
WBC: 5.4 10*3/uL (ref 3.6–11.0)

## 2016-01-16 LAB — TROPONIN I: Troponin I: <0.03 ng/mL (ref <0.031)

## 2016-01-16 LAB — BRAIN NATRIURETIC PEPTIDE: B NATRIURETIC PEPTIDE 5: 111 pg/mL — AB (ref 0.0–100.0)

## 2016-01-16 MED ORDER — NITROGLYCERIN 0.4 MG SL SUBL
0.4000 mg | SUBLINGUAL_TABLET | SUBLINGUAL | Status: DC | PRN
  Administered 2016-01-16 – 2016-01-17 (×2): 0.4 mg via SUBLINGUAL
  Filled 2016-01-16 (×2): qty 1

## 2016-01-16 MED ORDER — IOPAMIDOL (ISOVUE-370) INJECTION 76%
100.0000 mL | Freq: Once | INTRAVENOUS | Status: AC | PRN
  Administered 2016-01-16: 100 mL via INTRAVENOUS

## 2016-01-16 MED ORDER — ASPIRIN 81 MG PO CHEW
324.0000 mg | CHEWABLE_TABLET | Freq: Once | ORAL | Status: AC
  Administered 2016-01-16: 324 mg via ORAL
  Filled 2016-01-16: qty 4

## 2016-01-16 NOTE — ED Notes (Signed)
Pt reports improvement after nitrostat, refusing further nitro at this time

## 2016-01-16 NOTE — ED Notes (Signed)
Pt states that she started having chest pain and feeling bad yesterday with some sob, pt states that she has lupus and it hurts to breathe and it is causing her to wheeze

## 2016-01-16 NOTE — ED Provider Notes (Signed)
[facility] Emergency Department Provider Note  ____________________________________________  Time seen: 9:00 PM  I have reviewed the triage vital signs and the nursing notes.   HISTORY  Chief Complaint Chest Pain    HPI [name] is a 60 y.o. female complains of chest pain for the past 2 days associated with nausea vomiting diaphoresis and shortness of breath. Radiating to the right arm. Not exertional or pleuritic. Described as crushing and squeezing in the center of the chest. Last for 30-45 minutes at a time. Has medical history of vascular disease, lupus. Recently moved to this area from [place].     Past Medical History  Diagnosis Date  . Lupus (HCC)   . Stroke [facility])      There are no active problems to display for this patient.    Past Surgical History  Procedure Laterality Date  . Appendectomy    . Tonsillectomy    . Abdominal hysterectomy       Current Outpatient Rx  Name  Route  Sig  Dispense  Refill  . clopidogrel (PLAVIX) 75 MG tablet   Oral   Take 75 mg by mouth daily.         . furosemide (LASIX) 20 MG tablet   Oral   Take 20 mg by mouth.         . hydroxychloroquine (PLAQUENIL) 200 MG tablet   Oral   Take by mouth 2 (two) times daily.         . predniSONE (DELTASONE) 10 MG tablet   Oral   Take 10 mg by mouth daily with breakfast.         . sulfamethoxazole-trimethoprim (BACTRIM DS,SEPTRA DS) 800-160 MG tablet   Oral   Take 1 tablet by mouth 2 (two) times daily.   20 tablet   0   . traMADol (ULTRAM) 50 MG tablet   Oral   Take 1 tablet (50 mg total) by mouth every 6 (six) hours as needed.   20 tablet   0      Allergies Review of patient's allergies indicates no known allergies.   History reviewed. No pertinent family history.  Social History Social History  Substance Use Topics  . Smoking status: Former Games developer  . Smokeless tobacco: None  . Alcohol Use: No    Review of  Systems  Constitutional:   No fever or chills.  Eyes:   No vision changes.  ENT:   No sore throat. No rhinorrhea. Cardiovascular:   Positive chest pain. Respiratory:   Positive shortness of breath without cough. Gastrointestinal:   Negative for abdominal pain, vomiting and diarrhea.  Genitourinary:   Negative for dysuria or difficulty urinating. Musculoskeletal:   Negative for focal pain or swelling Neurological:   Negative for headaches 10-point ROS otherwise negative.  ____________________________________________   PHYSICAL EXAM:  VITAL SIGNS: ED Triage Vitals  Enc Vitals Group     BP 01/16/16 1904 175/94 mmHg     Pulse Rate 01/16/16 1904 71     Resp 01/16/16 1904 20     Temp 01/16/16 1904 98.5 F (36.9 C)     Temp Source 01/16/16 1904 Oral     SpO2 01/16/16 1904 98 %     Weight 01/16/16 1904 168 lb (76.204 kg)     Height 01/16/16 1904 5\' 2"  (1.575 m)     Head Cir --      Peak Flow --      Pain Score 01/16/16 1905 8  Pain Loc --      Pain Edu? --      Excl. in GC? --     Vital signs reviewed, nursing assessments reviewed.   Constitutional:   Alert and oriented. Well appearing and in no distress. Eyes:   No scleral icterus. No conjunctival pallor. PERRL. EOMI.  No nystagmus. ENT   Head:   Normocephalic and atraumatic.   Nose:   No congestion/rhinnorhea. No septal hematoma   Mouth/Throat:   MMM, no pharyngeal erythema. No peritonsillar mass.    Neck:   No stridor. No SubQ emphysema. No meningismus. Hematological/Lymphatic/Immunilogical:   No cervical lymphadenopathy. Cardiovascular:   RRR. Symmetric bilateral radial and DP pulses.  No murmurs.  Respiratory:   Normal respiratory effort without tachypnea nor retractions. Breath sounds are clear and equal bilaterally. No wheezes/rales/rhonchi. Gastrointestinal:   Soft and nontender. Non distended. There is no CVA tenderness.  No rebound, rigidity, or guarding. Genitourinary:   deferred Musculoskeletal:    Nontender with normal range of motion in all extremities. No joint effusions.  No lower extremity tenderness. Severe lymphedema bilateral lower extremities, symmetric but with bilateral calf tenderness. Neurologic:   Normal speech and language.  CN 2-10 normal. Motor grossly intact. No gross focal neurologic deficits are appreciated.  Skin:    Skin is warm, dry and intact. No rash noted.  No petechiae, purpura, or bullae.  ____________________________________________    LABS (pertinent positives/negatives) (all labs ordered are listed, but only abnormal results are displayed) Labs Reviewed  CBC - Abnormal; Notable for the following:    Platelets 109 (*)    All other components within normal limits  COMPREHENSIVE METABOLIC PANEL - Abnormal; Notable for the following:    Calcium 8.1 (*)    Total Protein 6.3 (*)    Albumin 2.8 (*)    AST 62 (*)    Alkaline Phosphatase 147 (*)    All other components within normal limits  BRAIN NATRIURETIC PEPTIDE - Abnormal; Notable for the following:    B Natriuretic Peptide 111.0 (*)    All other components within normal limits  TROPONIN I   ____________________________________________   EKG  Interpreted by me Normal sinus rhythm rate of 73, left axis, normal intervals. Poor progression in interpreter leads. Normal ST segments and T waves. There is isolated T-wave inversion in lead 3 which is nonspecific. Voltage criteria for LVH in the high lateral leads.  ____________________________________________    RADIOLOGY  CT angiogram chest unremarkable. Chest x-ray unremarkable Ultrasound bilateral lower extremities pending ____________________________________________   PROCEDURES   ____________________________________________   INITIAL IMPRESSION / ASSESSMENT AND PLAN / ED COURSE  Pertinent labs & imaging results that were available during my care of the patient were reviewed by me and considered in my medical decision making (see  chart for details).  Patient presents with precordial chest pain concerning for cardiac etiology. EKG shows no significant findings, troponin negative so far. With history of lupus, CT chest obtained to evaluate for PE which was negative. We'll plan to hospitalize for further evaluation and cardiac risk stratification.  ----------------------------------------- 11:17 PM on 01/16/2016 -----------------------------------------  Pain resolved after nitroglycerin. Patient given aspirin. We'll discussed with hospitalist.     ____________________________________________   FINAL CLINICAL IMPRESSION(S) / ED DIAGNOSES  Final diagnoses:  Edema  Leg pain  Precordial chest pain       Portions of this note were generated with dragon dictation software. Dictation errors may occur despite best attempts at proofreading.   Sharman Cheek, MD  01/16/16 2317 

## 2016-01-16 NOTE — ED Notes (Signed)
Pt's cousin, leaving number for updates, Luster Landsberg [phone]

## 2016-01-17 ENCOUNTER — Emergency Department: Payer: Medicare Other

## 2016-01-17 ENCOUNTER — Observation Stay
Admit: 2016-01-17 | Discharge: 2016-01-17 | Disposition: A | Discharge status: Home / Self Care | Payer: Medicare Other | Attending: Internal Medicine | Admitting: Internal Medicine

## 2016-01-17 ENCOUNTER — Encounter: Payer: Self-pay | Admitting: Internal Medicine

## 2016-01-17 DIAGNOSIS — R079 Chest pain, unspecified: Secondary | ICD-10-CM | POA: Diagnosis present

## 2016-01-17 DIAGNOSIS — IMO0002 Reserved for concepts with insufficient information to code with codable children: Secondary | ICD-10-CM | POA: Diagnosis present

## 2016-01-17 DIAGNOSIS — Z8673 Personal history of transient ischemic attack (TIA), and cerebral infarction without residual deficits: Secondary | ICD-10-CM

## 2016-01-17 DIAGNOSIS — M329 Systemic lupus erythematosus, unspecified: Secondary | ICD-10-CM | POA: Diagnosis present

## 2016-01-17 LAB — BASIC METABOLIC PANEL
ANION GAP: 6 (ref 5–15)
BUN: 14 mg/dL (ref 6–20)
CHLORIDE: 110 mmol/L (ref 101–111)
CO2: 24 mmol/L (ref 22–32)
Calcium: 7.8 mg/dL — ABNORMAL LOW (ref 8.9–10.3)
Creatinine, Ser: 0.84 mg/dL (ref 0.44–1.00)
GFR calc non Af Amer: >60 mL/min (ref >60)
Glucose, Bld: 66 mg/dL (ref 65–99)
POTASSIUM: 3.3 mmol/L — AB (ref 3.5–5.1)
SODIUM: 140 mmol/L (ref 135–145)

## 2016-01-17 LAB — CBC
HCT: 40.3 % (ref 35.0–47.0)
Hemoglobin: 13.5 g/dL (ref 12.0–16.0)
MCH: 32.6 pg (ref 26.0–34.0)
MCHC: 33.4 g/dL (ref 32.0–36.0)
MCV: 97.7 fL (ref 80.0–100.0)
Platelets: 90 10*3/uL — ABNORMAL LOW (ref 150–440)
RBC: 4.13 MIL/uL (ref 3.80–5.20)
RDW: 13.3 % (ref 11.5–14.5)
WBC: 5.9 10*3/uL (ref 3.6–11.0)

## 2016-01-17 LAB — TROPONIN I: Troponin I: 0.05 ng/mL — ABNORMAL HIGH (ref <0.031)

## 2016-01-17 MED ORDER — MORPHINE SULFATE (PF) 2 MG/ML IV SOLN
2.0000 mg | INTRAVENOUS | Status: DC | PRN
  Administered 2016-01-17 (×2): 2 mg via INTRAVENOUS
  Filled 2016-01-17 (×2): qty 1

## 2016-01-17 MED ORDER — ENSURE ENLIVE PO LIQD
237.0000 mL | ORAL | Status: DC
  Administered 2016-01-17 – 2016-01-19 (×2): 237 mL via ORAL

## 2016-01-17 MED ORDER — IPRATROPIUM-ALBUTEROL 0.5-2.5 (3) MG/3ML IN SOLN
3.0000 mL | RESPIRATORY_TRACT | Status: DC | PRN
Start: 2016-01-17 — End: 2016-01-19
  Administered 2016-01-17 – 2016-01-18 (×3): 3 mL via RESPIRATORY_TRACT
  Filled 2016-01-17 (×2): qty 3

## 2016-01-17 MED ORDER — PREDNISONE 10 MG PO TABS
10.0000 mg | ORAL_TABLET | Freq: Every day | ORAL | Status: DC
  Administered 2016-01-17 – 2016-01-19 (×3): 10 mg via ORAL
  Filled 2016-01-17 (×3): qty 1

## 2016-01-17 MED ORDER — SODIUM CHLORIDE 0.9 % IV SOLN
INTRAVENOUS | Status: AC
  Administered 2016-01-17: 03:00:00 via INTRAVENOUS

## 2016-01-17 MED ORDER — HYDROXYCHLOROQUINE SULFATE 200 MG PO TABS
200.0000 mg | ORAL_TABLET | Freq: Two times a day (BID) | ORAL | Status: DC
  Administered 2016-01-17 – 2016-01-19 (×5): 200 mg via ORAL
  Filled 2016-01-17 (×7): qty 1

## 2016-01-17 MED ORDER — ONDANSETRON HCL 4 MG/2ML IJ SOLN
4.0000 mg | Freq: Four times a day (QID) | INTRAMUSCULAR | Status: DC | PRN
  Administered 2016-01-17 – 2016-01-18 (×2): 4 mg via INTRAVENOUS
  Filled 2016-01-17 (×3): qty 2

## 2016-01-17 MED ORDER — SODIUM CHLORIDE 0.9% FLUSH
3.0000 mL | Freq: Two times a day (BID) | INTRAVENOUS | Status: DC
  Administered 2016-01-17 – 2016-01-18 (×5): 3 mL via INTRAVENOUS

## 2016-01-17 MED ORDER — IPRATROPIUM-ALBUTEROL 0.5-2.5 (3) MG/3ML IN SOLN
3.0000 mL | RESPIRATORY_TRACT | Status: DC
  Filled 2016-01-17: qty 3

## 2016-01-17 MED ORDER — FUROSEMIDE 40 MG PO TABS
20.0000 mg | ORAL_TABLET | Freq: Every day | ORAL | Status: DC
  Administered 2016-01-17 – 2016-01-18 (×2): 20 mg via ORAL
  Filled 2016-01-17 (×2): qty 1

## 2016-01-17 MED ORDER — ATORVASTATIN CALCIUM 20 MG PO TABS
40.0000 mg | ORAL_TABLET | Freq: Every day | ORAL | Status: DC
  Administered 2016-01-17 – 2016-01-19 (×3): 40 mg via ORAL
  Filled 2016-01-17 (×3): qty 2

## 2016-01-17 MED ORDER — IBUPROFEN 800 MG PO TABS
800.0000 mg | ORAL_TABLET | Freq: Once | ORAL | Status: AC
  Administered 2016-01-17: 800 mg via ORAL

## 2016-01-17 MED ORDER — IBUPROFEN 800 MG PO TABS
ORAL_TABLET | ORAL | Status: AC
  Administered 2016-01-17: 800 mg via ORAL
  Filled 2016-01-17: qty 1

## 2016-01-17 MED ORDER — CLOPIDOGREL BISULFATE 75 MG PO TABS
75.0000 mg | ORAL_TABLET | Freq: Every day | ORAL | Status: DC
  Administered 2016-01-17 – 2016-01-19 (×3): 75 mg via ORAL
  Filled 2016-01-17 (×3): qty 1

## 2016-01-17 MED ORDER — ENOXAPARIN SODIUM 40 MG/0.4ML ~~LOC~~ SOLN
40.0000 mg | SUBCUTANEOUS | Status: DC
  Administered 2016-01-17 – 2016-01-18 (×2): 40 mg via SUBCUTANEOUS
  Filled 2016-01-17 (×2): qty 0.4

## 2016-01-17 MED ORDER — ONDANSETRON HCL 4 MG PO TABS: 4.0000 mg | ORAL_TABLET | Freq: Four times a day (QID) | ORAL | Status: DC | PRN

## 2016-01-17 MED ORDER — OXYCODONE HCL 5 MG PO TABS
5.0000 mg | ORAL_TABLET | ORAL | Status: DC | PRN
  Administered 2016-01-17 – 2016-01-18 (×3): 5 mg via ORAL
  Filled 2016-01-17 (×3): qty 1

## 2016-01-17 NOTE — Progress Notes (Signed)
Initial Nutrition Assessment  DOCUMENTATION CODES:   Not applicable   INTERVENTION:   Cater to pt preferences as pt diet order just advanced to Regular Recommend Ensure Enlive po daily, each supplement provides 350 kcal and 20 grams of protein   NUTRITION DIAGNOSIS:   Inadequate oral intake related to poor appetite as evidenced by per patient/family report.  GOAL:   Patient will meet greater than or equal to 90% of their needs  MONITOR:   PO intake, I & O's, Weight trends, Labs, Supplement acceptance  REASON FOR ASSESSMENT:   Consult Poor PO  ASSESSMENT:   Pt admitted with chest pain with h/o known PVD and stroke.  Past Medical History  Diagnosis Date  . Lupus (HCC)   . Stroke (HCC)   . ILD (interstitial lung disease) (HCC)      Diet Order:  Diet regular Room service appropriate?: Yes; Fluid consistency:: Thin    Pt reports last meal of [employer] dinner last night and tolerated well. Pt reports her appetite has been good and asking when she can eat this morning as she was very hungry. Pt reports she makes her own meals usually likes spaghetti and ravioli. 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  Incoincedentally a family member called while RD was in room, Renee, who was asking RD to take down her number for pt as Luster Landsberg reports that pt was confused and unable to remember her number.   Medications: prednisone, Lasix Labs: K 3.3   Gastrointestinal Profile: Last BM:  01/16/2016   Nutrition-Focused Physical Exam Findings: Nutrition-Focused physical exam completed. Findings are WDL for fat depletion, muscle depletion, and edema.    Weight Change: Pt unable to tell writer her weight trend but felt she has been losing weight. Pt reports her weight she thinks was 160lbs.  RD weighed pt on visit to clarify, weight in [facility] matched 194lbs. Per [facility] weight trends, pt weight 150lbs last month.     Skin:  Reviewed, no issues   Height:   Ht Readings from Last 1  Encounters:  01/16/16 5\' 2"  (1.575 m)    Weight:   Wt Readings from Last 1 Encounters:  01/17/16 194 lb (87.998 kg)    BMI:  Body mass index is 35.47 kg/(m^2).    Estimated Nutritional Needs:   Kcal:  1840-2170kcals  Protein:  96-113g protein  Fluid:  >/= 1.8L fluid daily  EDUCATION NEEDS:   Education needs no appropriate at this time  03/18/16, RD, LDN Pager [phone] Weekend/On-Call Pager [phone]

## 2016-01-17 NOTE — Consult Note (Signed)
[facility] Cardiology Consultation Note  Patient ID: [name], MRN: [id number], DOB/AGE: 17-Aug-1955 60 y.o. Admit date: 01/16/2016   Date of Consult: 01/17/2016 Primary Physician: No PCP Per Patient Primary Cardiologist: None  Chief Complaint:  Chief Complaint  Patient presents with  . Chest Pain   Reason for Consult: chest pain with peripheral vascular disease  HPI: 60 y.o. female with known peripheral vascular disease status post previous stroke with some disability thereafter on Plavix and furosemide but no intake lipid medication management. The patient does not have a local physician and has come appear in the last few weeks. Patient has been seen for apparent problems with lower leg but there is no issues at this time. The patient additionally has had new onset of chest discomfort atypical in nature for which it's a relatively constant pain with any movement. When the patient gets up and moves around she has a pressure type symptoms radiating into her back is associated with shortness of breath. She also has some nausea which is not necessarily associated with this particular symptom. The patient has had a normal troponin and normal EKG without evidence of myocardial infarction at this time. Currently there is no evidence of abnormal chest x-ray and or congestive heart failure is seen with symptoms at this time. The patient is stable at this time and has had no significant symptoms overnight  Past Medical History  Diagnosis Date  . Lupus (HCC)   . Stroke (HCC)   . ILD (interstitial lung disease) [facility])       Surgical History:  Past Surgical History  Procedure Laterality Date  . Appendectomy    . Tonsillectomy    . Abdominal hysterectomy       Home Meds: Prior to Admission medications   Medication Sig Start Date End Date Taking? Authorizing Provider  clopidogrel (PLAVIX) 75 MG tablet Take 75 mg by mouth daily.   Yes Historical Provider, MD  furosemide (LASIX) 20 MG  tablet Take 20 mg by mouth.   Yes Historical Provider, MD  hydroxychloroquine (PLAQUENIL) 200 MG tablet Take 200 mg by mouth 2 (two) times daily.    Yes Historical Provider, MD  predniSONE (DELTASONE) 10 MG tablet Take 10 mg by mouth daily with breakfast.   Yes Historical Provider, MD    Inpatient Medications:  . clopidogrel  75 mg Oral Daily  . enoxaparin (LOVENOX) injection  40 mg Subcutaneous Q24H  . furosemide  20 mg Oral Daily  . hydroxychloroquine  200 mg Oral BID  . predniSONE  10 mg Oral Q breakfast  . sodium chloride flush  3 mL Intravenous Q12H   . sodium chloride 75 mL/hr at 01/17/16 0307    Allergies: No Known Allergies  Social History   Social History  . Marital Status: Divorced    Spouse Name: N/A  . Number of Children: N/A  . Years of Education: N/A   Occupational History  . Not on file.   Social History Main Topics  . Smoking status: Former Games developer  . Smokeless tobacco: Not on file  . Alcohol Use: No  . Drug Use: Not on file  . Sexual Activity: Not on file   Other Topics Concern  . Not on file   Social History Narrative     History reviewed. No pertinent family history.   Review of Systems Positive for chest pain shortness of breath nausea Negative for: General:  chills, fever, night sweats or weight changes.  Cardiovascular: PND orthopnea syncope dizziness  Dermatological skin lesions rashes Respiratory: Cough congestion Urologic: Frequent urination urination at night and hematuria Abdominal: negative for  vomiting, diarrhea, bright red blood per rectum, melena, or hematemesis Neurologic: negative for visual changes, and/or hearing changes  All other systems reviewed and are otherwise negative except as noted above.  Labs:  Recent Labs  01/16/16 1912 01/17/16 0500  TROPONINI <0.03 <0.03   Lab Results  Component Value Date   WBC 5.9 01/17/2016   HGB 13.5 01/17/2016   HCT 40.3 01/17/2016   MCV 97.7 01/17/2016   PLT 90* 01/17/2016     Recent Labs Lab 01/16/16 1912 01/17/16 0500  NA 139 140  K 4.2 3.3*  CL 106 110  CO2 27 24  BUN 14 14  CREATININE 0.83 0.84  CALCIUM 8.1* 7.8*  PROT 6.3*  --   BILITOT 0.7  --   ALKPHOS 147*  --   ALT 31  --   AST 62*  --   GLUCOSE 86 66   No results found for: CHOL, HDL, LDLCALC, TRIG No results found for: DDIMER  Radiology/Studies:  Dg Chest 2 View  01/16/2016  CLINICAL DATA:  Chest pain and shortness of breath beginning yesterday. Wheezing. Lupus. EXAM: CHEST  2 VIEW COMPARISON:  None. FINDINGS: The heart size and mediastinal contours are within normal limits. Elevation of right hemidiaphragm noted. No evidence of pulmonary infiltrate or edema. No evidence of pleural effusion or pneumothorax. Right upper quadrant surgical clips seen from prior cholecystectomy. IMPRESSION: Elevated right hemidiaphragm.  No active cardiopulmonary disease. Electronically Signed   By: Myles Rosenthal M.D.   On: 01/16/2016 19:36   Dg Tibia/fibula Right  12/21/2015  CLINICAL DATA:  Bilateral lower extremity pitting edema with ulcer to medial mid lower leg. Pain. EXAM: RIGHT TIBIA AND FIBULA - 2 VIEW COMPARISON:  05/16/2007 FINDINGS: The right tibia and fibula appear intact. No evidence of acute fracture or dislocation. Diffuse edema in the soft tissues. Prominent soft tissue calcifications around the knee, likely dystrophic. No change since previous study. Focal skin defect is demonstrated in the soft tissues over the medial aspect of the distal third of the tibia. No radiopaque soft tissue foreign bodies or soft tissue gas collections identified. IMPRESSION: Soft tissue edema and focal soft tissue laceration. No radiopaque foreign bodies. No acute bony abnormalities. Prominent old soft tissue calcifications about the knee, likely dystrophic. Electronically Signed   By: Burman Nieves M.D.   On: 12/21/2015 03:48   Ct Angio Chest Pe W/cm &/or Wo Cm  01/16/2016  CLINICAL DATA:  Chest pain and dyspnea.  History  of lupus. EXAM: CT ANGIOGRAPHY CHEST WITH CONTRAST TECHNIQUE: Multidetector CT imaging of the chest was performed using the standard protocol during bolus administration of intravenous contrast. Multiplanar CT image reconstructions and MIPs were obtained to evaluate the vascular anatomy. CONTRAST:  100 cc Isovue 370 intravenous COMPARISON:  None available FINDINGS: THORACIC INLET/BODY WALL: No acute abnormality. MEDIASTINUM: Normal heart size. No pericardial effusion. No evidence of pulmonary embolism or acute aortic syndrome. No adenopathy. Elevated right diaphragm without visible cause. LUNG WINDOWS: Central bronchial partial collapse. There is no edema, consolidation, effusion, or pneumothorax. Possible mild centrilobular emphysema at the apices. Right basilar atelectasis, mild. UPPER ABDOMEN: Lobulated cirrhotic appearance of the liver which has developed since prior. No splenomegaly or upper abdominal ascites. Two right hepatic cysts, the larger measuring 2 cm, mildly increased from 2008 comparison. Cholecystectomy. OSSEOUS: No acute fracture.  No suspicious lytic or blastic lesions. Review of the MIP  images confirms the above findings. IMPRESSION: 1. No evidence of pulmonary embolism or other acute finding. 2. Elevated right diaphragm with overlying atelectasis, new from 2008. No visible cause. 3. Bronchomalacia. 4. Cirrhosis. Electronically Signed   By: Marnee Spring M.D.   On: 01/16/2016 22:23   US Venous Img Lower Bilateral  01/17/2016  CLINICAL DATA:  Bilateral leg swelling and pain. EXAM: BILATERAL LOWER EXTREMITY VENOUS DOPPLER ULTRASOUND TECHNIQUE: Gray-scale sonography with graded compression, as well as color Doppler and duplex ultrasound were performed to evaluate the lower extremity deep venous systems from the level of the common femoral vein and including the common femoral, femoral, profunda femoral, popliteal and calf veins including the posterior tibial, peroneal and gastrocnemius veins  when visible. The superficial great saphenous vein was also interrogated. Spectral Doppler was utilized to evaluate flow at rest and with distal augmentation maneuvers in the common femoral, femoral and popliteal veins. COMPARISON:  None. FINDINGS: RIGHT LOWER EXTREMITY Common Femoral Vein: No evidence of thrombus. Normal compressibility, respiratory phasicity and response to augmentation. Saphenofemoral Junction: No evidence of thrombus. Profunda Femoral Vein: No evidence of thrombus. Femoral Vein: No evidence of thrombus. Popliteal Vein: No evidence of thrombus. Calf Veins: Could not be imaged due to wounds and overlying bandage. LEFT LOWER EXTREMITY Common Femoral Vein: No evidence of thrombus. Normal compressibility, respiratory phasicity and response to augmentation. Saphenofemoral Junction: No evidence of thrombus. Profunda Femoral Vein: No evidence of thrombus. Femoral Vein: No evidence of thrombus. Popliteal Vein: No evidence of thrombus. Calf Veins: No evidence of thrombus. IMPRESSION: 1. No evidence of DVT in either lower extremity. 2. Right calf veins could not be visualized due to overlying bandage. Electronically Signed   By: Marnee Spring M.D.   On: 01/17/2016 02:39    EKG: Normal sinus rhythm  Weights: Filed Weights   01/16/16 1904 01/17/16 0246  Weight: 168 lb (76.204 kg) 194 lb (87.998 kg)     Physical Exam: Blood pressure 151/113, pulse 84, temperature 97.5 F (36.4 C), temperature source Oral, resp. rate 16, height 5\' 2"  (1.575 m), weight 194 lb (87.998 kg), SpO2 97 %. Body mass index is 35.47 kg/(m^2). General: Well developed, well nourished, in no acute distress. Head eyes ears nose throat: Normocephalic, atraumatic, sclera non-icteric, no xanthomas, nares are without discharge. No apparent thyromegaly and/or mass  Lungs: Normal respiratory effort.  no wheezes, no rales, few rhonchi.  Heart: RRR with normal S1 S2. no murmur gallop, no rub, PMI is normal size and placement,  carotid upstroke normal without bruit, jugular venous pressure is normal Abdomen: Soft, non-tender, non-distended with normoactive bowel sounds. No hepatomegaly. No rebound/guarding. No obvious abdominal masses. Abdominal aorta is normal size without bruit Extremities: No edema. no cyanosis, no clubbing, no ulcers  Peripheral : 2+ bilateral upper extremity pulses, 2+ bilateral femoral pulses, 2+ bilateral dorsal pedal pulse Neuro: Alert and oriented. Patient does have some facial deficit and disability Moves all extremities spontaneously. Musculoskeletal: Normal muscle tone without kyphosis Psych:  Responds to questions appropriately with a normal affect.    Assessment: 60 year old female with previous peripheral vascular disease and stroke with residual abnormalities with unknown lipid management and no current evidence of hypertension having atypical chest pain without evidence of EKG changes and/or myocardial infarction or congestive heart failure  Plan: 1. Continue serial ECG and enzymes to assess for possible myocardial infarction 2. Begin ambulation and follow for any significant worsening of chest pain and assessment of clinical nature of true ischemia 3. Further consideration thereafter  of the stress test versus cardiac catheterization depending on significance of the chest pain clinically 4. Echocardiogram for LV systolic dysfunction 5. Continue antiplatelet medication management for previous stroke 6. High intensity cholesterol therapy with atorvastatin  Signed, Lamar Blinks M.D. [facility] Cardiology 01/17/2016, 6:16 AM

## 2016-01-17 NOTE — Consult Note (Signed)
WOC wound consult note Reason for Consult:Chronic LE wound in the presence of venous insufficiency. Patient is followed weekly at the outpatient wound care center for [facility]) application. Patient to resume care at that facility post discharge from acute care. Wound type:vensou insuficiency Pressure Ulcer POA: No Measurement: 6.5cm x 5cm with hypergranulation tissue present Wound bed:red, moist with hypergranulation tissue present; friable Drainage (amount, consistency, odor) Moderate to small amount of serous exudate. Note that patient has been in bed with LEs elevated since admission. Periwound:Intact, dry Dressing procedure/placement/frequency:Profore dressing is removed and leg is cleansed and gently patted dry.  An absorbant silicone foam dressing is placed to manage exudate; this is followed by the application of an Unna's Boot and topped with 4-inch Coban-like self adherent dressing for compression.  The patient's stockinette is reapplied. If she is still in house next Friday, June 9th, WOC Nurse will change boot, otherwise patient to resume care at the [facility] and should call for an appointment. WOC nursing team will follow, and will remain available to this patient, the nursing and medical teams.  Please contact WOC nurse if patient still in house on 01/23/16.. Thanks, Ladona Mow, MSN, RN, GNP, [place]  Pager# [phone]

## 2016-01-17 NOTE — Progress Notes (Signed)
*  PRELIMINARY RESULTS* Echocardiogram 2D Echocardiogram has been performed.  [name] 01/17/2016, 10:22 AM

## 2016-01-17 NOTE — H&P (Signed)
[facility] - [place] at [facility]   PATIENT NAME: [name]    MR#:  [id number]  DATE OF BIRTH:  07-21-1956  DATE OF ADMISSION:  01/16/2016  PRIMARY CARE PHYSICIAN: No PCP Per Patient   REQUESTING/REFERRING PHYSICIAN: Scotty Court, MD  CHIEF COMPLAINT:   Chief Complaint  Patient presents with  . Chest Pain    HISTORY OF PRESENT ILLNESS:  Leeba Barbe  is a 60 y.o. female who presents with Intermittent chest pressure for the past 2 days. Patient states that the pain is central, squeezing and pressure-like, nonradiating, worse with exertion and improves some with rest. Initial workup here in the ED largely within normal limits. Patient states she's been having some leg pain and swelling as well, but CTA of her chest showed no PE and Dopplers of bilateral lower extremity showed no DVT. Hospitalists were called for admission for further evaluation of complaint of chest pain.  PAST MEDICAL HISTORY:   Past Medical History  Diagnosis Date  . Lupus (HCC)   . Stroke (HCC)   . ILD (interstitial lung disease) (HCC)     PAST SURGICAL HISTORY:   Past Surgical History  Procedure Laterality Date  . Appendectomy    . Tonsillectomy    . Abdominal hysterectomy      SOCIAL HISTORY:   Social History  Substance Use Topics  . Smoking status: Former Games developer  . Smokeless tobacco: Not on file  . Alcohol Use: No    FAMILY HISTORY:  History reviewed. No pertinent family history.  DRUG ALLERGIES:  No Known Allergies  MEDICATIONS AT HOME:   Prior to Admission medications   Medication Sig Start Date End Date Taking? Authorizing Provider  clopidogrel (PLAVIX) 75 MG tablet Take 75 mg by mouth daily.   Yes Historical Provider, MD  furosemide (LASIX) 20 MG tablet Take 20 mg by mouth.   Yes Historical Provider, MD  hydroxychloroquine (PLAQUENIL) 200 MG tablet Take by mouth 2 (two) times daily.   Yes Historical Provider, MD  predniSONE (DELTASONE) 10 MG tablet  Take 10 mg by mouth daily with breakfast.   Yes Historical Provider, MD    REVIEW OF SYSTEMS:  Review of Systems  Constitutional: Negative for fever, chills, weight loss and malaise/fatigue.  HENT: Negative for ear pain, hearing loss and tinnitus.   Eyes: Negative for blurred vision, double vision, pain and redness.  Respiratory: Positive for shortness of breath. Negative for cough and hemoptysis.   Cardiovascular: Positive for chest pain. Negative for palpitations, orthopnea and leg swelling.  Gastrointestinal: Negative for nausea, vomiting, abdominal pain, diarrhea and constipation.  Genitourinary: Negative for dysuria, frequency and hematuria.  Musculoskeletal: Negative for back pain, joint pain and neck pain.  Skin:       No acne, rash, or lesions  Neurological: Negative for dizziness, tremors, focal weakness and weakness.  Endo/Heme/Allergies: Negative for polydipsia. Does not bruise/bleed easily.  Psychiatric/Behavioral: Negative for depression. The patient is not nervous/anxious and does not have insomnia.      VITAL SIGNS:   Filed Vitals:   01/16/16 2050 01/16/16 2230 01/16/16 2306 01/17/16 0015  BP: 174/124 151/96 157/90 157/99  Pulse: 77 70 85 85  Temp:      TempSrc:      Resp: 20 15 13 16   Height:      Weight:      SpO2: 100% 100% 100% 100%   Wt Readings from Last 3 Encounters:  01/16/16 76.204 kg (168 lb)  12/20/15 68.04 kg (150  lb)    PHYSICAL EXAMINATION:  Physical Exam  Vitals reviewed. Constitutional: She is oriented to person, place, and time. She appears well-developed and well-nourished. She appears distressed (Appears uncomfortable).  HENT:  Head: Normocephalic and atraumatic.  Mouth/Throat: Oropharynx is clear and moist.  Eyes: Conjunctivae and EOM are normal. Pupils are equal, round, and reactive to light. No scleral icterus.  Neck: Normal range of motion. Neck supple. No JVD present. No thyromegaly present.  Cardiovascular: Normal rate, regular  rhythm and intact distal pulses.  Exam reveals no gallop and no friction rub.   No murmur heard. Respiratory: Effort normal and breath sounds normal. No respiratory distress. She has no wheezes. She has no rales.  GI: Soft. Bowel sounds are normal. She exhibits no distension. There is no tenderness.  Musculoskeletal: Normal range of motion. She exhibits no edema.  No arthritis, no gout  Lymphadenopathy:    She has no cervical adenopathy.  Neurological: She is alert and oriented to person, place, and time. No cranial nerve deficit.  No dysarthria, no aphasia  Skin: Skin is warm and dry. No rash noted. No erythema.  Psychiatric: She has a normal mood and affect. Her behavior is normal. Judgment and thought content normal.    LABORATORY PANEL:   CBC  Recent Labs Lab 01/16/16 1912  WBC 5.4  HGB 13.6  HCT 41.2  PLT 109*   ------------------------------------------------------------------------------------------------------------------  Chemistries   Recent Labs Lab 01/16/16 1912  NA 139  K 4.2  CL 106  CO2 27  GLUCOSE 86  BUN 14  CREATININE 0.83  CALCIUM 8.1*  AST 62*  ALT 31  ALKPHOS 147*  BILITOT 0.7   ------------------------------------------------------------------------------------------------------------------  Cardiac Enzymes  Recent Labs Lab 01/16/16 1912  TROPONINI <0.03   ------------------------------------------------------------------------------------------------------------------  RADIOLOGY:  Dg Chest 2 View  01/16/2016  CLINICAL DATA:  Chest pain and shortness of breath beginning yesterday. Wheezing. Lupus. EXAM: CHEST  2 VIEW COMPARISON:  None. FINDINGS: The heart size and mediastinal contours are within normal limits. Elevation of right hemidiaphragm noted. No evidence of pulmonary infiltrate or edema. No evidence of pleural effusion or pneumothorax. Right upper quadrant surgical clips seen from prior cholecystectomy. IMPRESSION: Elevated right  hemidiaphragm.  No active cardiopulmonary disease. Electronically Signed   By: Myles Rosenthal M.D.   On: 01/16/2016 19:36   Ct Angio Chest Pe W/cm &/or Wo Cm  01/16/2016  CLINICAL DATA:  Chest pain and dyspnea.  History of lupus. EXAM: CT ANGIOGRAPHY CHEST WITH CONTRAST TECHNIQUE: Multidetector CT imaging of the chest was performed using the standard protocol during bolus administration of intravenous contrast. Multiplanar CT image reconstructions and MIPs were obtained to evaluate the vascular anatomy. CONTRAST:  100 cc Isovue 370 intravenous COMPARISON:  None available FINDINGS: THORACIC INLET/BODY WALL: No acute abnormality. MEDIASTINUM: Normal heart size. No pericardial effusion. No evidence of pulmonary embolism or acute aortic syndrome. No adenopathy. Elevated right diaphragm without visible cause. LUNG WINDOWS: Central bronchial partial collapse. There is no edema, consolidation, effusion, or pneumothorax. Possible mild centrilobular emphysema at the apices. Right basilar atelectasis, mild. UPPER ABDOMEN: Lobulated cirrhotic appearance of the liver which has developed since prior. No splenomegaly or upper abdominal ascites. Two right hepatic cysts, the larger measuring 2 cm, mildly increased from 2008 comparison. Cholecystectomy. OSSEOUS: No acute fracture.  No suspicious lytic or blastic lesions. Review of the MIP images confirms the above findings. IMPRESSION: 1. No evidence of pulmonary embolism or other acute finding. 2. Elevated right diaphragm with overlying atelectasis, new from  2008. No visible cause. 3. Bronchomalacia. 4. Cirrhosis. Electronically Signed   By: Marnee Spring M.D.   On: 01/16/2016 22:23    EKG:   Orders placed or performed in visit on 01/16/16  . EKG 12-Lead    IMPRESSION AND PLAN:  Principal Problem:   Chest pain - symptoms are very consistent with typical cardiac pain, however, initial workup largely negative. We will admit her to telemetry, trend her enzymes, get an  echocardiogram and cardiology consult. When necessary nitroglycerin as she states that this helped her some in the ED. Active Problems:   Lupus (HCC) - continue home meds   H/O: stroke - continue home meds  All the records are reviewed and case discussed with ED provider. Management plans discussed with the patient and/or family.  DVT PROPHYLAXIS: SubQ lovenox  GI PROPHYLAXIS: None  ADMISSION STATUS: Observation  CODE STATUS: Full Code Status History    This patient does not have a recorded code status. Please follow your organizational policy for patients in this situation.      TOTAL TIME TAKING CARE OF THIS PATIENT: 40 minutes.    Judithe Keetch FIELDING 01/17/2016, 1:29 AM  [facility]  Office  [phone]  CC: Primary care physician; No PCP Per Patient

## 2016-01-17 NOTE — ED Notes (Signed)
Pt in [place], report called and to transport once pt returns from [place].

## 2016-01-17 NOTE — Progress Notes (Signed)
A&O. Admitted from home due to chest pain. Having ongoing chest pain. Morphine and nitroglycerin given. Dr. Anne Hahn in to see. Wound consult ordered as patient states she has wound on right leg that is wrapped in an unna boot. Up with one assist to [facility] IVF infusing.

## 2016-01-18 LAB — ECHOCARDIOGRAM COMPLETE
HEIGHTINCHES: 62 in
Weight: 3104 oz

## 2016-01-18 LAB — TSH: TSH: 3.189 u[IU]/mL (ref 0.350–4.500)

## 2016-01-18 MED ORDER — DOCUSATE SODIUM 100 MG PO CAPS
100.0000 mg | ORAL_CAPSULE | Freq: Two times a day (BID) | ORAL | Status: DC
  Administered 2016-01-18 – 2016-01-19 (×3): 100 mg via ORAL
  Filled 2016-01-18 (×3): qty 1

## 2016-01-18 MED ORDER — POLYETHYLENE GLYCOL 3350 17 G PO PACK
17.0000 g | PACK | Freq: Every day | ORAL | Status: DC
  Administered 2016-01-18 – 2016-01-19 (×2): 17 g via ORAL
  Filled 2016-01-18 (×2): qty 1

## 2016-01-18 NOTE — Progress Notes (Signed)
[facility] - [place] at [facility]   PATIENT NAME: [name]    MRN#:  [id number]  DATE OF BIRTH:  March 07, 1956  SUBJECTIVE:  Hospital Day: none [name] is a 60 y.o. female presenting with Chest Pain .   Overnight events: No overnight events Interval Events: Still complains of some chest pain and palpitations  REVIEW OF SYSTEMS:  CONSTITUTIONAL: No fever, fatigue or weakness.  EYES: No blurred or double vision.  EARS, NOSE, AND THROAT: No tinnitus or ear pain.  RESPIRATORY: No cough, shortness of breath, wheezing or hemoptysis.  CARDIOVASCULAR: Positive chest pain, denies orthopnea, edema.  GASTROINTESTINAL: No nausea, vomiting, diarrhea or abdominal pain.  GENITOURINARY: No dysuria, hematuria.  ENDOCRINE: No polyuria, nocturia,  HEMATOLOGY: No anemia, easy bruising or bleeding SKIN: No rash or lesion. MUSCULOSKELETAL: No joint pain or arthritis.   NEUROLOGIC: No tingling, numbness, weakness.  PSYCHIATRY: No anxiety or depression.   DRUG ALLERGIES:  No Known Allergies  VITALS:  Blood pressure 126/72, pulse 94, temperature 97.5 F (36.4 C), temperature source Axillary, resp. rate 18, height 5\' 2"  (1.575 m), weight 194 lb (87.998 kg), SpO2 98 %.  PHYSICAL EXAMINATION:  VITAL SIGNS: Filed Vitals:   01/18/16 0353 01/18/16 1153  BP: 111/70 126/72  Pulse: 110 94  Temp: 98 F (36.7 C) 97.5 F (36.4 C)  Resp: 18 18   GENERAL:59 y.o.female currently in no acute distress.  HEAD: Normocephalic, atraumatic.  EYES: Pupils equal, round, reactive to light. Extraocular muscles intact. No scleral icterus.  MOUTH: Moist mucosal membrane. Dentition intact. No abscess noted.  EAR, NOSE, THROAT: Clear without exudates. No external lesions.  NECK: Supple. No thyromegaly. No nodules. No JVD.  PULMONARY: Clear to ascultation, without wheeze rails or rhonci. No use of accessory muscles, Good respiratory effort. good air entry bilaterally CHEST:  Nontender to palpation.  CARDIOVASCULAR: S1 and S2. Tachycardic. No murmurs, rubs, or gallops. No edema. Pedal pulses 2+ bilaterally.  GASTROINTESTINAL: Soft, nontender, nondistended. No masses. Positive bowel sounds. No hepatosplenomegaly.  MUSCULOSKELETAL: No swelling, clubbing, or edema. Range of motion full in all extremities.  NEUROLOGIC: Cranial nerves II through XII are intact. No gross focal neurological deficits. Sensation intact. Reflexes intact.  SKIN: No ulceration, lesions, rashes, or cyanosis. Skin warm and dry. Turgor intact.  PSYCHIATRIC: Mood, affect within normal limits. The patient is awake, alert and oriented x 3. Insight, judgment intact.      LABORATORY PANEL:   CBC  Recent Labs Lab 01/17/16 0500  WBC 5.9  HGB 13.5  HCT 40.3  PLT 90*   ------------------------------------------------------------------------------------------------------------------  Chemistries   Recent Labs Lab 01/16/16 1912 01/17/16 0500  NA 139 140  K 4.2 3.3*  CL 106 110  CO2 27 24  GLUCOSE 86 66  BUN 14 14  CREATININE 0.83 0.84  CALCIUM 8.1* 7.8*  AST 62*  --   ALT 31  --   ALKPHOS 147*  --   BILITOT 0.7  --    ------------------------------------------------------------------------------------------------------------------  Cardiac Enzymes  Recent Labs Lab 01/17/16 2250  TROPONINI <0.03   ------------------------------------------------------------------------------------------------------------------  RADIOLOGY:  Dg Chest 2 View  01/16/2016  CLINICAL DATA:  Chest pain and shortness of breath beginning yesterday. Wheezing. Lupus. EXAM: CHEST  2 VIEW COMPARISON:  None. FINDINGS: The heart size and mediastinal contours are within normal limits. Elevation of right hemidiaphragm noted. No evidence of pulmonary infiltrate or edema. No evidence of pleural effusion or pneumothorax. Right upper quadrant surgical clips seen from prior cholecystectomy. IMPRESSION: Elevated  right hemidiaphragm.  No active cardiopulmonary disease. Electronically Signed   By: Myles Rosenthal M.D.   On: 01/16/2016 19:36   Ct Angio Chest Pe W/cm &/or Wo Cm  01/16/2016  CLINICAL DATA:  Chest pain and dyspnea.  History of lupus. EXAM: CT ANGIOGRAPHY CHEST WITH CONTRAST TECHNIQUE: Multidetector CT imaging of the chest was performed using the standard protocol during bolus administration of intravenous contrast. Multiplanar CT image reconstructions and MIPs were obtained to evaluate the vascular anatomy. CONTRAST:  100 cc Isovue 370 intravenous COMPARISON:  None available FINDINGS: THORACIC INLET/BODY WALL: No acute abnormality. MEDIASTINUM: Normal heart size. No pericardial effusion. No evidence of pulmonary embolism or acute aortic syndrome. No adenopathy. Elevated right diaphragm without visible cause. LUNG WINDOWS: Central bronchial partial collapse. There is no edema, consolidation, effusion, or pneumothorax. Possible mild centrilobular emphysema at the apices. Right basilar atelectasis, mild. UPPER ABDOMEN: Lobulated cirrhotic appearance of the liver which has developed since prior. No splenomegaly or upper abdominal ascites. Two right hepatic cysts, the larger measuring 2 cm, mildly increased from 2008 comparison. Cholecystectomy. OSSEOUS: No acute fracture.  No suspicious lytic or blastic lesions. Review of the MIP images confirms the above findings. IMPRESSION: 1. No evidence of pulmonary embolism or other acute finding. 2. Elevated right diaphragm with overlying atelectasis, new from 2008. No visible cause. 3. Bronchomalacia. 4. Cirrhosis. Electronically Signed   By: Marnee Spring M.D.   On: 01/16/2016 22:23   US Venous Img Lower Bilateral  01/17/2016  CLINICAL DATA:  Bilateral leg swelling and pain. EXAM: BILATERAL LOWER EXTREMITY VENOUS DOPPLER ULTRASOUND TECHNIQUE: Gray-scale sonography with graded compression, as well as color Doppler and duplex ultrasound were performed to evaluate the lower  extremity deep venous systems from the level of the common femoral vein and including the common femoral, femoral, profunda femoral, popliteal and calf veins including the posterior tibial, peroneal and gastrocnemius veins when visible. The superficial great saphenous vein was also interrogated. Spectral Doppler was utilized to evaluate flow at rest and with distal augmentation maneuvers in the common femoral, femoral and popliteal veins. COMPARISON:  None. FINDINGS: RIGHT LOWER EXTREMITY Common Femoral Vein: No evidence of thrombus. Normal compressibility, respiratory phasicity and response to augmentation. Saphenofemoral Junction: No evidence of thrombus. Profunda Femoral Vein: No evidence of thrombus. Femoral Vein: No evidence of thrombus. Popliteal Vein: No evidence of thrombus. Calf Veins: Could not be imaged due to wounds and overlying bandage. LEFT LOWER EXTREMITY Common Femoral Vein: No evidence of thrombus. Normal compressibility, respiratory phasicity and response to augmentation. Saphenofemoral Junction: No evidence of thrombus. Profunda Femoral Vein: No evidence of thrombus. Femoral Vein: No evidence of thrombus. Popliteal Vein: No evidence of thrombus. Calf Veins: No evidence of thrombus. IMPRESSION: 1. No evidence of DVT in either lower extremity. 2. Right calf veins could not be visualized due to overlying bandage. Electronically Signed   By: Marnee Spring M.D.   On: 01/17/2016 02:39    EKG:   Orders placed or performed in visit on 01/16/16  . EKG 12-Lead  . EKG 12-Lead    ASSESSMENT AND PLAN:   [name] is a 60 y.o. female presenting with Chest Pain . Admitted 01/16/2016 : Day #: none 1. Chest pain, central: Cardiology recommendations appreciated, patient for stress testing 2. Sinus tachycardia: Check TSH check EKG if sustained continue telemetry looking for further arrhythmias 3. Lupus continue with Plaquenil   All the records are reviewed and case discussed with Care  Management/Social Workerr. Management plans discussed with the patient,  family and they are in agreement.  CODE STATUS: full TOTAL TIME TAKING CARE OF THIS PATIENT: 28 minutes.   POSSIBLE D/C IN 1-2DAYS, DEPENDING ON CLINICAL CONDITION.   Hower,  Mardi Mainland.D on 01/18/2016 at 12:25 PM  Between 7am to 6pm - Pager - [phone]  After 6pm: House Pager: - [phone]  [facility]  Office  [phone]  CC: Primary care physician; No PCP Per Patient

## 2016-01-18 NOTE — Progress Notes (Signed)
Patient has an appointment with Dr. Sherrie Mustache on Tuesday per patient's family. Requested for patient's information to be faxed over to office. Will pass on in report so care management can address tomorrow.

## 2016-01-18 NOTE — Progress Notes (Signed)
[facility] Cardiology [facility] Encounter Note  Patient: [name] / Admit Date: 01/16/2016 / Date of Encounter: 01/18/2016, 3:00 PM   Subjective: Patient still weak fatigue with some atypical chest discomfort and shortness of breath. No evidence of myocardial infarction  Review of Systems: Positive for: Chest pain shortness of breath Negative for: Vision change, hearing change, syncope, dizziness, nausea, vomiting,diarrhea, bloody stool, stomach pain, cough, congestion, diaphoresis, urinary frequency, urinary pain,skin lesions, skin rashes Others previously listed  Objective: Telemetry: Normal sinus rhythm Physical Exam: Blood pressure 126/72, pulse 94, temperature 97.5 F (36.4 C), temperature source Axillary, resp. rate 18, height 5\' 2"  (1.575 m), weight 194 lb (87.998 kg), SpO2 98 %. Body mass index is 35.47 kg/(m^2). General: Well developed, well nourished, in no acute distress. Head: Normocephalic, atraumatic, sclera non-icteric, no xanthomas, nares are without discharge. Neck: No apparent masses Lungs: Normal respirations with some wheezes, no rhonchi, no rales , few crackles   Heart: Regular rate and rhythm, normal S1 S2, no murmur, no rub, no gallop, PMI is normal size and placement, carotid upstroke normal without bruit, jugular venous pressure normal Abdomen: Soft, non-tender, non-distended with normoactive bowel sounds. No hepatosplenomegaly. Abdominal aorta is normal size without bruit Extremities: Trace edema, no clubbing, no cyanosis, no ulcers,  Peripheral: 2+ radial, 2+ femoral, 2+ dorsal pedal pulses Neuro: Alert and oriented. Moves all extremities spontaneously. Psych:  Responds to questions appropriately with a normal affect.   Intake/Output Summary (Last 24 hours) at 01/18/16 1500 Last data filed at 01/18/16 1035  Gross per 24 hour  Intake      3 ml  Output    325 ml  Net   -322 ml    Inpatient Medications:  . atorvastatin  40 mg Oral q1800  .  clopidogrel  75 mg Oral Daily  . docusate sodium  100 mg Oral BID  . enoxaparin (LOVENOX) injection  40 mg Subcutaneous Q24H  . feeding supplement (ENSURE ENLIVE)  237 mL Oral Q24H  . hydroxychloroquine  200 mg Oral BID  . polyethylene glycol  17 g Oral Daily  . predniSONE  10 mg Oral Q breakfast  . sodium chloride flush  3 mL Intravenous Q12H   Infusions:    Labs:  Recent Labs  01/16/16 1912 01/17/16 0500  NA 139 140  K 4.2 3.3*  CL 106 110  CO2 27 24  GLUCOSE 86 66  BUN 14 14  CREATININE 0.83 0.84  CALCIUM 8.1* 7.8*    Recent Labs  01/16/16 1912  AST 62*  ALT 31  ALKPHOS 147*  BILITOT 0.7  PROT 6.3*  ALBUMIN 2.8*    Recent Labs  01/16/16 1912 01/17/16 0500  WBC 5.4 5.9  HGB 13.6 13.5  HCT 41.2 40.3  MCV 98.1 97.7  PLT 109* 90*    Recent Labs  01/17/16 0500 01/17/16 1242 01/17/16 1824 01/17/16 2250  TROPONINI <0.03 <0.03 0.05* <0.03   Invalid input(s): POCBNP No results for input(s): HGBA1C in the last 72 hours.   Weights: Filed Weights   01/16/16 1904 01/17/16 0246  Weight: 168 lb (76.204 kg) 194 lb (87.998 kg)     Radiology/Studies:  Dg Chest 2 View  01/16/2016  CLINICAL DATA:  Chest pain and shortness of breath beginning yesterday. Wheezing. Lupus. EXAM: CHEST  2 VIEW COMPARISON:  None. FINDINGS: The heart size and mediastinal contours are within normal limits. Elevation of right hemidiaphragm noted. No evidence of pulmonary infiltrate or edema. No evidence of pleural effusion or pneumothorax.  Right upper quadrant surgical clips seen from prior cholecystectomy. IMPRESSION: Elevated right hemidiaphragm.  No active cardiopulmonary disease. Electronically Signed   By: Myles Rosenthal M.D.   On: 01/16/2016 19:36   Dg Tibia/fibula Right  12/21/2015  CLINICAL DATA:  Bilateral lower extremity pitting edema with ulcer to medial mid lower leg. Pain. EXAM: RIGHT TIBIA AND FIBULA - 2 VIEW COMPARISON:  05/16/2007 FINDINGS: The right tibia and fibula appear  intact. No evidence of acute fracture or dislocation. Diffuse edema in the soft tissues. Prominent soft tissue calcifications around the knee, likely dystrophic. No change since previous study. Focal skin defect is demonstrated in the soft tissues over the medial aspect of the distal third of the tibia. No radiopaque soft tissue foreign bodies or soft tissue gas collections identified. IMPRESSION: Soft tissue edema and focal soft tissue laceration. No radiopaque foreign bodies. No acute bony abnormalities. Prominent old soft tissue calcifications about the knee, likely dystrophic. Electronically Signed   By: Burman Nieves M.D.   On: 12/21/2015 03:48   Ct Angio Chest Pe W/cm &/or Wo Cm  01/16/2016  CLINICAL DATA:  Chest pain and dyspnea.  History of lupus. EXAM: CT ANGIOGRAPHY CHEST WITH CONTRAST TECHNIQUE: Multidetector CT imaging of the chest was performed using the standard protocol during bolus administration of intravenous contrast. Multiplanar CT image reconstructions and MIPs were obtained to evaluate the vascular anatomy. CONTRAST:  100 cc Isovue 370 intravenous COMPARISON:  None available FINDINGS: THORACIC INLET/BODY WALL: No acute abnormality. MEDIASTINUM: Normal heart size. No pericardial effusion. No evidence of pulmonary embolism or acute aortic syndrome. No adenopathy. Elevated right diaphragm without visible cause. LUNG WINDOWS: Central bronchial partial collapse. There is no edema, consolidation, effusion, or pneumothorax. Possible mild centrilobular emphysema at the apices. Right basilar atelectasis, mild. UPPER ABDOMEN: Lobulated cirrhotic appearance of the liver which has developed since prior. No splenomegaly or upper abdominal ascites. Two right hepatic cysts, the larger measuring 2 cm, mildly increased from 2008 comparison. Cholecystectomy. OSSEOUS: No acute fracture.  No suspicious lytic or blastic lesions. Review of the MIP images confirms the above findings. IMPRESSION: 1. No evidence  of pulmonary embolism or other acute finding. 2. Elevated right diaphragm with overlying atelectasis, new from 2008. No visible cause. 3. Bronchomalacia. 4. Cirrhosis. Electronically Signed   By: Marnee Spring M.D.   On: 01/16/2016 22:23   US Venous Img Lower Bilateral  01/17/2016  CLINICAL DATA:  Bilateral leg swelling and pain. EXAM: BILATERAL LOWER EXTREMITY VENOUS DOPPLER ULTRASOUND TECHNIQUE: Gray-scale sonography with graded compression, as well as color Doppler and duplex ultrasound were performed to evaluate the lower extremity deep venous systems from the level of the common femoral vein and including the common femoral, femoral, profunda femoral, popliteal and calf veins including the posterior tibial, peroneal and gastrocnemius veins when visible. The superficial great saphenous vein was also interrogated. Spectral Doppler was utilized to evaluate flow at rest and with distal augmentation maneuvers in the common femoral, femoral and popliteal veins. COMPARISON:  None. FINDINGS: RIGHT LOWER EXTREMITY Common Femoral Vein: No evidence of thrombus. Normal compressibility, respiratory phasicity and response to augmentation. Saphenofemoral Junction: No evidence of thrombus. Profunda Femoral Vein: No evidence of thrombus. Femoral Vein: No evidence of thrombus. Popliteal Vein: No evidence of thrombus. Calf Veins: Could not be imaged due to wounds and overlying bandage. LEFT LOWER EXTREMITY Common Femoral Vein: No evidence of thrombus. Normal compressibility, respiratory phasicity and response to augmentation. Saphenofemoral Junction: No evidence of thrombus. Profunda Femoral Vein: No evidence of  thrombus. Femoral Vein: No evidence of thrombus. Popliteal Vein: No evidence of thrombus. Calf Veins: No evidence of thrombus. IMPRESSION: 1. No evidence of DVT in either lower extremity. 2. Right calf veins could not be visualized due to overlying bandage. Electronically Signed   By: Marnee Spring M.D.   On:  01/17/2016 02:39     Assessment and Recommendation  60 y.o. female with significant cardiovascular risk factors with new onset of chest discomfort atypical in nature waxing and waning without evidence of myocardial infarction and/or congestive heart failure at this time 1. Tinea ambulate and follow for any worsening symptoms 2. Plavix for peripheral vascular disease and coronary artery disease risk reduction 3. High intensity cholesterol therapy for vascular disease and previous cerebral vascular accident 4. Possible discharged home if ambulating well with outpatient stress test Signed, Arnoldo Hooker M.D. FACC

## 2016-01-19 ENCOUNTER — Observation Stay (HOSPITAL_BASED_OUTPATIENT_CLINIC_OR_DEPARTMENT_OTHER): Payer: Medicare Other

## 2016-01-19 DIAGNOSIS — R079 Chest pain, unspecified: Secondary | ICD-10-CM

## 2016-01-19 LAB — NM MYOCAR MULTI W/SPECT W/WALL MOTION / EF
CHL CUP NUCLEAR SRS: 13
CHL CUP NUCLEAR SSS: 3
LV sys vol: 13 mL
LVDIAVOL: 46 mL (ref 46–106)
NUC STRESS TID: 0.74
Peak HR: 114 {beats}/min
Percent HR: 71 %
Rest HR: 88 {beats}/min
SDS: 0

## 2016-01-19 MED ORDER — REGADENOSON 0.4 MG/5ML IV SOLN
0.4000 mg | Freq: Once | INTRAVENOUS | Status: DC
  Filled 2016-01-19: qty 5

## 2016-01-19 MED ORDER — ATORVASTATIN CALCIUM 40 MG PO TABS: 40.0000 mg | ORAL_TABLET | Freq: Every day | ORAL | Status: AC

## 2016-01-19 MED ORDER — NITROGLYCERIN 0.4 MG SL SUBL: 0.4000 mg | SUBLINGUAL_TABLET | SUBLINGUAL | Status: AC | PRN

## 2016-01-19 MED ORDER — TECHNETIUM TC 99M TETROFOSMIN IV KIT
30.0000 | PACK | Freq: Once | INTRAVENOUS | Status: AC | PRN
  Administered 2016-01-19: 32.363 via INTRAVENOUS

## 2016-01-19 MED ORDER — TECHNETIUM TC 99M TETROFOSMIN IV KIT
13.7600 | PACK | Freq: Once | INTRAVENOUS | Status: AC | PRN
  Administered 2016-01-19: 13.76 via INTRAVENOUS

## 2016-01-19 NOTE — Progress Notes (Signed)
Discharge instructions explained to pt and pts family/ verbalized an understanding/ iv and tele removed/ rx given to family/ transferred off unit via wheelchair.

## 2016-01-19 NOTE — Discharge Summary (Signed)
[facility] - [place] at [facility]   PATIENT NAME: [name]    MR#:  [id number]  DATE OF BIRTH:  03-10-1956  DATE OF ADMISSION:  01/16/2016 ADMITTING PHYSICIAN: Oralia Manis, MD  DATE OF DISCHARGE: 01/19/2016  PRIMARY CARE PHYSICIAN: No PCP Per Patient    ADMISSION DIAGNOSIS:   Precordial chest pain [R07.2]   DISCHARGE DIAGNOSIS:  Principal Problem:   Chest painAtypical Active Problems:   Lupus (HCC)   H/O: stroke   SECONDARY DIAGNOSIS:   Past Medical History  Diagnosis Date  . Lupus (HCC)   . Stroke (HCC)   . ILD (interstitial lung disease) [facility])     HOSPITAL COURSE:  [name]  is a 60 y.o. female admitted 01/16/2016 with chief complaint Chest Pain . Please see H&P performed by Oralia Manis, MD for further information. Patient presented with the above findings Place on telemetry cardiac enzymes trended and remained within normal limits, patient evaluated by cardiology and underwent cardiac stress testing which is within normal limits. Patient fortunately chest pain free on day of discharge  DISCHARGE CONDITIONS:   Stable  CONSULTS OBTAINED:  Treatment Team:  Lamar Blinks, MD  DRUG ALLERGIES:  No Known Allergies  DISCHARGE MEDICATIONS:   Current Discharge Medication List    START taking these medications   Details  atorvastatin (LIPITOR) 40 MG tablet Take 1 tablet (40 mg total) by mouth daily at 6 PM. Qty: 30 tablet, Refills: 0    nitroGLYCERIN (NITROSTAT) 0.4 MG SL tablet Place 1 tablet (0.4 mg total) under the tongue every 5 (five) minutes as needed for chest pain. Qty: 30 tablet, Refills: 12      CONTINUE these medications which have NOT CHANGED   Details  clopidogrel (PLAVIX) 75 MG tablet Take 75 mg by mouth daily.    furosemide (LASIX) 20 MG tablet Take 20 mg by mouth.    hydroxychloroquine (PLAQUENIL) 200 MG tablet Take 200 mg by mouth 2 (two) times daily.     predniSONE (DELTASONE) 10 MG tablet Take 10 mg  by mouth daily with breakfast.         DISCHARGE INSTRUCTIONS:    DIET:  Cardiac diet  DISCHARGE CONDITION:  Good  ACTIVITY:  Activity as tolerated  OXYGEN:  Home Oxygen: No.   Oxygen Delivery: room air  DISCHARGE LOCATION:  home   If you experience worsening of your admission symptoms, develop shortness of breath, life threatening emergency, suicidal or homicidal thoughts you must seek medical attention immediately by calling 911 or calling your MD immediately  if symptoms less severe.  You Must read complete instructions/literature along with all the possible adverse reactions/side effects for all the Medicines you take and that have been prescribed to you. Take any new Medicines after you have completely understood and accpet all the possible adverse reactions/side effects.   Please note  You were cared for by a hospitalist during your hospital stay. If you have any questions about your discharge medications or the care you received while you were in the hospital after you are discharged, you can call the unit and asked to speak with the hospitalist on call if the hospitalist that took care of you is not available. Once you are discharged, your primary care physician will handle any further medical issues. Please note that NO REFILLS for any discharge medications will be authorized once you are discharged, as it is imperative that you return to your primary care physician (or establish a relationship with  a primary care physician if you do not have one) for your aftercare needs so that they can reassess your need for medications and monitor your lab values.    On the day of Discharge:   VITAL SIGNS:  Blood pressure 130/72, pulse 90, temperature 97.4 F (36.3 C), temperature source Oral, resp. rate 20, height 5\' 2"  (1.575 m), weight 194 lb (87.998 kg), SpO2 95 %.  I/O:   Intake/Output Summary (Last 24 hours) at 01/19/16 1429 Last data filed at 01/19/16 0616  Gross per  24 hour  Intake    120 ml  Output    300 ml  Net   -180 ml    PHYSICAL EXAMINATION:  GENERAL:  60 y.o.-year-old patient lying in the bed with no acute distress.  EYES: Pupils equal, round, reactive to light and accommodation. No scleral icterus. Extraocular muscles intact.  HEENT: Head atraumatic, normocephalic. Oropharynx and nasopharynx clear.  NECK:  Supple, no jugular venous distention. No thyroid enlargement, no tenderness.  LUNGS: Normal breath sounds bilaterally, no wheezing, rales,rhonchi or crepitation. No use of accessory muscles of respiration.  CARDIOVASCULAR: S1, S2 normal. No murmurs, rubs, or gallops.  ABDOMEN: Soft, non-tender, non-distended. Bowel sounds present. No organomegaly or mass.  EXTREMITIES: No pedal edema, cyanosis, or clubbing.  NEUROLOGIC: Cranial nerves II through XII are intact. Muscle strength 5/5 in all extremities. Sensation intact. Gait not checked.  PSYCHIATRIC: The patient is alert and oriented x 3.  SKIN: No obvious rash, lesion, or ulcer.   DATA REVIEW:   CBC  Recent Labs Lab 01/17/16 0500  WBC 5.9  HGB 13.5  HCT 40.3  PLT 90*    Chemistries   Recent Labs Lab 01/16/16 1912 01/17/16 0500  NA 139 140  K 4.2 3.3*  CL 106 110  CO2 27 24  GLUCOSE 86 66  BUN 14 14  CREATININE 0.83 0.84  CALCIUM 8.1* 7.8*  AST 62*  --   ALT 31  --   ALKPHOS 147*  --   BILITOT 0.7  --     Cardiac Enzymes  Recent Labs Lab 01/17/16 2250  TROPONINI <0.03    Microbiology Results  Results for orders placed or performed during the hospital encounter of 05/16/07  Culture, routine-abscess     Status: None   Collection Time: 05/16/07 11:15 AM  Result Value Ref Range Status   Specimen Description ABSCESS RIGHT LOWER LEG  Final   Special Requests I&D SPECIMEN  Final   Gram Stain   Final    RARE WBC PRESENT, PREDOMINANTLY PMN NO SQUAMOUS EPITHELIAL CELLS SEEN NO ORGANISMS SEEN   Culture NO GROWTH 3 DAYS  Final   Report Status 05/19/2007 FINAL   Final    RADIOLOGY:  No results found.   Management plans discussed with the patient, family and they are in agreement.  CODE STATUS:     Code Status Orders        Start     Ordered   01/17/16 0234  Full code   Continuous     01/17/16 0233    Code Status History    Date Active Date Inactive Code Status Order ID Comments User Context   This patient has a current code status but no historical code status.      TOTAL TIME TAKING CARE OF THIS PATIENT: 28 minutes.    Hower,  03/18/16.D on 01/19/2016 at 2:29 PM  Between 7am to 6pm - Pager - [phone]  After 6pm go to  [email or web address] - Scientist, research (life sciences) [facility]  Office  [phone]  CC: Primary care physician; No PCP Per Patient

## 2016-01-20 ENCOUNTER — Ambulatory Visit: Payer: Medicare Other | Admitting: Internal Medicine

## 2016-01-21 ENCOUNTER — Encounter: Payer: Medicare Other | Attending: Internal Medicine | Admitting: Internal Medicine

## 2016-01-21 DIAGNOSIS — J40 Bronchitis, not specified as acute or chronic: Secondary | ICD-10-CM | POA: Diagnosis not present

## 2016-01-21 DIAGNOSIS — I10 Essential (primary) hypertension: Secondary | ICD-10-CM | POA: Insufficient documentation

## 2016-01-21 DIAGNOSIS — Z79899 Other long term (current) drug therapy: Secondary | ICD-10-CM | POA: Diagnosis not present

## 2016-01-21 DIAGNOSIS — M3210 Systemic lupus erythematosus, organ or system involvement unspecified: Secondary | ICD-10-CM | POA: Diagnosis not present

## 2016-01-21 DIAGNOSIS — Z8673 Personal history of transient ischemic attack (TIA), and cerebral infarction without residual deficits: Secondary | ICD-10-CM | POA: Insufficient documentation

## 2016-01-21 DIAGNOSIS — I87322 Chronic venous hypertension (idiopathic) with inflammation of left lower extremity: Secondary | ICD-10-CM | POA: Diagnosis not present

## 2016-01-21 DIAGNOSIS — R600 Localized edema: Secondary | ICD-10-CM | POA: Insufficient documentation

## 2016-01-21 DIAGNOSIS — L97811 Non-pressure chronic ulcer of other part of right lower leg limited to breakdown of skin: Secondary | ICD-10-CM | POA: Diagnosis not present

## 2016-01-21 DIAGNOSIS — I87331 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 lower extremity: Secondary | ICD-10-CM | POA: Diagnosis not present

## 2016-01-21 DIAGNOSIS — Z87891 Personal history of nicotine dependence: Secondary | ICD-10-CM | POA: Insufficient documentation

## 2016-01-22 NOTE — Progress Notes (Signed)
JOYCE, HEITMAN ([id number]) Visit Report for 01/21/2016 Arrival Information Details Patient Name: [name], [name] Date of Service: 01/21/2016 8:45 AM Medical Record Patient Account Number: [id number] Number: Treating RN: Clover Mealy, RN, [name], Rita 06-15-1956 [phone] y.o. Other Clinician: Date of Birth/Sex: Female) Treating ROBSON, MICHAEL Primary Care Physician: PATIENT, NO Physician/Extender: G Referring Physician: Everette Rank in Treatment: 4 Visit Information History Since Last Visit Added or deleted any medications: No Patient Arrived: Cane Any new allergies or adverse reactions: No Arrival Time: 08:42 Had a fall or experienced change in No Accompanied By: sis activities of daily living that may affect Transfer Assistance: None risk of falls: Patient Identification Verified: Yes Signs or symptoms of abuse/neglect since last No Secondary Verification Process Completed: Yes visito Patient Requires Transmission-Based No Hospitalized since last visit: No Precautions: Has Dressing in Place as Prescribed: Yes Patient Has Alerts: No Has Compression in Place as Prescribed: No Pain Present Now: No Electronic Signature(s) Signed: 01/21/2016 4:14:26 PM By: Elpidio Eric [name], RN Entered By: Elpidio Eric on 01/21/2016 08:42:29 Corado, [name] ([id number]) -------------------------------------------------------------------------------- Encounter Discharge Information Details Patient Name: [name], [name] Date of Service: 01/21/2016 8:45 AM Medical Record Patient Account Number: [id number] Number: Treating RN: Clover Mealy, RN, [name], Rita [birth year] [phone] y.o. Other Clinician: Date of Birth/Sex: Female) Treating ROBSON, MICHAEL Primary Care Physician: PATIENT, NO Physician/Extender: G Referring Physician: Everette Rank in Treatment: 4 Encounter Discharge Information Items Discharge Pain Level: 0 Discharge Condition: Stable Ambulatory Status:  Ambulatory Discharge Destination: Home Transportation: Private Auto Accompanied By: sister Schedule Follow-up Appointment: No Medication Reconciliation completed and provided to Patient/Care No Sheresa Cullop: Provided on Clinical Summary of Care: 01/21/2016 Form Type Recipient Paper Patient GC Electronic Signature(s) Signed: 01/21/2016 4:14:26 PM By: Elpidio Eric [name], RN Previous Signature: 01/21/2016 9:27:29 AM Version By: Gwenlyn Perking Entered By: Elpidio Eric on 01/21/2016 09:28:33 [name], [name] Elbert [name] ([id number]) -------------------------------------------------------------------------------- Lower Extremity Assessment Details Patient Name: [name], [name] Date of Service: 01/21/2016 8:45 AM Medical Record Patient Account Number: [id number] Number: Treating RN: Clover Mealy, RN, [name], Rita [birth year] [phone] y.o. Other Clinician: Date of Birth/Sex: Female) Treating ROBSON, MICHAEL Primary Care Physician: PATIENT, NO Physician/Extender: G Referring Physician: Everette Rank in Treatment: 4 Edema Assessment Assessed: [Left: No] [Right: No] E[Left: dema] [Right: :] Calf Left: Right: Point of Measurement: cm From Medial Instep cm 48.5 cm Ankle Left: Right: Point of Measurement: cm From Medial Instep cm 28 cm Vascular Assessment Claudication: Claudication Assessment [Right:None] Pulses: Posterior Tibial Dorsalis Pedis Palpable: [Right:Yes] Extremity colors, hair growth, and conditions: Extremity Color: [Right:Dusky] Hair Growth on Extremity: [Right:No] Temperature of Extremity: [Right:Warm] Capillary Refill: [Right:< 3 seconds] Toe Nail Assessment Left: Right: Thick: Yes Discolored: Yes Deformed: No Improper Length and Hygiene: No Electronic Signature(s) Signed: 01/21/2016 4:14:26 PM By: Elpidio Eric [name], RN Linzy, [name] ([id number]) Entered By: Elpidio Eric on 01/21/2016 09:06:46 [name], [name]  ([id number]) -------------------------------------------------------------------------------- Multi Wound Chart Details Patient Name: [name], [name] Date of Service: 01/21/2016 8:45 AM Medical Record Patient Account Number: [id number] Number: Treating RN: Clover Mealy, RN, [name], Rita 1956/08/13 [phone] y.o. Other Clinician: Date of Birth/Sex: Female) Treating ROBSON, MICHAEL Primary Care Physician: PATIENT, NO Physician/Extender: G Referring Physician: Everette Rank in Treatment: 4 Vital Signs Height(in): 62 Pulse(bpm): 99 Weight(lbs): 170 Blood Pressure 120/64 (mmHg): Body Mass Index(BMI): 31 Temperature(F): 98.2 Respiratory Rate 18 (breaths/min): Photos: [1:No Photos] [N/A:N/A] Wound Location: [1:Right Lower Leg - Medial] [N/A:N/A] Wounding Event: [1:Blister] [N/A:N/A] Primary Etiology: [1:Venous Leg Ulcer] [N/A:N/A] Date Acquired: [1:12/15/2015] [N/A:N/A]  Weeks of Treatment: [1:4] [N/A:N/A] Wound Status: [1:Open] [N/A:N/A] Measurements L x W x D 6.8x4.8x0.1 [N/A:N/A] (cm) Area (cm) : [1:25.635] [N/A:N/A] Volume (cm) : [1:2.564] [N/A:N/A] % Reduction in Area: [1:1.10%] [N/A:N/A] % Reduction in Volume: 1.10% [N/A:N/A] Classification: [1:Partial Thickness] [N/A:N/A] Exudate Amount: [1:Large] [N/A:N/A] Exudate Type: [1:Serosanguineous] [N/A:N/A] Exudate Color: [1:red, brown] [N/A:N/A] Wound Margin: [1:Flat and Intact] [N/A:N/A] Granulation Amount: [1:Large (67-100%)] [N/A:N/A] Granulation Quality: [1:Red, Friable] [N/A:N/A] Necrotic Amount: [1:Small (1-33%)] [N/A:N/A] Exposed Structures: [1:Fascia: No Fat: No Tendon: No Muscle: No Joint: No Bone: No] [N/A:N/A] Limited to Skin Breakdown Epithelialization: None N/A N/A Periwound Skin Texture: Edema: No N/A N/A Excoriation: No Induration: No Callus: No Crepitus: No Fluctuance: No Friable: No Rash: No Scarring: No Periwound Skin Moist: Yes N/A N/A Moisture: Maceration: No Dry/Scaly: No Periwound  Skin Color: Hemosiderin Staining: Yes N/A N/A Atrophie Blanche: No Cyanosis: No Ecchymosis: No Erythema: No Mottled: No Pallor: No Rubor: No Temperature: No Abnormality N/A N/A Tenderness on Yes N/A N/A Palpation: Wound Preparation: Ulcer Cleansing: Other: N/A N/A soap and water Topical Anesthetic Applied: Other: lidocaine 4% Treatment Notes Electronic Signature(s) Signed: 01/21/2016 4:14:26 PM By: Elpidio Eric [name], RN Entered By: Elpidio Eric on 01/21/2016 09:25:55 Takagi, [name] ([id number]) -------------------------------------------------------------------------------- Multi-Disciplinary Care Plan Details Patient Name: Meskill, Lance L. Date of Service: 01/21/2016 8:45 AM Medical Record Patient Account Number: [id number] Number: Treating RN: Clover Mealy, RN, [name], Rita Mar 14, 1956 [phone] y.o. Other Clinician: Date of Birth/Sex: Female) Treating ROBSON, MICHAEL Primary Care Physician: PATIENT, NO Physician/Extender: G Referring Physician: Everette Rank in Treatment: 4 Active Inactive Abuse / Safety / Falls / Self Care Management Nursing Diagnoses: Impaired physical mobility Potential for falls Goals: Patient will remain injury free Date Initiated: 12/23/2015 Goal Status: Active Interventions: Assess fall risk on admission and as needed Notes: Nutrition Nursing Diagnoses: Imbalanced nutrition Goals: Patient/caregiver verbalizes understanding of need to maintain therapeutic glucose control per primary care physician Date Initiated: 12/23/2015 Goal Status: Active Interventions: Provide education on nutrition Notes: Orientation to the Wound Care Program Nursing Diagnoses: Knowledge deficit related to the wound healing center program ROSHAUN, STURKEY ([id number]) Goals: Patient/caregiver will verbalize understanding of the Wound Healing Center Program Date Initiated: 12/23/2015 Goal Status: Active Interventions: Provide education on orientation to  the wound center Notes: Venous Leg Ulcer Nursing Diagnoses: Potential for venous Insuffiency (use before diagnosis confirmed) Goals: Patient will maintain optimal edema control Date Initiated: 12/23/2015 Goal Status: Active Interventions: Provide education on venous insufficiency Notes: Wound/Skin Impairment Nursing Diagnoses: Impaired tissue integrity Goals: Ulcer/skin breakdown will heal within 14 weeks Date Initiated: 12/23/2015 Goal Status: Active Interventions: Assess ulceration(s) every visit Notes: Electronic Signature(s) Signed: 01/21/2016 4:14:26 PM By: Elpidio Eric [name], RN Entered By: Elpidio Eric on 01/21/2016 09:25:49 [name], [name] ([id number]) -------------------------------------------------------------------------------- Pain Assessment Details Patient Name: Hallmark, [name] Date of Service: 01/21/2016 8:45 AM Medical Record Patient Account Number: [id number] Number: Treating RN: Clover Mealy, RN, [name], Rita [birth year] [phone] y.o. Other Clinician: Date of Birth/Sex: Female) Treating ROBSON, MICHAEL Primary Care Physician: PATIENT, NO Physician/Extender: G Referring Physician: Everette Rank in Treatment: 4 Active Problems Location of Pain Severity and Description of Pain Patient Has Paino No Site Locations With Dressing Change: No Pain Management and Medication Current Pain Management: Electronic Signature(s) Signed: 01/21/2016 4:14:26 PM By: Elpidio Eric [name], RN Entered By: Elpidio Eric on 01/21/2016 08:46:29 [name], [name] ([id number]) -------------------------------------------------------------------------------- Patient/Caregiver Education Details Patient Name: [name], [name] Date of Service: 01/21/2016 8:45 AM Medical Record Patient Account Number: [id number] Number: Treating  RN: Clover Mealy, RN, [name], Rita 08-02-1956 (59 y.o. Other Clinician: Date of Birth/Gender: Female) Treating ROBSON, MICHAEL Primary Care Physician:  PATIENT, NO Physician/Extender: G Referring Physician: Everette Rank in Treatment: 4 Education Assessment Education Provided To: Patient and Caregiver Education Topics Provided Nutrition: Methods: Explain/Verbal Responses: State content correctly Venous: Methods: Explain/Verbal Responses: State content correctly Welcome To The [facility]: Methods: Explain/Verbal Responses: State content correctly Electronic Signature(s) Signed: 01/21/2016 4:14:26 PM By: Elpidio Eric [name], RN Entered By: Elpidio Eric on 01/21/2016 09:28:50 [name], [name] ([id number]) -------------------------------------------------------------------------------- Wound Assessment Details Patient Name: [name], Zaneta L. Date of Service: 01/21/2016 8:45 AM Medical Record Patient Account Number: [id number] Number: Treating RN: Clover Mealy, RN, [name], Rita 09/15/1955 [phone] y.o. Other Clinician: Date of Birth/Sex: Female) Treating ROBSON, MICHAEL Primary Care Physician: PATIENT, NO Physician/Extender: G Referring Physician: Everette Rank in Treatment: 4 Wound Status Wound Number: 1 Primary Etiology: Venous Leg Ulcer Wound Location: Right Lower Leg - Medial Wound Status: Open Wounding Event: Blister Date Acquired: 12/15/2015 Weeks Of Treatment: 4 Clustered Wound: No Photos Photo Uploaded By: Elpidio Eric on 01/21/2016 12:47:49 Wound Measurements Length: (cm) 6.8 Width: (cm) 4.8 Depth: (cm) 0.1 Area: (cm) 25.635 Volume: (cm) 2.564 % Reduction in Area: 1.1% % Reduction in Volume: 1.1% Epithelialization: None Tunneling: No Undermining: No Wound Description Classification: Partial Thickness Wound Margin: Flat and Intact Exudate Amount: Large Fanelli, Nixon L. ([id number]) Foul Odor After Cleansing: No Exudate Type: Serosanguineous Exudate Color: red, brown Wound Bed Granulation Amount: Large (67-100%) Exposed Structure Granulation Quality: Red, Friable Fascia Exposed:  No Necrotic Amount: Small (1-33%) Fat Layer Exposed: No Necrotic Quality: Adherent Slough Tendon Exposed: No Muscle Exposed: No Joint Exposed: No Bone Exposed: No Limited to Skin Breakdown Periwound Skin Texture Texture Color No Abnormalities Noted: No No Abnormalities Noted: No Callus: No Atrophie Blanche: No Crepitus: No Cyanosis: No Excoriation: No Ecchymosis: No Fluctuance: No Erythema: No Friable: No Hemosiderin Staining: Yes Induration: No Mottled: No Localized Edema: No Pallor: No Rash: No Rubor: No Scarring: No Temperature / Pain Moisture Temperature: No Abnormality No Abnormalities Noted: No Tenderness on Palpation: Yes Dry / Scaly: No Maceration: No Moist: Yes Wound Preparation Ulcer Cleansing: Other: soap and water, Topical Anesthetic Applied: Other: lidocaine 4%, Treatment Notes Wound #1 (Right, Medial Lower Leg) 1. Cleansed with: Cleanse wound with antibacterial soap and water 3. Peri-wound Care: Moisturizing lotion 4. Dressing Applied: Aquacel Ag 5. Secondary Dressing Applied ABD Pad 7. Secured with CALYSE, MURCIA ([id number]) 4 Layer Compression System - Bilateral Notes drawtex Electronic Signature(s) Signed: 01/21/2016 4:14:26 PM By: Elpidio Eric [name], RN Entered By: Elpidio Eric on 01/21/2016 08:51:50 Torrisi, [name] ([id number]) -------------------------------------------------------------------------------- Vitals Details Patient Name: Hilburn, Pernell L. Date of Service: 01/21/2016 8:45 AM Medical Record Patient Account Number: [id number] Number: Treating RN: Clover Mealy, RN, [name], Rita 1956/02/22 [phone] y.o. Other Clinician: Date of Birth/Sex: Female) Treating ROBSON, MICHAEL Primary Care Physician: PATIENT, NO Physician/Extender: G Referring Physician: Everette Rank in Treatment: 4 Vital Signs Time Taken: 08:46 Temperature (F): 98.2 Height (in): 62 Pulse (bpm): 99 Weight (lbs): 170 Respiratory Rate  (breaths/min): 18 Body Mass Index (BMI): 31.1 Blood Pressure (mmHg): 120/64 Reference Range: 80 - 120 mg / dl Electronic Signature(s) Signed: 01/21/2016 4:14:26 PM By: Elpidio Eric [name], RN Entered By: Elpidio Eric on 01/21/2016 08:46:50

## 2016-01-22 NOTE — Progress Notes (Signed)
[name] ([id number]) Visit Report for 01/21/2016 Chief Complaint Document Details Pitney, Xitlally Date of Service: 01/21/2016 8:45 AM Patient Name: [name] Account Number: [id number] Medical Record Treating RN: Clover Mealy RN, [name], [place] Sink [id number] Number: Other Clinician: 1956-08-03 (59 y.o. Treating ROBSON, MICHAEL Date of Birth/Sex: Female) Physician/Extender: G Primary Care PATIENT, NO Physician: Referring Physician: Everette Rank in Treatment: 4 Information Obtained from: Patient Chief Complaint Patient is here for review of a fairly substantial wound on her right medial lower leg that is been present for the last week Electronic Signature(s) Signed: 01/21/2016 5:53:31 PM By: [name] Entered By: Baltazar Najjar on 01/21/2016 09:12:46 [name] ([id number]) -------------------------------------------------------------------------------- Debridement Details Walraven, Idaly Date of Service: 01/21/2016 8:45 AM Patient Name: [name] Account Number: [id number] Medical Record Treating RN: Clover Mealy RN, [name], [place] Sink [id number] Number: Other Clinician: April 09, 1956 (59 y.o. Treating ROBSON, MICHAEL Date of Birth/Sex: Female) Physician/Extender: G Primary Care PATIENT, NO Physician: Referring Physician: Everette Rank in Treatment: 4 Debridement Performed for Wound #1 Right,Medial Lower Leg Assessment: Performed By: Physician Maxwell Caul, MD Debridement: Debridement Pre-procedure Yes Verification/Time Out Taken: Start Time: 09:15 Pain Control: Lidocaine 4% Topical Solution Level: Skin/Subcutaneous Tissue Total Area Debrided (L x 6.8 (cm) x 4.8 (cm) = 32.64 (cm) W): Tissue and other Non-Viable, Fibrin/Slough, Subcutaneous material debrided: Instrument: Curette Bleeding: Minimum Hemostasis Achieved: Pressure End Time: 09:20 Procedural Pain: 0 Post Procedural Pain: 0 Response to Treatment: Procedure was tolerated well Post  Debridement Measurements of Total Wound Length: (cm) 6.8 Width: (cm) 4.8 Depth: (cm) 0.1 Volume: (cm) 2.564 Post Procedure Diagnosis Same as Pre-procedure Electronic Signature(s) Signed: 01/21/2016 4:14:26 PM By: Elpidio Eric [name], RN Signed: 01/21/2016 5:53:31 PM By: [name] Entered By: Baltazar Najjar on 01/21/2016 09:56:27 [name] ([id number]) [name] ([id number]) -------------------------------------------------------------------------------- HPI Details [name] Date of Service: 01/21/2016 8:45 AM Patient Name: [name] Account Number: [id number] Medical Record Treating RN: Clover Mealy RN, [name], [place] Sink [id number] Number: Other Clinician: [birth year] (59 y.o. Treating ROBSON, MICHAEL Date of Birth/Sex: Female) Physician/Extender: G Primary Care PATIENT, NO Physician: Referring Physician: Everette Rank in Treatment: 4 History of Present Illness HPI Description: 12/23/15; this is a 60 year old lady who recently relocated back to [place] from [place]. She is staying with a family friend previously lived with a daughter in [place]. 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 She is on chronic prednisone she is not a diabetic. Dose of prednisone is 10 mg. The patient tells me that roughly a week ago she developed a fairly substantial blister over this area that then ruptured. She was seen in the emergency room on 5/7 an x-ray of the leg was negative she was put on Septra although I don't think any cultures were done. She also tells me she has had chronic edema in her legs for a number of years. She had a similar presentation to currently in the right lateral lower leg roughly a year ago that she was able to heal on her own. As noted she has systemic lupus but does not have lupus nephritis according to the patient. She has a lot of edema in her lower legs. The ABI's could not be  obtained. 12/31/15; the patient's insurance which is [place] [employer] makes her out of network for any home health therefore we have been changing her dressing in our facility. I reviewed her trip to the ER on 12/21/2015 her creatinine was  within the normal range hemoglobin and white count normal. Culture of the wound was negative. X-ray of the leg showed soft tissue edema no foreign bodies. We have been dressing this with Aquacel Ag due to the amount of ongoing drainage 01/07/16; the patient had her arterial studies this morning I don't have these results area she comes back in to have [place] rewrap since she doesn't have access to home health 01/14/16 I still do not have the results of her arterial studies. Our nurse correctly pointed out that we've been wrapping the left leg without an open wound I think this had to do with the fact that she had so much edema when she came in I was concerned about leaving this unwrapped. 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 We left her left leg unwrapped last week as she has no open wound here today she has extensive edema here. We did order stockings however I believe the company called but they have not called them back. She comes back in today with extensive edema in the left leg. We have her arterial studies which showed triphasic waves and all locations tested including her posterior tibial and anterior tibial arteries bilaterally. Report listed no hemodynamically significant bilateral lower extremity arterial stenosis. She has her venous studies later this month. I'm going to rewrap the left leg due to the extent of the edema, transitioning her into the stocking we ordered last week as soon as one is available LEVINA, BOYACK ([id number]) Electronic Signature(s) Signed: 01/21/2016 5:53:31 PM By: [name] Entered By:  Baltazar Najjar on 01/21/2016 09:22:34 [name] ([id number]) -------------------------------------------------------------------------------- Physical Exam Details [name] Date of Service: 01/21/2016 8:45 AM Patient Name: [name] Account Number: [id number] Medical Record Treating RN: Clover Mealy RN, [name], [place] Sink [id number] Number: Other Clinician: Oct 09, 1955 (59 y.o. Treating ROBSON, MICHAEL Date of Birth/Sex: Female) Physician/Extender: G Primary Care PATIENT, NO Physician: Referring Physician: Phineas Semen Weeks in Treatment: 4 Notes Wound exam; this substantial wound on the right medial leg has rims of advancing epithelialization. Post debridement of the wound bed looks very healthy and I am optimistic about progression towards closure here. She does not have any evidence of infection. As we did not wrap the left leg last week there is extensive edema here but no open wound. Electronic Signature(s) Signed: 01/21/2016 5:53:31 PM By: [name] Entered By: Baltazar Najjar on 01/21/2016 09:18:16 Revere, Durward [name] ([id number]) -------------------------------------------------------------------------------- Physician Orders Details [name] Date of Service: 01/21/2016 8:45 AM Patient Name: [name] Account Number: [id number] Medical Record Treating RN: Clover Mealy RN, [name],  Sink [id number] Number: Other Clinician: 1956-03-23 (59 y.o. 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 I87.322 Chronic venous hypertension (idiopathic) with inflammation of left lower extremity M32.10 Systemic lupus  erythematosus, organ or system involvement unspecified Wound Cleansing Wound #1 Right,Medial Lower Leg o Cleanse wound with mild soap and water Anesthetic Wound #1 Right,Medial Lower Leg o Topical Lidocaine 4% cream applied to wound bed prior to debridement Skin Barriers/Peri-Wound Care Wound #1 Right,Medial Lower Leg o Barrier cream Primary Wound Dressing Wound #1 Right,Medial Lower  Leg o Aquacel Ag - or equivalent Secondary Dressing Wound #1 Right,Medial Lower Leg o ABD pad o Drawtex - or equivalent Dressing Change Frequency Wound #1 Right,Medial Lower Leg o Change dressing every week BETHENE, HANKINSON. ([id number]) Follow-up Appointments Wound #1 Right,Medial Lower Leg o Return Appointment in 1 week. o Nurse Visit as needed Edema Control Wound #1 Right,Medial Lower Leg o 4 Layer Compression System - Bilateral o Elevate legs to the level of the heart and pump ankles as often as possible Additional Orders / Instructions Wound #1 Right,Medial Lower Leg o Activity as tolerated Electronic Signature(s) Signed: 01/21/2016 4:14:26 PM By: Elpidio Eric [name], RN Signed: 01/21/2016 5:53:31 PM By: [name] Entered By: Elpidio Eric on 01/21/2016 09:26:40 Mcmahen, Durward [name] ([id number]) -------------------------------------------------------------------------------- Problem List Details [name] Date of Service: 01/21/2016 8:45 AM Patient Name: [name] Account Number: [id number] Medical Record Treating RN: Clover Mealy RN, [name], [place] Sink [id number] Number: Other Clinician: 07-03-1956 (59 y.o. Treating ROBSON, MICHAEL Date of Birth/Sex: Female) Physician/Extender: G Primary Care PATIENT, NO Physician: Referring Physician: Everette Rank in Treatment: 4 Active Problems ICD-10 Encounter Code Description Active Date Diagnosis I87.331 Chronic venous hypertension (idiopathic) with ulcer and 12/23/2015 Yes inflammation of right lower  extremity I87.322 Chronic venous hypertension (idiopathic) with 12/23/2015 Yes inflammation of left lower extremity M32.10 Systemic lupus erythematosus, organ or system 12/23/2015 Yes involvement unspecified Inactive Problems Resolved Problems Electronic Signature(s) Signed: 01/21/2016 5:53:31 PM By: [name] Entered By: Baltazar Najjar on 01/21/2016 09:12:21 Enriquez, Durward [name] ([id number]) -------------------------------------------------------------------------------- Progress Note Details [name] Date of Service: 01/21/2016 8:45 AM Patient Name: [name] Account Number: [id number] Medical Record Treating RN: Clover Mealy RN, [name], [place] Sink [id number] Number: Other Clinician: [birth year] (59 y.o. Treating ROBSON, MICHAEL Date of Birth/Sex: Female) Physician/Extender: G Primary Care PATIENT, NO Physician: Referring Physician: Everette Rank in Treatment: 4 Subjective Chief Complaint Information obtained from Patient Patient is here for review of a fairly substantial wound on her right medial lower leg that is been present for the last week History of Present Illness (HPI) 12/23/15; this is a 60 year old lady who recently relocated back to [place] from [place]. She is staying with a family friend previously lived with a daughter in [place]. 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 She is on chronic prednisone she is not a diabetic. Dose of prednisone is 10 mg. The patient tells me that roughly a week ago she developed a fairly substantial blister over this area that then ruptured. She was seen in the emergency room on 5/7 an x-ray of the leg was negative she was put on Septra although I don't think any cultures were done. She also tells me she has had chronic edema in her legs for a number of years. She had a similar presentation to currently in the right lateral lower leg roughly a year ago that she  was able to heal on her own. As noted she has systemic lupus but does not have lupus nephritis according to the patient. She has a lot of edema in her lower legs. The ABI's could not be obtained. 12/31/15; the patient's insurance which is [place] [employer] makes her out of network for any home health therefore we have been changing her dressing in our facility. I reviewed her trip to the ER on 12/21/2015 her creatinine was within the normal range hemoglobin and white count normal. Culture of the wound was negative. X-ray of the leg  showed soft tissue edema no foreign bodies. We have been dressing this with Aquacel Ag due to the amount of ongoing drainage 01/07/16; the patient had her arterial studies this morning I don't have these results area she comes back in to have [place] rewrap since she doesn't have access to home health 01/14/16 I still do not have the results of her arterial studies. Our nurse correctly pointed out that we've been wrapping the left leg without an open wound I think this had to do with the fact that she had so much edema when she came in I was concerned about leaving this unwrapped. 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 We left her left leg unwrapped last week as she has no open wound here today she has extensive edema here. We did order stockings however I believe the company called but they have not called him Petko, Elsie L. ([id number]) back. She comes back in today with extensive edema in the left leg Objective Constitutional Vitals Time Taken: 8:46 AM, Height: 62 in, Weight: 170 lbs, BMI: 31.1, Temperature: 98.2 F, Pulse: 99 bpm, Respiratory Rate: 18 breaths/min, Blood Pressure: 120/64 mmHg. Integumentary (Hair, Skin) Wound #1 status is Open. Original cause of wound was Blister. The wound is located on the Right,Medial Lower Leg. The wound  measures 6.8cm length x 4.8cm width x 0.1cm depth; 25.635cm^2 area and 2.564cm^3 volume. The wound is limited to skin breakdown. There is no tunneling or undermining noted. There is a large amount of serosanguineous drainage noted. The wound margin is flat and intact. There is large (67-100%) red, friable granulation within the wound bed. There is a small (1-33%) amount of necrotic tissue within the wound bed including Adherent Slough. The periwound skin appearance exhibited: Moist, Hemosiderin Staining. The periwound skin appearance did not exhibit: Callus, Crepitus, Excoriation, Fluctuance, Friable, Induration, Localized Edema, Rash, Scarring, Dry/Scaly, Maceration, Atrophie Blanche, Cyanosis, Ecchymosis, Mottled, Pallor, Rubor, Erythema. Periwound temperature was noted as No Abnormality. The periwound has tenderness on palpation. Assessment Active Problems ICD-10 I87.331 - 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 lower extremity I87.322 - Chronic venous hypertension (idiopathic) with inflammation of left lower extremity M32.10 - Systemic lupus erythematosus, organ or system involvement unspecified Plan continue with the same dressing silver alginate,profore x2 we re-wrapped the left leg, familly will by stocking for the left elg Falk, Mykael L. ([id number]) arterial exam obtained showed triphasic waves bilaterally Electronic Signature(s) Signed: 01/21/2016 5:53:31 PM By: [name] Entered By: Baltazar Najjar on 01/21/2016 09:25:41 Pennino, Durward [name] ([id number]) -------------------------------------------------------------------------------- SuperBill Details Pacha, Raileigh Date of Service: 01/21/2016 Patient Name: [name] Account Number: [id number] Medical Record Treating RN: Clover Mealy RN, [name], [place] Sink [id number] Number: Other Clinician: 01-Feb-1956 (59 y.o. 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 I87.331 extremity I87.322 Chronic venous hypertension (idiopathic) with inflammation of left lower extremity M32.10 Systemic lupus erythematosus, organ or system involvement unspecified Facility Procedures CPT4: Description Modifier Quantity Code 62952841 11042 - DEB SUBQ TISSUE 20 SQ CM/< 1 ICD-10 Description Diagnosis I87.331 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 lower extremity CPT4: 32440102 11045 - DEB SUBQ TISS EA ADDL 20CM 1 ICD-10 Description Diagnosis I87.331 Chronic  venous hypertension (idiopathic) with ulcer and inflammation of right lower extremity Physician Procedures CPT4: Description Modifier Quantity Code 6433295 11042 - WC PHYS SUBQ TISS 20 SQ CM 1 ICD-10 Description Diagnosis I87.331 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 lower extremity CPT4: 1884166 11045 - WC PHYS SUBQ TISS EA ADDL 20 CM 1 AZIYAH, PROVENCAL ([id number]) Electronic Signature(s) Signed: 01/21/2016 5:53:31 PM By: [name] Entered By: Baltazar Najjar on 01/21/2016 09:57:05

## 2016-01-28 ENCOUNTER — Encounter: Payer: Medicare Other | Admitting: Internal Medicine

## 2016-01-28 DIAGNOSIS — I87331 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 lower extremity: Secondary | ICD-10-CM | POA: Diagnosis not present

## 2016-02-04 ENCOUNTER — Encounter: Payer: Medicare Other | Admitting: Internal Medicine

## 2016-02-04 DIAGNOSIS — I87331 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 lower extremity: Secondary | ICD-10-CM | POA: Diagnosis not present

## 2016-02-05 NOTE — Progress Notes (Addendum)
[name], [name] ([id number]) Visit Report for 02/04/2016 Arrival Information Details Patient Name: [name], [name] Date of Service: 02/04/2016 3:15 PM Medical Record Patient Account Number: [id number] Number: Treating RN: [name] May 11, 1956 (60 y.o. Other Clinician: Date of Birth/Sex: Female) Treating ROBSON, MICHAEL Primary Care Physician: PATIENT, NO Physician/Extender: G Referring Physician: Everette Rank in Treatment: 6 Visit Information History Since Last Visit Added or deleted any medications: No Patient Arrived: Ambulatory Any new allergies or adverse reactions: No Arrival Time: 15:26 Had a fall or experienced change in No Accompanied By: caregiver activities of daily living that may affect Transfer Assistance: None risk of falls: Patient Identification Verified: Yes Signs or symptoms of abuse/neglect since last No Secondary Verification Process Yes visito Completed: Hospitalized since last visit: No Patient Requires Transmission-Based No Has Compression in Place as Prescribed: Yes Precautions: Pain Present Now: No Patient Has Alerts: No Electronic Signature(s) Signed: 02/04/2016 5:46:18 PM By: Elliot Gurney, RN, [name], [name], [name] Entered By: Elliot Gurney, RN, [name], Kim on 02/04/2016 15:26:35 Maciolek, [name] ([id number]) -------------------------------------------------------------------------------- Encounter Discharge Information Details Patient Name: [name], [name] Date of Service: 02/04/2016 3:15 PM Medical Record Patient Account Number: [id number] Number: Treating RN: [name] 02-Dec-1955 (60 y.o. Other Clinician: Date of Birth/Sex: Female) Treating ROBSON, MICHAEL Primary Care Physician: PATIENT, NO Physician/Extender: G Referring Physician: Everette Rank in Treatment: 6 Encounter Discharge Information Items Discharge Pain Level: 0 Discharge Condition: Stable Ambulatory Status: Ambulatory Discharge Destination:  Home Transportation: Private Auto Accompanied By: caregiver Schedule Follow-up Appointment: Yes Medication Reconciliation completed and provided to Patient/Care Yes Derral Colucci: Provided on Clinical Summary of Care: 02/04/2016 Form Type Recipient Paper Patient GC Electronic Signature(s) Signed: 02/04/2016 4:04:00 PM By: Gwenlyn Perking Entered By: Gwenlyn Perking on 02/04/2016 16:03:59 Zielinski, [name] ([id number]) -------------------------------------------------------------------------------- Lower Extremity Assessment Details Patient Name: [name], [name] Date of Service: 02/04/2016 3:15 PM Medical Record Patient Account Number: [id number] Number: Treating RN: [name] [birth year] (60 y.o. Other Clinician: Date of Birth/Sex: Female) Treating ROBSON, MICHAEL Primary Care Physician: PATIENT, NO Physician/Extender: G Referring Physician: Everette Rank in Treatment: 6 Edema Assessment Assessed: [Left: No] [Right: No] E[Left: dema] [Right: :] Calf Left: Right: Point of Measurement: 31 cm From Medial Instep cm 46.5 cm Ankle Left: Right: Point of Measurement: 11 cm From Medial Instep cm 26.5 cm Vascular Assessment Pulses: Posterior Tibial Dorsalis Pedis Palpable: [Right:Yes] Extremity colors, hair growth, and conditions: Extremity Color: [Right:Hyperpigmented] Hair Growth on Extremity: [Right:Yes] Temperature of Extremity: [Right:Warm] Capillary Refill: [Right:< 3 seconds] Toe Nail Assessment Left: Right: Thick: No Discolored: No Deformed: No Improper Length and Hygiene: No Electronic Signature(s) Signed: 02/04/2016 5:46:18 PM By: Elliot Gurney, RN, [name], [name], [name] Entered By: Elliot Gurney, RN, [name], Kim on 02/04/2016 15:38:49 [name], [name] ([id number]) Sulser, Nirali Elbert Ewings ([id number]) -------------------------------------------------------------------------------- Multi Wound Chart Details Patient Name: [name], [name] Date of Service: 02/04/2016 3:15  PM Medical Record Patient Account Number: [id number] Number: Treating RN: [name] [birth year] (60 y.o. Other Clinician: Date of Birth/Sex: Female) Treating ROBSON, MICHAEL Primary Care Physician: PATIENT, NO Physician/Extender: G Referring Physician: Everette Rank in Treatment: 6 Vital Signs Height(in): 62 Pulse(bpm): 87 Weight(lbs): 170 Blood Pressure 175/109 (mmHg): Body Mass Index(BMI): 31 Temperature(F): 98.3 Respiratory Rate 18 (breaths/min): Photos: [1:No Photos] [N/A:N/A] Wound Location: [1:Right Lower Leg - Medial] [N/A:N/A] Wounding Event: [1:Blister] [N/A:N/A] Primary Etiology: [1:Venous Leg Ulcer] [N/A:N/A] Date Acquired: [1:12/15/2015] [N/A:N/A] Weeks of Treatment: [1:6] [N/A:N/A] Wound Status: [1:Open] [N/A:N/A] Measurements L x W x D 4x3.5x0.1 [N/A:N/A] (cm) Area (cm) : [  1:10.996] [N/A:N/A] Volume (cm) : [1:1.1] [N/A:N/A] % Reduction in Area: [1:57.60%] [N/A:N/A] % Reduction in Volume: 57.60% [N/A:N/A] Classification: [1:Partial Thickness] [N/A:N/A] Exudate Amount: [1:Large] [N/A:N/A] Exudate Type: [1:Serosanguineous] [N/A:N/A] Exudate Color: [1:red, brown] [N/A:N/A] Wound Margin: [1:Flat and Intact] [N/A:N/A] Granulation Amount: [1:Large (67-100%)] [N/A:N/A] Granulation Quality: [1:Red, Hyper-granulation, Friable] [N/A:N/A] Necrotic Amount: [1:Small (1-33%)] [N/A:N/A] Exposed Structures: [1:Fascia: No Fat: No Tendon: No Muscle: No Joint: No Bone: No] [N/A:N/A] Limited to Skin Breakdown Epithelialization: None N/A N/A Periwound Skin Texture: Edema: No N/A N/A Excoriation: No Induration: No Callus: No Crepitus: No Fluctuance: No Friable: No Rash: No Scarring: No Periwound Skin Moist: Yes N/A N/A Moisture: Maceration: No Dry/Scaly: No Periwound Skin Color: Hemosiderin Staining: Yes N/A N/A Atrophie Blanche: No Cyanosis: No Ecchymosis: No Erythema: No Mottled: No Pallor: No Rubor: No Temperature: No Abnormality N/A  N/A Tenderness on Yes N/A N/A Palpation: Wound Preparation: Ulcer Cleansing: Other: N/A N/A soap and water Topical Anesthetic Applied: Other: lidocaine 4% Treatment Notes Electronic Signature(s) Signed: 02/04/2016 5:46:18 PM By: Elliot Gurney, RN, [name], [name], [name] Entered By: Elliot Gurney, RN, [name], Kim on 02/04/2016 15:48:40 Marxen, [name] ([id number]) -------------------------------------------------------------------------------- Multi-Disciplinary Care Plan Details Patient Name: Sivak, Rhylan L. Date of Service: 02/04/2016 3:15 PM Medical Record Patient Account Number: [id number] Number: Treating RN: [name] [birth year] (59 y.o. Other Clinician: Date of Birth/Sex: Female) Treating ROBSON, MICHAEL Primary Care Physician: PATIENT, NO Physician/Extender: G Referring Physician: Everette Rank in Treatment: 6 Active Inactive Abuse / Safety / Falls / Self Care Management Nursing Diagnoses: Impaired physical mobility Potential for falls Goals: Patient will remain injury free Date Initiated: 12/23/2015 Goal Status: Active Interventions: Assess fall risk on admission and as needed Notes: Nutrition Nursing Diagnoses: Imbalanced nutrition Goals: Patient/caregiver verbalizes understanding of need to maintain therapeutic glucose control per primary care physician Date Initiated: 12/23/2015 Goal Status: Active Interventions: Provide education on nutrition Treatment Activities: Education provided on Nutrition : 01/21/2016 Notes: Orientation to the Wound Care Program CARMENCITA, CUSIC ([id number]) Nursing Diagnoses: Knowledge deficit related to the wound healing center program Goals: Patient/caregiver will verbalize understanding of the Wound Healing Center Program Date Initiated: 12/23/2015 Goal Status: Active Interventions: Provide education on orientation to the wound center Notes: Venous Leg Ulcer Nursing Diagnoses: Potential for venous Insuffiency (use  before diagnosis confirmed) Goals: Patient will maintain optimal edema control Date Initiated: 12/23/2015 Goal Status: Active Interventions: Provide education on venous insufficiency Notes: Wound/Skin Impairment Nursing Diagnoses: Impaired tissue integrity Goals: Ulcer/skin breakdown will heal within 14 weeks Date Initiated: 12/23/2015 Goal Status: Active Interventions: Assess ulceration(s) every visit Notes: Electronic Signature(s) Signed: 02/04/2016 5:46:18 PM By: Elliot Gurney, RN, [name], [name], [name] Entered By: Elliot Gurney, RN, [name], Kim on 02/04/2016 15:48:28 Nicotra, [name] ([id number]) Musial, Ajna Elbert Ewings ([id number]) -------------------------------------------------------------------------------- Pain Assessment Details Patient Name: Madril, Mardene L. Date of Service: 02/04/2016 3:15 PM Medical Record Patient Account Number: [id number] Number: Treating RN: [name] 1956-06-07 (59 y.o. Other Clinician: Date of Birth/Sex: Female) Treating ROBSON, MICHAEL Primary Care Physician: PATIENT, NO Physician/Extender: G Referring Physician: Everette Rank in Treatment: 6 Active Problems Location of Pain Severity and Description of Pain Patient Has Paino No Site Locations With Dressing Change: No Pain Management and Medication Current Pain Management: Electronic Signature(s) Signed: 02/04/2016 5:46:18 PM By: Elliot Gurney, RN, [name], [name], [name] Entered By: Elliot Gurney, RN, [name], Kim on 02/04/2016 15:27:18 Dedman, [name] ([id number]) -------------------------------------------------------------------------------- Patient/Caregiver Education Details Patient Name: Hefty, Tyler L. Date of Service: 02/04/2016 3:15 PM Medical Record Patient Account Number: [id number] Number: Treating  RN: [name] [birth year] (59 y.o. Other Clinician: Date of Birth/Gender: Female) Treating ROBSON, MICHAEL Primary Care Physician: PATIENT, NO Physician/Extender: G Referring  Physician: Everette Rank in Treatment: 6 Education Assessment Education Provided To: Patient and Caregiver Education Topics Provided Wound/Skin Impairment: Handouts: Other: continue wound care as prescribed Methods: Demonstration Responses: State content correctly Electronic Signature(s) Signed: 02/04/2016 5:46:18 PM By: Elliot Gurney, RN, [name], [name], [name] Entered By: Elliot Gurney, RN, [name], Kim on 02/04/2016 15:52:49 Carbone, [name] ([id number]) -------------------------------------------------------------------------------- Wound Assessment Details Patient Name: Sebesta, Aliviah L. Date of Service: 02/04/2016 3:15 PM Medical Record Patient Account Number: [id number] Number: Treating RN: [name] 07/24/1956 (59 y.o. Other Clinician: Date of Birth/Sex: Female) Treating ROBSON, MICHAEL Primary Care Physician: PATIENT, NO Physician/Extender: G Referring Physician: Everette Rank in Treatment: 6 Wound Status Wound Number: 1 Primary Etiology: Venous Leg Ulcer Wound Location: Right Lower Leg - Medial Wound Status: Open Wounding Event: Blister Date Acquired: 12/15/2015 Weeks Of Treatment: 6 Clustered Wound: No Photos Photo Uploaded By: Elliot Gurney, RN, [name], Kim on 02/04/2016 16:12:48 Wound Measurements Length: (cm) 4 Width: (cm) 3.5 Depth: (cm) 0.1 Area: (cm) 10.996 Volume: (cm) 1.1 % Reduction in Area: 57.6% % Reduction in Volume: 57.6% Epithelialization: None Wound Description Classification: Partial Thickness Wound Margin: Flat and Intact Exudate Amount: Large Exudate Type: Serosanguineous Exudate Color: red, brown Foul Odor After Cleansing: No Wound Bed Granulation Amount: Large (67-100%) Exposed Structure Granulation Quality: Red, Hyper-granulation, Friable Fascia Exposed: No Necrotic Amount: Small (1-33%) Fat Layer Exposed: No Ranta, Leonie L. ([id number]) Necrotic Quality: Adherent Slough Tendon Exposed: No Muscle Exposed: No Joint Exposed:  No Bone Exposed: No Limited to Skin Breakdown Periwound Skin Texture Texture Color No Abnormalities Noted: No No Abnormalities Noted: No Callus: No Atrophie Blanche: No Crepitus: No Cyanosis: No Excoriation: No Ecchymosis: No Fluctuance: No Erythema: No Friable: No Hemosiderin Staining: Yes Induration: No Mottled: No Localized Edema: No Pallor: No Rash: No Rubor: No Scarring: No Temperature / Pain Moisture Temperature: No Abnormality No Abnormalities Noted: No Tenderness on Palpation: Yes Dry / Scaly: No Maceration: No Moist: Yes Wound Preparation Ulcer Cleansing: Other: soap and water, Topical Anesthetic Applied: Other: lidocaine 4%, Electronic Signature(s) Signed: 02/04/2016 5:46:18 PM By: Elliot Gurney, RN, [name], [name], [name] Entered By: Elliot Gurney, RN, [name], Kim on 02/04/2016 15:39:44 Lopezgarcia, [name] ([id number]) -------------------------------------------------------------------------------- Vitals Details Patient Name: Hornback, Laniqua L. Date of Service: 02/04/2016 3:15 PM Medical Record Patient Account Number: [id number] Number: Treating RN: [name] 1955-09-02 (59 y.o. Other Clinician: Date of Birth/Sex: Female) Treating ROBSON, MICHAEL Primary Care Physician: PATIENT, NO Physician/Extender: G Referring Physician: Everette Rank in Treatment: 6 Vital Signs Time Taken: 15:27 Temperature (F): 98.3 Height (in): 62 Pulse (bpm): 87 Weight (lbs): 170 Respiratory Rate (breaths/min): 18 Body Mass Index (BMI): 31.1 Blood Pressure (mmHg): 175/109 Reference Range: 80 - 120 mg / dl Notes BP is elevated today. Patient is instructed to follow-up with PCP regarding elevation. Recheck 160/100. Electronic Signature(s) Signed: 02/04/2016 5:46:18 PM By: Elliot Gurney, RN, [name], [name], [name] Entered By: Elliot Gurney, RN, [name], Kim on 02/04/2016 15:32:18

## 2016-02-05 NOTE — Progress Notes (Addendum)
[name], [name] ([id number]) Visit Report for 02/04/2016 Chief Complaint Document Details Swartout, Emberlyn Date of Service: 02/04/2016 3:15 PM Patient Name: [name] Account Number: [id number] Medical Record Treating RN: Huel Coventry [id number] Number: Other Clinician: [birth year] (60 y.o. Treating Stefen Juba Date of Birth/Sex: Female) Physician/Extender: G Primary Care PATIENT, NO Physician: Referring Physician: Everette Rank in Treatment: 6 Information Obtained from: Patient Chief Complaint Patient is here for review of a fairly substantial wound on her right medial lower leg that is been present for the last week Electronic Signature(s) Signed: 02/19/2016 7:33:08 AM By: [name] Entered By: Baltazar Najjar on 02/11/2016 09:55:45 [name], [name] ([id number]) -------------------------------------------------------------------------------- HPI Details [name] Date of Service: 02/04/2016 3:15 PM Patient Name: [name] Account Number: [id number] Medical Record Treating RN: Huel Coventry [id number] Number: Other Clinician: 1956/07/14 (60 y.o. Treating [name] Date of Birth/Sex: Female) Physician/Extender: G Primary Care PATIENT, NO Physician: Referring Physician: Everette Rank in Treatment: 6 History of Present Illness HPI Description: 12/23/15; this is a 60 year old lady who recently relocated back to [place] from [place]. She is staying with a family friend previously lived with a daughter in [place]. 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 She is on chronic prednisone she is not a diabetic. Dose of prednisone is 10 mg. The patient tells me that roughly a week ago she developed a fairly substantial blister over this area that then ruptured. She was seen in the emergency room on 5/7 an x-ray of the leg was negative she was put on Septra although I don't think  any cultures were done. She also tells me she has had chronic edema in her legs for a number of years. She had a similar presentation to currently in the right lateral lower leg roughly a year ago that she was able to heal on her own. As noted she has systemic lupus but does not have lupus nephritis according to the patient. She has a lot of edema in her lower legs. The ABI's could not be obtained. 12/31/15; the patient's insurance which is [place] [employer] makes her out of network for any home health therefore we have been changing her dressing in our facility. I reviewed her trip to the ER on 12/21/2015 her creatinine was within the normal range hemoglobin and white count normal. Culture of the wound was negative. X-ray of the leg showed soft tissue edema no foreign bodies. We have been dressing this with Aquacel Ag due to the amount of ongoing drainage 01/07/16; the patient had her arterial studies this morning I don't have these results area she comes back in to have [place] rewrap since she doesn't have access to home health 01/14/16 I still do not have the results of her arterial studies. Our nurse correctly pointed out that we've been wrapping the left leg without an open wound I think this had to do with the fact that she had so much edema when she came in I was concerned about leaving this unwrapped. 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 We left her left leg unwrapped last week as she has no open wound here today she has extensive edema here. We did order stockings however I believe the company called but they have not called them back. She comes back in today with  extensive edema in the left leg. We have her arterial studies which showed triphasic waves and all locations tested including her posterior tibial and anterior tibial arteries bilaterally. Report listed no  hemodynamically significant bilateral lower extremity arterial stenosis. She has her venous studies later this month. I'm going to rewrap the left leg due to the extent of the edema, transitioning her into the stocking we ordered last week as soon as one is available 01/27/16 the patient arrived today with pooled serosanguineous drainage over the wound it would not absorb into her Aquacel Ag which is somewhat on. In spite of this the dimensions of her wound are down Clemon, [name] ([id number]) considerably and the wound bed looks healthy without any need for debridement. She is obtained her juxtalite stockings the left leg which has no open wound. We are waiting for the right leg wound to get smaller before ordering her one for the right leg 02/03/16; patient wound looked improved. No debridement was required. We've been using the juxtalite stocking on the left leg which is no open wound. Electronic Signature(s) Signed: 02/19/2016 7:33:08 AM By: [name] Entered By: Baltazar Najjar on 02/11/2016 09:56:31 Isenhower, [name] ([id number]) -------------------------------------------------------------------------------- Physical Exam Details [name] Date of Service: 02/04/2016 3:15 PM Patient Name: [name] Account Number: [id number] Medical Record Treating RN: Huel Coventry [id number] Number: Other Clinician: Dec 26, 1955 (60 y.o. Treating [name] Date of Birth/Sex: Female) Physician/Extender: G Primary Care PATIENT, NO Physician: Referring Physician: Phineas [name] Weeks in Treatment: 6 Notes Wound exam; substantial wound on the right medial leg continues to make good progress. Advancing epithelialization. No debridement was required. There is no evidence of infection Electronic Signature(s) Signed: 02/19/2016 7:33:08 AM By: [name] Entered By: Baltazar Najjar on 02/11/2016 09:57:09 Guthmiller, [name]  ([id number]) -------------------------------------------------------------------------------- Physician Orders Details [name] Date of Service: 02/04/2016 3:15 PM Patient Name: [name] Account Number: [id number] Medical Record Treating RN: Huel Coventry [id number] Number: Other Clinician: 01-11-1956 (59 y.o. Treating Quiera Diffee Date of Birth/Sex: Female) Physician/Extender: G Primary Care PATIENT, NO Physician: Referring Physician: Everette Rank in Treatment: 6 Verbal / Phone Orders: Yes Clinician: Huel Coventry Read Back and Verified: Yes Diagnosis Coding Wound Cleansing Wound #1 Right,Medial Lower Leg o Cleanse wound with mild soap and water Anesthetic Wound #1 Right,Medial Lower Leg o Topical Lidocaine 4% cream applied to wound bed prior to debridement Skin Barriers/Peri-Wound Care Wound #1 Right,Medial Lower Leg o Barrier cream Primary Wound Dressing Wound #1 Right,Medial Lower Leg o Aquacel Ag - or equivalent Secondary Dressing Wound #1 Right,Medial Lower Leg o ABD pad Dressing Change Frequency Wound #1 Right,Medial Lower Leg o Change dressing every week Follow-up Appointments Wound #1 Right,Medial Lower Leg o Return Appointment in 1 week. o Nurse Visit as needed Edema Control Wound #1 Right,Medial Lower Leg Lovett, Alira L. ([id number]) o 4 Layer Compression System - Bilateral o Elevate legs to the level of the heart and pump ankles as often as possible Additional Orders / Instructions Wound #1 Right,Medial Lower Leg o Activity as tolerated Electronic Signature(s) Signed: 02/04/2016 4:37:54 PM By: [name] Signed: 02/04/2016 5:46:18 PM By: Elliot Gurney RN, [name], [name], [name] Entered By: Elliot Gurney, RN, [name], Kim on 02/04/2016 15:49:09 Barro, [name] ([id number]) -------------------------------------------------------------------------------- Problem List Details [name] Date of Service: 02/04/2016 3:15  PM Patient Name: [name] Account Number: [id number] Medical Record Treating RN: Huel Coventry [id number] Number: Other Clinician: 01-Jul-1956 (59 y.o. 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 Account Number: [id number] Medical Record Treating RN: Huel Coventry [id number] Number: Other Clinician: 11/16/1955 (59 y.o. Treating Orit Sanville Date of Birth/Sex: Female) Physician/Extender: G Primary Care PATIENT, NO Physician: Referring Physician: Everette Rank in Treatment: 6 Subjective Chief Complaint Information obtained from Patient Patient is here for review of a fairly substantial wound on her right medial lower leg that is been present for the last week History of Present Illness (HPI) 12/23/15; this is a 60 year old lady who recently relocated back to [place] from [place]. She is staying with a family friend previously lived with a daughter in [place]. 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 She is on chronic prednisone  she is not a diabetic. Dose of prednisone is 10 mg. The patient tells me that roughly a week ago she developed a fairly substantial blister over this area that then ruptured. She was seen in the emergency room on 5/7 an x-ray of the leg was negative she was put on Septra although I don't think any cultures were done. She also tells me she has had chronic edema in her legs for a number of years. She had a similar presentation to currently in the right lateral lower leg roughly a year ago that she was able to heal on her own. As noted she has systemic lupus but does not have lupus nephritis according to the patient. She has a lot of edema in her lower legs. The ABI's could not be obtained. 12/31/15; the patient's insurance which is [place] [employer] makes her out of network for any home health therefore we have been changing her dressing in our facility. I reviewed her trip to the ER on 12/21/2015 her creatinine was within the normal range hemoglobin and white count normal. Culture of the wound was negative. X-ray of the leg showed soft tissue edema no foreign bodies. We have been dressing this with Aquacel Ag due to the amount of ongoing drainage 01/07/16; the patient had her arterial studies this morning I don't have these results area she comes back in to have [place] rewrap since she doesn't have access to home health 01/14/16 I still do not have the results of her arterial studies. Our nurse correctly pointed out that we've been wrapping the left leg without an open wound I think this had to do with the fact that she had so much edema when she came in I was concerned about leaving this unwrapped. 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 We left her left leg unwrapped last week as she has no open wound here today she has extensive edema here. We did order stockings however I  believe the company called but they have not called them Mutchler, [name] (  [id number]) back. She comes back in today with extensive edema in the left leg. We have her arterial studies which showed triphasic waves and all locations tested including her posterior tibial and anterior tibial arteries bilaterally. Report listed no hemodynamically significant bilateral lower extremity arterial stenosis. She has her venous studies later this month. I'm going to rewrap the left leg due to the extent of the edema, transitioning her into the stocking we ordered last week as soon as one is available 01/27/16 the patient arrived today with pooled serosanguineous drainage over the wound it would not absorb into her Aquacel Ag which is somewhat on. In spite of this the dimensions of her wound are down considerably and the wound bed looks healthy without any need for debridement. She is obtained her juxtalite stockings the left leg which has no open wound. We are waiting for the right leg wound to get smaller before ordering her one for the right leg 02/03/16; patient wound looked improved. No debridement was required. We've been using the juxtalite stocking on the left leg which is no open wound. Objective Constitutional Vitals Time Taken: 3:27 PM, Height: 62 in, Weight: 170 lbs, BMI: 31.1, Temperature: 98.3 F, Pulse: 87 bpm, Respiratory Rate: 18 breaths/min, Blood Pressure: 175/109 mmHg. General Notes: BP is elevated today. Patient is instructed to follow-up with PCP regarding elevation. Recheck 160/100. Integumentary (Hair, Skin) Wound #1 status is Open. Original cause of wound was Blister. The wound is located on the Right,Medial Lower Leg. The wound measures 4cm length x 3.5cm width x 0.1cm depth; 10.996cm^2 area and 1.1cm^3 volume. The wound is limited to skin breakdown. There is a large amount of serosanguineous drainage noted. The wound margin is flat and intact. There is large (67-100%) red,  friable granulation within the wound bed. There is a small (1-33%) amount of necrotic tissue within the wound bed including Adherent Slough. The periwound skin appearance exhibited: Moist, Hemosiderin Staining. The periwound skin appearance did not exhibit: Callus, Crepitus, Excoriation, Fluctuance, Friable, Induration, Localized Edema, Rash, Scarring, Dry/Scaly, Maceration, Atrophie Blanche, Cyanosis, Ecchymosis, Mottled, Pallor, Rubor, Erythema. Periwound temperature was noted as No Abnormality. The periwound has tenderness on palpation. Assessment Active Problems ICD-10 I87.331 - 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 lower extremity I87.322 - Chronic venous hypertension (idiopathic) with inflammation of left lower extremity M32.10 - Systemic lupus erythematosus, organ or system involvement unspecified Mochizuki, Layonna L. ([id number]) Plan Wound Cleansing: Wound #1 Right,Medial Lower Leg: Cleanse wound with mild soap and water Anesthetic: Wound #1 Right,Medial Lower Leg: Topical Lidocaine 4% cream applied to wound bed prior to debridement Skin Barriers/Peri-Wound Care: Wound #1 Right,Medial Lower Leg: Barrier cream Primary Wound Dressing: Wound #1 Right,Medial Lower Leg: Aquacel Ag - or equivalent Secondary Dressing: Wound #1 Right,Medial Lower Leg: ABD pad Dressing Change Frequency: Wound #1 Right,Medial Lower Leg: Change dressing every week Follow-up Appointments: Wound #1 Right,Medial Lower Leg: Return Appointment in 1 week. Nurse Visit as needed Edema Control: Wound #1 Right,Medial Lower Leg: 4 Layer Compression System - Bilateral Elevate legs to the level of the heart and pump ankles as often as possible Additional Orders / Instructions: Wound #1 Right,Medial Lower Leg: Activity as tolerated Aquacel AG ABD, profore wrap Electronic Signature(s) Signed: 02/19/2016 7:33:08 AM By: [name] Mandrell, [name]  ([id number]) Entered By: Baltazar Najjar on 02/11/2016 09:58:15 Parfait, [name] ([id number]) -------------------------------------------------------------------------------- SuperBill Details Grizzle, Sarena Date of Service: 02/04/2016 Patient Name: [name] Account Number: [id number] Medical Record Treating RN: Huel Coventry  [id number] Number: Other Clinician: 1955-10-10 (59 y.o. Treating Kaylani Fromme Date of Birth/Sex: Female) Physician/Extender: G Primary Care Weeks in Treatment: 6 PATIENT, NO Physician: Referring Physician: Phineas [name] Diagnosis Coding ICD-10 Codes Code Description 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 lower I87.331 extremity I87.322 Chronic venous hypertension (idiopathic) with inflammation of left lower extremity M32.10 Systemic lupus erythematosus, organ or system involvement unspecified Facility Procedures CPT4: Description Modifier Quantity Code [id number] (Facility Use Only) [id number] - APPLY MULTLAY COMPRS LWR RT 1 LEG Physician Procedures CPT4: Description Modifier Quantity Code [id number] 81103 - WC PHYS LEVEL 2 - EST PT 1 ICD-10 Description Diagnosis I87.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

## 2016-02-11 ENCOUNTER — Encounter: Payer: Medicare Other | Admitting: Internal Medicine

## 2016-02-11 DIAGNOSIS — I87331 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 lower extremity: Secondary | ICD-10-CM | POA: Diagnosis not present

## 2016-02-13 NOTE — Progress Notes (Signed)
[name], [name] ([id number]) Visit Report for 02/11/2016 Chief Complaint Document Details [name] Date of Service: 02/11/2016 3:00 PM Patient Name: [name] Account Number: [id number] Medical Record Treating RN: [name] [id number] Number: Other Clinician: 04-Jul-1956 (59 y.[name]. Treating [name] Date of Birth/Sex: Female) Physician/Extender: G Primary Care PATIENT, NO Physician: Referring Physician: Everette [name] in Treatment: 7 Information Obtained from: Patient Chief Complaint Patient is here for review of a fairly substantial wound on her right medial lower leg that is been present for the last week Electronic Signature(s) Signed: 02/11/2016 4:24:59 PM By: [name] Entered By: [name] on 02/11/2016 15:30:48 [name], [name] ([id number]) -------------------------------------------------------------------------------- HPI Details [name] Date of Service: 02/11/2016 3:00 PM Patient Name: [name] Account Number: [id number] Medical Record Treating RN: [name] [id number] Number: Other Clinician: 02/27/1956 (59 y.[name]. Treating [name] Date of Birth/Sex: Female) Physician/Extender: G Primary Care PATIENT, NO Physician: Referring Physician: Everette [name] in Treatment: 7 History of Present Illness HPI Description: 12/23/15; this is a 60 year old lady who recently relocated back to [place] from [place]. She is staying with a family friend previously lived with a daughter in [place]. 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 She is on chronic prednisone she is not a diabetic. Dose of prednisone is 10 mg. The patient tells me that roughly a week ago she developed a fairly substantial blister over this area that then ruptured. She was seen in the emergency room on 5/7 an x-ray of the leg was negative she was put on Septra although I don't  think any cultures were done. She also tells me she has had chronic edema in her legs for a number of years. She had a similar presentation to currently in the right lateral lower leg roughly a year ago that she was able to heal on her own. As noted she has systemic lupus but does not have lupus nephritis according to the patient. She has a lot of edema in her lower legs. The ABI's could not be obtained. 12/31/15; the patient's insurance which is [place] [employer] makes her out of network for any home health therefore we have been changing her dressing in our facility. I reviewed her trip to the ER on 12/21/2015 her creatinine was within the normal range hemoglobin and white count normal. Culture of the wound was negative. X-ray of the leg showed soft tissue edema no foreign bodies. We have been dressing this with Aquacel Ag due to the amount of ongoing drainage 01/07/16; the patient had her arterial studies this morning I don't have these results area she comes back in to have [place] rewrap since she doesn't have access to home health 01/14/16 I still do not have the results of her arterial studies. Our nurse correctly pointed out that we've been wrapping the left leg without an open wound I think this had to do with the fact that she had so much edema when she came in I was concerned about leaving this unwrapped. 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 We left her left leg unwrapped last week as she has no open wound here today she has extensive edema here. We did order stockings however I believe the company called but they have not called them back. She comes back in today with  extensive edema in the left leg. We have her arterial studies which showed triphasic waves and all locations tested including her posterior tibial and anterior tibial arteries bilaterally. Report listed  no hemodynamically significant bilateral lower extremity arterial stenosis. She has her venous studies later this month. I'm going to rewrap the left leg due to the extent of the edema, transitioning her into the stocking we ordered last week as soon as one is available 01/27/16 the patient arrived today with pooled serosanguineous drainage over the wound it would not absorb into her Aquacel Ag which is somewhat on. In spite of this the dimensions of her wound are down Benevides, [name] considerably and the wound bed looks healthy without any need for debridement. She is obtained her juxtalite stockings the left leg which has no open wound. We are waiting for the right leg wound to get smaller before ordering her one for the right leg 02/03/16; patient wound looked improved. No debridement was required. We've been using the juxtalite stocking on the left leg which is no open wound. 02/11/16 wound is remarkably better. No debridement was required. Healthy rim of epithelialization. We have been using Aquacel Ag, we'll try to close this down with RTD over the next 2 weeks Electronic Signature(s) Signed: 02/11/2016 4:24:59 PM By: [name] Entered By: [name] on 02/11/2016 15:32:00 [name], [name] ([id number]) -------------------------------------------------------------------------------- Physical Exam Details [name] Date of Service: 02/11/2016 3:00 PM Patient Name: [name] Account Number: [id number] Medical Record Treating RN: [name] [id number] Number: Other Clinician: 1955-11-13 (59 y.[name]. Treating [name] Date of Birth/Sex: Female) Physician/Extender: G Primary Care PATIENT, NO Physician: Referring Physician: Everette [name] in Treatment: 7 Constitutional Patient is hypertensive.. Pulse regular and within target range for patient.Marland Kitchen Respirations regular, non-labored and within target range.. Temperature is normal and within the  target range for the patient.. Eyes Conjunctivae clear. No discharge.Marland Kitchen Respiratory Respiratory effort is easy and symmetric bilaterally. Rate is normal at rest and on room air.. Cardiovascular Pedal pulses palpable and strong bilaterally.. Edema secondary to bilateral venous insufficiency however edema is controlled on the right leg. Lymphatic None palpable in the popliteal or inguinal area. Psychiatric Patient appears depressed today.. Notes Wound exam; the original substantial wound on the right medial leg continues to make good progress. There is advancing epithelialization on all fronts. No debridement was required there is no evidence of infection Electronic Signature(s) Signed: 02/11/2016 4:24:59 PM By: [name] Entered By: [name] on 02/11/2016 15:34:13 [name], [name] ([id number]) -------------------------------------------------------------------------------- Physician Orders Details [name] Date of Service: 02/11/2016 3:00 PM Patient Name: [name] Account Number: [id number] Medical Record Treating RN: [name] [id number] Number: Other Clinician: 1956-03-03 (59 y.[name]. Treating Joda Braatz Date of Birth/Sex: Female) Physician/Extender: G Primary Care PATIENT, NO Physician: Referring Physician: Everette [name] in Treatment: 7 Verbal / Phone Orders: Yes Clinician: Huel [name] Read Back and Verified: Yes Diagnosis Coding Wound Cleansing Wound #1 Right,Medial Lower Leg [name] Cleanse wound with mild soap and water Anesthetic Wound #1 Right,Medial Lower Leg [name] Topical Lidocaine 4% cream applied to wound bed prior to debridement Skin Barriers/Peri-Wound Care Wound #1 Right,Medial Lower Leg [name] Barrier cream Primary Wound Dressing Wound #1 Right,Medial Lower Leg [name] Other: - RTD Secondary Dressing Wound #1 Right,Medial Lower Leg [name] ABD pad Dressing Change Frequency Wound #1 Right,Medial Lower Leg [name] Change dressing every  week Follow-up Appointments Wound #1 Right,Medial Lower Leg [name] Return Appointment in 1 week. [name] Nurse Visit as  needed Edema Control Wound #1 Right,Medial Lower Leg Landess, Caly L. ([id number]) [name] 4 Layer Compression System - Bilateral [name] Elevate legs to the level of the heart and pump ankles as often as possible Additional Orders / Instructions Wound #1 Right,Medial Lower Leg [name] Activity as tolerated Electronic Signature(s) Signed: 02/11/2016 4:24:59 PM By: [name] Signed: 02/12/2016 3:00:23 PM By: Elliot Gurney RN, [name], [name], [name] Entered By: Elliot Gurney, RN, [name], Kim on 02/11/2016 15:27:21 [name], [name] ([id number]) -------------------------------------------------------------------------------- Problem List Details [name] Date of Service: 02/11/2016 3:00 PM Patient Name: [name] Account Number: [id number] Medical Record Treating RN: [name] [id number] Number: Other Clinician: October 14, 1955 (59 y.[name]. 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 Account Number: [id number] Medical  Record Treating RN: [name] [id number] Number: Other Clinician: Feb 06, 1956 (59 y.[name]. Treating Alanie Syler Date of Birth/Sex: Female) Physician/Extender: G Primary Care PATIENT, NO Physician: Referring Physician: Everette [name] in Treatment: 7 Subjective Chief Complaint Information obtained from Patient Patient is here for review of a fairly substantial wound on her right medial lower leg that is been present for the last week History of Present Illness (HPI) 12/23/15; this is a 60 year old lady who recently relocated back to [place] from [place]. She is staying with a family friend previously lived with a daughter in [place]. 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 She is on chronic prednisone she is not a diabetic. Dose of prednisone is 10 mg. The patient tells me that roughly a week ago she developed a fairly substantial blister over this area that then ruptured. She was seen in the emergency room on 5/7 an x-ray of the leg was negative she was put on Septra although I don't think any cultures were done. She also tells me she has had chronic edema in her legs for a number of years. She had a similar presentation to currently in the right lateral lower leg roughly a year ago that she was able to heal on her own. As noted she has systemic lupus but does not have lupus nephritis according to the patient. She has a lot of edema in her lower legs. The ABI's could not be obtained. 12/31/15; the patient's insurance which is [place] [employer] makes her out of network for any home health therefore we have been changing her dressing in our facility. I reviewed her trip to the ER on 12/21/2015 her creatinine was within the normal range hemoglobin and white count normal. Culture of the wound was negative. X-ray of the leg showed soft tissue edema no foreign bodies. We have been dressing this with Aquacel Ag due to the  amount of ongoing drainage 01/07/16; the patient had her arterial studies this morning I don't have these results area she comes back in to have [place] rewrap since she doesn't have access to home  health 01/14/16 I still do not have the results of her arterial studies. Our nurse correctly pointed out that we've been wrapping the left leg without an open wound I think this had to do with the fact that she had so much edema when she came in I was concerned about leaving this unwrapped. 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 We left her left leg unwrapped last week as she has no open wound here today she has extensive edema here. We did order stockings however I believe the company called but they have not called them [name], [name] ([id number]) back. She comes back in today with extensive edema in the left leg. We have her arterial studies which showed triphasic waves and all locations tested including her posterior tibial and anterior tibial arteries bilaterally. Report listed no hemodynamically significant bilateral lower extremity arterial stenosis. She has her venous studies later this month. I'm going to rewrap the left leg due to the extent of the edema, transitioning her into the stocking we ordered last week as soon as one is available 01/27/16 the patient arrived today with pooled serosanguineous drainage over the wound it would not absorb into her Aquacel Ag which is somewhat on. In spite of this the dimensions of her wound are down considerably and the wound bed looks healthy without any need for debridement. She is obtained her juxtalite stockings the left leg which has no open wound. We are waiting for the right leg wound to get smaller before ordering her one for the right leg 02/03/16; patient wound looked improved. No debridement was required. We've been  using the juxtalite stocking on the left leg which is no open wound. 02/11/16 wound is remarkably better. No debridement was required. Healthy rim of epithelialization. We have been using Aquacel Ag, we'll try to close this down with RTD over the next 2 weeks Objective Constitutional Patient is hypertensive.. Pulse regular and within target range for patient.Marland Kitchen Respirations regular, non-labored and within target range.. Temperature is normal and within the target range for the patient.. Vitals Time Taken: 3:10 PM, Height: 62 in, Weight: 170 lbs, BMI: 31.1, Temperature: 98.4 F, Pulse: 92 bpm, Respiratory Rate: 18 breaths/min, Blood Pressure: 172/102 mmHg. General Notes: 02/11/2016: BP is elevated today. Patient is instructed to follow-up with PCP regarding elevation. Eyes Conjunctivae clear. No discharge.Marland Kitchen Respiratory Respiratory effort is easy and symmetric bilaterally. Rate is normal at rest and on room air.. Cardiovascular Pedal pulses palpable and strong bilaterally.. Edema secondary to bilateral venous insufficiency however edema is controlled on the right leg. Lymphatic None palpable in the popliteal or inguinal area. Psychiatric Patient appears depressed today.. General Notes: Wound exam; the original substantial wound on the right medial leg continues to make good [name], [name] ([id number]) progress. There is advancing epithelialization on all fronts. No debridement was required there is no evidence of infection Integumentary (Hair, Skin) Wound #1 status is Open. Original cause of wound was Blister. The wound is located on the Right,Medial Lower Leg. The wound measures 3cm length x 2cm width x 0.1cm depth; 4.712cm^2 area and 0.471cm^3 volume. The wound is limited to skin breakdown. There is a large amount of serosanguineous drainage noted. The wound margin is flat and intact. There is large (67-100%) red, friable granulation within the wound bed. There is no necrotic tissue  within the wound bed. The periwound skin  appearance exhibited: Moist, Hemosiderin Staining. The periwound skin appearance did not exhibit: Callus, Crepitus, Excoriation, Fluctuance, Friable, Induration, Localized Edema, Rash, Scarring, Dry/Scaly, Maceration, Atrophie Blanche, Cyanosis, Ecchymosis, Mottled, Pallor, Rubor, Erythema. Periwound temperature was noted as No Abnormality. The periwound has tenderness on palpation. Assessment Active Problems ICD-10 I87.331 - 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 lower extremity I87.322 - Chronic venous hypertension (idiopathic) with inflammation of left lower extremity M32.10 - Systemic lupus erythematosus, organ or system involvement unspecified Plan Wound Cleansing: Wound #1 Right,Medial Lower Leg: Cleanse wound with mild soap and water Anesthetic: Wound #1 Right,Medial Lower Leg: Topical Lidocaine 4% cream applied to wound bed prior to debridement Skin Barriers/Peri-Wound Care: Wound #1 Right,Medial Lower Leg: Barrier cream Primary Wound Dressing: Wound #1 Right,Medial Lower Leg: Other: - RTD Secondary Dressing: Wound #1 Right,Medial Lower Leg: ABD pad Dressing Change Frequency: Wound #1 Right,Medial Lower Leg: Pevehouse, Madaline L. ([id number]) Change dressing every week Follow-up Appointments: Wound #1 Right,Medial Lower Leg: Return Appointment in 1 week. Nurse Visit as needed Edema Control: Wound #1 Right,Medial Lower Leg: 4 Layer Compression System - Bilateral Elevate legs to the level of the heart and pump ankles as often as possible Additional Orders / Instructions: Wound #1 Right,Medial Lower Leg: Activity as tolerated #1RTD,aBD,profore She appears to be making good progress Electronic Signature(s) Signed: 02/11/2016 4:24:59 PM By: [name] Entered By: [name] on 02/11/2016 15:35:55 [name], [name]  ([id number]) -------------------------------------------------------------------------------- SuperBill Details [name], [name] Date of Service: 02/11/2016 Patient Name: [name] Account Number: [id number] Medical Record Treating RN: [name] [id number] Number: Other Clinician: 29-Sep-1955 (59 y.[name]. Treating Alexyss Balzarini Date of Birth/Sex: Female) Physician/Extender: G Primary Care Weeks in Treatment: 7 PATIENT, NO Physician: Referring Physician: Phineas Semen Diagnosis Coding ICD-10 Codes Code Description 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 lower I87.331 extremity I87.322 Chronic venous hypertension (idiopathic) with inflammation of left lower extremity M32.10 Systemic lupus erythematosus, organ or system involvement unspecified Facility Procedures CPT4: Description Modifier Quantity Code [id number] (Facility Use Only) [phone] - APPLY MULTLAY COMPRS LWR RT 1 LEG Physician Procedures CPT4: Description Modifier Quantity Code [id number] - WC PHYS LEVEL 3 - EST PT 1 ICD-10 Description Diagnosis I87.331 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 lower extremity Electronic Signature(s) Unsigned Previous Signature: 02/11/2016 4:24:59 PM Version By: [name] Entered By: Elliot Gurney, RN, [name], Kim on 02/12/2016 17:01:51 Signature(s): Date(s):

## 2016-02-13 NOTE — Progress Notes (Signed)
LUELLAR, SEACAT ([id number]) Visit Report for 02/11/2016 Arrival Information Details Patient Name: [name], [name] Date of Service: 02/11/2016 3:00 PM Medical Record Patient Account Number: [id number] Number: Treating RN: Huel Coventry 1955/10/16 (60 y.o. Other Clinician: Date of Birth/Sex: Female) Treating ROBSON, MICHAEL Primary Care Physician: PATIENT, NO Physician/Extender: G Referring Physician: Everette Rank in Treatment: 7 Visit Information History Since Last Visit Added or deleted any medications: No Patient Arrived: Ambulatory Any new allergies or adverse reactions: No Arrival Time: 15:06 Had a fall or experienced change in No Accompanied By: son, Trey Paula activities of daily living that may affect Transfer Assistance: None risk of falls: Patient Identification Verified: Yes Signs or symptoms of abuse/neglect since last No Secondary Verification Process Yes visito Completed: Hospitalized since last visit: No Patient Requires Transmission-Based No Pain Present Now: No Precautions: Patient Has Alerts: No Electronic Signature(s) Signed: 02/12/2016 3:00:23 PM By: Elliot Gurney, RN, [name], [name], [name] Entered By: Elliot Gurney, RN, [name], Kim on 02/11/2016 15:10:20 Sabine, [name] ([id number]) -------------------------------------------------------------------------------- Encounter Discharge Information Details Patient Name: [name], [name] Date of Service: 02/11/2016 3:00 PM Medical Record Patient Account Number: [id number] Number: Treating RN: Huel Coventry February 03, 1956 (60 y.o. Other Clinician: Date of Birth/Sex: Female) Treating ROBSON, MICHAEL Primary Care Physician: PATIENT, NO Physician/Extender: G Referring Physician: Everette Rank in Treatment: 7 Encounter Discharge Information Items Discharge Pain Level: 0 Discharge Condition: Stable Ambulatory Status: Ambulatory Discharge Destination: Home Transportation: Private Auto Accompanied  By: son, Trey Paula Schedule Follow-up Appointment: Yes Medication Reconciliation completed and provided to Patient/Care Yes Anelise Staron: Provided on Clinical Summary of Care: 02/11/2016 Form Type Recipient Paper Patient GC Electronic Signature(s) Signed: 02/11/2016 3:45:08 PM By: Gwenlyn Perking Entered By: Gwenlyn Perking on 02/11/2016 15:45:08 Debruyn, [name] ([id number]) -------------------------------------------------------------------------------- Lower Extremity Assessment Details Patient Name: [name], [name] Date of Service: 02/11/2016 3:00 PM Medical Record Patient Account Number: [id number] Number: Treating RN: Huel Coventry [birth year] (60 y.o. Other Clinician: Date of Birth/Sex: Female) Treating ROBSON, MICHAEL Primary Care Physician: PATIENT, NO Physician/Extender: G Referring Physician: Everette Rank in Treatment: 7 Edema Assessment Assessed: [Left: No] [Right: No] E[Left: dema] [Right: :] Calf Left: Right: Point of Measurement: 31 cm From Medial Instep cm 40.4 cm Ankle Left: Right: Point of Measurement: 11 cm From Medial Instep cm 24.5 cm Vascular Assessment Pulses: Posterior Tibial Dorsalis Pedis Palpable: [Right:Yes] Extremity colors, hair growth, and conditions: Extremity Color: [Right:Hyperpigmented] Hair Growth on Extremity: [Right:No] Temperature of Extremity: [Right:Warm] Capillary Refill: [Right:< 3 seconds] Toe Nail Assessment Left: Right: Thick: Yes Discolored: Yes Deformed: No Improper Length and Hygiene: No Electronic Signature(s) Signed: 02/12/2016 3:00:23 PM By: Elliot Gurney, RN, [name], [name], [name] Entered By: Elliot Gurney, RN, [name], Kim on 02/11/2016 15:16:33 Enlow, [name] ([id number]) Riede, Willard Elbert Ewings ([id number]) -------------------------------------------------------------------------------- Multi Wound Chart Details Patient Name: [name], [name] Date of Service: 02/11/2016 3:00 PM Medical Record Patient Account Number:  [id number] Number: Treating RN: Huel Coventry [birth year] (60 y.o. Other Clinician: Date of Birth/Sex: Female) Treating ROBSON, MICHAEL Primary Care Physician: PATIENT, NO Physician/Extender: G Referring Physician: Everette Rank in Treatment: 7 Vital Signs Height(in): 62 Pulse(bpm): 92 Weight(lbs): 170 Blood Pressure 172/102 (mmHg): Body Mass Index(BMI): 31 Temperature(F): 98.4 Respiratory Rate 18 (breaths/min): Photos: [N/A:N/A] Wound Location: Right Lower Leg - Medial N/A N/A Wounding Event: Blister N/A N/A Primary Etiology: Venous Leg Ulcer N/A N/A Date Acquired: 12/15/2015 N/A N/A Weeks of Treatment: 7 N/A N/A Wound Status: Open N/A N/A Measurements L x W x D 3x2x0.1 N/A N/A (cm) Area (cm) :  4.712 N/A N/A Volume (cm) : 0.471 N/A N/A % Reduction in Area: 81.80% N/A N/A % Reduction in Volume: 81.80% N/A N/A Classification: Partial Thickness N/A N/A Exudate Amount: Large N/A N/A Exudate Type: Serosanguineous N/A N/A Exudate Color: red, brown N/A N/A Wound Margin: Flat and Intact N/A N/A Granulation Amount: Large (67-100%) N/A N/A Granulation Quality: Red, Hyper-granulation, N/A N/A Friable Necrotic Amount: None Present (0%) N/A N/A Davilla, Melisha L. ([id number]) Exposed Structures: Fascia: No N/A N/A Fat: No Tendon: No Muscle: No Joint: No Bone: No Limited to Skin Breakdown Epithelialization: None N/A N/A Periwound Skin Texture: Edema: No N/A N/A Excoriation: No Induration: No Callus: No Crepitus: No Fluctuance: No Friable: No Rash: No Scarring: No Periwound Skin Moist: Yes N/A N/A Moisture: Maceration: No Dry/Scaly: No Periwound Skin Color: Hemosiderin Staining: Yes N/A N/A Atrophie Blanche: No Cyanosis: No Ecchymosis: No Erythema: No Mottled: No Pallor: No Rubor: No Temperature: No Abnormality N/A N/A Tenderness on Yes N/A N/A Palpation: Wound Preparation: Ulcer Cleansing: Other: N/A N/A soap and water Topical  Anesthetic Applied: Other: lidocaine 4% Treatment Notes Electronic Signature(s) Signed: 02/12/2016 3:00:23 PM By: Elliot Gurney, RN, [name], [name], [name] Entered By: Elliot Gurney, RN, [name], Kim on 02/11/2016 15:26:15 Tandy, [name] ([id number]) -------------------------------------------------------------------------------- Multi-Disciplinary Care Plan Details Patient Name: Lazcano, Hudson L. Date of Service: 02/11/2016 3:00 PM Medical Record Patient Account Number: [id number] Number: Treating RN: Huel Coventry March 02, 1956 (59 y.o. Other Clinician: Date of Birth/Sex: Female) Treating ROBSON, MICHAEL Primary Care Physician: PATIENT, NO Physician/Extender: G Referring Physician: Everette Rank in Treatment: 7 Active Inactive Abuse / Safety / Falls / Self Care Management Nursing Diagnoses: Impaired physical mobility Potential for falls Goals: Patient will remain injury free Date Initiated: 12/23/2015 Goal Status: Active Interventions: Assess fall risk on admission and as needed Notes: Nutrition Nursing Diagnoses: Imbalanced nutrition Goals: Patient/caregiver verbalizes understanding of need to maintain therapeutic glucose control per primary care physician Date Initiated: 12/23/2015 Goal Status: Active Interventions: Provide education on nutrition Treatment Activities: Education provided on Nutrition : 01/21/2016 Notes: Orientation to the Wound Care Program JEROLENE, KUPFER ([id number]) Nursing Diagnoses: Knowledge deficit related to the wound healing center program Goals: Patient/caregiver will verbalize understanding of the Wound Healing Center Program Date Initiated: 12/23/2015 Goal Status: Active Interventions: Provide education on orientation to the wound center Notes: Venous Leg Ulcer Nursing Diagnoses: Potential for venous Insuffiency (use before diagnosis confirmed) Goals: Patient will maintain optimal edema control Date Initiated: 12/23/2015 Goal Status:  Active Interventions: Provide education on venous insufficiency Notes: Wound/Skin Impairment Nursing Diagnoses: Impaired tissue integrity Goals: Ulcer/skin breakdown will heal within 14 weeks Date Initiated: 12/23/2015 Goal Status: Active Interventions: Assess ulceration(s) every visit Notes: Electronic Signature(s) Signed: 02/12/2016 3:00:23 PM By: Elliot Gurney, RN, [name], [name], [name] Entered By: Elliot Gurney, RN, [name], Kim on 02/11/2016 15:26:07 Temples, [name] ([id number]) Rini, Janelys Elbert Ewings ([id number]) -------------------------------------------------------------------------------- Pain Assessment Details Patient Name: Debellis, Shalynn L. Date of Service: 02/11/2016 3:00 PM Medical Record Patient Account Number: [id number] Number: Treating RN: Huel Coventry 1956/01/15 (59 y.o. Other Clinician: Date of Birth/Sex: Female) Treating ROBSON, MICHAEL Primary Care Physician: PATIENT, NO Physician/Extender: G Referring Physician: Everette Rank in Treatment: 7 Active Problems Location of Pain Severity and Description of Pain Patient Has Paino No Site Locations With Dressing Change: No Pain Management and Medication Current Pain Management: Electronic Signature(s) Signed: 02/12/2016 3:00:23 PM By: Elliot Gurney, RN, [name], [name], [name] Entered By: Elliot Gurney, RN, [name], Kim on 02/11/2016 15:10:26 Feigel, [name] ([id number]) -------------------------------------------------------------------------------- Patient/Caregiver Education Details Patient Name:  Stiff, Nyasha L. Date of Service: 02/11/2016 3:00 PM Medical Record Patient Account Number: [id number] Number: Treating RN: Huel Coventry 06-11-1956 (59 y.o. Other Clinician: Date of Birth/Gender: Female) Treating ROBSON, MICHAEL Primary Care Physician: PATIENT, NO Physician/Extender: G Referring Physician: Everette Rank in Treatment: 7 Education Assessment Education Provided To: Patient Education Topics  Provided Venous: Handouts: Controlling Swelling with Multilayered Compression Wraps Methods: Demonstration, Explain/Verbal Responses: State content correctly Electronic Signature(s) Signed: 02/12/2016 3:00:23 PM By: Elliot Gurney, RN, [name], [name], [name] Entered By: Elliot Gurney, RN, [name], Kim on 02/11/2016 15:44:54 Benavides, [name] ([id number]) -------------------------------------------------------------------------------- Wound Assessment Details Patient Name: Zwiebel, Herbert L. Date of Service: 02/11/2016 3:00 PM Medical Record Patient Account Number: [id number] Number: Treating RN: Huel Coventry 1956/02/06 (59 y.o. Other Clinician: Date of Birth/Sex: Female) Treating ROBSON, MICHAEL Primary Care Physician: PATIENT, NO Physician/Extender: G Referring Physician: Everette Rank in Treatment: 7 Wound Status Wound Number: 1 Primary Etiology: Venous Leg Ulcer Wound Location: Right Lower Leg - Medial Wound Status: Open Wounding Event: Blister Date Acquired: 12/15/2015 Weeks Of Treatment: 7 Clustered Wound: No Photos Wound Measurements Length: (cm) 3 Width: (cm) 2 Depth: (cm) 0.1 Area: (cm) 4.712 Volume: (cm) 0.471 % Reduction in Area: 81.8% % Reduction in Volume: 81.8% Epithelialization: None Wound Description Classification: Partial Thickness Wound Margin: Flat and Intact Exudate Amount: Large Exudate Type: Serosanguineous Exudate Color: red, brown Foul Odor After Cleansing: No Wound Bed Granulation Amount: Large (67-100%) Exposed Structure Granulation Quality: Red, Hyper-granulation, Friable Fascia Exposed: No Necrotic Amount: None Present (0%) Fat Layer Exposed: No Huckins, Iasha L. ([id number]) Tendon Exposed: No Muscle Exposed: No Joint Exposed: No Bone Exposed: No Limited to Skin Breakdown Periwound Skin Texture Texture Color No Abnormalities Noted: No No Abnormalities Noted: No Callus: No Atrophie Blanche: No Crepitus: No Cyanosis:  No Excoriation: No Ecchymosis: No Fluctuance: No Erythema: No Friable: No Hemosiderin Staining: Yes Induration: No Mottled: No Localized Edema: No Pallor: No Rash: No Rubor: No Scarring: No Temperature / Pain Moisture Temperature: No Abnormality No Abnormalities Noted: No Tenderness on Palpation: Yes Dry / Scaly: No Maceration: No Moist: Yes Wound Preparation Ulcer Cleansing: Other: soap and water, Topical Anesthetic Applied: Other: lidocaine 4%, Treatment Notes Wound #1 (Right, Medial Lower Leg) 1. Cleansed with: Clean wound with Normal Saline 2. Anesthetic Topical Lidocaine 4% cream to wound bed prior to debridement 4. Dressing Applied: Other dressing (specify in notes) 5. Secondary Dressing Applied ABD Pad 7. Secured with 4-Layer Compression System - Right Lower Extremity Notes RTD Electronic Signature(s) Signed: 02/12/2016 3:00:23 PM By: Elliot Gurney, RN, [name], [name], [name] Entered By: Elliot Gurney, RN, [name], Kim on 02/11/2016 15:18:08 Virgil, [name] ([id number]) Scafidi, [name] ([id number]) -------------------------------------------------------------------------------- Vitals Details Patient Name: Mumme, Deyanna L. Date of Service: 02/11/2016 3:00 PM Medical Record Patient Account Number: [id number] Number: Treating RN: Huel Coventry 12/12/1955 (59 y.o. Other Clinician: Date of Birth/Sex: Female) Treating ROBSON, MICHAEL Primary Care Physician: PATIENT, NO Physician/Extender: G Referring Physician: Everette Rank in Treatment: 7 Vital Signs Time Taken: 15:10 Temperature (F): 98.4 Height (in): 62 Pulse (bpm): 92 Weight (lbs): 170 Respiratory Rate (breaths/min): 18 Body Mass Index (BMI): 31.1 Blood Pressure (mmHg): 172/102 Reference Range: 80 - 120 mg / dl Notes 0/05/2724: BP is elevated today. Patient is instructed to follow-up with PCP regarding elevation. Electronic Signature(s) Signed: 02/12/2016 3:00:23 PM By: Elliot Gurney, RN, [name], [name],  [name] Entered By: Elliot Gurney, RN, [name], Kim on 02/11/2016 15:11:23

## 2016-02-18 ENCOUNTER — Encounter: Payer: Medicare Other | Attending: Internal Medicine | Admitting: Internal Medicine

## 2016-02-18 DIAGNOSIS — I10 Essential (primary) hypertension: Secondary | ICD-10-CM | POA: Insufficient documentation

## 2016-02-18 DIAGNOSIS — M3210 Systemic lupus erythematosus, organ or system involvement unspecified: Secondary | ICD-10-CM | POA: Diagnosis not present

## 2016-02-18 DIAGNOSIS — I87332 Chronic venous hypertension (idiopathic) with ulcer and inflammation of left lower extremity: Secondary | ICD-10-CM | POA: Insufficient documentation

## 2016-02-18 DIAGNOSIS — R600 Localized edema: Secondary | ICD-10-CM | POA: Diagnosis not present

## 2016-02-18 DIAGNOSIS — I87331 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 lower extremity: Secondary | ICD-10-CM | POA: Insufficient documentation

## 2016-02-18 DIAGNOSIS — L97811 Non-pressure chronic ulcer of other part of right lower leg limited to breakdown of skin: Secondary | ICD-10-CM | POA: Diagnosis not present

## 2016-02-18 DIAGNOSIS — Z79899 Other long term (current) drug therapy: Secondary | ICD-10-CM | POA: Insufficient documentation

## 2016-02-18 DIAGNOSIS — Z8673 Personal history of transient ischemic attack (TIA), and cerebral infarction without residual deficits: Secondary | ICD-10-CM | POA: Insufficient documentation

## 2016-02-18 DIAGNOSIS — J40 Bronchitis, not specified as acute or chronic: Secondary | ICD-10-CM | POA: Insufficient documentation

## 2016-02-19 NOTE — Progress Notes (Signed)
[name] ([id number]) Visit Report for 01/28/2016 Chief Complaint Document Details [name] Date of Service: 01/28/2016 10:00 AM Patient Name: [name] Account Number: [id number] Medical Record Treating RN: [name] [id number] Number: Other Clinician: 1955/11/11 (59 y.o. Treating [name] Date of Birth/Sex: Female) Physician/Extender: G Primary Care PATIENT, NO Physician: Referring Physician: Everette [name] in Treatment: 5 Information Obtained from: Patient Chief Complaint Patient is here for review of a fairly substantial wound on her right medial lower leg that is been present for the last week Electronic Signature(s) Signed: 02/19/2016 7:33:08 AM By: [name] Najjar MD Entered By: [name] on 01/28/2016 10:40:42 [name] ([id number]) -------------------------------------------------------------------------------- HPI Details [name] Date of Service: 01/28/2016 10:00 AM Patient Name: [name] Account Number: [id number] Medical Record Treating RN: [name] [id number] Number: Other Clinician: [birth year] (59 y.o. Treating [name] Date of Birth/Sex: Female) Physician/Extender: G Primary Care PATIENT, NO Physician: Referring Physician: Everette [name] in Treatment: 5 History of Present Illness HPI Description: 12/23/15; this is a 60 year old lady who recently relocated back to [place] from [place]. She is staying with a family friend previously lived with a daughter in [place]. 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 She is on chronic prednisone she is not a diabetic. Dose of prednisone is 10 mg. The patient tells me that roughly a week ago she developed a fairly substantial blister over this area that then ruptured. She was seen in the emergency room on 5/7 an x-ray of the leg was negative she was put on Septra although I don't  think any cultures were done. She also tells me she has had chronic edema in her legs for a number of years. She had a similar presentation to currently in the right lateral lower leg roughly a year ago that she was able to heal on her own. As noted she has systemic lupus but does not have lupus nephritis according to the patient. She has a lot of edema in her lower legs. The ABI's could not be obtained. 12/31/15; the patient's insurance which is [place] [employer] makes her out of network for any home health therefore we have been changing her dressing in our facility. I reviewed her trip to the ER on 12/21/2015 her creatinine was within the normal range hemoglobin and white count normal. Culture of the wound was negative. X-ray of the leg showed soft tissue edema no foreign bodies. We have been dressing this with Aquacel Ag due to the amount of ongoing drainage 01/07/16; the patient had her arterial studies this morning I don't have these results area she comes back in to have [place] rewrap since she doesn't have access to home health 01/14/16 I still do not have the results of her arterial studies. Our nurse correctly pointed out that we've been wrapping the left leg without an open wound I think this had to do with the fact that she had so much edema when she came in I was concerned about leaving this unwrapped. 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 We left her left leg unwrapped last week as she has no open wound here today she has extensive edema here. We did order stockings however I believe the company called but they have not called them back. She comes back in today with  extensive edema in the left leg. We have her arterial studies which showed triphasic waves and all locations tested including her posterior tibial and anterior tibial arteries bilaterally. Report listed  no hemodynamically significant bilateral lower extremity arterial stenosis. She has her venous studies later this month. I'm going to rewrap the left leg due to the extent of the edema, transitioning her into the stocking we ordered last week as soon as one is available 01/27/16 the patient arrived today with pooled serosanguineous drainage over the wound it would not absorb into her Aquacel Ag which is somewhat on. In spite of this the dimensions of her wound are down [name], [name] ([id number]) considerably and the wound bed looks healthy without any need for debridement. She is obtained her juxtalite stockings the left leg which has no open wound. We are waiting for the right leg wound to get smaller before ordering her one for the right leg Electronic Signature(s) Signed: 02/19/2016 7:33:08 AM By: [name] Najjar MD Entered By: [name] on 01/28/2016 10:42:39 [name] ([id number]) -------------------------------------------------------------------------------- Physical Exam Details [name] Date of Service: 01/28/2016 10:00 AM Patient Name: [name] Account Number: [id number] Medical Record Treating RN: [name] [id number] Number: Other Clinician: [birth year] (59 y.o. Treating An Lannan Date of Birth/Sex: Female) Physician/Extender: G Primary Care PATIENT, NO Physician: Referring Physician: Phineas Semen Weeks in Treatment: 5 Constitutional Sitting or standing Blood Pressure is within target range for patient.. Pulse regular and within target range for patient.Marland Kitchen Respirations regular, non-labored and within target range.. Temperature is normal and within the target range for the patient.. Eyes Conjunctivae clear. No discharge.Marland Kitchen Respiratory Respiratory effort is easy and symmetric bilaterally. Rate is normal at rest and on room air.. Cardiovascular Pedal pulses palpable and strong bilaterally.. Patient has considerable venous disease with  hypertension and inflammation however her edema is controlled. Lymphatic Nonpalpable in the popliteal or inguinal area. Psychiatric No evidence of depression, anxiety, or agitation. Calm, cooperative, and communicative. Appropriate interactions and affect.. Notes Wound exam; this substantial wound on the right medial leg continues to make progress. Smaller today. Advancing epithelialization. Surface of the wound did not require debridement. There is no evidence of infection Electronic Signature(s) Signed: 02/19/2016 7:33:08 AM By: [name] Najjar MD Entered By: [name] on 01/28/2016 10:44:32 Beers, [name] ([id number]) -------------------------------------------------------------------------------- Physician Orders Details [name] Date of Service: 01/28/2016 10:00 AM Patient Name: [name] Account Number: [id number] Medical Record Treating RN: [name] [id number] Number: Other Clinician: 10/08/1955 (59 y.o. Treating Zimal Weisensel Date of Birth/Sex: Female) Physician/Extender: G Primary Care PATIENT, NO Physician: Referring Physician: Everette [name] in Treatment: 5 Verbal / Phone Orders: Yes Clinician: Huel [name] Read Back and Verified: Yes Diagnosis Coding Wound Cleansing Wound #1 Right,Medial Lower Leg o Cleanse wound with mild soap and water Anesthetic Wound #1 Right,Medial Lower Leg o Topical Lidocaine 4% cream applied to wound bed prior to debridement Skin Barriers/Peri-Wound Care Wound #1 Right,Medial Lower Leg o Barrier cream Primary Wound Dressing Wound #1 Right,Medial Lower Leg o Aquacel Ag - or equivalent Secondary Dressing Wound #1 Right,Medial Lower Leg o ABD pad Dressing Change Frequency Wound #1 Right,Medial Lower Leg o Change dressing every week Follow-up Appointments Wound #1 Right,Medial Lower Leg o Return Appointment in 1 week. o Nurse Visit as needed Edema Control Wound #1 Right,Medial Lower  Leg Khokhar, [name] ([id number]) o 4 Layer Compression System - Bilateral o Elevate legs to the level of the heart and pump ankles as often as possible Additional Orders /  Instructions Wound #1 Right,Medial Lower Leg o Activity as tolerated Electronic Signature(s) Signed: 01/29/2016 11:41:21 AM By: Elliot Gurney RN, [name], [name], [name] Signed: 02/19/2016 7:33:08 AM By: [name] Najjar MD Entered By: Elliot Gurney, RN, [name], Kim on 01/28/2016 10:38:26 Knee, [name] ([id number]) -------------------------------------------------------------------------------- Problem List Details [name] Date of Service: 01/28/2016 10:00 AM Patient Name: [name] Account Number: [id number] Medical Record Treating RN: [name] [id number] Number: Other Clinician: [birth year] (59 y.o. 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 and 12/23/2015 Yes inflammation of right lower extremity I87.322 Chronic venous hypertension (idiopathic) with 12/23/2015 Yes inflammation of left lower extremity M32.10 Systemic lupus erythematosus, organ or system 12/23/2015 Yes involvement unspecified Inactive Problems Resolved Problems Electronic Signature(s) Signed: 02/19/2016 7:33:08 AM By: [name] Najjar MD Entered By: [name] on 01/28/2016 10:40:05 Cressey, [name] ([id number]) -------------------------------------------------------------------------------- Progress Note Details Gorey, [name] Date of Service: 01/28/2016 10:00 AM Patient Name: [name] Account Number: [id number] Medical Record Treating RN: [name] [id number] Number: Other Clinician: 09-12-1955 (59 y.o. Treating Taunia Frasco Date of Birth/Sex: Female) Physician/Extender:  G Primary Care PATIENT, NO Physician: Referring Physician: Everette [name] in Treatment: 5 Subjective Chief Complaint Information obtained from Patient Patient is here for review of a fairly substantial wound on her right medial lower leg that is been present for the last week History of Present Illness (HPI) 12/23/15; this is a 60 year old lady who recently relocated back to [name] from [place]. She is staying with a family friend previously lived with a daughter in [place]. 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 She is on chronic prednisone she is not a diabetic. Dose of prednisone is 10 mg. The patient tells me that roughly a week ago she developed a fairly substantial blister over this area that then ruptured. She was seen in the emergency room on 5/7 an x-ray of the leg was negative she was put on Septra although I don't think any cultures were done. She also tells me she has had chronic edema in her legs for a number of years. She had a similar presentation to currently in the right lateral lower leg roughly a year ago that she was able to heal on her own. As noted she has systemic lupus but does not have lupus nephritis according to the patient. She has a lot of edema in her lower legs. The ABI's could not be obtained. 12/31/15; the patient's insurance which is [place] [employer] makes her out of network for any home health therefore we have been changing her dressing in our facility. I reviewed her trip to the ER on 12/21/2015 her creatinine was within the normal range hemoglobin and white count normal. Culture of the wound was negative. X-ray of the leg showed soft tissue edema no foreign bodies. We have been dressing this with Aquacel Ag due to the amount of ongoing drainage 01/07/16; the patient had her arterial studies this morning I don't have these results area she comes back in to have [place] rewrap since  she doesn't have access to home health 01/14/16 I still do not have the results of her arterial studies. Our nurse correctly pointed out that we've been wrapping the left leg without an open wound I think this had to do with the fact  that she had so much edema when she came in I was concerned about leaving this unwrapped. 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 We left her left leg unwrapped last week as she has no open wound here today she has extensive edema here. We did order stockings however I believe the company called but they have not called them Dohn, [name] ([id number]) back. She comes back in today with extensive edema in the left leg. We have her arterial studies which showed triphasic waves and all locations tested including her posterior tibial and anterior tibial arteries bilaterally. Report listed no hemodynamically significant bilateral lower extremity arterial stenosis. She has her venous studies later this month. I'm going to rewrap the left leg due to the extent of the edema, transitioning her into the stocking we ordered last week as soon as one is available 01/27/16 the patient arrived today with pooled serosanguineous drainage over the wound it would not absorb into her Aquacel Ag which is somewhat on. In spite of this the dimensions of her wound are down considerably and the wound bed looks healthy without any need for debridement. She is obtained her juxtalite stockings the left leg which has no open wound. We are waiting for the right leg wound to get smaller before ordering her one for the right leg Objective Constitutional Sitting or standing Blood Pressure is within target range for patient.. Pulse regular and within target range for patient.Marland Kitchen Respirations regular, non-labored and within target range.. Temperature is normal and  within the target range for the patient.. Vitals Time Taken: 10:14 AM, Height: 62 in, Weight: 170 lbs, BMI: 31.1, Temperature: 98.2 F, Pulse: 107 bpm, Respiratory Rate: 18 breaths/min, Blood Pressure: 127/75 mmHg. Eyes Conjunctivae clear. No discharge.Marland Kitchen Respiratory Respiratory effort is easy and symmetric bilaterally. Rate is normal at rest and on room air.. Cardiovascular Pedal pulses palpable and strong bilaterally.. Patient has considerable venous disease with hypertension and inflammation however her edema is controlled. Lymphatic Nonpalpable in the popliteal or inguinal area. Psychiatric No evidence of depression, anxiety, or agitation. Calm, cooperative, and communicative. Appropriate interactions and affect.. General Notes: Wound exam; this substantial wound on the right medial leg continues to make progress. Smaller today. Advancing epithelialization. Surface of the wound did not require debridement. There is no evidence of infection Integumentary (Hair, Skin) Hritz, [name] ([id number]) Wound #1 status is Open. Original cause of wound was Blister. The wound is located on the Right,Medial Lower Leg. The wound measures 5cm length x 3.5cm width x 0.1cm depth; 13.744cm^2 area and 1.374cm^3 volume. The wound is limited to skin breakdown. There is no tunneling or undermining noted. There is a large amount of serosanguineous drainage noted. The wound margin is flat and intact. There is large (67-100%) red, friable granulation within the wound bed. There is a small (1-33%) amount of necrotic tissue within the wound bed including Adherent Slough. The periwound skin appearance exhibited: Maceration, Moist, Hemosiderin Staining. The periwound skin appearance did not exhibit: Callus, Crepitus, Excoriation, Fluctuance, Friable, Induration, Localized Edema, Rash, Scarring, Dry/Scaly, Atrophie Blanche, Cyanosis, Ecchymosis, Mottled, Pallor, Rubor, Erythema. Periwound temperature was  noted as No Abnormality. The periwound has tenderness on palpation. Assessment Active Problems ICD-10 I87.331 - 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 lower extremity I87.322 - Chronic venous hypertension (idiopathic) with inflammation of left lower extremity M32.10 - Systemic lupus  erythematosus, organ or system involvement unspecified Plan Wound Cleansing: Wound #1 Right,Medial Lower Leg: Cleanse wound with mild soap and water Anesthetic: Wound #1 Right,Medial Lower Leg: Topical Lidocaine 4% cream applied to wound bed prior to debridement Skin Barriers/Peri-Wound Care: Wound #1 Right,Medial Lower Leg: Barrier cream Primary Wound Dressing: Wound #1 Right,Medial Lower Leg: Aquacel Ag - or equivalent Secondary Dressing: Wound #1 Right,Medial Lower Leg: ABD pad Dressing Change Frequency: Wound #1 Right,Medial Lower Leg: Change dressing every week Follow-up Appointments: Wound #1 Right,Medial Lower Leg: Obando, [name] ([id number]) Return Appointment in 1 week. Nurse Visit as needed Edema Control: Wound #1 Right,Medial Lower Leg: 4 Layer Compression System - Bilateral Elevate legs to the level of the heart and pump ankles as often as possible Additional Orders / Instructions: Wound #1 Right,Medial Lower Leg: Activity as tolerated o o In spite of the lack of absorption of the drainage in the Aquacel Ag we are continuing to make good improvement therefore we have reapplied Aquacel Ag, ABDs Electronic Signature(s) Signed: 02/19/2016 7:33:08 AM By: [name] Najjar MD Entered By: [name] on 01/28/2016 10:46:07 Haag, [name] ([id number]) -------------------------------------------------------------------------------- SuperBill Details Eye, [name] Date of Service: 01/28/2016 Patient Name: [name] Account Number: [id number] Medical Record Treating RN: [name] [id number] Number: Other Clinician: May 18, 1956 (59 y.o.  Treating [name] Date of Birth/Sex: Female) Physician/Extender: G Primary Care Weeks in Treatment: 5 PATIENT, NO Physician: Referring Physician: Phineas Semen Diagnosis Coding ICD-10 Codes Code Description 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 lower I87.331 extremity I87.322 Chronic venous hypertension (idiopathic) with inflammation of left lower extremity M32.10 Systemic lupus erythematosus, organ or system involvement unspecified Facility Procedures CPT4: Description Modifier Quantity Code [id number] (Facility Use Only) [phone] - APPLY MULTLAY COMPRS LWR RT 1 LEG Physician Procedures CPT4: Description Modifier Quantity Code [id number] - WC PHYS LEVEL 3 - EST PT 1 ICD-10 Description Diagnosis I87.331 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 lower extremity Electronic Signature(s) Signed: 01/29/2016 11:41:21 AM By: Elliot Gurney, RN, [name], [name], [name] Signed: 02/19/2016 7:33:08 AM By: [name] Najjar MD Entered By: Elliot Gurney, RN, [name], Kim on 01/28/2016 10:55:07

## 2016-02-19 NOTE — Progress Notes (Signed)
[name], [name] ([id number]) Visit Report for 01/28/2016 Arrival Information Details Patient Name: [name], [name] Date of Service: 01/28/2016 10:00 AM Medical Record Patient Account Number: [id number] Number: Treating RN: Huel Coventry Aug 06, 1956 (59 y.o. Other Clinician: Date of Birth/Sex: Female) Treating ROBSON, MICHAEL Primary Care Physician: PATIENT, NO Physician/Extender: G Referring Physician: Everette Rank in Treatment: 5 Visit Information History Since Last Visit Added or deleted any medications: No Patient Arrived: Ambulatory Any new allergies or adverse reactions: No Arrival Time: 10:14 Had a fall or experienced change in No Accompanied By: caregiver activities of daily living that may affect Transfer Assistance: None risk of falls: Patient Identification Verified: Yes Signs or symptoms of abuse/neglect since last No Secondary Verification Process Yes visito Completed: Hospitalized since last visit: No Patient Requires Transmission-Based No Has Dressing in Place as Prescribed: Yes Precautions: Has Compression in Place as Prescribed: Yes Patient Has Alerts: No Pain Present Now: No Electronic Signature(s) Signed: 01/29/2016 11:41:21 AM By: Elliot Gurney, RN, [name], [name], [name] Entered By: Elliot Gurney, RN, [name], Kim on 01/28/2016 10:14:33 Holte, [name] ([id number]) -------------------------------------------------------------------------------- Encounter Discharge Information Details Patient Name: [name], [name] Date of Service: 01/28/2016 10:00 AM Medical Record Patient Account Number: [id number] Number: Treating RN: Huel Coventry 09-25-1955 (59 y.o. Other Clinician: Date of Birth/Sex: Female) Treating ROBSON, MICHAEL Primary Care Physician: PATIENT, NO Physician/Extender: G Referring Physician: Everette Rank in Treatment: 5 Encounter Discharge Information Items Discharge Pain Level: 0 Discharge Condition: Stable Ambulatory  Status: Ambulatory Discharge Destination: Home Private Transportation: Auto Accompanied By: caregiver Schedule Follow-up Appointment: Yes Medication Reconciliation completed and Yes provided to Patient/Care Provider: Clinical Summary of Care: Electronic Signature(s) Signed: 01/29/2016 11:41:21 AM By: Elliot Gurney, RN, [name], [name], [name] Entered By: Elliot Gurney, RN, [name], Kim on 01/28/2016 10:56:04 Nanni, [name] ([id number]) -------------------------------------------------------------------------------- Lower Extremity Assessment Details Patient Name: [name], [name] Date of Service: 01/28/2016 10:00 AM Medical Record Patient Account Number: [id number] Number: Treating RN: Huel Coventry April 23, 1956 (59 y.o. Other Clinician: Date of Birth/Sex: Female) Treating ROBSON, MICHAEL Primary Care Physician: PATIENT, NO Physician/Extender: G Referring Physician: Everette Rank in Treatment: 5 Edema Assessment Assessed: [Left: No] [Right: No] E[Left: dema] [Right: :] Calf Left: Right: Point of Measurement: 31 cm From Medial Instep cm 39 cm Ankle Left: Right: Point of Measurement: 11 cm From Medial Instep cm 25.5 cm Vascular Assessment Pulses: Posterior Tibial Dorsalis Pedis Palpable: [Right:Yes] Extremity colors, hair growth, and conditions: Extremity Color: [Right:Hyperpigmented] Hair Growth on Extremity: [Right:No] Temperature of Extremity: [Right:Cold] Capillary Refill: [Right:< 3 seconds] Toe Nail Assessment Left: Right: Thick: No Discolored: No Deformed: No Improper Length and Hygiene: No Electronic Signature(s) Signed: 01/29/2016 11:41:21 AM By: Elliot Gurney, RN, [name], [name], [name] Entered By: Elliot Gurney, RN, [name], Kim on 01/28/2016 10:25:16 Yagi, [name] ([id number]) Claudio, Annita Elbert [name] ([id number]) -------------------------------------------------------------------------------- Multi Wound Chart Details Patient Name: [name], [name] Date of Service: 01/28/2016  10:00 AM Medical Record Patient Account Number: [id number] Number: Treating RN: Huel Coventry 1956/03/20 (59 y.o. Other Clinician: Date of Birth/Sex: Female) Treating ROBSON, MICHAEL Primary Care Physician: PATIENT, NO Physician/Extender: G Referring Physician: Everette Rank in Treatment: 5 Vital Signs Height(in): 62 Pulse(bpm): 107 Weight(lbs): 170 Blood Pressure 127/75 (mmHg): Body Mass Index(BMI): 31 Temperature(F): 98.2 Respiratory Rate 18 (breaths/min): Photos: [1:No Photos] [N/A:N/A] Wound Location: [1:Right Lower Leg - Medial] [N/A:N/A] Wounding Event: [1:Blister] [N/A:N/A] Primary Etiology: [1:Venous Leg Ulcer] [N/A:N/A] Date Acquired: [1:12/15/2015] [N/A:N/A] Weeks of Treatment: [1:5] [N/A:N/A] Wound Status: [1:Open] [N/A:N/A] Measurements L x W x D 5x3.5x0.1 [  N/A:N/A] (cm) Area (cm) : [1:13.744] [N/A:N/A] Volume (cm) : [1:1.374] [N/A:N/A] % Reduction in Area: [1:47.00%] [N/A:N/A] % Reduction in Volume: 47.00% [N/A:N/A] Classification: [1:Partial Thickness] [N/A:N/A] Exudate Amount: [1:Large] [N/A:N/A] Exudate Type: [1:Serosanguineous] [N/A:N/A] Exudate Color: [1:red, brown] [N/A:N/A] Wound Margin: [1:Flat and Intact] [N/A:N/A] Granulation Amount: [1:Large (67-100%)] [N/A:N/A] Granulation Quality: [1:Red, Hyper-granulation, Friable] [N/A:N/A] Necrotic Amount: [1:Small (1-33%)] [N/A:N/A] Exposed Structures: [1:Fascia: No Fat: No Tendon: No Muscle: No Joint: No Bone: No] [N/A:N/A] Limited to Skin Breakdown Epithelialization: None N/A N/A Periwound Skin Texture: Edema: No N/A N/A Excoriation: No Induration: No Callus: No Crepitus: No Fluctuance: No Friable: No Rash: No Scarring: No Periwound Skin Maceration: Yes N/A N/A Moisture: Moist: Yes Dry/Scaly: No Periwound Skin Color: Hemosiderin Staining: Yes N/A N/A Atrophie Blanche: No Cyanosis: No Ecchymosis: No Erythema: No Mottled: No Pallor: No Rubor: No Temperature: No  Abnormality N/A N/A Tenderness on Yes N/A N/A Palpation: Wound Preparation: Ulcer Cleansing: Other: N/A N/A soap and water Topical Anesthetic Applied: Other: lidocaine 4% Treatment Notes Electronic Signature(s) Signed: 01/29/2016 11:41:21 AM By: Elliot Gurney, RN, [name], [name], [name] Entered By: Elliot Gurney, RN, [name], Kim on 01/28/2016 10:27:49 [name], [name] ([id number]) -------------------------------------------------------------------------------- Multi-Disciplinary Care Plan Details Patient Name: [name], [name] Date of Service: 01/28/2016 10:00 AM Medical Record Patient Account Number: [id number] Number: Treating RN: Huel Coventry 09-06-1955 (59 y.o. Other Clinician: Date of Birth/Sex: Female) Treating ROBSON, MICHAEL Primary Care Physician: PATIENT, NO Physician/Extender: G Referring Physician: Everette Rank in Treatment: 5 Active Inactive Abuse / Safety / Falls / Self Care Management Nursing Diagnoses: Impaired physical mobility Potential for falls Goals: Patient will remain injury free Date Initiated: 12/23/2015 Goal Status: Active Interventions: Assess fall risk on admission and as needed Notes: Nutrition Nursing Diagnoses: Imbalanced nutrition Goals: Patient/caregiver verbalizes understanding of need to maintain therapeutic glucose control per primary care physician Date Initiated: 12/23/2015 Goal Status: Active Interventions: Provide education on nutrition Treatment Activities: Education provided on Nutrition : 01/21/2016 Notes: Orientation to the Wound Care Program [name], [name] ([id number]) Nursing Diagnoses: Knowledge deficit related to the wound healing center program Goals: Patient/caregiver will verbalize understanding of the Wound Healing Center Program Date Initiated: 12/23/2015 Goal Status: Active Interventions: Provide education on orientation to the wound center Notes: Venous Leg Ulcer Nursing Diagnoses: Potential for venous  Insuffiency (use before diagnosis confirmed) Goals: Patient will maintain optimal edema control Date Initiated: 12/23/2015 Goal Status: Active Interventions: Provide education on venous insufficiency Notes: Wound/Skin Impairment Nursing Diagnoses: Impaired tissue integrity Goals: Ulcer/skin breakdown will heal within 14 weeks Date Initiated: 12/23/2015 Goal Status: Active Interventions: Assess ulceration(s) every visit Notes: Electronic Signature(s) Signed: 01/29/2016 11:41:21 AM By: Elliot Gurney, RN, [name], [name], [name] Entered By: Elliot Gurney, RN, [name], Kim on 01/28/2016 10:27:41 [name], [name] ([id number]) [name], [name] ([id number]) -------------------------------------------------------------------------------- Pain Assessment Details Patient Name: [name], [name] Date of Service: 01/28/2016 10:00 AM Medical Record Patient Account Number: [id number] Number: Treating RN: Huel Coventry July 02, 1956 (59 y.o. Other Clinician: Date of Birth/Sex: Female) Treating ROBSON, MICHAEL Primary Care Physician: PATIENT, NO Physician/Extender: G Referring Physician: Everette Rank in Treatment: 5 Active Problems Location of Pain Severity and Description of Pain Patient Has Paino No Site Locations With Dressing Change: No Pain Management and Medication Current Pain Management: Electronic Signature(s) Signed: 01/29/2016 11:41:21 AM By: Elliot Gurney, RN, [name], [name], [name] Entered By: Elliot Gurney, RN, [name], Kim on 01/28/2016 10:14:39 [name], [name] ([id number]) -------------------------------------------------------------------------------- Wound Assessment Details Patient Name: Iovine, Lashica L. Date of Service: 01/28/2016 10:00 AM Medical Record Patient Account  Number: [id number] Number: Treating RN: Huel Coventry 1955-09-05 (59 y.o. Other Clinician: Date of Birth/Sex: Female) Treating ROBSON, MICHAEL Primary Care Physician: PATIENT, NO Physician/Extender: G Referring  Physician: Everette Rank in Treatment: 5 Wound Status Wound Number: 1 Primary Etiology: Venous Leg Ulcer Wound Location: Right Lower Leg - Medial Wound Status: Open Wounding Event: Blister Date Acquired: 12/15/2015 Weeks Of Treatment: 5 Clustered Wound: No Photos Wound Measurements Length: (cm) 5 Width: (cm) 3.5 Depth: (cm) 0.1 Area: (cm) 13.744 Volume: (cm) 1.374 % Reduction in Area: 47% % Reduction in Volume: 47% Epithelialization: None Tunneling: No Undermining: No Wound Description Classification: Partial Thickness Wound Margin: Flat and Intact Exudate Amount: Large Exudate Type: Serosanguineous Exudate Color: red, brown Foul Odor After Cleansing: No Wound Bed Granulation Amount: Large (67-100%) Exposed Structure Granulation Quality: Red, Hyper-granulation, Friable Fascia Exposed: No Necrotic Amount: Small (1-33%) Fat Layer Exposed: No Micke, Jullie L. ([id number]) Necrotic Quality: Adherent Slough Tendon Exposed: No Muscle Exposed: No Joint Exposed: No Bone Exposed: No Limited to Skin Breakdown Periwound Skin Texture Texture Color No Abnormalities Noted: No No Abnormalities Noted: No Callus: No Atrophie Blanche: No Crepitus: No Cyanosis: No Excoriation: No Ecchymosis: No Fluctuance: No Erythema: No Friable: No Hemosiderin Staining: Yes Induration: No Mottled: No Localized Edema: No Pallor: No Rash: No Rubor: No Scarring: No Temperature / Pain Moisture Temperature: No Abnormality No Abnormalities Noted: No Tenderness on Palpation: Yes Dry / Scaly: No Maceration: Yes Moist: Yes Wound Preparation Ulcer Cleansing: Other: soap and water, Topical Anesthetic Applied: Other: lidocaine 4%, Electronic Signature(s) Signed: 01/29/2016 11:41:21 AM By: Elliot Gurney, RN, [name], [name], [name] Entered By: Elliot Gurney, RN, [name], Kim on 01/28/2016 10:34:49 Waldron, [name]  ([id number]) -------------------------------------------------------------------------------- Vitals Details Patient Name: Sachs, Jamia L. Date of Service: 01/28/2016 10:00 AM Medical Record Patient Account Number: [id number] Number: Treating RN: Huel Coventry 08/20/1955 (59 y.o. Other Clinician: Date of Birth/Sex: Female) Treating ROBSON, MICHAEL Primary Care Physician: PATIENT, NO Physician/Extender: G Referring Physician: Everette Rank in Treatment: 5 Vital Signs Time Taken: 10:14 Temperature (F): 98.2 Height (in): 62 Pulse (bpm): 107 Weight (lbs): 170 Respiratory Rate (breaths/min): 18 Body Mass Index (BMI): 31.1 Blood Pressure (mmHg): 127/75 Reference Range: 80 - 120 mg / dl Electronic Signature(s) Signed: 01/29/2016 11:41:21 AM By: Elliot Gurney, RN, [name], [name], [name] Entered By: Elliot Gurney, RN, [name], Kim on 01/28/2016 10:14:55

## 2016-02-20 NOTE — Progress Notes (Signed)
[name] ([id number]) Visit Report for 02/18/2016 Chief Complaint Document Details [name], [name] Date of Service: 02/18/2016 12:45 PM Patient Name: [name] Account Number: [id number] Medical Record Treating RN: Huel Coventry [id number] Number: Other Clinician: July 17, 1956 (59 y.o. Treating ROBSON, MICHAEL Date of Birth/Sex: Female) Physician/Extender: G Primary Care PATIENT, NO Physician: Referring Physician: Everette Rank in Treatment: 8 Information Obtained from: Patient Chief Complaint Patient is here for review of a fairly substantial wound on her right medial lower leg that is been present for the last week Electronic Signature(s) Signed: 02/19/2016 7:30:59 AM By: [name] Entered By: Baltazar Najjar on 02/18/2016 13:17:25 [name], [name] ([id number]) -------------------------------------------------------------------------------- HPI Details [name] Date of Service: 02/18/2016 12:45 PM Patient Name: [name] Account Number: [id number] Medical Record Treating RN: Huel Coventry [id number] Number: Other Clinician: 1955-11-22 (59 y.o. Treating ROBSON, MICHAEL Date of Birth/Sex: Female) Physician/Extender: G Primary Care PATIENT, NO Physician: Referring Physician: Everette Rank in Treatment: 8 History of Present Illness HPI Description: 12/23/15; this is a 60 year old lady who recently relocated back to [place] from [place]. She is staying with a family friend previously lived with a daughter in [place]. 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 She is on chronic prednisone she is not a diabetic. Dose of prednisone is 10 mg. The patient tells me that roughly a week ago she developed a fairly substantial blister over this area that then ruptured. She was seen in the emergency room on 5/7 an x-ray of the leg was negative she was put on Septra although I don't think  any cultures were done. She also tells me she has had chronic edema in her legs for a number of years. She had a similar presentation to currently in the right lateral lower leg roughly a year ago that she was able to heal on her own. As noted she has systemic lupus but does not have lupus nephritis according to the patient. She has a lot of edema in her lower legs. The ABI's could not be obtained. 12/31/15; the patient's insurance which is [place] [employer] makes her out of network for any home health therefore we have been changing her dressing in our facility. I reviewed her trip to the ER on 12/21/2015 her creatinine was within the normal range hemoglobin and white count normal. Culture of the wound was negative. X-ray of the leg showed soft tissue edema no foreign bodies. We have been dressing this with Aquacel Ag due to the amount of ongoing drainage 01/07/16; the patient had her arterial studies this morning I don't have these results area she comes back in to have [place] rewrap since she doesn't have access to home health 01/14/16 I still do not have the results of her arterial studies. Our nurse correctly pointed out that we've been wrapping the left leg without an open wound I think this had to do with the fact that she had so much edema when she came in I was concerned about leaving this unwrapped. 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 We left her left leg unwrapped last week as she has no open wound here today she has extensive edema here. We did order stockings however I believe the company called but they have not called them back. She comes back in today with  extensive edema in the left leg. We have her arterial studies which showed triphasic waves and all locations tested including her posterior tibial and anterior tibial arteries bilaterally. Report listed no  hemodynamically significant bilateral lower extremity arterial stenosis. She has her venous studies later this month. I'm going to rewrap the left leg due to the extent of the edema, transitioning her into the stocking we ordered last week as soon as one is available 01/27/16 the patient arrived today with pooled serosanguineous drainage over the wound it would not absorb into her Aquacel Ag which is somewhat on. In spite of this the dimensions of her wound are down [name], [name] ([id number]) considerably and the wound bed looks healthy without any need for debridement. She is obtained her juxtalite stockings the left leg which has no open wound. We are waiting for the right leg wound to get smaller before ordering her one for the right leg 02/03/16; patient wound looked improved. No debridement was required. We've been using the juxtalite stocking on the left leg which is no open wound. 02/11/16 wound is remarkably better. No debridement was required. Healthy rim of epithelialization. We have been using Aquacel Ag, we'll try to close this down with RTD over the next 2 weeks 02/18/16 wound continues to improve down a centimeter in length and width. No debridement was required. Advancing epithelialization. We started RTD last week Electronic Signature(s) Signed: 02/19/2016 7:30:59 AM By: [name] Entered By: Baltazar Najjar on 02/18/2016 13:18:23 [name] ([id number]) -------------------------------------------------------------------------------- Physical Exam Details [name] Date of Service: 02/18/2016 12:45 PM Patient Name: [name] Account Number: [id number] Medical Record Treating RN: Huel Coventry [id number] Number: Other Clinician: 04/02/1956 (59 y.o. Treating ROBSON, MICHAEL Date of Birth/Sex: Female) Physician/Extender: G Primary Care PATIENT, NO Physician: Referring Physician: Everette Rank in Treatment: 8 Constitutional Patient is  hypertensive.. Pulse regular and within target range for patient.Marland Kitchen Respirations regular, non-labored and within target range.. Temperature is normal and within the target range for the patient.Marland Kitchen Respiratory Respiratory effort is easy and symmetric bilaterally. Rate is normal at rest and on room air.. Cardiovascular Pedal pulses palpable and strong bilaterally.. Edema present in both extremities. No doubt the patient has venous insufficiency however her edema is well controlled. Lymphatic None palpable in the popliteal or inguinal area. Integumentary (Hair, Skin) The tissue around her substantial right medial leg wound is without pain warmth tenderness or crepitus. Psychiatric No evidence of depression, anxiety, or agitation. Calm, cooperative, and communicative. Appropriate interactions and affect.. Notes Wound exam; the original substantial wound on the right medial leg continues to make good progress. Healthy epithelialization around the entire circumference of the wound and healthy appearing granulation that does not require any debridement. There is no evidence of soft tissue infection Electronic Signature(s) Signed: 02/19/2016 7:30:59 AM By: [name] Entered By: Baltazar Najjar on 02/18/2016 13:20:47 [name], [name] ([id number]) -------------------------------------------------------------------------------- Physician Orders Details [name] Date of Service: 02/18/2016 12:45 PM Patient Name: [name] Account Number: [id number] Medical Record Treating RN: Huel Coventry [id number] Number: Other Clinician: 10-08-1955 (59 y.o. Treating ROBSON, MICHAEL Date of Birth/Sex: Female) Physician/Extender: G Primary Care PATIENT, NO Physician: Referring Physician: Everette Rank in Treatment: 8 Verbal / Phone Orders: Yes Clinician: Huel Coventry Read Back and Verified: Yes Diagnosis Coding Wound Cleansing Wound #1 Right,Medial Lower Leg o Cleanse wound with  mild soap and water Anesthetic Wound #1 Right,Medial Lower Leg o Topical Lidocaine 4% cream applied to wound bed prior to debridement Skin  Barriers/Peri-Wound Care Wound #1 Right,Medial Lower Leg o Barrier cream Primary Wound Dressing Wound #1 Right,Medial Lower Leg o Other: - RTD Secondary Dressing Wound #1 Right,Medial Lower Leg o ABD pad Dressing Change Frequency Wound #1 Right,Medial Lower Leg o Change dressing every week Follow-up Appointments Wound #1 Right,Medial Lower Leg o Return Appointment in 1 week. o Nurse Visit as needed Edema Control Wound #1 Right,Medial Lower Leg Kotlyar, [name] ([id number]) o 4 Layer Compression System - Bilateral o Elevate legs to the level of the heart and pump ankles as often as possible Additional Orders / Instructions Wound #1 Right,Medial Lower Leg o Activity as tolerated Electronic Signature(s) Signed: 02/19/2016 7:30:59 AM By: [name] Signed: 02/19/2016 4:09:14 PM By: Elliot Gurney RN, [name], [name], [name] Entered By: Elliot Gurney, RN, [name], Kim on 02/18/2016 13:09:13 [name], [name] ([id number]) -------------------------------------------------------------------------------- Problem List Details [name] Date of Service: 02/18/2016 12:45 PM Patient Name: [name] Account Number: [id number] Medical Record Treating RN: Huel Coventry [id number] Number: Other Clinician: [birth year] (59 y.o. 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/23/2015 Yes inflammation of right lower extremity I87.322 Chronic venous hypertension (idiopathic) with 12/23/2015 Yes inflammation of left lower extremity M32.10 Systemic lupus erythematosus, organ or system 12/23/2015  Yes involvement unspecified Inactive Problems Resolved Problems Electronic Signature(s) Signed: 02/19/2016 7:30:59 AM By: [name] Entered By: Baltazar Najjar on 02/18/2016 13:17:00 [name], [name] ([id number]) -------------------------------------------------------------------------------- Progress Note Details [name] Date of Service: 02/18/2016 12:45 PM Patient Name: [name] Account Number: [id number] Medical Record Treating RN: Huel Coventry [id number] Number: Other Clinician: 08-24-1955 (59 y.o. Treating ROBSON, MICHAEL Date of Birth/Sex: Female) Physician/Extender: G Primary Care PATIENT, NO Physician: Referring Physician: Everette Rank in Treatment: 8 Subjective Chief Complaint Information obtained from Patient Patient is here for review of a fairly substantial wound on her right medial lower leg that is been present for the last week History of Present Illness (HPI) 12/23/15; this is a 60 year old lady who recently relocated back to [place] from [place]. She is staying with a family friend previously lived with a daughter in [place]. 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 She is on chronic prednisone she is not a diabetic. Dose of prednisone is 10 mg. The patient tells me that roughly a week ago she developed a fairly substantial blister over this area that then ruptured. She was seen in the emergency room on 5/7 an x-ray of the leg was negative she was put on Septra although I don't think any cultures were done. She also tells me she has had chronic edema in her legs for a number of years. She had a similar presentation to currently in the right lateral lower leg roughly a year ago that she was able to heal on her own. As noted she has systemic lupus but does not have lupus nephritis according to the patient. She has a lot of edema in her lower legs. The ABI's could not be  obtained. 12/31/15; the patient's insurance which is [place] [employer] makes her out of network for any home health therefore we have been changing her dressing in our facility. I reviewed her trip to the ER on 12/21/2015 her creatinine was within the normal range hemoglobin and white count normal.  Culture of the wound was negative. X-ray of the leg showed soft tissue edema no foreign bodies. We have been dressing this with Aquacel Ag due to the amount of ongoing drainage 01/07/16; the patient had her arterial studies this morning I don't have these results area she comes back in to have [place] rewrap since she doesn't have access to home health 01/14/16 I still do not have the results of her arterial studies. Our nurse correctly pointed out that we've been wrapping the left leg without an open wound I think this had to do with the fact that she had so much edema when she came in I was concerned about leaving this unwrapped. 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 We left her left leg unwrapped last week as she has no open wound here today she has extensive edema here. We did order stockings however I believe the company called but they have not called them [name], [name] ([id number]) back. She comes back in today with extensive edema in the left leg. We have her arterial studies which showed triphasic waves and all locations tested including her posterior tibial and anterior tibial arteries bilaterally. Report listed no hemodynamically significant bilateral lower extremity arterial stenosis. She has her venous studies later this month. I'm going to rewrap the left leg due to the extent of the edema, transitioning her into the stocking we ordered last week as soon as one is available 01/27/16 the patient arrived today with pooled serosanguineous drainage over the wound it would  not absorb into her Aquacel Ag which is somewhat on. In spite of this the dimensions of her wound are down considerably and the wound bed looks healthy without any need for debridement. She is obtained her juxtalite stockings the left leg which has no open wound. We are waiting for the right leg wound to get smaller before ordering her one for the right leg 02/03/16; patient wound looked improved. No debridement was required. We've been using the juxtalite stocking on the left leg which is no open wound. 02/11/16 wound is remarkably better. No debridement was required. Healthy rim of epithelialization. We have been using Aquacel Ag, we'll try to close this down with RTD over the next 2 weeks 02/18/16 wound continues to improve down a centimeter in length and width. No debridement was required. Advancing epithelialization. We started RTD last week Objective Constitutional Patient is hypertensive.. Pulse regular and within target range for patient.Marland Kitchen Respirations regular, non-labored and within target range.. Temperature is normal and within the target range for the patient.. Vitals Time Taken: 12:47 PM, Height: 62 in, Weight: 170 lbs, BMI: 31.1, Temperature: 98.3 F, Pulse: 89 bpm, Respiratory Rate: 18 breaths/min, Blood Pressure: 151/96 mmHg. Respiratory Respiratory effort is easy and symmetric bilaterally. Rate is normal at rest and on room air.. Cardiovascular Pedal pulses palpable and strong bilaterally.. Edema present in both extremities. No doubt the patient has venous insufficiency however her edema is well controlled. Lymphatic None palpable in the popliteal or inguinal area. Psychiatric No evidence of depression, anxiety, or agitation. Calm, cooperative, and communicative. Appropriate interactions and affect.. General Notes: Wound exam; the original substantial wound on the right medial leg continues to make good progress. Healthy epithelialization around the entire circumference of  the wound and healthy appearing granulation that does not require any debridement. There is no evidence of soft tissue infection  [name], [name] ([id number]) Integumentary (Hair, Skin) The tissue around her substantial right medial leg wound is without pain warmth tenderness or crepitus. Wound #1 status is Open. Original cause of wound was Blister. The wound is located on the Right,Medial Lower Leg. The wound measures 2cm length x 1.2cm width x 0.1cm depth; 1.885cm^2 area and 0.188cm^3 volume. The wound is limited to skin breakdown. There is no tunneling noted. There is a large amount of serosanguineous drainage noted. The wound margin is flat and intact. There is large (67-100%) red, friable granulation within the wound bed. There is no necrotic tissue within the wound bed. The periwound skin appearance exhibited: Moist, Hemosiderin Staining. The periwound skin appearance did not exhibit: Callus, Crepitus, Excoriation, Fluctuance, Friable, Induration, Localized Edema, Rash, Scarring, Dry/Scaly, Maceration, Atrophie Blanche, Cyanosis, Ecchymosis, Mottled, Pallor, Rubor, Erythema. Periwound temperature was noted as No Abnormality. The periwound has tenderness on palpation. Assessment Active Problems ICD-10 I87.331 - 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 lower extremity I87.322 - Chronic venous hypertension (idiopathic) with inflammation of left lower extremity M32.10 - Systemic lupus erythematosus, organ or system involvement unspecified Plan Wound Cleansing: Wound #1 Right,Medial Lower Leg: Cleanse wound with mild soap and water Anesthetic: Wound #1 Right,Medial Lower Leg: Topical Lidocaine 4% cream applied to wound bed prior to debridement Skin Barriers/Peri-Wound Care: Wound #1 Right,Medial Lower Leg: Barrier cream Primary Wound Dressing: Wound #1 Right,Medial Lower Leg: Other: - RTD Secondary Dressing: Wound #1 Right,Medial Lower  Leg: ABD pad Dressing Change Frequency: Wound #1 Right,Medial Lower Leg: [name], [name] ([id number]) Change dressing every week Follow-up Appointments: Wound #1 Right,Medial Lower Leg: Return Appointment in 1 week. Nurse Visit as needed Edema Control: Wound #1 Right,Medial Lower Leg: 4 Layer Compression System - Bilateral Elevate legs to the level of the heart and pump ankles as often as possible Additional Orders / Instructions: Wound #1 Right,Medial Lower Leg: Activity as tolerated #1 we will continue with the RTD, with Profore compression #2 the patient is wearing a juxtalite on her left leg, has a juxtalite for the right leg when we finally get closure here. Electronic Signature(s) Signed: 02/19/2016 7:30:59 AM By: [name] Entered By: Baltazar Najjar on 02/18/2016 13:21:28 [name] ([id number]) -------------------------------------------------------------------------------- SuperBill Details [name] Date of Service: 02/18/2016 Patient Name: [name] Account Number: [id number] Medical Record Treating RN: Huel Coventry [id number] Number: Other Clinician: 1955-08-28 (59 y.o. Treating ROBSON, MICHAEL Date of Birth/Sex: Female) Physician/Extender: G Primary Care Weeks in Treatment: 8 PATIENT, NO Physician: Referring Physician: Phineas Semen Diagnosis Coding ICD-10 Codes Code Description 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 lower I87.331 extremity I87.322 Chronic venous hypertension (idiopathic) with inflammation of left lower extremity M32.10 Systemic lupus erythematosus, organ or system involvement unspecified Facility Procedures CPT4: Description Modifier Quantity Code [id number] (Facility Use Only) [phone] - APPLY MULTLAY COMPRS LWR RT 1 LEG Physician Procedures CPT4: Description Modifier Quantity Code [id number] - WC PHYS LEVEL 3 - EST PT 1 ICD-10 Description Diagnosis I87.331 Chronic venous  hypertension (idiopathic) with ulcer and inflammation of right lower extremity Electronic Signature(s) Signed: 02/19/2016 7:30:59 AM By: [name] Entered By: Baltazar Najjar on 02/18/2016 13:22:09

## 2016-02-20 NOTE — Progress Notes (Signed)
SHILEE, SCHLEIGER ([id number]) Visit Report for 02/18/2016 Arrival Information Details Patient Name: [name], [name] Date of Service: 02/18/2016 12:45 PM Medical Record Patient Account Number: [id number] Number: Treating RN: Huel Coventry November 26, 1955 (60 y.o. Other Clinician: Date of Birth/Sex: Female) Treating ROBSON, MICHAEL Primary Care Physician: PATIENT, NO Physician/Extender: G Referring Physician: Everette Rank in Treatment: 8 Visit Information History Since Last Visit Added or deleted any medications: No Patient Arrived: Ambulatory Had a fall or experienced change in No Arrival Time: 12:47 activities of daily living that may affect Accompanied By: son risk of falls: Transfer Assistance: None Signs or symptoms of abuse/neglect since last No Patient Identification Verified: Yes visito Secondary Verification Process Yes Hospitalized since last visit: No Completed: Has Dressing in Place as Prescribed: Yes Patient Requires Transmission-Based No Has Compression in Place as Prescribed: Yes Precautions: Pain Present Now: No Patient Has Alerts: No Electronic Signature(s) Signed: 02/19/2016 4:09:14 PM By: Elliot Gurney, RN, [name], [name], [name] Entered By: Elliot Gurney, RN, [name], Kim on 02/18/2016 12:47:35 Kagan, [name] ([id number]) -------------------------------------------------------------------------------- Encounter Discharge Information Details Patient Name: [name], [name] Date of Service: 02/18/2016 12:45 PM Medical Record Patient Account Number: [id number] Number: Treating RN: Huel Coventry 10/25/1955 (60 y.o. Other Clinician: Date of Birth/Sex: Female) Treating ROBSON, MICHAEL Primary Care Physician: PATIENT, NO Physician/Extender: G Referring Physician: Everette Rank in Treatment: 8 Encounter Discharge Information Items Discharge Pain Level: 0 Discharge Condition: Stable Ambulatory Status: Ambulatory Discharge Destination:  Home Transportation: Private Auto self, son in Accompanied By: lobby Schedule Follow-up Appointment: Yes Medication Reconciliation completed and provided to Patient/Care Yes Claud Gowan: Provided on Clinical Summary of Care: 02/18/2016 Form Type Recipient Paper Patient GC Electronic Signature(s) Signed: 02/18/2016 1:28:11 PM By: Gwenlyn Perking Entered By: Gwenlyn Perking on 02/18/2016 13:28:11 Springer, [name] ([id number]) -------------------------------------------------------------------------------- Lower Extremity Assessment Details Patient Name: [name], [name] Date of Service: 02/18/2016 12:45 PM Medical Record Patient Account Number: [id number] Number: Treating RN: Huel Coventry 09-25-1955 (60 y.o. Other Clinician: Date of Birth/Sex: Female) Treating ROBSON, MICHAEL Primary Care Physician: PATIENT, NO Physician/Extender: G Referring Physician: Everette Rank in Treatment: 8 Edema Assessment Assessed: [Left: No] [Right: No] E[Left: dema] [Right: :] Calf Left: Right: Point of Measurement: 31 cm From Medial Instep cm 39.5 cm Ankle Left: Right: Point of Measurement: 11 cm From Medial Instep cm 24 cm Vascular Assessment Pulses: Posterior Tibial Dorsalis Pedis Palpable: [Right:Yes] Extremity colors, hair growth, and conditions: Extremity Color: [Right:Mottled] Hair Growth on Extremity: [Right:No] Temperature of Extremity: [Right:Warm] Capillary Refill: [Right:< 3 seconds] Toe Nail Assessment Left: Right: Thick: Yes Discolored: No Deformed: No Electronic Signature(s) Signed: 02/19/2016 4:09:14 PM By: Elliot Gurney, RN, [name], [name], [name] Entered By: Elliot Gurney, RN, [name], Kim on 02/18/2016 12:55:44 Bouvier, [name] ([id number]) Schreur, Alanis Elbert Ewings ([id number]) -------------------------------------------------------------------------------- Multi Wound Chart Details Patient Name: [name], [name] Date of Service: 02/18/2016 12:45 PM Medical Record Patient Account  Number: [id number] Number: Treating RN: Huel Coventry 11-21-1955 (60 y.o. Other Clinician: Date of Birth/Sex: Female) Treating ROBSON, MICHAEL Primary Care Physician: PATIENT, NO Physician/Extender: G Referring Physician: Everette Rank in Treatment: 8 Vital Signs Height(in): 62 Pulse(bpm): 89 Weight(lbs): 170 Blood Pressure 151/96 (mmHg): Body Mass Index(BMI): 31 Temperature(F): 98.3 Respiratory Rate 18 (breaths/min): Photos: [N/A:N/A] Wound Location: Right Lower Leg - Medial N/A N/A Wounding Event: Blister N/A N/A Primary Etiology: Venous Leg Ulcer N/A N/A Date Acquired: 12/15/2015 N/A N/A Weeks of Treatment: 8 N/A N/A Wound Status: Open N/A N/A Measurements L x W x D 3x1.2x0.1 N/A N/A (  cm) Area (cm) : 2.827 N/A N/A Volume (cm) : 0.283 N/A N/A % Reduction in Area: 89.10% N/A N/A % Reduction in Volume: 89.10% N/A N/A Classification: Partial Thickness N/A N/A Exudate Amount: Large N/A N/A Exudate Type: Serosanguineous N/A N/A Exudate Color: red, brown N/A N/A Wound Margin: Flat and Intact N/A N/A Granulation Amount: Large (67-100%) N/A N/A Granulation Quality: Red, Friable N/A N/A Necrotic Amount: None Present (0%) N/A N/A Exposed Structures: N/A N/A MYKEL, MOHL ([id number]) Fascia: No Fat: No Tendon: No Muscle: No Joint: No Bone: No Limited to Skin Breakdown Epithelialization: None N/A N/A Periwound Skin Texture: Edema: No N/A N/A Excoriation: No Induration: No Callus: No Crepitus: No Fluctuance: No Friable: No Rash: No Scarring: No Periwound Skin Moist: Yes N/A N/A Moisture: Maceration: No Dry/Scaly: No Periwound Skin Color: Hemosiderin Staining: Yes N/A N/A Atrophie Blanche: No Cyanosis: No Ecchymosis: No Erythema: No Mottled: No Pallor: No Rubor: No Temperature: No Abnormality N/A N/A Tenderness on Yes N/A N/A Palpation: Wound Preparation: Ulcer Cleansing: Other: N/A N/A soap and water Topical  Anesthetic Applied: Other: lidocaine 4% Treatment Notes Electronic Signature(s) Signed: 02/19/2016 4:09:14 PM By: Elliot Gurney, RN, [name], [name], [name] Entered By: Elliot Gurney, RN, [name], Kim on 02/18/2016 12:57:17 Sharps, [name] ([id number]) -------------------------------------------------------------------------------- Multi-Disciplinary Care Plan Details Patient Name: Leveille, Cing L. Date of Service: 02/18/2016 12:45 PM Medical Record Patient Account Number: [id number] Number: Treating RN: Huel Coventry 1956/05/17 (59 y.o. Other Clinician: Date of Birth/Sex: Female) Treating ROBSON, MICHAEL Primary Care Physician: PATIENT, NO Physician/Extender: G Referring Physician: Everette Rank in Treatment: 8 Active Inactive Abuse / Safety / Falls / Self Care Management Nursing Diagnoses: Impaired physical mobility Potential for falls Goals: Patient will remain injury free Date Initiated: 12/23/2015 Goal Status: Active Interventions: Assess fall risk on admission and as needed Notes: Nutrition Nursing Diagnoses: Imbalanced nutrition Goals: Patient/caregiver verbalizes understanding of need to maintain therapeutic glucose control per primary care physician Date Initiated: 12/23/2015 Goal Status: Active Interventions: Provide education on nutrition Treatment Activities: Education provided on Nutrition : 01/21/2016 Notes: Orientation to the Wound Care Program CAIT, LOCUST ([id number]) Nursing Diagnoses: Knowledge deficit related to the wound healing center program Goals: Patient/caregiver will verbalize understanding of the Wound Healing Center Program Date Initiated: 12/23/2015 Goal Status: Active Interventions: Provide education on orientation to the wound center Notes: Venous Leg Ulcer Nursing Diagnoses: Potential for venous Insuffiency (use before diagnosis confirmed) Goals: Patient will maintain optimal edema control Date Initiated: 12/23/2015 Goal Status:  Active Interventions: Provide education on venous insufficiency Notes: Wound/Skin Impairment Nursing Diagnoses: Impaired tissue integrity Goals: Ulcer/skin breakdown will heal within 14 weeks Date Initiated: 12/23/2015 Goal Status: Active Interventions: Assess ulceration(s) every visit Notes: Electronic Signature(s) Signed: 02/19/2016 4:09:14 PM By: Elliot Gurney, RN, [name], [name], [name] Entered By: Elliot Gurney, RN, [name], Kim on 02/18/2016 12:57:10 [place], [name] ([id number]) Lao, Lakeia Elbert Ewings ([id number]) -------------------------------------------------------------------------------- Pain Assessment Details Patient Name: Grandmaison, Jeffifer L. Date of Service: 02/18/2016 12:45 PM Medical Record Patient Account Number: [id number] Number: Treating RN: Huel Coventry 07/04/1956 (59 y.o. Other Clinician: Date of Birth/Sex: Female) Treating ROBSON, MICHAEL Primary Care Physician: PATIENT, NO Physician/Extender: G Referring Physician: Everette Rank in Treatment: 8 Active Problems Location of Pain Severity and Description of Pain Patient Has Paino No Site Locations With Dressing Change: No Pain Management and Medication Current Pain Management: Electronic Signature(s) Signed: 02/19/2016 4:09:14 PM By: Elliot Gurney, RN, [name], [name], [name] Entered By: Elliot Gurney, RN, [name], Kim on 02/18/2016 12:47:41 Katayama, [name] ([id number]) -------------------------------------------------------------------------------- Patient/Caregiver Education  Details Patient Name: Mayabb, DARNEISHA WINDHORST. Date of Service: 02/18/2016 12:45 PM Medical Record Patient Account Number: [id number] Number: Treating RN: Huel Coventry 1956/05/02 (59 y.o. Other Clinician: Date of Birth/Gender: Female) Treating ROBSON, MICHAEL Primary Care Physician: PATIENT, NO Physician/Extender: G Referring Physician: Everette Rank in Treatment: 8 Education Assessment Education Provided To: Patient Education Topics  Provided Venous: Handouts: Controlling Swelling with Multilayered Compression Wraps Methods: Demonstration Responses: State content correctly Electronic Signature(s) Signed: 02/19/2016 4:09:14 PM By: Elliot Gurney, RN, [name], [name], [name] Entered By: Elliot Gurney, RN, [name], Kim on 02/18/2016 13:11:22 Merida, [name] ([id number]) -------------------------------------------------------------------------------- Wound Assessment Details Patient Name: Jolin, Idania L. Date of Service: 02/18/2016 12:45 PM Medical Record Patient Account Number: [id number] Number: Treating RN: Huel Coventry [birth year] (59 y.o. Other Clinician: Date of Birth/Sex: Female) Treating ROBSON, MICHAEL Primary Care Physician: PATIENT, NO Physician/Extender: G Referring Physician: Everette Rank in Treatment: 8 Wound Status Wound Number: 1 Primary Etiology: Venous Leg Ulcer Wound Location: Right, Medial Lower Leg Wound Status: Open Wounding Event: Blister Date Acquired: 12/15/2015 Weeks Of Treatment: 8 Clustered Wound: No Photos Wound Measurements Length: (cm) 2 % Reduction in Area Width: (cm) 1.2 % Reduction in Volu Depth: (cm) 0.1 Epithelialization: Area: (cm) 1.885 Tunneling: Volume: (cm) 0.188 : 92.7% me: 92.7% None No Wound Description Classification: Partial Thickness Foul Odor After Cle Wound Margin: Flat and Intact Exudate Amount: Large Exudate Type: Serosanguineous Exudate Color: red, brown ansing: No Wound Bed Granulation Amount: Large (67-100%) Exposed Structure Granulation Quality: Red, Friable Fascia Exposed: No Necrotic Amount: None Present (0%) Fat Layer Exposed: No Douville, Albertia L. ([id number]) Tendon Exposed: No Muscle Exposed: No Joint Exposed: No Bone Exposed: No Limited to Skin Breakdown Periwound Skin Texture Texture Color No Abnormalities Noted: No No Abnormalities Noted: No Callus: No Atrophie Blanche: No Crepitus: No Cyanosis: No Excoriation:  No Ecchymosis: No Fluctuance: No Erythema: No Friable: No Hemosiderin Staining: Yes Induration: No Mottled: No Localized Edema: No Pallor: No Rash: No Rubor: No Scarring: No Temperature / Pain Moisture Temperature: No Abnormality No Abnormalities Noted: No Tenderness on Palpation: Yes Dry / Scaly: No Maceration: No Moist: Yes Wound Preparation Ulcer Cleansing: Other: soap and water, Topical Anesthetic Applied: Other: lidocaine 4%, Treatment Notes Wound #1 (Right, Medial Lower Leg) 1. Cleansed with: Cleanse wound with antibacterial soap and water 2. Anesthetic Topical Lidocaine 4% cream to wound bed prior to debridement 3. Peri-wound Care: Barrier cream 4. Dressing Applied: Other dressing (specify in notes) 7. Secured with 4-Layer Compression System - Right Lower Extremity Notes RTD Electronic Signature(s) Signed: 02/19/2016 4:09:14 PM By: Elliot Gurney, RN, [name], [name], [name] Entered By: Elliot Gurney, RN, [name], Kim on 02/18/2016 13:09:34 Whorley, [name] ([id number]) Pulley, [name] ([id number]) -------------------------------------------------------------------------------- Vitals Details Patient Name: Sloane, Lubna L. Date of Service: 02/18/2016 12:45 PM Medical Record Patient Account Number: [id number] Number: Treating RN: Huel Coventry [birth year] (59 y.o. Other Clinician: Date of Birth/Sex: Female) Treating ROBSON, MICHAEL Primary Care Physician: PATIENT, NO Physician/Extender: G Referring Physician: Everette Rank in Treatment: 8 Vital Signs Time Taken: 12:47 Temperature (F): 98.3 Height (in): 62 Pulse (bpm): 89 Weight (lbs): 170 Respiratory Rate (breaths/min): 18 Body Mass Index (BMI): 31.1 Blood Pressure (mmHg): 151/96 Reference Range: 80 - 120 mg / dl Electronic Signature(s) Signed: 02/19/2016 4:09:14 PM By: Elliot Gurney, RN, [name], [name], [name] Entered By: Elliot Gurney, RN, [name], Kim on 02/18/2016 12:47:57

## 2016-02-25 ENCOUNTER — Encounter: Payer: Medicare Other | Admitting: Nurse Practitioner

## 2016-02-25 DIAGNOSIS — I87331 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 lower extremity: Secondary | ICD-10-CM | POA: Diagnosis not present

## 2016-02-27 NOTE — Progress Notes (Signed)
MAYZEE, REICHENBACH ([id number]) Visit Report for 02/25/2016 Arrival Information Details Patient Name: [name], [name] Date of Service: 02/25/2016 2:15 PM Medical Record Number: [id number] Patient Account Number: [id number] Date of Birth/Sex: 01-Oct-1955 (60 y.o. Female) Treating RN: Curtis Sites Primary Care Physician: PATIENT, NO Other Clinician: Leonard Downing Referring Physician: Phineas Semen Treating Physician/Extender: Eugene Garnet in Treatment: 9 Visit Information History Since Last Visit Added or deleted any medications: No Patient Arrived: Ambulatory Any new allergies or adverse reactions: No Arrival Time: 15:20 Had a fall or experienced change in No Accompanied By: son activities of daily living that may affect Transfer Assistance: None risk of falls: Patient Identification Verified: Yes Signs or symptoms of abuse/neglect since last No Secondary Verification Process Yes visito Completed: Hospitalized since last visit: No Patient Requires Transmission-Based No Pain Present Now: No Precautions: Patient Has Alerts: No Electronic Signature(s) Signed: 02/25/2016 5:14:26 PM By: Curtis Sites Entered By: Curtis Sites on 02/25/2016 15:21:23 Soderman, [name] ([id number]) -------------------------------------------------------------------------------- Encounter Discharge Information Details Patient Name: [name], [name] Date of Service: 02/25/2016 2:15 PM Medical Record Number: [id number] Patient Account Number: [id number] Date of Birth/Sex: Oct 24, 1955 (60 y.o. Female) Treating RN: Curtis Sites Primary Care Physician: PATIENT, NO Other Clinician: Referring Physician: Phineas Semen Treating Physician/Extender: Eugene Garnet in Treatment: 9 Encounter Discharge Information Items Discharge Pain Level: 0 Discharge Condition: Stable Ambulatory Status: Ambulatory Discharge Destination: Home Transportation: Private Auto Accompanied By:  son Schedule Follow-up Appointment: Yes Medication Reconciliation completed and provided to Patient/Care No Trevian Hayashida: Provided on Clinical Summary of Care: 02/25/2016 Form Type Recipient Paper Patient GC Electronic Signature(s) Signed: 02/25/2016 3:46:36 PM By: Gwenlyn Perking Entered By: Gwenlyn Perking on 02/25/2016 15:46:36 Mirabile, [name] ([id number]) -------------------------------------------------------------------------------- Lower Extremity Assessment Details Patient Name: [name], [name] Date of Service: 02/25/2016 2:15 PM Medical Record Number: [id number] Patient Account Number: [id number] Date of Birth/Sex: 10-23-1955 (60 y.o. Female) Treating RN: Curtis Sites Primary Care Physician: PATIENT, NO Other Clinician: Referring Physician: Phineas Semen Treating Physician/Extender: Eugene Garnet in Treatment: 9 Edema Assessment Assessed: [Left: No] [Right: No] Edema: [Left: Ye] [Right: s] Calf Left: Right: Point of Measurement: 31 cm From Medial Instep cm 38.3 cm Ankle Left: Right: Point of Measurement: 11 cm From Medial Instep cm 24 cm Vascular Assessment Pulses: Posterior Tibial Palpable: [Left:Yes] [Right:Yes] Dorsalis Pedis Palpable: [Right:Yes] Extremity colors, hair growth, and conditions: Extremity Color: [Right:Hyperpigmented] Hair Growth on Extremity: [Right:No] Temperature of Extremity: [Right:Warm] Capillary Refill: [Right:< 3 seconds] Electronic Signature(s) Signed: 02/25/2016 5:14:26 PM By: Curtis Sites Entered By: Curtis Sites on 02/25/2016 15:25:23 Menn, Remy Elbert Ewings ([id number]) -------------------------------------------------------------------------------- Multi Wound Chart Details Patient Name: [name], [name] Date of Service: 02/25/2016 2:15 PM Medical Record Number: [id number] Patient Account Number: [id number] Date of Birth/Sex: May 23, 1956 (60 y.o. Female) Treating RN: Curtis Sites Primary Care Physician: PATIENT,  NO Other Clinician: Referring Physician: Phineas Semen Treating Physician/Extender: Eugene Garnet in Treatment: 9 Vital Signs Height(in): 62 Pulse(bpm): 84 Weight(lbs): 170 Blood Pressure 141/82 (mmHg): Body Mass Index(BMI): 31 Temperature(F): 98.3 Respiratory Rate 18 (breaths/min): Photos: [1:No Photos] [N/A:N/A] Wound Location: [1:Right Lower Leg - Medial] [N/A:N/A] Wounding Event: [1:Blister] [N/A:N/A] Primary Etiology: [1:Venous Leg Ulcer] [N/A:N/A] Date Acquired: [1:12/15/2015] [N/A:N/A] Weeks of Treatment: [1:9] [N/A:N/A] Wound Status: [1:Open] [N/A:N/A] Measurements L x W x D 3x1.8x0.1 [N/A:N/A] (cm) Area (cm) : [1:4.241] [N/A:N/A] Volume (cm) : [1:0.424] [N/A:N/A] % Reduction in Area: [1:83.60%] [N/A:N/A] % Reduction in Volume: 83.60% [N/A:N/A] Classification: [1:Partial Thickness] [N/A:N/A] Exudate Amount: [1:Large] [N/A:N/A] Exudate Type: [1:Serosanguineous] [N/A:N/A] Exudate Color: [1:red, brown] [N/A:N/A]  Wound Margin: [1:Flat and Intact] [N/A:N/A] Granulation Amount: [1:Large (67-100%)] [N/A:N/A] Granulation Quality: [1:Red, Friable] [N/A:N/A] Necrotic Amount: [1:None Present (0%)] [N/A:N/A] Exposed Structures: [1:Fascia: No Fat: No Tendon: No Muscle: No Joint: No Bone: No Limited to Skin Breakdown] [N/A:N/A] Epithelialization: [1:None] [N/A:N/A] Periwound Skin Texture: Edema: No N/A N/A Excoriation: No Induration: No Callus: No Crepitus: No Fluctuance: No Friable: No Rash: No Scarring: No Periwound Skin Moist: Yes N/A N/A Moisture: Maceration: No Dry/Scaly: No Periwound Skin Color: Hemosiderin Staining: Yes N/A N/A Atrophie Blanche: No Cyanosis: No Ecchymosis: No Erythema: No Mottled: No Pallor: No Rubor: No Temperature: No Abnormality N/A N/A Tenderness on Yes N/A N/A Palpation: Wound Preparation: Ulcer Cleansing: Other: N/A N/A soap and water Topical Anesthetic Applied: Other: lidocaine 4% Treatment  Notes Electronic Signature(s) Signed: 02/25/2016 5:14:26 PM By: Curtis Sites Entered By: Curtis Sites on 02/25/2016 15:34:36 Inglett, [name] ([id number]) -------------------------------------------------------------------------------- Multi-Disciplinary Care Plan Details Patient Name: [name], [name] Date of Service: 02/25/2016 2:15 PM Medical Record Number: [id number] Patient Account Number: [id number] Date of Birth/Sex: 02-01-1956 (60 y.o. Female) Treating RN: Curtis Sites Primary Care Physician: PATIENT, NO Other Clinician: Referring Physician: Phineas Semen Treating Physician/Extender: Eugene Garnet in Treatment: 9 Active Inactive Abuse / Safety / Falls / Self Care Management Nursing Diagnoses: Impaired physical mobility Potential for falls Goals: Patient will remain injury free Date Initiated: 12/23/2015 Goal Status: Active Interventions: Assess fall risk on admission and as needed Notes: Nutrition Nursing Diagnoses: Imbalanced nutrition Goals: Patient/caregiver verbalizes understanding of need to maintain therapeutic glucose control per primary care physician Date Initiated: 12/23/2015 Goal Status: Active Interventions: Provide education on nutrition Treatment Activities: Education provided on Nutrition : 01/21/2016 Notes: Orientation to the Wound Care Program Nursing Diagnoses: BRINDLE, LEYBA ([id number]) Knowledge deficit related to the wound healing center program Goals: Patient/caregiver will verbalize understanding of the Wound Healing Center Program Date Initiated: 12/23/2015 Goal Status: Active Interventions: Provide education on orientation to the wound center Notes: Venous Leg Ulcer Nursing Diagnoses: Potential for venous Insuffiency (use before diagnosis confirmed) Goals: Patient will maintain optimal edema control Date Initiated: 12/23/2015 Goal Status: Active Interventions: Provide education on venous  insufficiency Notes: Wound/Skin Impairment Nursing Diagnoses: Impaired tissue integrity Goals: Ulcer/skin breakdown will heal within 14 weeks Date Initiated: 12/23/2015 Goal Status: Active Interventions: Assess ulceration(s) every visit Notes: Electronic Signature(s) Signed: 02/25/2016 5:14:26 PM By: Curtis Sites Entered By: Curtis Sites on 02/25/2016 15:33:43 Huffaker, [name] ([id number]) -------------------------------------------------------------------------------- Pain Assessment Details Patient Name: Rhodus, Auria L. Date of Service: 02/25/2016 2:15 PM Medical Record Number: [id number] Patient Account Number: [id number] Date of Birth/Sex: 11/10/1955 (60 y.o. Female) Treating RN: Curtis Sites Primary Care Physician: PATIENT, NO Other Clinician: Referring Physician: Phineas Semen Treating Physician/Extender: Eugene Garnet in Treatment: 9 Active Problems Location of Pain Severity and Description of Pain Patient Has Paino No Site Locations Pain Management and Medication Current Pain Management: Electronic Signature(s) Signed: 02/25/2016 5:14:26 PM By: Curtis Sites Entered By: Curtis Sites on 02/25/2016 15:26:23 Scherger, [name] ([id number]) -------------------------------------------------------------------------------- Patient/Caregiver Education Details Patient Name: Hedden, Taunya L. Date of Service: 02/25/2016 2:15 PM Medical Record Number: [id number] Patient Account Number: [id number] Date of Birth/Gender: [birth year] (60 y.o. Female) Treating RN: Curtis Sites Primary Care Physician: PATIENT, NO Other Clinician: Referring Physician: Phineas Semen Treating Physician/Extender: Eugene Garnet in Treatment: 9 Education Assessment Education Provided To: Patient Education Topics Provided Venous: Handouts: Other: eg elevation for edema management Methods: Demonstration, Explain/Verbal Responses: State content  correctly Electronic Signature(s) Signed: 02/25/2016 5:14:26 PM By: Curtis Sites Entered By:  Curtis Sites on 02/25/2016 15:38:28 Bignell, [name] ([id number]) -------------------------------------------------------------------------------- Wound Assessment Details Patient Name: Hochstetler, Cloey L. Date of Service: 02/25/2016 2:15 PM Medical Record Number: [id number] Patient Account Number: [id number] Date of Birth/Sex: [birth year] (60 y.o. Female) Treating RN: Curtis Sites Primary Care Physician: PATIENT, NO Other Clinician: Referring Physician: Phineas Semen Treating Physician/Extender: Eugene Garnet in Treatment: 9 Wound Status Wound Number: 1 Primary Etiology: Venous Leg Ulcer Wound Location: Right Lower Leg - Medial Wound Status: Open Wounding Event: Blister Date Acquired: 12/15/2015 Weeks Of Treatment: 9 Clustered Wound: No Photos Photo Uploaded By: Curtis Sites on 02/25/2016 16:24:02 Wound Measurements Length: (cm) 3 Width: (cm) 1.8 Depth: (cm) 0.1 Area: (cm) 4.241 Volume: (cm) 0.424 % Reduction in Area: 83.6% % Reduction in Volume: 83.6% Epithelialization: None Tunneling: No Undermining: No Wound Description Classification: Partial Thickness Wound Margin: Flat and Intact Exudate Amount: Large Exudate Type: Serosanguineous Exudate Color: red, brown Foul Odor After Cleansing: No Wound Bed Granulation Amount: Large (67-100%) Exposed Structure Granulation Quality: Red, Friable Fascia Exposed: No Necrotic Amount: None Present (0%) Fat Layer Exposed: No Tendon Exposed: No Almendarez, Alannis L. ([id number]) Muscle Exposed: No Joint Exposed: No Bone Exposed: No Limited to Skin Breakdown Periwound Skin Texture Texture Color No Abnormalities Noted: No No Abnormalities Noted: No Callus: No Atrophie Blanche: No Crepitus: No Cyanosis: No Excoriation: No Ecchymosis: No Fluctuance: No Erythema: No Friable: No Hemosiderin Staining:  Yes Induration: No Mottled: No Localized Edema: No Pallor: No Rash: No Rubor: No Scarring: No Temperature / Pain Moisture Temperature: No Abnormality No Abnormalities Noted: No Tenderness on Palpation: Yes Dry / Scaly: No Maceration: No Moist: Yes Wound Preparation Ulcer Cleansing: Other: soap and water, Topical Anesthetic Applied: Other: lidocaine 4%, Treatment Notes Wound #1 (Right, Medial Lower Leg) 1. Cleansed with: Cleanse wound with antibacterial soap and water 2. Anesthetic Topical Lidocaine 4% cream to wound bed prior to debridement 4. Dressing Applied: Other dressing (specify in notes) 5. Secondary Dressing Applied Dry Gauze 7. Secured with 4-Layer Compression System - Right Lower Extremity Notes RTD Electronic Signature(s) Signed: 02/25/2016 5:14:26 PM By: Curtis Sites Entered By: Curtis Sites on 02/25/2016 15:34:27 Petralia, [name] ([id number]) Taglieri, Torii Elbert Ewings ([id number]) -------------------------------------------------------------------------------- Vitals Details Patient Name: Fye, Nillie L. Date of Service: 02/25/2016 2:15 PM Medical Record Number: [id number] Patient Account Number: [id number] Date of Birth/Sex: 10/13/1955 (60 y.o. Female) Treating RN: Curtis Sites Primary Care Physician: PATIENT, NO Other Clinician: Referring Physician: Phineas Semen Treating Physician/Extender: Eugene Garnet in Treatment: 9 Vital Signs Time Taken: 15:22 Temperature (F): 98.3 Height (in): 62 Pulse (bpm): 84 Weight (lbs): 170 Respiratory Rate (breaths/min): 18 Body Mass Index (BMI): 31.1 Blood Pressure (mmHg): 141/82 Reference Range: 80 - 120 mg / dl Electronic Signature(s) Signed: 02/25/2016 5:14:26 PM By: Curtis Sites Entered By: Curtis Sites on 02/25/2016 15:22:34

## 2016-02-27 NOTE — Progress Notes (Signed)
[name], [name] ([id number]) Visit Report for 02/25/2016 Chief Complaint Document Details Patient Name: [name], [name] Date of Service: 02/25/2016 2:15 PM Medical Record Number: [id number] Patient Account Number: [id number] Date of Birth/Sex: 10-15-1955 (60 y.o. Female) Treating RN: Curtis Sites Primary Care Physician: PATIENT, NO Other Clinician: Referring Physician: Phineas Semen Treating Physician/Extender: Eugene Garnet in Treatment: 9 Information Obtained from: Patient Chief Complaint Patient is here for review of a fairly substantial wound on her right medial lower leg that is been present for the last week Electronic Signature(s) Signed: 02/26/2016 5:22:48 PM By: Georges Lynch FNP Entered By: Georges Lynch on 02/25/2016 15:50:37 [name], [name] ([id number]) -------------------------------------------------------------------------------- HPI Details Patient Name: [name], [name] Date of Service: 02/25/2016 2:15 PM Medical Record Number: [id number] Patient Account Number: [id number] Date of Birth/Sex: [birth year] (60 y.o. Female) Treating RN: Curtis Sites Primary Care Physician: PATIENT, NO Other Clinician: Referring Physician: Phineas Semen Treating Physician/Extender: Eugene Garnet in Treatment: 9 History of Present Illness HPI Description: 12/23/15; this is a 60 year old lady who recently relocated back to [place] from [place]. She is staying with a family friend previously lived with a daughter in [place]. 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 She is on chronic prednisone she is not a diabetic. Dose of prednisone is 10 mg. The patient tells me that roughly a week ago she developed a fairly substantial blister over this area that then ruptured. She was seen in the emergency room on 5/7 an x-ray of the leg was negative she was put on Septra although I don't  think any cultures were done. She also tells me she has had chronic edema in her legs for a number of years. She had a similar presentation to currently in the right lateral lower leg roughly a year ago that she was able to heal on her own. As noted she has systemic lupus but does not have lupus nephritis according to the patient. She has a lot of edema in her lower legs. The ABI's could not be obtained. 12/31/15; the patient's insurance which is [place] [employer] makes her out of network for any home health therefore we have been changing her dressing in our facility. I reviewed her trip to the ER on 12/21/2015 her creatinine was within the normal range hemoglobin and white count normal. Culture of the wound was negative. X-ray of the leg showed soft tissue edema no foreign bodies. We have been dressing this with Aquacel Ag due to the amount of ongoing drainage 01/07/16; the patient had her arterial studies this morning I don't have these results area she comes back in to have [place] rewrap since she doesn't have access to home health 01/14/16 I still do not have the results of her arterial studies. Our nurse correctly pointed out that we've been wrapping the left leg without an open wound I think this had to do with the fact that she had so much edema when she came in I was concerned about leaving this unwrapped. 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 We left her left leg unwrapped last week as she has no open wound here today she has extensive edema here. We did order stockings however I believe the company called but they have not called them back. She comes back in today with extensive edema  in the left leg. We have her arterial studies which showed triphasic waves and all locations tested including her posterior tibial and anterior tibial arteries bilaterally. Report listed  no hemodynamically significant bilateral lower extremity arterial stenosis. She has her venous studies later this month. I'm going to rewrap the left leg due to the extent of the edema, transitioning her into the stocking we ordered last week as soon as one is available 01/27/16 the patient arrived today with pooled serosanguineous drainage over the wound it would not absorb into her Aquacel Ag which is somewhat on. In spite of this the dimensions of her wound are down considerably and the wound bed looks healthy without any need for debridement. She is obtained her juxtalite stockings the left leg which has no open wound. We are waiting for the right leg wound to get smaller before ordering her one for the right leg 02/03/16; patient wound looked improved. No debridement was required. We've been using the juxtalite Ishii, Brelee L. ([id number]) stocking on the left leg which is no open wound. 02/11/16 wound is remarkably better. No debridement was required. Healthy rim of epithelialization. We have been using Aquacel Ag, we'll try to close this down with RTD over the next 2 weeks 02/18/16 wound continues to improve down a centimeter in length and width. No debridement was required. Advancing epithelialization. We started RTD last week 02/25/16: pt brought juxtalite for right lower leg today. nurse reports RTD was dry and adhered to the wound. required removal by NS. wound bed with 100% healthy red granulation tissue. denies systemic s/s of infection. Electronic Signature(s) Signed: 02/26/2016 5:22:48 PM By: Georges Lynch FNP Entered By: Georges Lynch on 02/25/2016 15:52:00 [name], [name] ([id number]) -------------------------------------------------------------------------------- Physical Exam Details Patient Name: [name], [name] Date of Service: 02/25/2016 2:15 PM Medical Record Number: [id number] Patient Account Number: [id number] Date of Birth/Sex: 07-05-1956 (60 y.o.  Female) Treating RN: Curtis Sites Primary Care Physician: PATIENT, NO Other Clinician: Referring Physician: Phineas Semen Treating Physician/Extender: Eugene Garnet in Treatment: 9 Constitutional Patient's appearance is neat and clean. Appears in no acute distress. Well nourished and well developed.. Ears, Nose, Mouth, and Throat Patient can hear normal speaking tones without difficulty.Marland Kitchen Respiratory Respiratory effort is easy and symmetric bilaterally. Rate is normal at rest and on room air.. Cardiovascular hemosiderin staining right lower leg. 1 + nonpitting edema of the RLE.. Integumentary (Hair, Skin) right lower leg wound without 100% red granulation tissue overlying the wound bed. no periwound erythema, induration or fluctuance. no purulence or odor. good epithelialization.Marland Kitchen Psychiatric Judgement and insight intact.. Alert and oriented times 3.. Short and long term memory intact.. No evidence of depression, anxiety, or agitation. Calm, cooperative, and communicative. Appropriate interactions and affect.. Electronic Signature(s) Signed: 02/26/2016 5:22:48 PM By: Georges Lynch FNP Entered By: Georges Lynch on 02/25/2016 15:53:55 [name], [name] ([id number]) -------------------------------------------------------------------------------- Physician Orders Details Patient Name: Hemminger, Landy L. Date of Service: 02/25/2016 2:15 PM Medical Record Number: [id number] Patient Account Number: [id number] Date of Birth/Sex: 11-06-1955 (60 y.o. Female) Treating RN: Curtis Sites Primary Care Physician: PATIENT, NO Other Clinician: Referring Physician: Phineas Semen Treating Physician/Extender: Eugene Garnet in Treatment: 9 Verbal / Phone Orders: Yes Clinician: Curtis Sites Read Back and Verified: Yes Diagnosis Coding Wound Cleansing Wound #1 Right,Medial Lower Leg o Cleanse wound with mild soap and water Anesthetic Wound #1 Right,Medial  Lower Leg o Topical Lidocaine 4% cream applied to wound bed prior to debridement Primary Wound Dressing Wound #1 Right,Medial Lower Leg o RTD -  moisten with saline prior to applying Secondary Dressing Wound #1 Right,Medial Lower Leg o Dry Gauze Dressing Change Frequency Wound #1 Right,Medial Lower Leg o Change dressing every week Follow-up Appointments Wound #1 Right,Medial Lower Leg o Return Appointment in 1 week. Edema Control Wound #1 Right,Medial Lower Leg o 4-Layer Compression System - Right Lower Extremity o Elevate legs to the level of the heart and pump ankles as often as possible Electronic Signature(s) Signed: 02/25/2016 5:14:26 PM By: Curtis Sites Signed: 02/26/2016 5:22:48 PM By: Georges Lynch FNP Entered By: Curtis Sites on 02/25/2016 15:36:45 [name], [name] ([id number]) [name], [name] ([id number]) -------------------------------------------------------------------------------- Problem List Details Patient Name: Espinola, Adele L. Date of Service: 02/25/2016 2:15 PM Medical Record Number: [id number] Patient Account Number: [id number] Date of Birth/Sex: 10/08/1955 (60 y.o. 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 Date of Service: 02/25/2016 2:15 PM Medical Record Number: [id number] Patient Account Number: [id number] Date of Birth/Sex: Jan 12, 1956 (60 y.o. Female) Treating RN: Curtis Sites Primary Care Physician: PATIENT, NO Other Clinician: Referring Physician: Phineas Semen Treating Physician/Extender: Eugene Garnet in Treatment: 9 Subjective Chief Complaint Information obtained from Patient Patient is here for review of a fairly substantial wound on her right medial lower leg that is been present for the last week History of Present Illness (HPI) 12/23/15; this is a 60 year old lady who recently relocated back to [place] from [place]. She is staying with a family friend previously lived with a daughter in [place]. 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 She is on chronic prednisone she is not a diabetic. Dose of prednisone is 10 mg. The patient tells me that roughly a week ago she developed a fairly substantial blister over this area that then ruptured. She was seen in the emergency room on 5/7 an x-ray of the leg was negative she was put on Septra although I don't think any cultures were done. She also tells me she has had chronic edema in her legs for a number of years. She had a similar presentation to currently in the right lateral lower leg roughly a year ago that she was able to heal on her own. As noted she has systemic lupus but does not have lupus nephritis according to the patient. She has a lot of edema in her lower legs. The ABI's could not be obtained. 12/31/15; the patient's insurance which is [place] [employer] makes her out of network for any home health therefore we have been changing her dressing in our facility. I reviewed her trip to the ER on 12/21/2015  her creatinine was within the normal range hemoglobin and white count normal. Culture of the wound was negative. X-ray of the leg showed soft tissue edema no foreign bodies. We have been dressing this with Aquacel Ag due to the amount of ongoing drainage 01/07/16; the patient had her arterial studies this morning  I don't have these results area she comes back in to have [place] rewrap since she doesn't have access to home health 01/14/16 I still do not have the results of her arterial studies. Our nurse correctly pointed out that we've been wrapping the left leg without an open wound I think this had to do with the fact that she had so much edema when she came in I was concerned about leaving this unwrapped. 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 We left her left leg unwrapped last week as she has no open wound here today she has extensive edema here. We did order stockings however I believe the company called but they have not called them back. She comes back in today with extensive edema in the left leg. We have her arterial studies which showed triphasic waves and all locations tested including her posterior tibial and anterior tibial arteries bilaterally. Report listed no hemodynamically significant bilateral lower extremity arterial stenosis. She has her venous studies later this month. I'm going to rewrap the left leg due to the extent of the edema, [name], [name] ([id number]) transitioning her into the stocking we ordered last week as soon as one is available 01/27/16 the patient arrived today with pooled serosanguineous drainage over the wound it would not absorb into her Aquacel Ag which is somewhat on. In spite of this the dimensions of her wound are down considerably and the wound bed looks healthy without any need for debridement. She is obtained  her juxtalite stockings the left leg which has no open wound. We are waiting for the right leg wound to get smaller before ordering her one for the right leg 02/03/16; patient wound looked improved. No debridement was required. We've been using the juxtalite stocking on the left leg which is no open wound. 02/11/16 wound is remarkably better. No debridement was required. Healthy rim of epithelialization. We have been using Aquacel Ag, we'll try to close this down with RTD over the next 2 weeks 02/18/16 wound continues to improve down a centimeter in length and width. No debridement was required. Advancing epithelialization. We started RTD last week 02/25/16: pt brought juxtalite for right lower leg today. nurse reports RTD was dry and adhered to the wound. required removal by NS. wound bed with 100% healthy red granulation tissue. denies systemic s/s of infection. denies fever, chills, body aches or malaise. Objective Constitutional Patient's appearance is neat and clean. Appears in no acute distress. Well nourished and well developed.. Vitals Time Taken: 3:22 PM, Height: 62 in, Weight: 170 lbs, BMI: 31.1, Temperature: 98.3 F, Pulse: 84 bpm, Respiratory Rate: 18 breaths/min, Blood Pressure: 141/82 mmHg. Ears, Nose, Mouth, and Throat Patient can hear normal speaking tones without difficulty.Marland Kitchen Respiratory Respiratory effort is easy and symmetric bilaterally. Rate is normal at rest and on room air.. Cardiovascular hemosiderin staining right lower leg. 1 + nonpitting edema of the RLE.Marland Kitchen Psychiatric Judgement and insight intact.. Alert and oriented times 3.. Short and long term memory intact.. No evidence of depression, anxiety, or agitation. Calm, cooperative, and communicative. Appropriate interactions and affect.. Integumentary (Hair, Skin) [name], [name] ([id number]) right lower leg wound without 100% red granulation tissue overlying the wound bed. no periwound erythema, induration or  fluctuance. no purulence or odor. good epithelialization.. Wound #1 status is Open. Original cause of wound was Blister. The wound is located on the  Right,Medial Lower Leg. The wound measures 3cm length x 1.8cm width x 0.1cm depth; 4.241cm^2 area and 0.424cm^3 volume. The wound is limited to skin breakdown. There is no tunneling or undermining noted. There is a large amount of serosanguineous drainage noted. The wound margin is flat and intact. There is large (67-100%) red, friable granulation within the wound bed. There is no necrotic tissue within the wound bed. The periwound skin appearance exhibited: Moist, Hemosiderin Staining. The periwound skin appearance did not exhibit: Callus, Crepitus, Excoriation, Fluctuance, Friable, Induration, Localized Edema, Rash, Scarring, Dry/Scaly, Maceration, Atrophie Blanche, Cyanosis, Ecchymosis, Mottled, Pallor, Rubor, Erythema. Periwound temperature was noted as No Abnormality. The periwound has tenderness on palpation. Assessment Active Problems ICD-10 I87.331 - 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 lower extremity I87.322 - Chronic venous hypertension (idiopathic) with inflammation of left lower extremity M32.10 - Systemic lupus erythematosus, organ or system involvement unspecified Diagnoses ICD-10 I87.331: 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 lower extremity I87.322: Chronic venous hypertension (idiopathic) with inflammation of left lower extremity M32.10: Systemic lupus erythematosus, organ or system involvement unspecified Plan Wound Cleansing: Wound #1 Right,Medial Lower Leg: Cleanse wound with mild soap and water Anesthetic: Wound #1 Right,Medial Lower Leg: Topical Lidocaine 4% cream applied to wound bed prior to debridement Primary Wound Dressing: Wound #1 Right,Medial Lower Leg: RTD - moisten with saline prior to applying Secondary Dressing: Wound #1  Right,Medial Lower Leg: [place], Malak L. ([id number]) Dry Gauze Dressing Change Frequency: Wound #1 Right,Medial Lower Leg: Change dressing every week Follow-up Appointments: Wound #1 Right,Medial Lower Leg: Return Appointment in 1 week. Edema Control: Wound #1 Right,Medial Lower Leg: 4-Layer Compression System - Right Lower Extremity Elevate legs to the level of the heart and pump ankles as often as possible Follow-Up Appointments: A follow-up appointment should be scheduled. A Patient Clinical Summary of Care was provided to [facility]. recommend to wt the RTD prior to placement on wound. Electronic Signature(s) Signed: 02/26/2016 5:22:48 PM By: Georges Lynch FNP Entered By: Georges Lynch on 02/25/2016 15:54:41 [name], [name] ([id number]) -------------------------------------------------------------------------------- SuperBill Details Patient Name: Hartstein, Kimbra L. Date of Service: 02/25/2016 Medical Record Number: [id number] Patient Account Number: [id number] Date of Birth/Sex: 1956/03/26 (60 y.o. Female) Treating RN: Curtis Sites Primary Care Physician: PATIENT, NO Other Clinician: Referring Physician: Phineas Semen Treating Physician/Extender: Eugene Garnet in Treatment: 9 Diagnosis Coding ICD-10 Codes Code Description 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 lower I87.331 extremity I87.322 Chronic venous hypertension (idiopathic) with inflammation of left lower extremity M32.10 Systemic lupus erythematosus, organ or system involvement unspecified Facility Procedures CPT4: Description Modifier Quantity Code [id number] (Facility Use Only) [phone] - APPLY MULTLAY COMPRS LWR RT 1 LEG Physician Procedures CPT4: Description Modifier Quantity Code [id number] - WC PHYS LEVEL 3 - EST PT 1 ICD-10 Description Diagnosis I87.331 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 lower  extremity Electronic Signature(s) Signed: 02/26/2016 5:22:48 PM By: Georges Lynch FNP Entered By: Georges Lynch on 02/25/2016 15:55:04

## 2016-03-02 ENCOUNTER — Encounter: Payer: Medicare Other | Admitting: Internal Medicine

## 2016-03-02 DIAGNOSIS — I87331 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 lower extremity: Secondary | ICD-10-CM | POA: Diagnosis not present

## 2016-03-04 NOTE — Progress Notes (Signed)
LANISA, ISHLER ([id number]) Visit Report for 03/02/2016 Arrival Information Details Patient Name: [name], [name] Date of Service: 03/02/2016 1:30 PM Medical Record Patient Account Number: [id number] Number: Treating RN: Huel Coventry 1955-09-26 (59 y.o. Other Clinician: Date of Birth/Sex: Female) Treating ROBSON, MICHAEL Primary Care Physician: PATIENT, NO Physician/Extender: G Referring Physician: Everette Rank in Treatment: 10 Visit Information History Since Last Visit All ordered tests and consults were completed: No Patient Arrived: Ambulatory Added or deleted any medications: No Arrival Time: 13:44 Any new allergies or adverse reactions: No Accompanied By: son Had a fall or experienced change in No Transfer Assistance: None activities of daily living that may affect Patient Identification Verified: Yes risk of falls: Secondary Verification Process Yes Signs or symptoms of abuse/neglect since last No Completed: visito Patient Requires Transmission-Based No Hospitalized since last visit: No Precautions: Has Dressing in Place as Prescribed: Yes Patient Has Alerts: No Has Compression in Place as Prescribed: Yes Pain Present Now: No Electronic Signature(s) Signed: 03/04/2016 8:04:32 AM By: Elliot Gurney, RN, [name], [name], [name] Entered By: Elliot Gurney, RN, [name], Kim on 03/02/2016 13:45:03 Hudgins, [name] ([id number]) -------------------------------------------------------------------------------- Encounter Discharge Information Details Patient Name: Flemings, [name] Date of Service: 03/02/2016 1:30 PM Medical Record Patient Account Number: [id number] Number: Treating RN: Huel Coventry [birth year] (59 y.o. Other Clinician: Date of Birth/Sex: Female) Treating ROBSON, MICHAEL Primary Care Physician: PATIENT, NO Physician/Extender: G Referring Physician: Everette Rank in Treatment: 10 Encounter Discharge Information Items Discharge Pain Level:  0 Discharge Condition: Stable Ambulatory Status: Ambulatory Discharge Destination: Home Transportation: Private Auto Accompanied By: son Schedule Follow-up Appointment: Yes Medication Reconciliation completed and provided to Patient/Care Yes Burdett Pinzon: Provided on Clinical Summary of Care: 03/02/2016 Form Type Recipient Paper Patient GC Electronic Signature(s) Signed: 03/04/2016 8:04:32 AM By: Elliot Gurney, RN, [name], [name], [name] Previous Signature: 03/02/2016 2:26:23 PM Version By: Gwenlyn Perking Entered By: Elliot Gurney RN, [name], Kim on 03/02/2016 14:32:16 Mower, [name] ([id number]) -------------------------------------------------------------------------------- Lower Extremity Assessment Details Patient Name: Fenech, [name] Date of Service: 03/02/2016 1:30 PM Medical Record Patient Account Number: [id number] Number: Treating RN: Huel Coventry [birth year] (59 y.o. Other Clinician: Date of Birth/Sex: Female) Treating ROBSON, MICHAEL Primary Care Physician: PATIENT, NO Physician/Extender: G Referring Physician: Phineas Semen Weeks in Treatment: 10 Edema Assessment Assessed: [Left: No] [Right: No] Edema: [Left: Ye] [Right: s] Vascular Assessment Pulses: Posterior Tibial Dorsalis Pedis Palpable: [Right:Yes] Extremity colors, hair growth, and conditions: Extremity Color: [Right:Hyperpigmented] Hair Growth on Extremity: [Right:No] Temperature of Extremity: [Right:Warm] Capillary Refill: [Right:< 3 seconds] Dependent Rubor: [Right:No] Blanched when Elevated: [Right:No] Lipodermatosclerosis: [Right:No] Electronic Signature(s) Signed: 03/04/2016 8:04:32 AM By: Elliot Gurney, RN, [name], [name], [name] Entered By: Elliot Gurney, RN, [name], Kim on 03/02/2016 13:53:01 Rightmyer, [name] ([id number]) -------------------------------------------------------------------------------- Multi Wound Chart Details Patient Name: Avellino, [name] Date of Service: 03/02/2016 1:30 PM Medical Record Patient  Account Number: [id number] Number: Treating RN: Huel Coventry 1955/09/13 (59 y.o. Other Clinician: Date of Birth/Sex: Female) Treating ROBSON, MICHAEL Primary Care Physician: PATIENT, NO Physician/Extender: G Referring Physician: Everette Rank in Treatment: 10 Vital Signs Height(in): 62 Pulse(bpm): 88 Weight(lbs): 170 Blood Pressure 145/81 (mmHg): Body Mass Index(BMI): 31 Temperature(F): 98.3 Respiratory Rate 20 (breaths/min): Photos: [N/A:N/A] Wound Location: Right Lower Leg - Medial N/A N/A Wounding Event: Blister N/A N/A Primary Etiology: Venous Leg Ulcer N/A N/A Date Acquired: 12/15/2015 N/A N/A Weeks of Treatment: 10 N/A N/A Wound Status: Open N/A N/A Measurements L x W x D 2x1.4x0.1 N/A N/A (cm) Area (cm) : 2.199 N/A  N/A Volume (cm) : 0.22 N/A N/A % Reduction in Area: 91.50% N/A N/A % Reduction in Volume: 91.50% N/A N/A Classification: Partial Thickness N/A N/A Exudate Amount: Large N/A N/A Exudate Type: Serosanguineous N/A N/A Exudate Color: red, brown N/A N/A Wound Margin: Flat and Intact N/A N/A Granulation Amount: Large (67-100%) N/A N/A Granulation Quality: Red, Hyper-granulation, N/A N/A Friable Necrotic Amount: Small (1-33%) N/A N/A Toso, [name] ([id number]) Exposed Structures: Fascia: No N/A N/A Fat: No Tendon: No Muscle: No Joint: No Bone: No Limited to Skin Breakdown Epithelialization: None N/A N/A Periwound Skin Texture: Edema: Yes N/A N/A Friable: Yes Excoriation: No Induration: No Callus: No Crepitus: No Fluctuance: No Rash: No Scarring: No Periwound Skin Moist: Yes N/A N/A Moisture: Maceration: No Dry/Scaly: No Periwound Skin Color: Hemosiderin Staining: Yes N/A N/A Atrophie Blanche: No Cyanosis: No Ecchymosis: No Erythema: No Mottled: No Pallor: No Rubor: No Temperature: No Abnormality N/A N/A Tenderness on Yes N/A N/A Palpation: Wound Preparation: Ulcer Cleansing: Other: N/A N/A soap and  water Topical Anesthetic Applied: Other: lidocaine 4% Treatment Notes Electronic Signature(s) Signed: 03/04/2016 8:04:32 AM By: Elliot Gurney, RN, [name], [name], [name] Entered By: Elliot Gurney, RN, [name], Kim on 03/02/2016 14:11:45 Murrillo, [name] ([id number]) -------------------------------------------------------------------------------- Multi-Disciplinary Care Plan Details Patient Name: Brainerd, [name] Date of Service: 03/02/2016 1:30 PM Medical Record Patient Account Number: [id number] Number: Treating RN: Huel Coventry [birth year] (59 y.o. Other Clinician: Date of Birth/Sex: Female) Treating ROBSON, MICHAEL Primary Care Physician: PATIENT, NO Physician/Extender: G Referring Physician: Everette Rank in Treatment: 10 Active Inactive Abuse / Safety / Falls / Self Care Management Nursing Diagnoses: Impaired physical mobility Potential for falls Goals: Patient will remain injury free Date Initiated: 12/23/2015 Goal Status: Active Interventions: Assess fall risk on admission and as needed Notes: Nutrition Nursing Diagnoses: Imbalanced nutrition Goals: Patient/caregiver verbalizes understanding of need to maintain therapeutic glucose control per primary care physician Date Initiated: 12/23/2015 Goal Status: Active Interventions: Provide education on nutrition Treatment Activities: Education provided on Nutrition : 01/21/2016 Notes: Orientation to the Wound Care Program LEVEDA, KENDRIX ([id number]) Nursing Diagnoses: Knowledge deficit related to the wound healing center program Goals: Patient/caregiver will verbalize understanding of the Wound Healing Center Program Date Initiated: 12/23/2015 Goal Status: Active Interventions: Provide education on orientation to the wound center Notes: Venous Leg Ulcer Nursing Diagnoses: Potential for venous Insuffiency (use before diagnosis confirmed) Goals: Patient will maintain optimal edema control Date Initiated:  12/23/2015 Goal Status: Active Interventions: Provide education on venous insufficiency Notes: Wound/Skin Impairment Nursing Diagnoses: Impaired tissue integrity Goals: Ulcer/skin breakdown will heal within 14 weeks Date Initiated: 12/23/2015 Goal Status: Active Interventions: Assess ulceration(s) every visit Notes: Electronic Signature(s) Signed: 03/04/2016 8:04:32 AM By: Elliot Gurney, RN, [name], [name], [name] Entered By: Elliot Gurney, RN, [name], Kim on 03/02/2016 14:11:37 Camilli, [name] ([id number]) Goonan, [name] ([id number]) -------------------------------------------------------------------------------- Pain Assessment Details Patient Name: Monarch, [name] Date of Service: 03/02/2016 1:30 PM Medical Record Patient Account Number: [id number] Number: Treating RN: Huel Coventry July 21, 1956 (59 y.o. Other Clinician: Date of Birth/Sex: Female) Treating ROBSON, MICHAEL Primary Care Physician: PATIENT, NO Physician/Extender: G Referring Physician: Everette Rank in Treatment: 10 Active Problems Location of Pain Severity and Description of Pain Patient Has Paino No Site Locations Rate the pain. Current Pain Level: 0 Pain Management and Medication Current Pain Management: Electronic Signature(s) Signed: 03/04/2016 8:04:32 AM By: Elliot Gurney, RN, [name], [name], [name] Entered By: Elliot Gurney, RN, [name], Kim on 03/02/2016 13:46:17 Corman, [name] ([id number]) -------------------------------------------------------------------------------- Patient/Caregiver Education Details Patient Name:  Tupou, [name] Date of Service: 03/02/2016 1:30 PM Medical Record Patient Account Number: [id number] Number: Treating RN: Huel Coventry [birth year] (59 y.o. Other Clinician: Date of Birth/Gender: Female) Treating ROBSON, MICHAEL Primary Care Physician: PATIENT, NO Physician/Extender: G Referring Physician: Everette Rank in Treatment: 10 Education Assessment Education Provided  To: Patient Education Topics Provided Wound/Skin Impairment: Handouts: Caring for Your Ulcer, Other: continue wound care as prescribed Methods: Demonstration Responses: State content correctly Electronic Signature(s) Signed: 03/04/2016 8:04:32 AM By: Elliot Gurney, RN, [name], [name], [name] Entered By: Elliot Gurney, RN, [name], Kim on 03/02/2016 14:32:38 Whidden, [name] ([id number]) -------------------------------------------------------------------------------- Wound Assessment Details Patient Name: Ridinger, [name] Date of Service: 03/02/2016 1:30 PM Medical Record Patient Account Number: [id number] Number: Treating RN: Huel Coventry 1956/06/25 (59 y.o. Other Clinician: Date of Birth/Sex: Female) Treating ROBSON, MICHAEL Primary Care Physician: PATIENT, NO Physician/Extender: G Referring Physician: Phineas Semen Weeks in Treatment: 10 Wound Status Wound Number: 1 Primary Etiology: Venous Leg Ulcer Wound Location: Right Lower Leg - Medial Wound Status: Open Wounding Event: Blister Date Acquired: 12/15/2015 Weeks Of Treatment: 10 Clustered Wound: No Photos Wound Measurements Length: (cm) 2 Width: (cm) 1.4 Depth: (cm) 0.1 Area: (cm) 2.199 Volume: (cm) 0.22 % Reduction in Area: 91.5% % Reduction in Volume: 91.5% Epithelialization: None Tunneling: No Undermining: No Wound Description Classification: Partial Thickness Wound Margin: Flat and Intact Exudate Amount: Large Exudate Type: Serosanguineous Exudate Color: red, brown Foul Odor After Cleansing: No Wound Bed Granulation Amount: Large (67-100%) Exposed Structure Granulation Quality: Red, Hyper-granulation, Friable Fascia Exposed: No Necrotic Amount: Small (1-33%) Fat Layer Exposed: No Gillihan, [name] ([id number]) Necrotic Quality: Adherent Slough Tendon Exposed: No Muscle Exposed: No Joint Exposed: No Bone Exposed: No Limited to Skin Breakdown Periwound Skin Texture Texture Color No Abnormalities Noted:  No No Abnormalities Noted: No Callus: No Atrophie Blanche: No Crepitus: No Cyanosis: No Excoriation: No Ecchymosis: No Fluctuance: No Erythema: No Friable: Yes Hemosiderin Staining: Yes Induration: No Mottled: No Localized Edema: Yes Pallor: No Rash: No Rubor: No Scarring: No Temperature / Pain Moisture Temperature: No Abnormality No Abnormalities Noted: No Tenderness on Palpation: Yes Dry / Scaly: No Maceration: No Moist: Yes Wound Preparation Ulcer Cleansing: Other: soap and water, Topical Anesthetic Applied: Other: lidocaine 4%, Treatment Notes Wound #1 (Right, Medial Lower Leg) 1. Cleansed with: Cleanse wound with antibacterial soap and water 2. Anesthetic Topical Lidocaine 4% cream to wound bed prior to debridement 3. Peri-wound Care: Barrier cream 4. Dressing Applied: Other dressing (specify in notes) 5. Secondary Dressing Applied ABD Pad 7. Secured with 4-Layer Compression System - Right Lower Extremity Notes RTD Electronic Signature(s) JORDIS, [name] ([id number]) Signed: 03/04/2016 8:04:32 AM By: Elliot Gurney, RN, [name], [name], [name] Entered By: Elliot Gurney, RN, [name], Kim on 03/02/2016 13:57:07 Squyres, [name] ([id number]) -------------------------------------------------------------------------------- Vitals Details Patient Name: Abdou, [name] Date of Service: 03/02/2016 1:30 PM Medical Record Patient Account Number: [id number] Number: Treating RN: Huel Coventry 1956-04-16 (59 y.o. Other Clinician: Date of Birth/Sex: Female) Treating ROBSON, MICHAEL Primary Care Physician: PATIENT, NO Physician/Extender: G Referring Physician: Everette Rank in Treatment: 10 Vital Signs Time Taken: 14:45 Temperature (F): 98.3 Height (in): 62 Pulse (bpm): 88 Weight (lbs): 170 Respiratory Rate (breaths/min): 20 Body Mass Index (BMI): 31.1 Blood Pressure (mmHg): 145/81 Reference Range: 80 - 120 mg / dl Electronic Signature(s) Signed: 03/04/2016  8:04:32 AM By: Elliot Gurney, RN, [name], [name], [name] Entered By: Elliot Gurney, RN, [name], Kim on 03/02/2016 13:46:28

## 2016-03-04 NOTE — Progress Notes (Signed)
[name], [name] ([id number]) Visit Report for 03/02/2016 Chief Complaint Document Details Maslow, Zyion Date of Service: 03/02/2016 1:30 PM Patient Name: [name] Account Number: [id number] Medical Record Treating RN: Huel Coventry [id number] Number: Other Clinician: Jan 28, 1956 (59 y.o. Treating Zyheir Daft Date of Birth/Sex: Female) Physician/Extender: G Primary Care PATIENT, NO Physician: Referring Physician: Everette Rank in Treatment: 10 Information Obtained from: Patient Chief Complaint Patient is here for review of a fairly substantial wound on her right medial lower leg that is been present for the last week Electronic Signature(s) Signed: 03/03/2016 3:12:28 PM By: [name] Entered By: Baltazar Najjar on 03/02/2016 15:19:53 Dues, [name] ([id number]) -------------------------------------------------------------------------------- HPI Details [name] Date of Service: 03/02/2016 1:30 PM Patient Name: [name] Account Number: [id number] Medical Record Treating RN: Huel Coventry [id number] Number: Other Clinician: [birth year] (60 y.o. Treating Rajinder Mesick Date of Birth/Sex: Female) Physician/Extender: G Primary Care PATIENT, NO Physician: Referring Physician: Everette Rank in Treatment: 10 History of Present Illness HPI Description: 12/23/15; this is a 60 year old lady who recently relocated back to [place] from [place]. She is staying with a family friend previously lived with a daughter in [place]. 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 She is on chronic prednisone she is not a diabetic. Dose of prednisone is 10 mg. The patient tells me that roughly a week ago she developed a fairly substantial blister over this area that then ruptured. She was seen in the emergency room on 5/7 an x-ray of the leg was negative she was put on Septra although I don't  think any cultures were done. She also tells me she has had chronic edema in her legs for a number of years. She had a similar presentation to currently in the right lateral lower leg roughly a year ago that she was able to heal on her own. As noted she has systemic lupus but does not have lupus nephritis according to the patient. She has a lot of edema in her lower legs. The ABI's could not be obtained. 12/31/15; the patient's insurance which is [place] [employer] makes her out of network for any home health therefore we have been changing her dressing in our facility. I reviewed her trip to the ER on 12/21/2015 her creatinine was within the normal range hemoglobin and white count normal. Culture of the wound was negative. X-ray of the leg showed soft tissue edema no foreign bodies. We have been dressing this with Aquacel Ag due to the amount of ongoing drainage 01/07/16; the patient had her arterial studies this morning I don't have these results area she comes back in to have [place] rewrap since she doesn't have access to home health 01/14/16 I still do not have the results of her arterial studies. Our nurse correctly pointed out that we've been wrapping the left leg without an open wound I think this had to do with the fact that she had so much edema when she came in I was concerned about leaving this unwrapped. 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 We left her left leg unwrapped last week as she has no open wound here today she has extensive edema here. We did order stockings however I believe the company called but they have not called them back. She comes back in today with  extensive edema in the left leg. We have her arterial studies which showed triphasic waves and all locations tested including her posterior tibial and anterior tibial arteries bilaterally. Report listed  no hemodynamically significant bilateral lower extremity arterial stenosis. She has her venous studies later this month. I'm going to rewrap the left leg due to the extent of the edema, transitioning her into the stocking we ordered last week as soon as one is available 01/27/16 the patient arrived today with pooled serosanguineous drainage over the wound it would not absorb into her Aquacel Ag which is somewhat on. In spite of this the dimensions of her wound are down Schweiger, [name] ([id number]) considerably and the wound bed looks healthy without any need for debridement. She is obtained her juxtalite stockings the left leg which has no open wound. We are waiting for the right leg wound to get smaller before ordering her one for the right leg 02/03/16; patient wound looked improved. No debridement was required. We've been using the juxtalite stocking on the left leg which is no open wound. 02/11/16 wound is remarkably better. No debridement was required. Healthy rim of epithelialization. We have been using Aquacel Ag, we'll try to close this down with RTD over the next 2 weeks 02/18/16 wound continues to improve down a centimeter in length and width. No debridement was required. Advancing epithelialization. We started RTD last week 02/25/16: pt brought juxtalite for right lower leg today. nurse reports RTD was dry and adhered to the wound. required removal by NS. wound bed with 100% healthy red granulation tissue. denies systemic s/s of infection. 03/02/16; Considerable improvement. no debridement. Electronic Signature(s) Signed: 03/03/2016 3:12:28 PM By: [name] Entered By: Baltazar Najjar on 03/02/2016 15:25:45 Moor, [name] ([id number]) -------------------------------------------------------------------------------- Physical Exam Details [name] Date of Service: 03/02/2016 1:30 PM Patient Name: [name] Account Number: [id number] Medical Record Treating RN:  Huel Coventry [id number] Number: Other Clinician: May 12, 1956 (60 y.o. Treating [name] Date of Birth/Sex: Female) Physician/Extender: G Primary Care PATIENT, NO Physician: Referring Physician: Phineas Semen Weeks in Treatment: 10 Constitutional Sitting or standing Blood Pressure is within target range for patient.. Pulse regular and within target range for patient.Marland Kitchen Respirations regular, non-labored and within target range.. Temperature is normal and within the target range for the patient.. Patient's appearance is neat and clean. Appears in no acute distress. Well nourished and well developed.Marland Kitchen Respiratory Respiratory effort is easy and symmetric bilaterally. Rate is normal at rest and on room air.. Cardiovascular Pedal pulses palpable and strong bilaterally.Marland Kitchen Psychiatric No evidence of depression, anxiety, or agitation. Calm, cooperative, and communicative. Appropriate interactions and affect.. Notes wound exam; continued improvement in the state and size of the wound. Healthy granulation. no evidence of infection Electronic Signature(s) Signed: 03/03/2016 3:12:28 PM By: [name] Entered By: Baltazar Najjar on 03/02/2016 15:28:00 [place], [name] ([id number]) -------------------------------------------------------------------------------- Physician Orders Details [name] Date of Service: 03/02/2016 1:30 PM Patient Name: [name] Account Number: [id number] Medical Record Treating RN: Huel Coventry [id number] Number: Other Clinician: 1956-05-13 (59 y.o. Treating Vanilla [name] Date of Birth/Sex: Female) Physician/Extender: G Primary Care PATIENT, NO Physician: Referring Physician: Everette Rank in Treatment: 10 Verbal / Phone Orders: Yes Clinician: Huel Coventry Read Back and Verified: Yes Diagnosis Coding Wound Cleansing Wound #1 Right,Medial Lower Leg o Cleanse wound with mild soap and water Anesthetic Wound #1 Right,Medial Lower  Leg o Topical Lidocaine 4% cream applied to wound bed prior to debridement Primary Wound Dressing Wound #1 Right,Medial Lower  Leg o RTD - moisten with saline prior to applying Secondary Dressing Wound #1 Right,Medial Lower Leg o ABD pad Dressing Change Frequency Wound #1 Right,Medial Lower Leg o Change dressing every week Follow-up Appointments Wound #1 Right,Medial Lower Leg o Return Appointment in 1 week. Edema Control Wound #1 Right,Medial Lower Leg o 4-Layer Compression System - Right Lower Extremity o Elevate legs to the level of the heart and pump ankles as often as possible Electronic Signature(s) DEIRDRE, GRYDER ([id number]) Signed: 03/03/2016 3:12:28 PM By: [name] Signed: 03/04/2016 8:04:32 AM By: Elliot Gurney RN, [name], [name], [name] Entered By: Elliot Gurney, RN, [name], Kim on 03/02/2016 14:12:23 Simar, [name] ([id number]) -------------------------------------------------------------------------------- Problem List Details [name] Date of Service: 03/02/2016 1:30 PM Patient Name: [name] Account Number: [id number] Medical Record Treating RN: Huel Coventry [id number] Number: Other Clinician: [birth year] (59 y.o. 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 12/23/2015 Yes inflammation of right lower extremity I87.322 Chronic venous hypertension (idiopathic) with 12/23/2015 Yes inflammation of left lower extremity M32.10 Systemic lupus erythematosus, organ or system 12/23/2015 Yes involvement unspecified L97.211 Non-pressure chronic ulcer of right calf limited to 03/02/2016 Yes breakdown of skin Inactive Problems Resolved Problems Electronic Signature(s) Signed: 03/03/2016 3:12:28 PM By:  [name] Entered By: Baltazar Najjar on 03/02/2016 15:34:37 Kettles, [name] ([id number]) -------------------------------------------------------------------------------- Progress Note Details Mermelstein, Hanne Date of Service: 03/02/2016 1:30 PM Patient Name: [name] Account Number: [id number] Medical Record Treating RN: Huel Coventry [id number] Number: Other Clinician: November 20, 1955 (59 y.o. Treating Myrian Botello Date of Birth/Sex: Female) Physician/Extender: G Primary Care PATIENT, NO Physician: Referring Physician: Everette Rank in Treatment: 10 Subjective Chief Complaint Information obtained from Patient Patient is here for review of a fairly substantial wound on her right medial lower leg that is been present for the last week History of Present Illness (HPI) 12/23/15; this is a 60 year old lady who recently relocated back to [place] from [place]. She is staying with a family friend previously lived with a daughter in [place]. 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 She is on chronic prednisone she is not a diabetic. Dose of prednisone is 10 mg. The patient tells me that roughly a week ago she developed a fairly substantial blister over this area that then ruptured. She was seen in the emergency room on 5/7 an x-ray of the leg was negative she was put on Septra although I don't think any cultures were done. She also tells me she has had chronic edema in her legs for a number of years. She had a similar presentation to currently in the right lateral lower leg roughly a year ago that she was able to heal on her own. As noted she has systemic lupus but does not have lupus nephritis according to the patient. She has a lot of edema in her lower legs. The ABI's could not be obtained. 12/31/15; the patient's insurance which is [place] [employer] makes her out of network for any home health therefore we  have been changing her dressing in our facility. I reviewed her trip to the ER on 12/21/2015 her creatinine was within the normal range hemoglobin and white count normal. Culture of the wound was negative. X-ray of the leg showed soft tissue edema no  foreign bodies. We have been dressing this with Aquacel Ag due to the amount of ongoing drainage 01/07/16; the patient had her arterial studies this morning I don't have these results area she comes back in to have [place] rewrap since she doesn't have access to home health 01/14/16 I still do not have the results of her arterial studies. Our nurse correctly pointed out that we've been wrapping the left leg without an open wound I think this had to do with the fact that she had so much edema when she came in I was concerned about leaving this unwrapped. 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 We left her left leg unwrapped last week as she has no open wound here today she has extensive edema here. We did order stockings however I believe the company called but they have not called them [place], Latorie L. ([id number]) back. She comes back in today with extensive edema in the left leg. We have her arterial studies which showed triphasic waves and all locations tested including her posterior tibial and anterior tibial arteries bilaterally. Report listed no hemodynamically significant bilateral lower extremity arterial stenosis. She has her venous studies later this month. I'm going to rewrap the left leg due to the extent of the edema, transitioning her into the stocking we ordered last week as soon as one is available 01/27/16 the patient arrived today with pooled serosanguineous drainage over the wound it would not absorb into her Aquacel Ag which is somewhat on. In spite of this the dimensions of her wound are down considerably  and the wound bed looks healthy without any need for debridement. She is obtained her juxtalite stockings the left leg which has no open wound. We are waiting for the right leg wound to get smaller before ordering her one for the right leg 02/03/16; patient wound looked improved. No debridement was required. We've been using the juxtalite stocking on the left leg which is no open wound. 02/11/16 wound is remarkably better. No debridement was required. Healthy rim of epithelialization. We have been using Aquacel Ag, we'll try to close this down with RTD over the next 2 weeks 02/18/16 wound continues to improve down a centimeter in length and width. No debridement was required. Advancing epithelialization. We started RTD last week 02/25/16: pt brought juxtalite for right lower leg today. nurse reports RTD was dry and adhered to the wound. required removal by NS. wound bed with 100% healthy red granulation tissue. denies systemic s/s of infection. 03/02/16; Considerable improvement. no debridement. Objective Constitutional Sitting or standing Blood Pressure is within target range for patient.. Pulse regular and within target range for patient.Marland Kitchen Respirations regular, non-labored and within target range.. Temperature is normal and within the target range for the patient.. Patient's appearance is neat and clean. Appears in no acute distress. Well nourished and well developed.. Vitals Time Taken: 2:45 PM, Height: 62 in, Weight: 170 lbs, BMI: 31.1, Temperature: 98.3 F, Pulse: 88 bpm, Respiratory Rate: 20 breaths/min, Blood Pressure: 145/81 mmHg. Respiratory Respiratory effort is easy and symmetric bilaterally. Rate is normal at rest and on room air.. Cardiovascular Pedal pulses palpable and strong bilaterally.Marland Kitchen Psychiatric No evidence of depression, anxiety, or agitation. Calm, cooperative, and communicative. Appropriate interactions and affect.. General Notes: wound exam; continued improvement in  the state and size of the wound. Healthy JAMELL, KOOPMAN ([id number]) granulation. no evidence  of infection Integumentary (Hair, Skin) Wound #1 status is Open. Original cause of wound was Blister. The wound is located on the Right,Medial Lower Leg. The wound measures 2cm length x 1.4cm width x 0.1cm depth; 2.199cm^2 area and 0.22cm^3 volume. The wound is limited to skin breakdown. There is no tunneling or undermining noted. There is a large amount of serosanguineous drainage noted. The wound margin is flat and intact. There is large (67- 100%) red, friable granulation within the wound bed. There is a small (1-33%) amount of necrotic tissue within the wound bed including Adherent Slough. The periwound skin appearance exhibited: Friable, Localized Edema, Moist, Hemosiderin Staining. The periwound skin appearance did not exhibit: Callus, Crepitus, Excoriation, Fluctuance, Induration, Rash, Scarring, Dry/Scaly, Maceration, Atrophie Blanche, Cyanosis, Ecchymosis, Mottled, Pallor, Rubor, Erythema. Periwound temperature was noted as No Abnormality. The periwound has tenderness on palpation. Assessment Active Problems ICD-10 I87.331 - 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 lower extremity I87.322 - Chronic venous hypertension (idiopathic) with inflammation of left lower extremity M32.10 - Systemic lupus erythematosus, organ or system involvement unspecified Plan Wound Cleansing: Wound #1 Right,Medial Lower Leg: Cleanse wound with mild soap and water Anesthetic: Wound #1 Right,Medial Lower Leg: Topical Lidocaine 4% cream applied to wound bed prior to debridement Primary Wound Dressing: Wound #1 Right,Medial Lower Leg: RTD - moisten with saline prior to applying Secondary Dressing: Wound #1 Right,Medial Lower Leg: ABD pad Dressing Change Frequency: Wound #1 Right,Medial Lower Leg: Change dressing every week Follow-up Appointments: Wound #1  Right,Medial Lower Leg: Teston, Korey L. ([id number]) Return Appointment in 1 week. Edema Control: Wound #1 Right,Medial Lower Leg: 4-Layer Compression System - Right Lower Extremity Elevate legs to the level of the heart and pump ankles as often as possible Continue with RTD Dong remarkable well, o closure in next 2 weeksoo Helped her with the Juxtalite stockings on the left leg Electronic Signature(s) Signed: 03/03/2016 3:12:28 PM By: [name] Entered By: Baltazar Najjar on 03/02/2016 15:31:37 Speckman, [name] ([id number]) -------------------------------------------------------------------------------- SuperBill Details Cotrell, Kambry Date of Service: 03/02/2016 Patient Name: [name] Account Number: [id number] Medical Record Treating RN: Huel Coventry [id number] Number: Other Clinician: July 16, 1956 (59 y.o. Treating Sherlynn Tourville Date of Birth/Sex: Female) Physician/Extender: G Primary Care Weeks in Treatment: 10 PATIENT, NO Physician: Referring Physician: Phineas Semen Diagnosis Coding ICD-10 Codes Code Description 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 lower I87.331 extremity I87.322 Chronic venous hypertension (idiopathic) with inflammation of left lower extremity M32.10 Systemic lupus erythematosus, organ or system involvement unspecified Facility Procedures CPT4: Description Modifier Quantity Code [id number] (Facility Use Only) [phone] - APPLY MULTLAY COMPRS LWR RT 1 LEG Physician Procedures CPT4: Description Modifier Quantity Code [id number] - WC PHYS LEVEL 3 - EST PT 1 ICD-10 Description Diagnosis I87.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

## 2016-03-09 ENCOUNTER — Encounter: Payer: Medicare Other | Admitting: Internal Medicine

## 2016-03-09 DIAGNOSIS — I87331 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 lower extremity: Secondary | ICD-10-CM | POA: Diagnosis not present

## 2016-03-10 NOTE — Progress Notes (Signed)
[name] [name] [name] ([id number]) Visit Report for 03/09/2016 Chief Complaint Document Details [name] [name] Date of Service: 03/09/2016 12:45 PM Patient Name: [name] Account Number: [id number] Medical Record Treating RN: Phillis Haggis [id number] Number: Other Clinician: [birth year] (59 y.o. Treating ROBSON, MICHAEL Date of Birth/Sex: Female) Physician/Extender: G Primary Care PATIENT, NO Physician: Referring Physician: Everette Rank in Treatment: 11 Information Obtained from: Patient Chief Complaint Patient is here for review of a fairly substantial wound on her right medial lower leg that is been present for the last week Electronic Signature(s) Signed: 03/10/2016 7:54:01 AM By: [name] Entered By: Baltazar Najjar on 03/09/2016 13:27:24 [name] [name] ([id number]) -------------------------------------------------------------------------------- Debridement Details [name] [name] Date of Service: 03/09/2016 12:45 PM Patient Name: [name] Account Number: [id number] Medical Record Treating RN: Phillis Haggis [id number] Number: Other Clinician: 1955-12-30 (59 y.o. Treating ROBSON, MICHAEL Date of Birth/Sex: Female) Physician/Extender: G Primary Care PATIENT, NO Physician: Referring Physician: Everette Rank in Treatment: 11 Debridement Performed for Wound #1 Right,Medial Lower Leg Assessment: Performed By: Physician Maxwell Caul, MD Debridement: Debridement Pre-procedure Yes Verification/Time Out Taken: Start Time: 13:22 Pain Control: Other : lidocaine 4% cream Level: Skin/Subcutaneous Tissue Total Area Debrided (L x 2.4 (cm) x 1 (cm) = 2.4 (cm) W): Tissue and other Viable, Non-Viable, Exudate, Fibrin/Slough, Subcutaneous material debrided: Instrument: Curette Bleeding: Minimum Hemostasis Achieved: Pressure End Time: 13:24 Procedural Pain: 0 Post Procedural Pain: 0 Response to Treatment: Procedure was tolerated  well Post Debridement Measurements of Total Wound Length: (cm) 2.4 Width: (cm) 1 Depth: (cm) 0.1 Volume: (cm) 0.188 Post Procedure Diagnosis Same as Pre-procedure Electronic Signature(s) Signed: 03/09/2016 3:59:42 PM By: Alejandro Mulling Signed: 03/10/2016 7:54:01 AM By: [name] Entered By: Alejandro Mulling on 03/09/2016 13:24:13 [name] [name] ([id number]) [name] [name] ([id number]) -------------------------------------------------------------------------------- HPI Details [name] Date of Service: 03/09/2016 12:45 PM Patient Name: [name] Account Number: [id number] Medical Record Treating RN: Phillis Haggis [id number] Number: Other Clinician: 1956-04-02 (59 y.o. Treating ROBSON, MICHAEL Date of Birth/Sex: Female) Physician/Extender: G Primary Care PATIENT, NO Physician: Referring Physician: Everette Rank in Treatment: 11 History of Present Illness HPI Description: 12/23/15; this is a 60 year old lady who recently relocated back to [place] from [place]. She is staying with a family friend previously lived with a daughter in [place]. 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 She is on chronic prednisone she is not a diabetic. Dose of prednisone is 10 mg. The patient tells me that roughly a week ago she developed a fairly substantial blister over this area that then ruptured. She was seen in the emergency room on 5/7 an x-ray of the leg was negative she was put on Septra although I don't think any cultures were done. She also tells me she has had chronic edema in her legs for a number of years. She had a similar presentation to currently in the right lateral lower leg roughly a year ago that she was able to heal on her own. As noted she has systemic lupus but does not have lupus nephritis according to the patient. She has a lot of edema in her lower legs. The ABI's could  not be obtained. 12/31/15; the patient's insurance which is [place] [employer] makes her out of network for any home health therefore we have been changing her dressing in our facility. I reviewed her trip to the ER on 12/21/2015 her creatinine was within the normal range hemoglobin  and white count normal. Culture of the wound was negative. X-ray of the leg showed soft tissue edema no foreign bodies. We have been dressing this with Aquacel Ag due to the amount of ongoing drainage 01/07/16; the patient had her arterial studies this morning I don't have these results area she comes back in to have [place] rewrap since she doesn't have access to home health 01/14/16 I still do not have the results of her arterial studies. Our nurse correctly pointed out that we've been wrapping the left leg without an open wound I think this had to do with the fact that she had so much edema when she came in I was concerned about leaving this unwrapped. 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 We left her left leg unwrapped last week as she has no open wound here today she has extensive edema here. We did order stockings however I believe the company called but they have not called them back. She comes back in today with extensive edema in the left leg. We have her arterial studies which showed triphasic waves and all locations tested including her posterior tibial and anterior tibial arteries bilaterally. Report listed no hemodynamically significant bilateral lower extremity arterial stenosis. She has her venous studies later this month. I'm going to rewrap the left leg due to the extent of the edema, transitioning her into the stocking we ordered last week as soon as one is available 01/27/16 the patient arrived today with pooled serosanguineous drainage over the wound it would not absorb into her  Aquacel Ag which is somewhat on. In spite of this the dimensions of her wound are down [name] [name] L. ([id number]) considerably and the wound bed looks healthy without any need for debridement. She is obtained her juxtalite stockings the left leg which has no open wound. We are waiting for the right leg wound to get smaller before ordering her one for the right leg 02/03/16; patient wound looked improved. No debridement was required. We've been using the juxtalite stocking on the left leg which is no open wound. 02/11/16 wound is remarkably better. No debridement was required. Healthy rim of epithelialization. We have been using Aquacel Ag, we'll try to close this down with RTD over the next 2 weeks 02/18/16 wound continues to improve down a centimeter in length and width. No debridement was required. Advancing epithelialization. We started RTD last week 02/25/16: pt brought juxtalite for right lower leg today. nurse reports RTD was dry and adhered to the wound. required removal by NS. wound bed with 100% healthy red granulation tissue. denies systemic s/s of infection. 03/02/16; Considerable improvement. no debridement. 03/09/16; wound bed is very vascular no major change in dimensions. Continuing with RTD Electronic Signature(s) Signed: 03/10/2016 7:54:01 AM By: [name] Entered By: Baltazar Najjar on 03/09/2016 13:28:03 [name] [name] ([id number]) -------------------------------------------------------------------------------- Physical Exam Details [name] Date of Service: 03/09/2016 12:45 PM Patient Name: [name] Account Number: [id number] Medical Record Treating RN: Phillis Haggis [id number] Number: Other Clinician: 05/25/1956 (59 y.o. Treating ROBSON, MICHAEL Date of Birth/Sex: Female) Physician/Extender: G Primary Care PATIENT, NO Physician: Referring Physician: Everette Rank in Treatment: 11 Notes Wound exam; the base of the wound is very  vascular. Surrounding skin/epithelium/eschar debrided. However the underlying epithelialization is really quite fragile. The wound base itself other than being quite vascular is unremarkable. Electronic Signature(s) Signed:  03/10/2016 7:54:01 AM By: [name] Entered By: Baltazar Najjar on 03/09/2016 13:29:05 Tritch, [name] [name] ([id number]) -------------------------------------------------------------------------------- Physician Orders Details [name] Date of Service: 03/09/2016 12:45 PM Patient Name: [name] Account Number: [id number] Medical Record Treating RN: Phillis Haggis [id number] Number: Other Clinician: 1955-12-27 (59 y.o. Treating ROBSON, MICHAEL Date of Birth/Sex: Female) Physician/Extender: G Primary Care PATIENT, NO Physician: Referring Physician: Everette Rank in Treatment: 10 Verbal / Phone Orders: Yes Clinician: Pinkerton, Debi Read Back and Verified: Yes Diagnosis Coding Wound Cleansing Wound #1 Right,Medial Lower Leg o Cleanse wound with mild soap and water Anesthetic Wound #1 Right,Medial Lower Leg o Topical Lidocaine 4% cream applied to wound bed prior to debridement Primary Wound Dressing Wound #1 Right,Medial Lower Leg o Other: - RTD Secondary Dressing Wound #1 Right,Medial Lower Leg o ABD pad Dressing Change Frequency Wound #1 Right,Medial Lower Leg o Change dressing every week Follow-up Appointments Wound #1 Right,Medial Lower Leg o Return Appointment in 1 week. Edema Control Wound #1 Right,Medial Lower Leg o 4-Layer Compression System - Right Lower Extremity - unna to anchor o Other: - Juxtalite to left leg Additional Orders / Instructions Wound #1 Right,Medial Lower Leg [name] [name] L. ([id number]) o Increase protein intake. Electronic Signature(s) Signed: 03/09/2016 3:59:42 PM By: Alejandro Mulling Signed: 03/10/2016 7:54:01 AM By: [name] Entered By: Alejandro Mulling on  03/09/2016 13:40:46 Isensee, [name] [name] ([id number]) -------------------------------------------------------------------------------- Problem List Details [name] Date of Service: 03/09/2016 12:45 PM Patient Name: [name] Account Number: [id number] Medical Record Treating RN: Phillis Haggis [id number] Number: Other Clinician: [birth year] (59 y.o. 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 Account Number: [id number] Medical Record Treating RN: Phillis Haggis [id number] Number: Other Clinician: 1956/02/23 (59 y.o. Treating ROBSON, MICHAEL Date of Birth/Sex: Female) Physician/Extender: G Primary Care PATIENT, NO Physician: Referring Physician: Everette Rank in Treatment: 11 Subjective Chief Complaint Information obtained from Patient Patient is here for review of a fairly substantial wound on her right medial lower leg that is been present for the last week History of Present Illness  (HPI) 12/23/15; this is a 60 year old lady who recently relocated back to [place] from [place]. She is staying with a family friend previously lived with a daughter in [place]. 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 She is on chronic prednisone she is not a diabetic. Dose of prednisone is 10 mg. The patient tells me that roughly a week ago she developed a fairly substantial blister over this area that then ruptured. She was seen in the emergency room on 5/7 an x-ray of the leg was negative she was put on Septra although I don't think any cultures were done. She also tells me she has had chronic edema in her legs for a  number of years. She had a similar presentation to currently in the right lateral lower leg roughly a year ago that she was able to heal on her own. As noted she has systemic lupus but does not have lupus nephritis according to the patient. She has a lot of edema in her lower legs. The ABI's could not be obtained. 12/31/15; the patient's insurance which is [place] [employer] makes her out of network for any home health therefore we have been changing her dressing in our facility. I reviewed her trip to the ER on 12/21/2015 her creatinine was within the normal range hemoglobin and white count normal. Culture of the wound was negative. X-ray of the leg showed soft tissue edema no foreign bodies. We have been dressing this with Aquacel Ag due to the amount of ongoing drainage 01/07/16; the patient had her arterial studies this morning I don't have these results area she comes back in to have [place] rewrap since she doesn't have access to home health 01/14/16 I still do not have the results of her arterial studies. Our nurse correctly pointed out that we've been wrapping the left leg without an open wound I think this had to do with the fact that she had so much edema when she came in I was concerned about leaving this unwrapped.  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 We left her left leg unwrapped last week as she has no open wound here today she has extensive edema here. We did order stockings however I believe the company called but they have not called them [name] [name] L. ([id number]) back. She comes back in today with extensive edema in the left leg. We have her arterial studies which showed triphasic waves and all locations tested including her posterior tibial and anterior tibial arteries bilaterally. Report listed no hemodynamically significant bilateral lower extremity arterial stenosis. She has her venous studies later this month. I'm going to rewrap the left leg due to the extent of the edema, transitioning her into the stocking we ordered last week as soon as one is available 01/27/16 the patient arrived today with pooled serosanguineous drainage over the wound it would not absorb into her Aquacel Ag which is somewhat on. In spite of this the dimensions of her wound are down considerably and the wound bed looks healthy without any need for debridement. She is obtained her juxtalite stockings the left leg which has no open wound. We are waiting for the right leg wound to get smaller before ordering her one for the right leg 02/03/16; patient wound looked improved. No debridement was required. We've been using the juxtalite stocking on the left leg which is no open wound. 02/11/16 wound is remarkably better. No debridement was required. Healthy rim of epithelialization. We have been using Aquacel Ag, we'll try to close this down with RTD over the next 2 weeks 02/18/16 wound continues to improve down a centimeter in length and width. No debridement was required. Advancing epithelialization. We started RTD last week 02/25/16: pt brought juxtalite for right lower leg today. nurse  reports RTD was dry and adhered to the wound. required removal by NS. wound bed with 100% healthy red granulation tissue. denies systemic s/s of infection. 03/02/16; Considerable improvement. no debridement. 03/09/16; wound bed is very vascular no major change in dimensions. Continuing with RTD Objective Constitutional Vitals Time Taken: 1:18  PM, Height: 62 in, Weight: 170 lbs, BMI: 31.1, Temperature: 98.2 F, Pulse: 75 bpm, Respiratory Rate: 20 breaths/min, Blood Pressure: 123/84 mmHg. Integumentary (Hair, Skin) Wound #1 status is Open. Original cause of wound was Blister. The wound is located on the Right,Medial Lower Leg. The wound measures 2.4cm length x 1cm width x 0.1cm depth; 1.885cm^2 area and 0.188cm^3 volume. The wound is limited to skin breakdown. There is no tunneling or undermining noted. There is a large amount of serosanguineous drainage noted. The wound margin is flat and intact. There is large (67-100%) red, friable granulation within the wound bed. There is a small (1-33%) amount of necrotic tissue within the wound bed including Adherent Slough. The periwound skin appearance exhibited: Friable, Localized Edema, Moist, Hemosiderin Staining. The periwound skin appearance did not exhibit: Callus, Crepitus, Excoriation, Fluctuance, Induration, Rash, Scarring, Dry/Scaly, Maceration, Atrophie Blanche, Cyanosis, Ecchymosis, Mottled, Pallor, Rubor, Erythema. Periwound temperature was noted as No Abnormality. The periwound has tenderness on palpation. ARVENA, CALAMARI ([id number]) Assessment Active Problems ICD-10 I87.331 - 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 lower extremity I87.322 - Chronic venous hypertension (idiopathic) with inflammation of left lower extremity M32.10 - Systemic lupus erythematosus, organ or system involvement unspecified L97.211 - Non-pressure chronic ulcer of right calf limited to breakdown of  skin Procedures Wound #1 Wound #1 is a Venous Leg Ulcer located on the Right,Medial Lower Leg . There was a Skin/Subcutaneous Tissue Debridement ([id number]) debridement with total area of 2.4 sq cm performed by Maxwell Caul, MD. with the following instrument(s): Curette to remove Viable and Non-Viable tissue/material including Exudate, Fibrin/Slough, and Subcutaneous after achieving pain control using Other (lidocaine 4% cream). A time out was conducted prior to the start of the procedure. A Minimum amount of bleeding was controlled with Pressure. The procedure was tolerated well with a pain level of 0 throughout and a pain level of 0 following the procedure. Post Debridement Measurements: 2.4cm length x 1cm width x 0.1cm depth; 0.188cm^3 volume. Post procedure Diagnosis Wound #1: Same as Pre-Procedure Plan Wound Cleansing: Wound #1 Right,Medial Lower Leg: Cleanse wound with mild soap and water Anesthetic: Wound #1 Right,Medial Lower Leg: Topical Lidocaine 4% cream applied to wound bed prior to debridement Primary Wound Dressing: Wound #1 Right,Medial Lower Leg: RTD - moisten with saline prior to applying Secondary Dressing: Wound #1 Right,Medial Lower Leg: ABD pad Dressing Change Frequency: Wound #1 Right,Medial Lower Leg: Szafranski, [name] L. ([id number]) Change dressing every week Follow-up Appointments: Wound #1 Right,Medial Lower Leg: Return Appointment in 1 week. Edema Control: Wound #1 Right,Medial Lower Leg: 4-Layer Compression System - Right Lower Extremity - unna to anchor Elevate legs to the level of the heart and pump ankles as often as possible Continue with RTD/abd/profore Electronic Signature(s) Signed: 03/10/2016 7:54:01 AM By: [name] Entered By: Baltazar Najjar on 03/09/2016 13:30:05 Ager, [name] [name] ([id number]) -------------------------------------------------------------------------------- SuperBill Details Henningsen, Declynn Date of  Service: 03/09/2016 Patient Name: [name] Account Number: [id number] Medical Record Treating RN: Phillis Haggis [id number] Number: Other Clinician: Dec 13, 1955 (59 y.o. Treating ROBSON, MICHAEL Date of Birth/Sex: Female) Physician/Extender: G Primary Care Weeks in Treatment: 23 PATIENT, NO Physician: Referring Physician: Phineas Semen Diagnosis Coding ICD-10 Codes Code Description 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 lower I87.331 extremity I87.322 Chronic venous hypertension (idiopathic) with inflammation of left lower extremity M32.10 Systemic lupus erythematosus, organ or system involvement unspecified L97.211 Non-pressure chronic ulcer of right calf limited to breakdown of skin Facility Procedures CPT4: Description  Modifier Quantity Code [id number] 11042 - DEB SUBQ TISSUE 20 SQ CM/< 1 ICD-10 Description Diagnosis I87.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

## 2016-03-10 NOTE — Progress Notes (Signed)
TRULEE, HAMSTRA ([id number]) Visit Report for 03/09/2016 Arrival Information Details Patient Name: [name], [name] Date of Service: 03/09/2016 12:45 PM Medical Record Patient Account Number: [id number] Number: Treating RN: Phillis Haggis 1955-11-27 (59 y.o. Other Clinician: Date of Birth/Sex: Female) Treating ROBSON, MICHAEL Primary Care Physician: PATIENT, NO Physician/Extender: G Referring Physician: Everette Rank in Treatment: 11 Visit Information History Since Last Visit All ordered tests and consults were completed: No Patient Arrived: Ambulatory Added or deleted any medications: No Arrival Time: 12:58 Any new allergies or adverse reactions: No Accompanied By: self Had a fall or experienced change in No Transfer Assistance: None activities of daily living that may affect Patient Identification Verified: Yes risk of falls: Secondary Verification Process Yes Signs or symptoms of abuse/neglect since last No Completed: visito Patient Requires Transmission-Based No Hospitalized since last visit: No Precautions: Pain Present Now: No Patient Has Alerts: No Electronic Signature(s) Signed: 03/09/2016 3:59:42 PM By: Alejandro Mulling Entered By: Alejandro Mulling on 03/09/2016 12:58:20 [name], [name] ([id number]) -------------------------------------------------------------------------------- Encounter Discharge Information Details Patient Name: [name], [name] Date of Service: 03/09/2016 12:45 PM Medical Record Patient Account Number: [id number] Number: Treating RN: Phillis Haggis [birth year] (59 y.o. Other Clinician: Date of Birth/Sex: Female) Treating ROBSON, MICHAEL Primary Care Physician: PATIENT, NO Physician/Extender: G Referring Physician: Everette Rank in Treatment: 11 Encounter Discharge Information Items Discharge Pain Level: 0 Discharge Condition: Stable Ambulatory Status: Ambulatory Discharge Destination:  Nursing Home Transportation: Private Auto Accompanied By: son Schedule Follow-up Appointment: Yes Medication Reconciliation completed and provided to Patient/Care Yes [name]: Provided on Clinical Summary of Care: 03/09/2016 Form Type Recipient Paper Patient GC Electronic Signature(s) Signed: 03/09/2016 1:42:53 PM By: Gwenlyn Perking Entered By: Gwenlyn Perking on 03/09/2016 13:42:53 [name], [name] ([id number]) -------------------------------------------------------------------------------- Lower Extremity Assessment Details Patient Name: [name], Mariafernanda [name]. Date of Service: 03/09/2016 12:45 PM Medical Record Patient Account Number: [id number] Number: Treating RN: Phillis Haggis 09-02-1955 (59 y.o. Other Clinician: Date of Birth/Sex: Female) Treating ROBSON, MICHAEL Primary Care Physician: PATIENT, NO Physician/Extender: G Referring Physician: Everette Rank in Treatment: 11 Edema Assessment Assessed: [Left: No] [Right: No] E[Left: dema] [Right: :] Calf Left: Right: Point of Measurement: 31 cm From Medial Instep cm 37.5 cm Ankle Left: Right: Point of Measurement: 11 cm From Medial Instep cm 24.5 cm Vascular Assessment Pulses: Posterior Tibial Dorsalis Pedis Palpable: [Right:Yes] Extremity colors, hair growth, and conditions: Extremity Color: [Right:Hyperpigmented] Temperature of Extremity: [Right:Warm] Capillary Refill: [Right:< 3 seconds] Toe Nail Assessment Left: Right: Thick: No Discolored: No Deformed: No Improper Length and Hygiene: No Electronic Signature(s) Signed: 03/09/2016 3:59:42 PM By: Alejandro Mulling Entered By: Alejandro Mulling on 03/09/2016 13:08:35 [name], [name] ([id number]) [name], [name] Kitchen ([id number]) -------------------------------------------------------------------------------- Multi Wound Chart Details Patient Name: [name], Vanette [name]. Date of Service: 03/09/2016 12:45 PM Medical Record Patient Account Number:  [id number] Number: Treating RN: Phillis Haggis May 08, 1956 (59 y.o. Other Clinician: Date of Birth/Sex: Female) Treating ROBSON, MICHAEL Primary Care Physician: PATIENT, NO Physician/Extender: G Referring Physician: Everette Rank in Treatment: 11 Vital Signs Height(in): 62 Pulse(bpm): 75 Weight(lbs): 170 Blood Pressure 123/84 (mmHg): Body Mass Index(BMI): 31 Temperature(F): 98.2 Respiratory Rate 20 (breaths/min): Photos: [N/A:N/A] Wound Location: Right Lower Leg - Medial N/A N/A Wounding Event: Blister N/A N/A Primary Etiology: Venous Leg Ulcer N/A N/A Date Acquired: 12/15/2015 N/A N/A Weeks of Treatment: 11 N/A N/A Wound Status: Open N/A N/A Measurements [name] 2.4x1x0.1 N/A N/A (cm) Area (cm) : 1.885 N/A N/A Volume (cm) : 0.188 N/A N/A %  Reduction in Area: 92.70% N/A N/A % Reduction in Volume: 92.70% N/A N/A Classification: Partial Thickness N/A N/A Exudate Amount: Large N/A N/A Exudate Type: Serosanguineous N/A N/A Exudate Color: red, brown N/A N/A Wound Margin: Flat and Intact N/A N/A Granulation Amount: Large (67-100%) N/A N/A Granulation Quality: Red, Hyper-granulation, N/A N/A Friable Necrotic Amount: Small (1-33%) N/A N/A [name], [name] ([id number]) Exposed Structures: Fascia: No N/A N/A Fat: No Tendon: No Muscle: No Joint: No Bone: No Limited to Skin Breakdown Epithelialization: None N/A N/A Debridement: Debridement ([id number]- N/A N/A 11047) Time-Out Taken: Yes N/A N/A Pain Control: Other N/A N/A Tissue Debrided: Fibrin/Slough, Exudates, N/A N/A Subcutaneous Level: Skin/Subcutaneous N/A N/A Tissue Debridement Area (sq 2.4 N/A N/A cm): Instrument: Curette N/A N/A Bleeding: Minimum N/A N/A Hemostasis Achieved: Pressure N/A N/A Procedural Pain: 0 N/A N/A Post Procedural Pain: 0 N/A N/A Debridement Treatment Procedure was tolerated N/A N/A Response: well Post Debridement 2.4x1x0.1 N/A N/A Measurements [name] x W x  D (cm) Post Debridement 0.188 N/A N/A Volume: (cm) Periwound Skin Texture: Edema: Yes N/A N/A Friable: Yes Excoriation: No Induration: No Callus: No Crepitus: No Fluctuance: No Rash: No Scarring: No Periwound Skin Moist: Yes N/A N/A Moisture: Maceration: No Dry/Scaly: No Periwound Skin Color: Hemosiderin Staining: Yes N/A N/A Atrophie Blanche: No Cyanosis: No Ecchymosis: No Erythema: No Mottled: No [name], [name] ([id number]) Pallor: No Rubor: No Temperature: No Abnormality N/A N/A Tenderness on Yes N/A N/A Palpation: Wound Preparation: Ulcer Cleansing: Other: N/A N/A soap and water Topical Anesthetic Applied: Other: lidocaine 4% Procedures Performed: Debridement N/A N/A Treatment Notes Wound #1 (Right, Medial Lower Leg) 1. Cleansed with: Cleanse wound with antibacterial soap and water 2. Anesthetic Topical Lidocaine 4% cream to wound bed prior to debridement 4. Dressing Applied: Other dressing (specify in notes) 5. Secondary Dressing Applied ABD Pad 7. Secured with Tape 4-Layer Compression System - Right Lower Extremity Notes RTD unna to anchor Electronic Signature(s) Signed: 03/09/2016 3:59:42 PM By: Alejandro Mulling Entered By: Alejandro Mulling on 03/09/2016 15:11:04 [name], [name] ([id number]) -------------------------------------------------------------------------------- Multi-Disciplinary Care Plan Details Patient Name: Meiser, Kamari [name]. Date of Service: 03/09/2016 12:45 PM Medical Record Patient Account Number: [id number] Number: Treating RN: Phillis Haggis 1956/02/06 (59 y.o. Other Clinician: Date of Birth/Sex: Female) Treating ROBSON, MICHAEL Primary Care Physician: PATIENT, NO Physician/Extender: G Referring Physician: Everette Rank in Treatment: 11 Active Inactive Abuse / Safety / Falls / Self Care Management Nursing Diagnoses: Impaired physical mobility Potential for falls Goals: Patient will remain  injury free Date Initiated: 12/23/2015 Goal Status: Active Interventions: Assess fall risk on admission and as needed Notes: Nutrition Nursing Diagnoses: Imbalanced nutrition Goals: Patient/caregiver verbalizes understanding of need to maintain therapeutic glucose control per primary care physician Date Initiated: 12/23/2015 Goal Status: Active Interventions: Provide education on nutrition Treatment Activities: Education provided on Nutrition : 01/21/2016 Notes: Orientation to the Wound Care Program NIYLA, MARONE ([id number]) Nursing Diagnoses: Knowledge deficit related to the wound healing center program Goals: Patient/caregiver will verbalize understanding of the Wound Healing Center Program Date Initiated: 12/23/2015 Goal Status: Active Interventions: Provide education on orientation to the wound center Notes: Venous Leg Ulcer Nursing Diagnoses: Potential for venous Insuffiency (use before diagnosis confirmed) Goals: Patient will maintain optimal edema control Date Initiated: 12/23/2015 Goal Status: Active Interventions: Provide education on venous insufficiency Notes: Wound/Skin Impairment Nursing Diagnoses: Impaired tissue integrity Goals: Ulcer/skin breakdown will heal within 14 weeks Date Initiated: 12/23/2015 Goal Status: Active Interventions: Assess ulceration(s) every visit Notes: Electronic Signature(s) Signed: 03/09/2016 3:59:42 PM  By: Alejandro Mulling Entered By: Alejandro Mulling on 03/09/2016 15:10:54 [name], [name] ([id number]) [name], [name] ([id number]) -------------------------------------------------------------------------------- Pain Assessment Details Patient Name: Gregg, Savahanna [name]. Date of Service: 03/09/2016 12:45 PM Medical Record Patient Account Number: [id number] Number: Treating RN: Phillis Haggis 1955-12-23 (59 y.o. Other Clinician: Date of Birth/Sex: Female) Treating ROBSON, MICHAEL Primary Care Physician:  PATIENT, NO Physician/Extender: G Referring Physician: Everette Rank in Treatment: 11 Active Problems Location of Pain Severity and Description of Pain Patient Has Paino No Site Locations With Dressing Change: No Pain Management and Medication Current Pain Management: Electronic Signature(s) Signed: 03/09/2016 3:59:42 PM By: Alejandro Mulling Entered By: Alejandro Mulling on 03/09/2016 12:58:29 [name], [name] ([id number]) -------------------------------------------------------------------------------- Patient/Caregiver Education Details Patient Name: [name], [name] Date of Service: 03/09/2016 12:45 PM Medical Record Patient Account Number: [id number] Number: Treating RN: Phillis Haggis 08-07-1956 (59 y.o. Other Clinician: Date of Birth/Gender: Female) Treating ROBSON, MICHAEL Primary Care Physician: PATIENT, NO Physician/Extender: G Referring Physician: Everette Rank in Treatment: 11 Education Assessment Education Provided To: Patient Education Topics Provided Wound/Skin Impairment: Handouts: Other: do not get wrap wet Methods: Demonstration, Explain/Verbal Responses: State content correctly Electronic Signature(s) Signed: 03/09/2016 3:59:42 PM By: Alejandro Mulling Entered By: Alejandro Mulling on 03/09/2016 13:25:59 [name], [name] ([id number]) -------------------------------------------------------------------------------- Wound Assessment Details Patient Name: [name], Kashay [name]. Date of Service: 03/09/2016 12:45 PM Medical Record Patient Account Number: [id number] Number: Treating RN: Phillis Haggis 1956/06/15 (59 y.o. Other Clinician: Date of Birth/Sex: Female) Treating ROBSON, MICHAEL Primary Care Physician: PATIENT, NO Physician/Extender: G Referring Physician: Everette Rank in Treatment: 11 Wound Status Wound Number: 1 Primary Etiology: Venous Leg Ulcer Wound Location: Right Lower Leg - Medial Wound  Status: Open Wounding Event: Blister Date Acquired: 12/15/2015 Weeks Of Treatment: 11 Clustered Wound: No Photos Wound Measurements Length: (cm) 2.4 Width: (cm) 1 Depth: (cm) 0.1 Area: (cm) 1.885 Volume: (cm) 0.188 % Reduction in Area: 92.7% % Reduction in Volume: 92.7% Epithelialization: None Tunneling: No Undermining: No Wound Description Classification: Partial Thickness Wound Margin: Flat and Intact Exudate Amount: Large Exudate Type: Serosanguineous Exudate Color: red, brown Foul Odor After Cleansing: No Wound Bed Granulation Amount: Large (67-100%) Exposed Structure Granulation Quality: Red, Hyper-granulation, Friable Fascia Exposed: No Necrotic Amount: Small (1-33%) Fat Layer Exposed: No Medici, Dinita [name]. ([id number]) Necrotic Quality: Adherent Slough Tendon Exposed: No Muscle Exposed: No Joint Exposed: No Bone Exposed: No Limited to Skin Breakdown Periwound Skin Texture Texture Color No Abnormalities Noted: No No Abnormalities Noted: No Callus: No Atrophie Blanche: No Crepitus: No Cyanosis: No Excoriation: No Ecchymosis: No Fluctuance: No Erythema: No Friable: Yes Hemosiderin Staining: Yes Induration: No Mottled: No Localized Edema: Yes Pallor: No Rash: No Rubor: No Scarring: No Temperature / Pain Moisture Temperature: No Abnormality No Abnormalities Noted: No Tenderness on Palpation: Yes Dry / Scaly: No Maceration: No Moist: Yes Wound Preparation Ulcer Cleansing: Other: soap and water, Topical Anesthetic Applied: Other: lidocaine 4%, Treatment Notes Wound #1 (Right, Medial Lower Leg) 1. Cleansed with: Cleanse wound with antibacterial soap and water 2. Anesthetic Topical Lidocaine 4% cream to wound bed prior to debridement 4. Dressing Applied: Other dressing (specify in notes) 5. Secondary Dressing Applied ABD Pad 7. Secured with Tape 4-Layer Compression System - Right Lower Extremity Notes RTD unna to anchor Electronic  Signature(s) IRIS, [name] ([id number]) Signed: 03/09/2016 3:56:52 PM By: Curtis Sites Signed: 03/09/2016 3:59:42 PM By: Alejandro Mulling Entered By: Curtis Sites on 03/09/2016 13:28:06 Sawka, [name] ([id number]) -------------------------------------------------------------------------------- Vitals Details Patient Name: [name]  [name]. Date of Service: 03/09/2016 12:45 PM Medical Record Patient Account Number: [id number] Number: Treating RN: Phillis Haggis 03/22/1956 (59 y.o. Other Clinician: Date of Birth/Sex: Female) Treating ROBSON, MICHAEL Primary Care Physician: PATIENT, NO Physician/Extender: G Referring Physician: Everette Rank in Treatment: 11 Vital Signs Time Taken: 13:18 Temperature (F): 98.2 Height (in): 62 Pulse (bpm): 75 Weight (lbs): 170 Respiratory Rate (breaths/min): 20 Body Mass Index (BMI): 31.1 Blood Pressure (mmHg): 123/84 Reference Range: 80 - 120 mg / dl Electronic Signature(s) Signed: 03/09/2016 3:59:42 PM By: Alejandro Mulling Entered By: Alejandro Mulling on 03/09/2016 13:19:11

## 2016-03-16 ENCOUNTER — Encounter: Payer: Medicare Other | Attending: Internal Medicine | Admitting: Internal Medicine

## 2016-03-16 DIAGNOSIS — R600 Localized edema: Secondary | ICD-10-CM | POA: Diagnosis not present

## 2016-03-16 DIAGNOSIS — Z8673 Personal history of transient ischemic attack (TIA), and cerebral infarction without residual deficits: Secondary | ICD-10-CM | POA: Insufficient documentation

## 2016-03-16 DIAGNOSIS — Z7952 Long term (current) use of systemic steroids: Secondary | ICD-10-CM | POA: Insufficient documentation

## 2016-03-16 DIAGNOSIS — Z79899 Other long term (current) drug therapy: Secondary | ICD-10-CM | POA: Diagnosis not present

## 2016-03-16 DIAGNOSIS — L97211 Non-pressure chronic ulcer of right calf limited to breakdown of skin: Secondary | ICD-10-CM | POA: Insufficient documentation

## 2016-03-16 DIAGNOSIS — M3210 Systemic lupus erythematosus, organ or system involvement unspecified: Secondary | ICD-10-CM | POA: Insufficient documentation

## 2016-03-16 DIAGNOSIS — I87331 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 lower extremity: Secondary | ICD-10-CM | POA: Insufficient documentation

## 2016-03-16 DIAGNOSIS — I87322 Chronic venous hypertension (idiopathic) with inflammation of left lower extremity: Secondary | ICD-10-CM | POA: Insufficient documentation

## 2016-03-16 DIAGNOSIS — I10 Essential (primary) hypertension: Secondary | ICD-10-CM | POA: Insufficient documentation

## 2016-03-17 NOTE — Progress Notes (Signed)
[name], [name] ([id number]) Visit Report for 03/16/2016 Chief Complaint Document Details [place], Juni Date of Service: 03/16/2016 12:45 PM Patient Name: [name] Account Number: [id number] Medical Record Treating RN: Phillis Haggis [id number] Number: Other Clinician: 08-12-1956 (59 y.o. Treating Juwon Scripter Date of Birth/Sex: Female) Physician/Extender: G Primary Care PATIENT, NO Physician: Referring Physician: Everette Rank in Treatment: 12 Information Obtained from: Patient Chief Complaint Patient is here for review of a fairly substantial wound on her right medial lower leg that is been present for the last week Electronic Signature(s) Signed: 03/17/2016 7:57:27 AM By: [name] Entered By: Baltazar Najjar on 03/16/2016 13:17:56 [name], [name] ([id number]) -------------------------------------------------------------------------------- HPI Details [name] Date of Service: 03/16/2016 12:45 PM Patient Name: [name] Account Number: [id number] Medical Record Treating RN: Phillis Haggis [id number] Number: Other Clinician: 1956-07-14 (59 y.o. Treating Gerrard Crystal Date of Birth/Sex: Female) Physician/Extender: G Primary Care PATIENT, NO Physician: Referring Physician: Everette Rank in Treatment: 12 History of Present Illness HPI Description: 12/23/15; this is a 60 year old lady who recently relocated back to [place] from [place]. She is staying with a family friend previously lived with a daughter in [place]. 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 She is on chronic prednisone she is not a diabetic. Dose of prednisone is 10 mg. The patient tells me that roughly a week ago she developed a fairly substantial blister over this area that then ruptured. She was seen in the emergency room on 5/7 an x-ray of the leg was negative she was put on Septra although I  don't think any cultures were done. She also tells me she has had chronic edema in her legs for a number of years. She had a similar presentation to currently in the right lateral lower leg roughly a year ago that she was able to heal on her own. As noted she has systemic lupus but does not have lupus nephritis according to the patient. She has a lot of edema in her lower legs. The ABI's could not be obtained. 12/31/15; the patient's insurance which is [place] [employer] makes her out of network for any home health therefore we have been changing her dressing in our facility. I reviewed her trip to the ER on 12/21/2015 her creatinine was within the normal range hemoglobin and white count normal. Culture of the wound was negative. X-ray of the leg showed soft tissue edema no foreign bodies. We have been dressing this with Aquacel Ag due to the amount of ongoing drainage 01/07/16; the patient had her arterial studies this morning I don't have these results area she comes back in to have [place] rewrap since she doesn't have access to home health 01/14/16 I still do not have the results of her arterial studies. Our nurse correctly pointed out that we've been wrapping the left leg without an open wound I think this had to do with the fact that she had so much edema when she came in I was concerned about leaving this unwrapped. 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 We left her left leg unwrapped last week as she has no open wound here today she has extensive edema here. We did order stockings however I believe the company called but they have not called them back. She comes back in today with  extensive edema in the left leg. We have her arterial studies which showed triphasic waves and all locations tested including her posterior tibial and anterior tibial arteries bilaterally. Report  listed no hemodynamically significant bilateral lower extremity arterial stenosis. She has her venous studies later this month. I'm going to rewrap the left leg due to the extent of the edema, transitioning her into the stocking we ordered last week as soon as one is available 01/27/16 the patient arrived today with pooled serosanguineous drainage over the wound it would not absorb into her Aquacel Ag which is somewhat on. In spite of this the dimensions of her wound are down [name], [name] ([id number]) considerably and the wound bed looks healthy without any need for debridement. She is obtained her juxtalite stockings the left leg which has no open wound. We are waiting for the right leg wound to get smaller before ordering her one for the right leg 02/03/16; patient wound looked improved. No debridement was required. We've been using the juxtalite stocking on the left leg which is no open wound. 02/11/16 wound is remarkably better. No debridement was required. Healthy rim of epithelialization. We have been using Aquacel Ag, we'll try to close this down with RTD over the next 2 weeks 02/18/16 wound continues to improve down a centimeter in length and width. No debridement was required. Advancing epithelialization. We started RTD last week 02/25/16: pt brought juxtalite for right lower leg today. nurse reports RTD was dry and adhered to the wound. required removal by NS. wound bed with 100% healthy red granulation tissue. denies systemic s/s of infection. 03/02/16; Considerable improvement. no debridement. 03/09/16; wound bed is very vascular no major change in dimensions. Continuing with RTD 03/16/16; unfortunately no major change in dimensions however the wound bed looks very healthy. I will continue with RTD for one more week. If his stalls consider change to collagen Electronic Signature(s) Signed: 03/17/2016 7:57:27 AM By: [name] Entered By: Baltazar Najjar on 03/16/2016  13:18:58 Sturkey, [name] ([id number]) -------------------------------------------------------------------------------- Physical Exam Details [name] Date of Service: 03/16/2016 12:45 PM Patient Name: [name] Account Number: [id number] Medical Record Treating RN: Phillis Haggis [id number] Number: Other Clinician: July 17, 1956 (59 y.o. Treating Mckenleigh Tarlton Date of Birth/Sex: Female) Physician/Extender: G Primary Care PATIENT, NO Physician: Referring Physician: Everette Rank in Treatment: 12 Constitutional Patient is slightly. Supine Blood Pressure is within target range for patient.. Pulse regular and within target range for patient.Marland Kitchen Respirations regular, non-labored and within target range.. Eyes Conjunctivae clear. No discharge.Marland Kitchen Respiratory Respiratory effort is easy and symmetric bilaterally. Rate is normal at rest and on room air.Marland Kitchen Lymphatic Nonpalpable popliteal or inguinal areas. Psychiatric No evidence of depression, anxiety, or agitation. Calm, cooperative, and communicative. Appropriate interactions and affect.. Notes Wound exam; the base of his wound appears stable. No debridement was necessary. No evidence of infection Electronic Signature(s) Signed: 03/17/2016 7:57:27 AM By: [name] Entered By: Baltazar Najjar on 03/16/2016 13:22:37 [name], [name] ([id number]) -------------------------------------------------------------------------------- Physician Orders Details [name] Date of Service: 03/16/2016 12:45 PM Patient Name: [name] Account Number: [id number] Medical Record Treating RN: Phillis Haggis [id number] Number: Other Clinician: [birth year] (59 y.o. Treating Champion Corales Date of Birth/Sex: Female) Physician/Extender: G Primary Care PATIENT, NO Physician: Referring Physician: Everette Rank in Treatment: 12 Verbal / Phone Orders: Yes Clinician: Pinkerton, Debi Read Back and Verified:  Yes Diagnosis Coding Wound Cleansing Wound #1 Right,Medial Lower Leg o Cleanse wound with mild soap and water Anesthetic Wound #1 Right,Medial  Lower Leg o Topical Lidocaine 4% cream applied to wound bed prior to debridement Primary Wound Dressing Wound #1 Right,Medial Lower Leg o Other: - RTD Secondary Dressing Wound #1 Right,Medial Lower Leg o ABD pad Dressing Change Frequency Wound #1 Right,Medial Lower Leg o Change dressing every week Follow-up Appointments Wound #1 Right,Medial Lower Leg o Return Appointment in 1 week. Edema Control Wound #1 Right,Medial Lower Leg o 4-Layer Compression System - Right Lower Extremity - unna to anchor o Other: - Juxtalite to left leg Additional Orders / Instructions Wound #1 Right,Medial Lower Leg [name], [name] ([id number]) o Increase protein intake. Electronic Signature(s) Signed: 03/16/2016 4:35:54 PM By: Alejandro Mulling Signed: 03/17/2016 7:57:27 AM By: [name] Entered By: Alejandro Mulling on 03/16/2016 13:16:30 [name], [name] ([id number]) -------------------------------------------------------------------------------- Problem List Details [name] Date of Service: 03/16/2016 12:45 PM Patient Name: [name] Account Number: [id number] Medical Record Treating RN: Phillis Haggis [id number] Number: Other Clinician: 12-Jun-1956 (59 y.o. 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 Account Number: [id number] Medical Record Treating RN: Phillis Haggis [id number] Number: Other Clinician: 12/26/1955 (59 y.o. Treating Quintrell Baze Date of Birth/Sex: Female) Physician/Extender: G Primary Care PATIENT, NO Physician: Referring Physician: Everette Rank in Treatment: 12 Subjective Chief Complaint Information obtained from Patient Patient is here for review of a fairly substantial wound on her right medial lower leg that is been present for the last week History of Present Illness (HPI) 12/23/15; this is a 60 year old lady who recently relocated back to [place] from [place]. She is staying with a family friend previously lived with a daughter in [place]. 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 She is on chronic prednisone she is not a diabetic. Dose of prednisone is 10 mg. The patient tells me that roughly a week ago she developed a fairly substantial blister over this area that then ruptured. She was seen in the emergency room on 5/7 an x-ray of the leg was negative she was put on Septra although I don't think any cultures were done. She also tells me she has had chronic edema in her legs for a number of years. She had a similar presentation to currently in the right lateral lower leg roughly a year ago that she was able to heal on her own. As noted she has systemic lupus but does not have lupus nephritis according  to the patient. She has a lot of edema in her lower legs. The ABI's could not be obtained. 12/31/15; the patient's insurance which is [place] [employer] makes her out of network for any home health therefore we have been changing her dressing in our facility. I reviewed her trip to the ER on 12/21/2015 her creatinine was within the normal range hemoglobin and  white count normal. Culture of the wound was negative. X-ray of the leg showed soft tissue edema no foreign bodies. We have been dressing this with Aquacel Ag due to the amount of ongoing drainage 01/07/16; the patient had her arterial studies this morning I don't have these results area she comes back in to have [place] rewrap since she doesn't have access to home health 01/14/16 I still do not have the results of her arterial studies. Our nurse correctly pointed out that we've been wrapping the left leg without an open wound I think this had to do with the fact that she had so much edema when she came in I was concerned about leaving this unwrapped. 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 We left her left leg unwrapped last week as she has no open wound here today she has extensive edema here. We did order stockings however I believe the company called but they have not called them [name], [name] ([id number]) back. She comes back in today with extensive edema in the left leg. We have her arterial studies which showed triphasic waves and all locations tested including her posterior tibial and anterior tibial arteries bilaterally. Report listed no hemodynamically significant bilateral lower extremity arterial stenosis. She has her venous studies later this month. I'm going to rewrap the left leg due to the extent of the edema, transitioning her into the stocking we ordered last week as soon as one is available 01/27/16 the  patient arrived today with pooled serosanguineous drainage over the wound it would not absorb into her Aquacel Ag which is somewhat on. In spite of this the dimensions of her wound are down considerably and the wound bed looks healthy without any need for debridement. She is obtained her juxtalite stockings the left leg which has no open wound. We are waiting for the right leg wound to get smaller before ordering her one for the right leg 02/03/16; patient wound looked improved. No debridement was required. We've been using the juxtalite stocking on the left leg which is no open wound. 02/11/16 wound is remarkably better. No debridement was required. Healthy rim of epithelialization. We have been using Aquacel Ag, we'll try to close this down with RTD over the next 2 weeks 02/18/16 wound continues to improve down a centimeter in length and width. No debridement was required. Advancing epithelialization. We started RTD last week 02/25/16: pt brought juxtalite for right lower leg today. nurse reports RTD was dry and adhered to the wound. required removal by NS. wound bed with 100% healthy red granulation tissue. denies systemic s/s of infection. 03/02/16; Considerable improvement. no debridement. 03/09/16; wound bed is very vascular no major change in dimensions. Continuing with RTD 03/16/16; unfortunately no major change in dimensions however the wound bed looks very healthy. I will continue with RTD for one more week. If his stalls consider change to collagen Objective Constitutional Patient is slightly. Supine Blood Pressure is within target range for patient.. Pulse regular and within target range for patient.Marland Kitchen Respirations regular, non-labored and within target range.. Vitals Time Taken: 12:57 PM, Height: 62 in, Weight: 170 lbs, BMI: 31.1, Temperature: 98.1 F, Pulse: 92 bpm, Respiratory Rate: 20 breaths/min, Blood Pressure: 147/77 mmHg. Eyes Conjunctivae clear. No  discharge.Marland Kitchen Respiratory Respiratory effort is easy and symmetric bilaterally. Rate is normal at rest and on room air.Marland Kitchen Lymphatic Nonpalpable popliteal or  inguinal areas. Psychiatric No evidence of depression, anxiety, or agitation. Calm, cooperative, and communicative. Appropriate [name], [name] ([id number]) interactions and affect.. General Notes: Wound exam; the base of his wound appears stable. No debridement was necessary. No evidence of infection Integumentary (Hair, Skin) Wound #1 status is Open. Original cause of wound was Blister. The wound is located on the Right,Medial Lower Leg. The wound measures 2.4cm length x 1cm width x 0.1cm depth; 1.885cm^2 area and 0.188cm^3 volume. The wound is limited to skin breakdown. There is no tunneling or undermining noted. There is a large amount of serosanguineous drainage noted. The wound margin is flat and intact. There is large (67-100%) red, friable granulation within the wound bed. There is no necrotic tissue within the wound bed. The periwound skin appearance exhibited: Friable, Localized Edema, Moist, Hemosiderin Staining. The periwound skin appearance did not exhibit: Callus, Crepitus, Excoriation, Fluctuance, Induration, Rash, Scarring, Dry/Scaly, Maceration, Atrophie Blanche, Cyanosis, Ecchymosis, Mottled, Pallor, Rubor, Erythema. Periwound temperature was noted as No Abnormality. The periwound has tenderness on palpation. Assessment Active Problems ICD-10 I87.331 - 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 lower extremity I87.322 - Chronic venous hypertension (idiopathic) with inflammation of left lower extremity M32.10 - Systemic lupus erythematosus, organ or system involvement unspecified L97.211 - Non-pressure chronic ulcer of right calf limited to breakdown of skin Plan Wound Cleansing: Wound #1 Right,Medial Lower Leg: Cleanse wound with mild soap and water Anesthetic: Wound #1  Right,Medial Lower Leg: Topical Lidocaine 4% cream applied to wound bed prior to debridement Primary Wound Dressing: Wound #1 Right,Medial Lower Leg: Other: - RTD Secondary Dressing: Wound #1 Right,Medial Lower Leg: ABD pad [name], [name] ([id number]) Dressing Change Frequency: Wound #1 Right,Medial Lower Leg: Change dressing every week Follow-up Appointments: Wound #1 Right,Medial Lower Leg: Return Appointment in 1 week. Edema Control: Wound #1 Right,Medial Lower Leg: 4-Layer Compression System - Right Lower Extremity - unna to anchor Other: - Juxtalite to left leg Additional Orders / Instructions: Wound #1 Right,Medial Lower Leg: Increase protein intake. Continue RTD for another week. Consider change in primary dressing next week if no advancement Electronic Signature(s) Signed: 03/17/2016 7:57:27 AM By: [name] Entered By: Baltazar Najjar on 03/16/2016 13:23:36 [name], [name] ([id number]) -------------------------------------------------------------------------------- SuperBill Details [name], [name] Date of Service: 03/16/2016 Patient Name: [name] Account Number: [id number] Medical Record Treating RN: Phillis Haggis [id number] Number: Other Clinician: 09/17/1955 (59 y.o. Treating Kemuel Buchmann Date of Birth/Sex: Female) Physician/Extender: G Primary Care Weeks in Treatment: 67 PATIENT, NO Physician: Referring Physician: Phineas Semen Diagnosis Coding ICD-10 Codes Code Description 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 lower I87.331 extremity I87.322 Chronic venous hypertension (idiopathic) with inflammation of left lower extremity M32.10 Systemic lupus erythematosus, organ or system involvement unspecified L97.211 Non-pressure chronic ulcer of right calf limited to breakdown of skin Facility Procedures CPT4: Description Modifier Quantity Code [id number] (Facility Use Only) [phone] - APPLY MULTLAY  COMPRS LWR RT 1 LEG Physician Procedures CPT4: Description Modifier Quantity Code [id number] 99213 - WC PHYS LEVEL 3 - EST PT 1 ICD-10 Description Diagnosis I87.331 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 lower extremity Electronic Signature(s) Signed: 03/16/2016 4:35:54 PM By: Alejandro Mulling Signed: 03/17/2016 7:57:27 AM By: [name] Entered By: Alejandro Mulling on 03/16/2016 13:51:17

## 2016-03-17 NOTE — Progress Notes (Signed)
TENAYA, HILYER ([id number]) Visit Report for 03/16/2016 Arrival Information Details Patient Name: [name], [name] Date of Service: 03/16/2016 12:45 PM Medical Record Patient Account Number: [id number] Number: Treating RN: Phillis Haggis 09-19-1955 (59 y.o. Other Clinician: Date of Birth/Sex: Female) Treating ROBSON, MICHAEL Primary Care Physician: PATIENT, NO Physician/Extender: G Referring Physician: Everette Rank in Treatment: 12 Visit Information History Since Last Visit All ordered tests and consults were completed: No Patient Arrived: Ambulatory Added or deleted any medications: No Arrival Time: 12:56 Any new allergies or adverse reactions: No Accompanied By: self Had a fall or experienced change in No Transfer Assistance: None activities of daily living that may affect Patient Identification Verified: Yes risk of falls: Secondary Verification Process Yes Signs or symptoms of abuse/neglect since last No Completed: visito Patient Requires Transmission-Based No Hospitalized since last visit: No Precautions: Pain Present Now: No Patient Has Alerts: No Electronic Signature(s) Signed: 03/16/2016 4:35:54 PM By: Alejandro Mulling Entered By: Alejandro Mulling on 03/16/2016 12:57:04 Hildreth, [place] Elbert Ewings ([id number]) -------------------------------------------------------------------------------- Encounter Discharge Information Details Patient Name: Matthis, Irine L. Date of Service: 03/16/2016 12:45 PM Medical Record Patient Account Number: [id number] Number: Treating RN: Phillis Haggis 1956-06-19 (59 y.o. Other Clinician: Date of Birth/Sex: Female) Treating ROBSON, MICHAEL Primary Care Physician: PATIENT, NO Physician/Extender: G Referring Physician: Everette Rank in Treatment: 12 Encounter Discharge Information Items Discharge Pain Level: 0 Discharge Condition: Stable Ambulatory Status: Ambulatory Discharge Destination:  Nursing Home Transportation: Private Auto Accompanied By: son Schedule Follow-up Appointment: Yes Medication Reconciliation completed and provided to Patient/Care Yes Kirsty Monjaraz: Provided on Clinical Summary of Care: 03/16/2016 Form Type Recipient Paper Patient GC Electronic Signature(s) Signed: 03/16/2016 1:30:58 PM By: Gwenlyn Perking Entered By: Gwenlyn Perking on 03/16/2016 13:30:58 Wentzel, [name] ([id number]) -------------------------------------------------------------------------------- Lower Extremity Assessment Details Patient Name: Proia, Leidy L. Date of Service: 03/16/2016 12:45 PM Medical Record Patient Account Number: [id number] Number: Treating RN: Phillis Haggis [birth year] (59 y.o. Other Clinician: Date of Birth/Sex: Female) Treating ROBSON, MICHAEL Primary Care Physician: PATIENT, NO Physician/Extender: G Referring Physician: Everette Rank in Treatment: 12 Edema Assessment Assessed: [Left: No] [Right: No] E[Left: dema] [Right: :] Calf Left: Right: Point of Measurement: 31 cm From Medial Instep cm 40.5 cm Ankle Left: Right: Point of Measurement: 11 cm From Medial Instep cm 26 cm Vascular Assessment Pulses: Posterior Tibial Dorsalis Pedis Palpable: [Right:Yes] Extremity colors, hair growth, and conditions: Extremity Color: [Right:Hyperpigmented] Temperature of Extremity: [Right:Warm] Capillary Refill: [Right:< 3 seconds] Toe Nail Assessment Left: Right: Thick: No Discolored: No Deformed: No Improper Length and Hygiene: No Electronic Signature(s) Signed: 03/16/2016 4:35:54 PM By: Alejandro Mulling Entered By: Alejandro Mulling on 03/16/2016 13:03:52 Khatib, [name] ([id number]) Kalil, Janye L. ([id number]) -------------------------------------------------------------------------------- Multi Wound Chart Details Patient Name: Gowans, Shiela L. Date of Service: 03/16/2016 12:45 PM Medical Record Patient Account Number:  [id number] Number: Treating RN: Phillis Haggis Sep 07, 1955 (59 y.o. Other Clinician: Date of Birth/Sex: Female) Treating ROBSON, MICHAEL Primary Care Physician: PATIENT, NO Physician/Extender: G Referring Physician: Everette Rank in Treatment: 12 Vital Signs Height(in): 62 Pulse(bpm): 92 Weight(lbs): 170 Blood Pressure 147/77 (mmHg): Body Mass Index(BMI): 31 Temperature(F): 98.1 Respiratory Rate 20 (breaths/min): Photos: [1:No Photos] [N/A:N/A] Wound Location: [1:Right Lower Leg - Medial] [N/A:N/A] Wounding Event: [1:Blister] [N/A:N/A] Primary Etiology: [1:Venous Leg Ulcer] [N/A:N/A] Date Acquired: [1:12/15/2015] [N/A:N/A] Weeks of Treatment: [1:12] [N/A:N/A] Wound Status: [1:Open] [N/A:N/A] Measurements L x W x D 2.4x1x0.1 [N/A:N/A] (cm) Area (cm) : [1:1.885] [N/A:N/A] Volume (cm) : [1:0.188] [N/A:N/A] % Reduction in Area: [1:92.70%] [N/A:N/A] %  Reduction in Volume: 92.70% [N/A:N/A] Classification: [1:Partial Thickness] [N/A:N/A] Exudate Amount: [1:Large] [N/A:N/A] Exudate Type: [1:Serosanguineous] [N/A:N/A] Exudate Color: [1:red, brown] [N/A:N/A] Wound Margin: [1:Flat and Intact] [N/A:N/A] Granulation Amount: [1:Large (67-100%)] [N/A:N/A] Granulation Quality: [1:Red, Hyper-granulation, Friable] [N/A:N/A] Necrotic Amount: [1:None Present (0%)] [N/A:N/A] Exposed Structures: [1:Fascia: No Fat: No Tendon: No Muscle: No Joint: No Bone: No] [N/A:N/A] Limited to Skin Breakdown Epithelialization: None N/A N/A Periwound Skin Texture: Edema: Yes N/A N/A Friable: Yes Excoriation: No Induration: No Callus: No Crepitus: No Fluctuance: No Rash: No Scarring: No Periwound Skin Moist: Yes N/A N/A Moisture: Maceration: No Dry/Scaly: No Periwound Skin Color: Hemosiderin Staining: Yes N/A N/A Atrophie Blanche: No Cyanosis: No Ecchymosis: No Erythema: No Mottled: No Pallor: No Rubor: No Temperature: No Abnormality N/A N/A Tenderness on Yes N/A  N/A Palpation: Wound Preparation: Ulcer Cleansing: Other: N/A N/A soap and water Topical Anesthetic Applied: Other: lidocaine 4% Treatment Notes Electronic Signature(s) Signed: 03/16/2016 4:35:54 PM By: Alejandro Mulling Entered By: Alejandro Mulling on 03/16/2016 13:09:24 Thang, [name] ([id number]) -------------------------------------------------------------------------------- Multi-Disciplinary Care Plan Details Patient Name: Heldman, Bhavana L. Date of Service: 03/16/2016 12:45 PM Medical Record Patient Account Number: [id number] Number: Treating RN: Phillis Haggis [birth year] (59 y.o. Other Clinician: Date of Birth/Sex: Female) Treating ROBSON, MICHAEL Primary Care Physician: PATIENT, NO Physician/Extender: G Referring Physician: Everette Rank in Treatment: 12 Active Inactive Abuse / Safety / Falls / Self Care Management Nursing Diagnoses: Impaired physical mobility Potential for falls Goals: Patient will remain injury free Date Initiated: 12/23/2015 Goal Status: Active Interventions: Assess fall risk on admission and as needed Notes: Nutrition Nursing Diagnoses: Imbalanced nutrition Goals: Patient/caregiver verbalizes understanding of need to maintain therapeutic glucose control per primary care physician Date Initiated: 12/23/2015 Goal Status: Active Interventions: Provide education on nutrition Treatment Activities: Education provided on Nutrition : 01/21/2016 Notes: Orientation to the Wound Care Program MARUA, QIN ([id number]) Nursing Diagnoses: Knowledge deficit related to the wound healing center program Goals: Patient/caregiver will verbalize understanding of the Wound Healing Center Program Date Initiated: 12/23/2015 Goal Status: Active Interventions: Provide education on orientation to the wound center Notes: Venous Leg Ulcer Nursing Diagnoses: Potential for venous Insuffiency (use before diagnosis  confirmed) Goals: Patient will maintain optimal edema control Date Initiated: 12/23/2015 Goal Status: Active Interventions: Provide education on venous insufficiency Notes: Wound/Skin Impairment Nursing Diagnoses: Impaired tissue integrity Goals: Ulcer/skin breakdown will heal within 14 weeks Date Initiated: 12/23/2015 Goal Status: Active Interventions: Assess ulceration(s) every visit Notes: Electronic Signature(s) Signed: 03/16/2016 4:35:54 PM By: Alejandro Mulling Entered By: Alejandro Mulling on 03/16/2016 13:09:08 Klahr, Ladesha Elbert Ewings ([id number]) Schaus, Britne LMarland Kitchen ([id number]) -------------------------------------------------------------------------------- Pain Assessment Details Patient Name: Hazzard, Aimy L. Date of Service: 03/16/2016 12:45 PM Medical Record Patient Account Number: [id number] Number: Treating RN: Phillis Haggis 12-29-1955 (59 y.o. Other Clinician: Date of Birth/Sex: Female) Treating ROBSON, MICHAEL Primary Care Physician: PATIENT, NO Physician/Extender: G Referring Physician: Everette Rank in Treatment: 12 Active Problems Location of Pain Severity and Description of Pain Patient Has Paino No Site Locations With Dressing Change: No Pain Management and Medication Current Pain Management: Electronic Signature(s) Signed: 03/16/2016 4:35:54 PM By: Alejandro Mulling Entered By: Alejandro Mulling on 03/16/2016 12:57:09 Niblett, [name] ([id number]) -------------------------------------------------------------------------------- Patient/Caregiver Education Details Patient Name: Nedrow, Verta L. Date of Service: 03/16/2016 12:45 PM Medical Record Patient Account Number: [id number] Number: Treating RN: Phillis Haggis 09-Jul-1956 (59 y.o. Other Clinician: Date of Birth/Gender: Female) Treating ROBSON, MICHAEL Primary Care Physician: PATIENT, NO Physician/Extender: G Referring Physician: Everette Rank in Treatment:  12  Education Assessment Education Provided To: Patient Education Topics Provided Wound/Skin Impairment: Handouts: Other: do not get wrap wet Methods: Demonstration, Explain/Verbal Responses: State content correctly Electronic Signature(s) Signed: 03/16/2016 4:35:54 PM By: Alejandro Mulling Entered By: Alejandro Mulling on 03/16/2016 13:10:49 Mclarty, Virgie Elbert Ewings ([id number]) -------------------------------------------------------------------------------- Wound Assessment Details Patient Name: Hipp, Bahja L. Date of Service: 03/16/2016 12:45 PM Medical Record Patient Account Number: [id number] Number: Treating RN: Phillis Haggis 03/24/1956 (59 y.o. Other Clinician: Date of Birth/Sex: Female) Treating ROBSON, MICHAEL Primary Care Physician: PATIENT, NO Physician/Extender: G Referring Physician: Everette Rank in Treatment: 12 Wound Status Wound Number: 1 Primary Etiology: Venous Leg Ulcer Wound Location: Right Lower Leg - Medial Wound Status: Open Wounding Event: Blister Date Acquired: 12/15/2015 Weeks Of Treatment: 12 Clustered Wound: No Photos Photo Uploaded By: Alejandro Mulling on 03/16/2016 13:11:32 Wound Measurements Length: (cm) 2.4 Width: (cm) 1 Depth: (cm) 0.1 Area: (cm) 1.885 Volume: (cm) 0.188 % Reduction in Area: 92.7% % Reduction in Volume: 92.7% Epithelialization: None Tunneling: No Undermining: No Wound Description Classification: Partial Thickness Wound Margin: Flat and Intact Exudate Amount: Large Exudate Type: Serosanguineous Exudate Color: red, brown Foul Odor After Cleansing: No Wound Bed Granulation Amount: Large (67-100%) Exposed Structure Granulation Quality: Red, Hyper-granulation, Friable Fascia Exposed: No Necrotic Amount: None Present (0%) Fat Layer Exposed: No Parlee, Shiron L. ([id number]) Tendon Exposed: No Muscle Exposed: No Joint Exposed: No Bone Exposed: No Limited to Skin Breakdown Periwound Skin  Texture Texture Color No Abnormalities Noted: No No Abnormalities Noted: No Callus: No Atrophie Blanche: No Crepitus: No Cyanosis: No Excoriation: No Ecchymosis: No Fluctuance: No Erythema: No Friable: Yes Hemosiderin Staining: Yes Induration: No Mottled: No Localized Edema: Yes Pallor: No Rash: No Rubor: No Scarring: No Temperature / Pain Moisture Temperature: No Abnormality No Abnormalities Noted: No Tenderness on Palpation: Yes Dry / Scaly: No Maceration: No Moist: Yes Wound Preparation Ulcer Cleansing: Other: soap and water, Topical Anesthetic Applied: Other: lidocaine 4%, Treatment Notes Wound #1 (Right, Medial Lower Leg) 1. Cleansed with: Cleanse wound with antibacterial soap and water 2. Anesthetic Topical Lidocaine 4% cream to wound bed prior to debridement 4. Dressing Applied: Other dressing (specify in notes) 5. Secondary Dressing Applied ABD Pad 7. Secured with Tape 4-Layer Compression System - Right Lower Extremity Notes RTD unna to anchor Electronic Signature(s) KYLIAH, SCHENDEL ([id number]) Signed: 03/16/2016 4:35:54 PM By: Alejandro Mulling Entered By: Alejandro Mulling on 03/16/2016 13:08:24 Sheeler, [name] ([id number]) -------------------------------------------------------------------------------- Vitals Details Patient Name: Montanari, Heyli L. Date of Service: 03/16/2016 12:45 PM Medical Record Patient Account Number: [id number] Number: Treating RN: Phillis Haggis 1956-07-31 (59 y.o. Other Clinician: Date of Birth/Sex: Female) Treating ROBSON, MICHAEL Primary Care Physician: PATIENT, NO Physician/Extender: G Referring Physician: Everette Rank in Treatment: 12 Vital Signs Time Taken: 12:57 Temperature (F): 98.1 Height (in): 62 Pulse (bpm): 92 Weight (lbs): 170 Respiratory Rate (breaths/min): 20 Body Mass Index (BMI): 31.1 Blood Pressure (mmHg): 147/77 Reference Range: 80 - 120 mg / dl Electronic  Signature(s) Signed: 03/16/2016 4:35:54 PM By: Alejandro Mulling Entered By: Alejandro Mulling on 03/16/2016 13:00:23

## 2016-03-23 ENCOUNTER — Encounter: Payer: Medicare Other | Admitting: Surgery

## 2016-03-23 DIAGNOSIS — I87331 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 lower extremity: Secondary | ICD-10-CM | POA: Diagnosis not present

## 2016-03-23 NOTE — Progress Notes (Signed)
[name] ([id number]) Visit Report for 03/23/2016 Arrival Information Details Patient Name: [name], [name] Date of Service: 03/23/2016 12:45 PM Medical Record Number: [id number] Patient Account Number: [id number] Date of Birth/Sex: 25-Dec-1955 (60 y.o. Female) Treating RN: Ashok Cordia, Debi Primary Care Physician: PATIENT, NO Other Clinician: Referring Physician: Phineas Semen Treating Physician/Extender: Rudene Re in Treatment: 13 Visit Information History Since Last Visit Added or deleted any medications: No Patient Arrived: Ambulatory Any new allergies or adverse reactions: No Arrival Time: 12:58 Had a fall or experienced change in No Accompanied By: cg activities of daily living that may affect Transfer Assistance: None risk of falls: Patient Identification Verified: Yes Signs or symptoms of abuse/neglect since last No Secondary Verification Process Yes visito Completed: Hospitalized since last visit: No Patient Requires Transmission-Based No Pain Present Now: No Precautions: Patient Has Alerts: No Electronic Signature(s) Signed: 03/23/2016 4:37:03 PM By: Alejandro Mulling Entered By: Alejandro Mulling on 03/23/2016 13:00:06 Lattner, [name] ([id number]) -------------------------------------------------------------------------------- Encounter Discharge Information Details Patient Name: [name], [name] Date of Service: 03/23/2016 12:45 PM Medical Record Number: [id number] Patient Account Number: [id number] Date of Birth/Sex: [birth year] (60 y.o. Female) Treating RN: Ashok Cordia, Debi Primary Care Physician: PATIENT, NO Other Clinician: Referring Physician: Phineas Semen Treating Physician/Extender: Rudene Re in Treatment: 13 Encounter Discharge Information Items Discharge Pain Level: 0 Discharge Condition: Stable Ambulatory Status: Ambulatory Discharge Destination: Home Transportation: Private Auto Accompanied By: caregiver Schedule  Follow-up Appointment: Yes Medication Reconciliation completed and provided to Patient/Care Yes Roi Jafari: Provided on Clinical Summary of Care: 03/23/2016 Form Type Recipient Paper Patient GC Electronic Signature(s) Signed: 03/23/2016 1:39:32 PM By: Gwenlyn Perking Entered By: Gwenlyn Perking on 03/23/2016 13:39:32 Falk, [name] ([id number]) -------------------------------------------------------------------------------- Lower Extremity Assessment Details Patient Name: [name], [name] Date of Service: 03/23/2016 12:45 PM Medical Record Number: [id number] Patient Account Number: [id number] Date of Birth/Sex: Feb 15, 1956 (60 y.o. Female) Treating RN: Ashok Cordia, Debi Primary Care Physician: PATIENT, NO Other Clinician: Referring Physician: Phineas Semen Treating Physician/Extender: Rudene Re in Treatment: 13 Edema Assessment Assessed: [Left: No] [Right: No] E[Left: dema] [Right: :] Calf Left: Right: Point of Measurement: 31 cm From Medial Instep cm 40.6 cm Ankle Left: Right: Point of Measurement: 11 cm From Medial Instep cm 25.7 cm Vascular Assessment Pulses: Posterior Tibial Dorsalis Pedis Palpable: [Right:Yes] Extremity colors, hair growth, and conditions: Extremity Color: [Right:Hyperpigmented] Temperature of Extremity: [Right:Warm] Capillary Refill: [Right:< 3 seconds] Electronic Signature(s) Signed: 03/23/2016 4:37:03 PM By: Alejandro Mulling Entered By: Alejandro Mulling on 03/23/2016 13:05:36 Rabenold, [place] Elbert Ewings ([id number]) -------------------------------------------------------------------------------- Multi Wound Chart Details Patient Name: [name], [name] Date of Service: 03/23/2016 12:45 PM Medical Record Number: [id number] Patient Account Number: [id number] Date of Birth/Sex: 22-Apr-1956 (60 y.o. Female) Treating RN: Ashok Cordia, Debi Primary Care Physician: PATIENT, NO Other Clinician: Referring Physician: Phineas Semen Treating  Physician/Extender: Rudene Re in Treatment: 13 Vital Signs Height(in): 62 Pulse(bpm): 96 Weight(lbs): 170 Blood Pressure 133/79 (mmHg): Body Mass Index(BMI): 31 Temperature(F): 98.2 Respiratory Rate 20 (breaths/min): Photos: [1:No Photos] [N/A:N/A] Wound Location: [1:Right Lower Leg - Medial] [N/A:N/A] Wounding Event: [1:Blister] [N/A:N/A] Primary Etiology: [1:Venous Leg Ulcer] [N/A:N/A] Date Acquired: [1:12/15/2015] [N/A:N/A] Weeks of Treatment: [1:13] [N/A:N/A] Wound Status: [1:Open] [N/A:N/A] Measurements L x W x D 2.5x1.5x0.1 [N/A:N/A] (cm) Area (cm) : [1:2.945] [N/A:N/A] Volume (cm) : [1:0.295] [N/A:N/A] % Reduction in Area: [1:88.60%] [N/A:N/A] % Reduction in Volume: 88.60% [N/A:N/A] Classification: [1:Partial Thickness] [N/A:N/A] Exudate Amount: [1:Large] [N/A:N/A] Exudate Type: [1:Serosanguineous] [N/A:N/A] Exudate Color: [1:red, brown] [N/A:N/A] Wound Margin: [1:Flat and Intact] [N/A:N/A] Granulation Amount: [1:Large (67-100%)] [N/A:N/A]  Granulation Quality: [1:Red, Hyper-granulation, Friable] [N/A:N/A] Necrotic Amount: [1:None Present (0%)] [N/A:N/A] Exposed Structures: [1:Fascia: No Fat: No Tendon: No Muscle: No Joint: No Bone: No Limited to Skin Breakdown] [N/A:N/A] Epithelialization: None N/A N/A Periwound Skin Texture: Edema: Yes N/A N/A Friable: Yes Excoriation: No Induration: No Callus: No Crepitus: No Fluctuance: No Rash: No Scarring: No Periwound Skin Moist: Yes N/A N/A Moisture: Maceration: No Dry/Scaly: No Periwound Skin Color: Hemosiderin Staining: Yes N/A N/A Atrophie Blanche: No Cyanosis: No Ecchymosis: No Erythema: No Mottled: No Pallor: No Rubor: No Temperature: No Abnormality N/A N/A Tenderness on Yes N/A N/A Palpation: Wound Preparation: Ulcer Cleansing: Other: N/A N/A soap and water Topical Anesthetic Applied: Other: lidocaine 4% Treatment Notes Electronic Signature(s) Signed: 03/23/2016 4:37:03 PM By:  Alejandro Mulling Entered By: Alejandro Mulling on 03/23/2016 13:14:26 Marlette, [name] ([id number]) -------------------------------------------------------------------------------- Multi-Disciplinary Care Plan Details Patient Name: [name], [name] Date of Service: 03/23/2016 12:45 PM Medical Record Number: [id number] Patient Account Number: [id number] Date of Birth/Sex: Dec 05, 1955 (60 y.o. Female) Treating RN: Ashok Cordia, Debi Primary Care Physician: PATIENT, NO Other Clinician: Referring Physician: Phineas Semen Treating Physician/Extender: Rudene Re in Treatment: 13 Active Inactive Abuse / Safety / Falls / Self Care Management Nursing Diagnoses: Impaired physical mobility Potential for falls Goals: Patient will remain injury free Date Initiated: 12/23/2015 Goal Status: Active Interventions: Assess fall risk on admission and as needed Notes: Nutrition Nursing Diagnoses: Imbalanced nutrition Goals: Patient/caregiver verbalizes understanding of need to maintain therapeutic glucose control per primary care physician Date Initiated: 12/23/2015 Goal Status: Active Interventions: Provide education on nutrition Treatment Activities: Education provided on Nutrition : 01/21/2016 Notes: Orientation to the Wound Care Program Nursing Diagnoses: DACOTA, FERRAND ([id number]) Knowledge deficit related to the wound healing center program Goals: Patient/caregiver will verbalize understanding of the Wound Healing Center Program Date Initiated: 12/23/2015 Goal Status: Active Interventions: Provide education on orientation to the wound center Notes: Venous Leg Ulcer Nursing Diagnoses: Potential for venous Insuffiency (use before diagnosis confirmed) Goals: Patient will maintain optimal edema control Date Initiated: 12/23/2015 Goal Status: Active Interventions: Provide education on venous insufficiency Notes: Wound/Skin Impairment Nursing Diagnoses: Impaired tissue  integrity Goals: Ulcer/skin breakdown will heal within 14 weeks Date Initiated: 12/23/2015 Goal Status: Active Interventions: Assess ulceration(s) every visit Notes: Electronic Signature(s) Signed: 03/23/2016 4:37:03 PM By: Alejandro Mulling Entered By: Alejandro Mulling on 03/23/2016 13:14:20 Siler, Anabel Elbert Ewings ([id number]) -------------------------------------------------------------------------------- Pain Assessment Details Patient Name: Kelliher, Nashly L. Date of Service: 03/23/2016 12:45 PM Medical Record Number: [id number] Patient Account Number: [id number] Date of Birth/Sex: 1956/06/20 (60 y.o. Female) Treating RN: Ashok Cordia, Debi Primary Care Physician: PATIENT, NO Other Clinician: Referring Physician: Phineas Semen Treating Physician/Extender: Rudene Re in Treatment: 13 Active Problems Location of Pain Severity and Description of Pain Patient Has Paino No Site Locations With Dressing Change: No Pain Management and Medication Current Pain Management: Notes Topical or injectable lidocaine is offered to patient for acute pain when surgical debridement is performed. If needed, Patient is instructed to use over the counter pain medication for the following 24-48 hours after debridement. Wound care MDs do not prescribed pain medications. Patient has chronic pain or uncontrolled pain. Patient has been instructed to make an appointment with their Primary Care Physician for pain management. Electronic Signature(s) Signed: 03/23/2016 4:37:03 PM By: Alejandro Mulling Entered By: Alejandro Mulling on 03/23/2016 13:01:14 Gruen, [name] ([id number]) -------------------------------------------------------------------------------- Patient/Caregiver Education Details Patient Name: Isaacson, [name]. Date of Service: 03/23/2016 12:45 PM Medical Record Number: [id number] Patient Account Number: [id number] Date of Birth/Gender: 07/18/1956 (  60 y.o. Female) Treating RN: Ashok Cordia,  Debi Primary Care Physician: PATIENT, NO Other Clinician: Referring Physician: Phineas Semen Treating Physician/Extender: Rudene Re in Treatment: 13 Education Assessment Education Provided To: Patient Education Topics Provided Wound/Skin Impairment: Handouts: Other: do not get wrap wet Methods: Demonstration, Explain/Verbal Responses: State content correctly Electronic Signature(s) Signed: 03/23/2016 4:37:03 PM By: Alejandro Mulling Entered By: Alejandro Mulling on 03/23/2016 13:15:04 Womac, Ka[place] Elbert Ewings ([id number]) -------------------------------------------------------------------------------- Wound Assessment Details Patient Name: Gazzola, Marlys L. Date of Service: 03/23/2016 12:45 PM Medical Record Number: [id number] Patient Account Number: [id number] Date of Birth/Sex: [birth year] (61 y.o. Female) Treating RN: Ashok Cordia, Debi Primary Care Physician: PATIENT, NO Other Clinician: Referring Physician: Phineas Semen Treating Physician/Extender: Rudene Re in Treatment: 13 Wound Status Wound Number: 1 Primary Etiology: Venous Leg Ulcer Wound Location: Right Lower Leg - Medial Wound Status: Open Wounding Event: Blister Date Acquired: 12/15/2015 Weeks Of Treatment: 13 Clustered Wound: No Photos Photo Uploaded By: Alejandro Mulling on 03/23/2016 16:34:35 Wound Measurements Length: (cm) 2.5 Width: (cm) 1.5 Depth: (cm) 0.1 Area: (cm) 2.945 Volume: (cm) 0.295 % Reduction in Area: 88.6% % Reduction in Volume: 88.6% Epithelialization: None Tunneling: No Undermining: No Wound Description Classification: Partial Thickness Wound Margin: Flat and Intact Exudate Amount: Large Exudate Type: Serosanguineous Exudate Color: red, brown Foul Odor After Cleansing: No Wound Bed Granulation Amount: Large (67-100%) Exposed Structure Granulation Quality: Red, Hyper-granulation, Friable Fascia Exposed: No Necrotic Amount: None Present (0%) Fat Layer Exposed:  No Tendon Exposed: No Maeda, Lulie L. ([id number]) Muscle Exposed: No Joint Exposed: No Bone Exposed: No Limited to Skin Breakdown Periwound Skin Texture Texture Color No Abnormalities Noted: No No Abnormalities Noted: No Callus: No Atrophie Blanche: No Crepitus: No Cyanosis: No Excoriation: No Ecchymosis: No Fluctuance: No Erythema: No Friable: Yes Hemosiderin Staining: Yes Induration: No Mottled: No Localized Edema: Yes Pallor: No Rash: No Rubor: No Scarring: No Temperature / Pain Moisture Temperature: No Abnormality No Abnormalities Noted: No Tenderness on Palpation: Yes Dry / Scaly: No Maceration: No Moist: Yes Wound Preparation Ulcer Cleansing: Other: soap and water, Topical Anesthetic Applied: Other: lidocaine 4%, Treatment Notes Wound #1 (Right, Medial Lower Leg) 1. Cleansed with: Cleanse wound with antibacterial soap and water 3. Peri-wound Care: Moisturizing lotion 4. Dressing Applied: Other dressing (specify in notes) 5. Secondary Dressing Applied ABD Pad 7. Secured with Tape 4-Layer Compression System - Right Lower Extremity Notes Siltec with hydrogel unna to anchor Electronic Signature(s) Signed: 03/23/2016 4:37:03 PM By: Michaelene Song, [name] ([id number]) Entered By: Alejandro Mulling on 03/23/2016 13:09:09 Rodman, Shakhia Elbert Ewings ([id number]) -------------------------------------------------------------------------------- Vitals Details Patient Name: Koopmann, Ziva L. Date of Service: 03/23/2016 12:45 PM Medical Record Number: [id number] Patient Account Number: [id number] Date of Birth/Sex: 01/03/1956 (60 y.o. Female) Treating RN: Ashok Cordia, Debi Primary Care Physician: PATIENT, NO Other Clinician: Referring Physician: Phineas Semen Treating Physician/Extender: Rudene Re in Treatment: 13 Vital Signs Time Taken: 13:01 Temperature (F): 98.2 Height (in): 62 Pulse (bpm): 96 Weight (lbs): 170 Respiratory Rate  (breaths/min): 20 Body Mass Index (BMI): 31.1 Blood Pressure (mmHg): 133/79 Reference Range: 80 - 120 mg / dl Electronic Signature(s) Signed: 03/23/2016 4:37:03 PM By: Alejandro Mulling Entered By: Alejandro Mulling on 03/23/2016 13:04:13

## 2016-03-23 NOTE — Progress Notes (Signed)
TRINYTI, IREDALE ([id number]) Visit Report for 03/23/2016 Chief Complaint Document Details Patient Name: [name], [name] Date of Service: 03/23/2016 12:45 PM Medical Record Number: [id number] Patient Account Number: [id number] Date of Birth/Sex: March 21, 1956 (60 y.o. Female) Treating RN: Ashok [name], Debi Primary Care Physician: PATIENT, NO Other Clinician: Referring Physician: Phineas Semen Treating Physician/Extender: Rudene Re in Treatment: 13 Information Obtained from: Patient Chief Complaint Patient is here for review of a fairly substantial wound on her right medial lower leg that is been present for the last week Electronic Signature(s) Signed: 03/23/2016 1:20:04 PM By: Evlyn Kanner MD, FACS Entered By: Evlyn Kanner on 03/23/2016 13:20:04 [name], [name] ([id number]) -------------------------------------------------------------------------------- HPI Details Patient Name: [name], [name] Date of Service: 03/23/2016 12:45 PM Medical Record Number: [id number] Patient Account Number: [id number] Date of Birth/Sex: 1956/06/07 (60 y.o. Female) Treating RN: Ashok [name], Debi Primary Care Physician: PATIENT, NO Other Clinician: Referring Physician: Phineas Semen Treating Physician/Extender: Rudene Re in Treatment: 13 History of Present Illness HPI Description: 12/23/15; this is a 60 year old lady who recently relocated back to [place] from [place]. She is staying with a family friend previously lived with a daughter in [place]. 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 She is on chronic prednisone she is not a diabetic. Dose of prednisone is 10 mg. The patient tells me that roughly a week ago she developed a fairly substantial blister over this area that then ruptured. She was seen in the emergency room on 5/7 an x-ray of the leg was negative she was put on Septra although I don't think  any cultures were done. She also tells me she has had chronic edema in her legs for a number of years. She had a similar presentation to currently in the right lateral lower leg roughly a year ago that she was able to heal on her own. As noted she has systemic lupus but does not have lupus nephritis according to the patient. She has a lot of edema in her lower legs. The ABI's could not be obtained. 12/31/15; the patient's insurance which is [place] [employer] makes her out of network for any home health therefore we have been changing her dressing in our facility. I reviewed her trip to the ER on 12/21/2015 her creatinine was within the normal range hemoglobin and white count normal. Culture of the wound was negative. X-ray of the leg showed soft tissue edema no foreign bodies. We have been dressing this with Aquacel Ag due to the amount of ongoing drainage 01/07/16; the patient had her arterial studies this morning I don't have these results area she comes back in to have [place] rewrap since she doesn't have access to home health 01/14/16 I still do not have the results of her arterial studies. Our nurse correctly pointed out that we've been wrapping the left leg without an open wound I think this had to do with the fact that she had so much edema when she came in I was concerned about leaving this unwrapped. 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 We left her left leg unwrapped last week as she has no open wound here today she has extensive edema here. We did order stockings however I believe the company called but they have not called them back. She comes back in today with extensive  edema in the left leg. We have her arterial studies which showed triphasic waves and all locations tested including her posterior tibial and anterior tibial arteries bilaterally. Report listed no  hemodynamically significant bilateral lower extremity arterial stenosis. She has her venous studies later this month. I'm going to rewrap the left leg due to the extent of the edema, transitioning her into the stocking we ordered last week as soon as one is available 01/27/16 the patient arrived today with pooled serosanguineous drainage over the wound it would not absorb into her Aquacel Ag which is somewhat on. In spite of this the dimensions of her wound are down considerably and the wound bed looks healthy without any need for debridement. She is obtained her juxtalite stockings the left leg which has no open wound. We are waiting for the right leg wound to get smaller before ordering her one for the right leg 02/03/16; patient wound looked improved. No debridement was required. We've been using the juxtalite Neumeier, Shakiyah [name]. ([id number]) stocking on the left leg which is no open wound. 02/11/16 wound is remarkably better. No debridement was required. Healthy rim of epithelialization. We have been using Aquacel Ag, we'll try to close this down with RTD over the next 2 weeks 02/18/16 wound continues to improve down a centimeter in length and width. No debridement was required. Advancing epithelialization. We started RTD last week 02/25/16: pt brought juxtalite for right lower leg today. nurse reports RTD was dry and adhered to the wound. required removal by NS. wound bed with 100% healthy red granulation tissue. denies systemic s/s of infection. 03/02/16; Considerable improvement. no debridement. 03/09/16; wound bed is very vascular no major change in dimensions. Continuing with RTD 03/16/16; unfortunately no major change in dimensions however the wound bed looks very healthy. I will continue with RTD for one more week. If his stalls consider change to collagen Electronic Signature(s) Signed: 03/23/2016 1:20:08 PM By: Evlyn Kanner MD, FACS Entered By: Evlyn Kanner on 03/23/2016 13:20:08 [name],  [name] ([id number]) -------------------------------------------------------------------------------- Physical Exam Details Patient Name: [name] Date of Service: 03/23/2016 12:45 PM Medical Record Number: [id number] Patient Account Number: [id number] Date of Birth/Sex: [birth year] (60 y.o. Female) Treating RN: Ashok [name], Debi Primary Care Physician: PATIENT, NO Other Clinician: Referring Physician: Phineas Semen Treating Physician/Extender: Rudene Re in Treatment: 13 Constitutional . Pulse regular. Respirations normal and unlabored. Afebrile. . Eyes Nonicteric. Reactive to light. Ears, Nose, Mouth, and Throat Lips, teeth, and gums WNL.Marland Kitchen Moist mucosa without lesions. Neck supple and nontender. No palpable supraclavicular or cervical adenopathy. Normal sized without goiter. Respiratory WNL. No retractions.. Breath sounds WNL, No rubs, rales, rhonchi, or wheeze.. Cardiovascular Heart rhythm and rate regular, no murmur or gallop.. Pedal Pulses WNL. No clubbing, cyanosis or edema. Lymphatic No adneopathy. No adenopathy. No adenopathy. Musculoskeletal Adexa without tenderness or enlargement.. Digits and nails w/o clubbing, cyanosis, infection, petechiae, ischemia, or inflammatory conditions.. Integumentary (Hair, Skin) No suspicious lesions. No crepitus or fluctuance. No peri-wound warmth or erythema. No masses.Marland Kitchen Psychiatric Judgement and insight Intact.. No evidence of depression, anxiety, or agitation.. Notes the base of the wound is clean with healthy granulation tissue but the RTD seems to stick to it and causes supple scar to peel off Electronic Signature(s) Signed: 03/23/2016 1:21:00 PM By: Evlyn Kanner MD, FACS Entered By: Evlyn Kanner on 03/23/2016 13:20:59 [name], [name] ([id number]) -------------------------------------------------------------------------------- Physician Orders Details Patient Name: Vidrine, Havilah [name]. Date of Service:  03/23/2016 12:45 PM Medical Record Number: [id number] Patient Account Number: [id number]  Date of Birth/Sex: 1955/10/06 (60 y.o. Female) Treating RN: Ashok [name], Debi Primary Care Physician: PATIENT, NO Other Clinician: Referring Physician: Phineas Semen Treating Physician/Extender: Rudene Re in Treatment: 33 Verbal / Phone Orders: Yes Clinician: Ashok [name], Debi Read Back and Verified: Yes Diagnosis Coding Wound Cleansing Wound #1 Right,Medial Lower Leg o Cleanse wound with mild soap and water Anesthetic Wound #1 Right,Medial Lower Leg o Topical Lidocaine 4% cream applied to wound bed prior to debridement Primary Wound Dressing Wound #1 Right,Medial Lower Leg o Foam Dressing Change Frequency Wound #1 Right,Medial Lower Leg o Change dressing every week Follow-up Appointments Wound #1 Right,Medial Lower Leg o Return Appointment in 1 week. Edema Control Wound #1 Right,Medial Lower Leg o 4-Layer Compression System - Right Lower Extremity - unna to anchor o Other: - Juxtalite to left leg Additional Orders / Instructions Wound #1 Right,Medial Lower Leg o Increase protein intake. Electronic Signature(s) Signed: 03/23/2016 4:16:34 PM By: Evlyn Kanner MD, FACS Signed: 03/23/2016 4:37:03 PM By: Alejandro Mulling Entered By: Alejandro Mulling on 03/23/2016 13:19:00 Feldmeier, [name] ([id number]) D[place]aire, [name] ([id number]) -------------------------------------------------------------------------------- Problem List Details Patient Name: [name], [name] Date of Service: 03/23/2016 12:45 PM Medical Record Number: [id number] Patient Account Number: [id number] Date of Birth/Sex: [birth year] (60 y.o. 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 Date of Service: 03/23/2016 12:45 PM Medical Record Number: [id number] Patient Account Number: [id number] Date of Birth/Sex: [birth year] (61 y.o. Female) Treating RN: Ashok [name], Debi Primary Care Physician: PATIENT, NO Other Clinician: Referring Physician: Phineas Semen Treating Physician/Extender: Rudene Re in Treatment: 13 Subjective Chief Complaint Information obtained from Patient Patient is here for review of a fairly substantial wound on her right medial lower leg that is been present for the last week History of Present Illness (HPI) 12/23/15; this is a 60 year old lady who recently relocated back to [place] from [place]. She is staying with a family friend previously lived with a daughter in [place]. 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 She is on chronic prednisone she is not a diabetic. Dose of prednisone is 10 mg. The patient tells me that roughly a week ago she developed a fairly substantial blister over this area that then ruptured. She was seen in the emergency room on 5/7 an x-ray of the leg was negative she was put on Septra although I don't think  any cultures were done. She also tells me she has had chronic edema in her legs for a number of years. She had a similar presentation to currently in the right lateral lower leg roughly a year ago that she was able to heal on her own. As noted she has systemic lupus but does not have lupus nephritis according to the patient. She has a lot of edema in her lower  legs. The ABI's could not be obtained. 12/31/15; the patient's insurance which is [place] [employer] makes her out of network for any home health therefore we have been changing her dressing in our facility. I reviewed her trip to the ER on 12/21/2015 her creatinine was within the normal range hemoglobin and white count normal. Culture of the wound was negative. X-ray of the leg showed soft tissue edema no foreign bodies. We have been dressing this with Aquacel Ag due to the amount of ongoing drainage 01/07/16; the patient had her arterial studies this morning I don't have these results area she comes back in to have [place] rewrap since she doesn't have access to home health 01/14/16 I still do not have the results of her arterial studies. Our nurse correctly pointed out that we've been wrapping the left leg without an open wound I think this had to do with the fact that she had so much edema when she came in I was concerned about leaving this unwrapped. 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 We left her left leg unwrapped last week as she has no open wound here today she has extensive edema here. We did order stockings however I believe the company called but they have not called them back. She comes back in today with extensive edema in the left leg. We have her arterial studies which showed triphasic waves and all locations tested including her posterior tibial and anterior tibial arteries bilaterally. Report listed no  hemodynamically significant bilateral lower extremity arterial stenosis. She has her venous studies later this month. I'm going to rewrap the left leg due to the extent of the edema, [name], [name] ([id number]) transitioning her into the stocking we ordered last week as soon as one is available 01/27/16 the patient arrived today with pooled serosanguineous drainage over the wound it would not absorb into her Aquacel Ag which is somewhat on. In spite of this the dimensions of her wound are down considerably and the wound bed looks healthy without any need for debridement. She is obtained her juxtalite stockings the left leg which has no open wound. We are waiting for the right leg wound to get smaller before ordering her one for the right leg 02/03/16; patient wound looked improved. No debridement was required. We've been using the juxtalite stocking on the left leg which is no open wound. 02/11/16 wound is remarkably better. No debridement was required. Healthy rim of epithelialization. We have been using Aquacel Ag, we'll try to close this down with RTD over the next 2 weeks 02/18/16 wound continues to improve down a centimeter in length and width. No debridement was required. Advancing epithelialization. We started RTD last week 02/25/16: pt brought juxtalite for right lower leg today. nurse reports RTD was dry and adhered to the wound. required removal by NS. wound bed with 100% healthy red granulation tissue. denies systemic s/s of infection. 03/02/16; Considerable improvement. no debridement. 03/09/16; wound bed is very vascular no major change in dimensions. Continuing with RTD 03/16/16; unfortunately no major change in dimensions however the wound bed looks very healthy. I will continue with RTD for one more week. If his stalls consider change to collagen Objective Constitutional Pulse regular. Respirations normal and unlabored. Afebrile. Vitals Time Taken: 1:01 PM, Height: 62 in, Weight:  170 lbs, BMI: 31.1, Temperature: 98.2 F, Pulse: 96 bpm, Respiratory Rate:  20 breaths/min, Blood Pressure: 133/79 mmHg. Eyes Nonicteric. Reactive to light. Ears, Nose, Mouth, and Throat Lips, teeth, and gums WNL.Marland Kitchen Moist mucosa without lesions. Neck supple and nontender. No palpable supraclavicular or cervical adenopathy. Normal sized without goiter. Respiratory WNL. No retractions.. Breath sounds WNL, No rubs, rales, rhonchi, or wheeze.. Cardiovascular Heart rhythm and rate regular, no murmur or gallop.. Pedal Pulses WNL. No clubbing, cyanosis or edema. Lymphatic [name], [name] ([id number]) No adneopathy. No adenopathy. No adenopathy. Musculoskeletal Adexa without tenderness or enlargement.. Digits and nails w/o clubbing, cyanosis, infection, petechiae, ischemia, or inflammatory conditions.Marland Kitchen Psychiatric Judgement and insight Intact.. No evidence of depression, anxiety, or agitation.. General Notes: the base of the wound is clean with healthy granulation tissue but the RTD seems to stick to it and causes supple scar to peel off Integumentary (Hair, Skin) No suspicious lesions. No crepitus or fluctuance. No peri-wound warmth or erythema. No masses.. Wound #1 status is Open. Original cause of wound was Blister. The wound is located on the Right,Medial Lower Leg. The wound measures 2.5cm length x 1.5cm width x 0.1cm depth; 2.945cm^2 area and 0.295cm^3 volume. The wound is limited to skin breakdown. There is no tunneling or undermining noted. There is a large amount of serosanguineous drainage noted. The wound margin is flat and intact. There is large (67-100%) red, friable granulation within the wound bed. There is no necrotic tissue within the wound bed. The periwound skin appearance exhibited: Friable, Localized Edema, Moist, Hemosiderin Staining. The periwound skin appearance did not exhibit: Callus, Crepitus, Excoriation, Fluctuance, Induration, Rash, Scarring, Dry/Scaly,  Maceration, Atrophie Blanche, Cyanosis, Ecchymosis, Mottled, Pallor, Rubor, Erythema. Periwound temperature was noted as No Abnormality. The periwound has tenderness on palpation. Assessment Active Problems ICD-10 I87.331 - 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 lower extremity I87.322 - Chronic venous hypertension (idiopathic) with inflammation of left lower extremity M32.10 - Systemic lupus erythematosus, organ or system involvement unspecified L97.211 - Non-pressure chronic ulcer of right calf limited to breakdown of skin Plan Wound Cleansing: Wound #1 Right,Medial Lower Leg: Cleanse wound with mild soap and water [name], [name] ([id number]) Anesthetic: Wound #1 Right,Medial Lower Leg: Topical Lidocaine 4% cream applied to wound bed prior to debridement Primary Wound Dressing: Wound #1 Right,Medial Lower Leg: Foam Dressing Change Frequency: Wound #1 Right,Medial Lower Leg: Change dressing every week Follow-up Appointments: Wound #1 Right,Medial Lower Leg: Return Appointment in 1 week. Edema Control: Wound #1 Right,Medial Lower Leg: 4-Layer Compression System - Right Lower Extremity - unna to anchor Other: - Juxtalite to left leg Additional Orders / Instructions: Wound #1 Right,Medial Lower Leg: Increase protein intake. I have recommended we use a piece of Siltec Sorbact foam over the raw surface of the wound and then use a 4-layer Profore compression wrap. She is using her juxta light compression on her left lower extremity. Electronic Signature(s) Signed: 03/23/2016 1:22:03 PM By: Evlyn Kanner MD, FACS Entered By: Evlyn Kanner on 03/23/2016 13:22:03 [name], [name] ([id number]) -------------------------------------------------------------------------------- SuperBill Details Patient Name: [name], [name] Date of Service: 03/23/2016 Medical Record Number: [id number] Patient Account Number: [id number] Date of Birth/Sex:  July 17, 1956 (60 y.o. Female) Treating RN: Ashok [name], Debi Primary Care Physician: PATIENT, NO Other Clinician: Referring Physician: Phineas Semen Treating Physician/Extender: Rudene Re in Treatment: 13 Diagnosis Coding ICD-10 Codes Code Description 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 lower I87.331 extremity I87.322 Chronic venous hypertension (idiopathic) with inflammation of left lower extremity M32.10 Systemic lupus erythematosus, organ or system involvement unspecified L97.211  Non-pressure chronic ulcer of right calf limited to breakdown of skin Facility Procedures CPT4: Description Modifier Quantity Code [id number] (Facility Use Only) [phone] - APPLY MULTLAY COMPRS LWR RT 1 LEG Physician Procedures CPT4: Description Modifier Quantity Code [id number] - WC PHYS LEVEL 3 - EST PT 1 ICD-10 Description Diagnosis I87.331 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 lower extremity I87.322 Chronic venous hypertension  (idiopathic) with inflammation of left lower extremity M32.10 Systemic lupus erythematosus, organ or system involvement unspecified L97.211 Non-pressure chronic ulcer of right calf limited to breakdown of skin Electronic Signature(s) Signed: 03/23/2016 1:22:33 PM By: Evlyn Kanner MD, FACS Entered By: Evlyn Kanner on 03/23/2016 13:22:33

## 2016-03-30 ENCOUNTER — Other Ambulatory Visit: Payer: Self-pay | Admitting: Internal Medicine

## 2016-03-30 ENCOUNTER — Encounter: Payer: Medicare Other | Admitting: Surgery

## 2016-03-30 DIAGNOSIS — R601 Generalized edema: Secondary | ICD-10-CM

## 2016-03-30 DIAGNOSIS — I87331 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 lower extremity: Secondary | ICD-10-CM | POA: Diagnosis not present

## 2016-03-30 NOTE — Progress Notes (Addendum)
ELLIVIA, POMMIER ([id number]) Visit Report for 03/30/2016 Chief Complaint Document Details Patient Name: [name], [name] Date of Service: 03/30/2016 11:30 AM Medical Record Number: [id number] Patient Account Number: [id number] Date of Birth/Sex: 09/10/1955 (60 y.o. Female) Treating RN: Huel Coventry Primary Care Physician: PATIENT, NO Other Clinician: Referring Physician: Phineas Semen Treating Physician/Extender: Rudene Re in Treatment: 14 Information Obtained from: Patient Chief Complaint Patient is here for review of a fairly substantial wound on her right medial lower leg that is been present for the last week Electronic Signature(s) Signed: 03/30/2016 12:07:43 PM By: Evlyn Kanner MD, FACS Entered By: Evlyn Kanner on 03/30/2016 12:07:42 Aguayo, [name] ([id number]) -------------------------------------------------------------------------------- HPI Details Patient Name: Djordjevic, [name] Date of Service: 03/30/2016 11:30 AM Medical Record Number: [id number] Patient Account Number: [id number] Date of Birth/Sex: 01-17-1956 (60 y.o. Female) Treating RN: Huel Coventry Primary Care Physician: PATIENT, NO Other Clinician: Referring Physician: Phineas Semen Treating Physician/Extender: Rudene Re in Treatment: 14 History of Present Illness HPI Description: 12/23/15; this is a 60 year old lady who recently relocated back to [place] from [place]. She is staying with a family friend previously lived with a daughter in [place]. 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 She is on chronic prednisone she is not a diabetic. Dose of prednisone is 10 mg. The patient tells me that roughly a week ago she developed a fairly substantial blister over this area that then ruptured. She was seen in the emergency room on 5/7 an x-ray of the leg was negative she was put on Septra although I don't think any  cultures were done. She also tells me she has had chronic edema in her legs for a number of years. She had a similar presentation to currently in the right lateral lower leg roughly a year ago that she was able to heal on her own. As noted she has systemic lupus but does not have lupus nephritis according to the patient. She has a lot of edema in her lower legs. The ABI's could not be obtained. 12/31/15; the patient's insurance which is [place] [employer] makes her out of network for any home health therefore we have been changing her dressing in our facility. I reviewed her trip to the ER on 12/21/2015 her creatinine was within the normal range hemoglobin and white count normal. Culture of the wound was negative. X-ray of the leg showed soft tissue edema no foreign bodies. We have been dressing this with Aquacel Ag due to the amount of ongoing drainage 01/07/16; the patient had her arterial studies this morning I don't have these results area she comes back in to have [place] rewrap since she doesn't have access to home health 01/14/16 I still do not have the results of her arterial studies. Our nurse correctly pointed out that we've been wrapping the left leg without an open wound I think this had to do with the fact that she had so much edema when she came in I was concerned about leaving this unwrapped. 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 We left her left leg unwrapped last week as she has no open wound here today she has extensive edema here. We did order stockings however I believe the company called but they have not called them back. She comes back in today with extensive  edema in the left leg. We have her arterial studies which showed triphasic waves and all locations tested including her posterior tibial and anterior tibial arteries bilaterally. Report listed no  hemodynamically significant bilateral lower extremity arterial stenosis. She has her venous studies later this month. I'm going to rewrap the left leg due to the extent of the edema, transitioning her into the stocking we ordered last week as soon as one is available 01/27/16 the patient arrived today with pooled serosanguineous drainage over the wound it would not absorb into her Aquacel Ag which is somewhat on. In spite of this the dimensions of her wound are down considerably and the wound bed looks healthy without any need for debridement. She is obtained her juxtalite stockings the left leg which has no open wound. We are waiting for the right leg wound to get smaller before ordering her one for the right leg 02/03/16; patient wound looked improved. No debridement was required. We've been using the juxtalite Bula, Jaimie L. ([id number]) stocking on the left leg which is no open wound. 02/11/16 wound is remarkably better. No debridement was required. Healthy rim of epithelialization. We have been using Aquacel Ag, we'll try to close this down with RTD over the next 2 weeks 02/18/16 wound continues to improve down a centimeter in length and width. No debridement was required. Advancing epithelialization. We started RTD last week 02/25/16: pt brought juxtalite for right lower leg today. nurse reports RTD was dry and adhered to the wound. required removal by NS. wound bed with 100% healthy red granulation tissue. denies systemic s/s of infection. 03/02/16; Considerable improvement. no debridement. 03/09/16; wound bed is very vascular no major change in dimensions. Continuing with RTD 03/16/16; unfortunately no major change in dimensions however the wound bed looks very healthy. I will continue with RTD for one more week. If his stalls consider change to collagen. 03/30/2016 -- she's been using juxta lites on her left lower extremity but her lymphedema is very significantly increased and she is not  tolerating the juxta lites the edema has increased symptoms significantly Electronic Signature(s) Signed: 03/30/2016 12:08:27 PM By: Evlyn Kanner MD, FACS Entered By: Evlyn Kanner on 03/30/2016 12:08:27 Eckenrode, [name] ([id number]) -------------------------------------------------------------------------------- Physical Exam Details Patient Name: Hartlage, Buffie L. Date of Service: 03/30/2016 11:30 AM Medical Record Number: [id number] Patient Account Number: [id number] Date of Birth/Sex: [birth year] (60 y.o. Female) Treating RN: Huel Coventry Primary Care Physician: PATIENT, NO Other Clinician: Referring Physician: Phineas Semen Treating Physician/Extender: Rudene Re in Treatment: 14 Constitutional . Pulse regular. Respirations normal and unlabored. Afebrile. . Eyes Nonicteric. Reactive to light. Ears, Nose, Mouth, and Throat Lips, teeth, and gums WNL.Marland Kitchen Moist mucosa without lesions. Neck supple and nontender. No palpable supraclavicular or cervical adenopathy. Normal sized without goiter. Respiratory WNL. No retractions.. Breath sounds WNL, No rubs, rales, rhonchi, or wheeze.. Cardiovascular Heart rhythm and rate regular, no murmur or gallop.. Pedal Pulses WNL. No clubbing, cyanosis or edema. Chest Breasts symmetical and no nipple discharge.. Breast tissue WNL, no masses, lumps, or tenderness.. Lymphatic No adneopathy. No adenopathy. No adenopathy. Musculoskeletal Adexa without tenderness or enlargement.. Digits and nails w/o clubbing, cyanosis, infection, petechiae, ischemia, or inflammatory conditions.. Integumentary (Hair, Skin) No suspicious lesions. No crepitus or fluctuance. No peri-wound warmth or erythema. No masses.Marland Kitchen Psychiatric Judgement and insight Intact.. No evidence of depression, anxiety, or agitation.. Notes left lower extremity has significant stage II lymphedema but no open ulcerations.. The right lower extremity has good resolution of her edema  and  the wound is looking very clean and granulating well. No sharp debridement is required today. Electronic Signature(s) Signed: 03/30/2016 12:09:32 PM By: Evlyn Kanner MD, FACS Entered By: Evlyn Kanner on 03/30/2016 12:09:32 [name] ([id number]) -------------------------------------------------------------------------------- Physician Orders Details Patient Name: Loyd, Claudene L. Date of Service: 03/30/2016 11:30 AM Medical Record Number: [id number] Patient Account Number: [id number] Date of Birth/Sex: [birth year] (60 y.o. Female) Treating RN: Huel Coventry Primary Care Physician: PATIENT, NO Other Clinician: Referring Physician: Phineas Semen Treating Physician/Extender: Rudene Re in Treatment: 26 Verbal / Phone Orders: No Diagnosis Coding ICD-10 Coding Code Description 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 lower I87.331 extremity I87.322 Chronic venous hypertension (idiopathic) with inflammation of left lower extremity M32.10 Systemic lupus erythematosus, organ or system involvement unspecified L97.211 Non-pressure chronic ulcer of right calf limited to breakdown of skin Wound Cleansing Wound #1 Right,Medial Lower Leg o Cleanse wound with mild soap and water Skin Barriers/Peri-Wound Care Wound #1 Right,Medial Lower Leg o Moisturizing lotion Primary Wound Dressing Wound #1 Right,Medial Lower Leg o Cutimed Siltec Secondary Dressing Wound #1 Right,Medial Lower Leg o ABD pad Dressing Change Frequency Wound #1 Right,Medial Lower Leg o Change dressing every week Follow-up Appointments Wound #1 Right,Medial Lower Leg o Return Appointment in 1 week. Edema Control Wound #1 Right,Medial Lower Leg Vogler, Taren L. ([id number]) o 3 Layer Compression System - Left Lower Extremity - for swelling o 4-Layer Compression System - Right Lower Extremity - unna to anchor Additional Orders /  Instructions Wound #1 Right,Medial Lower Leg o Increase protein intake. Electronic Signature(s) Signed: 03/30/2016 12:12:38 PM By: Evlyn Kanner MD, FACS Signed: 04/01/2016 5:06:33 PM By: Elliot Gurney RN, [name], [name], [name] Entered By: Elliot Gurney, RN, [name], Kim on 03/30/2016 12:11:47 Sox, [name] ([id number]) -------------------------------------------------------------------------------- Problem List Details Patient Name: Escoto, Kachina L. Date of Service: 03/30/2016 11:30 AM Medical Record Number: [id number] Patient Account Number: [id number] Date of Birth/Sex: 03/03/1956 (60 y.o. Female) Treating RN: Huel Coventry Primary Care Physician: PATIENT, NO Other Clinician: Referring Physician: Phineas Semen Treating Physician/Extender: Rudene Re in Treatment: 14 Active Problems ICD-10 Encounter Code Description Active Date Diagnosis I87.331 Chronic venous hypertension (idiopathic) with ulcer and 12/23/2015 Yes inflammation of right lower extremity I87.322 Chronic venous hypertension (idiopathic) with 12/23/2015 Yes inflammation of left lower extremity M32.10 Systemic lupus erythematosus, organ or system 12/23/2015 Yes involvement unspecified L97.211 Non-pressure chronic ulcer of right calf limited to 03/02/2016 Yes breakdown of skin Inactive Problems Resolved Problems Electronic Signature(s) Signed: 03/30/2016 12:07:34 PM By: Evlyn Kanner MD, FACS Entered By: Evlyn Kanner on 03/30/2016 12:07:34 Elson, [name] ([id number]) -------------------------------------------------------------------------------- Progress Note Details Patient Name: Canavan, Daveigh L. Date of Service: 03/30/2016 11:30 AM Medical Record Number: [id number] Patient Account Number: [id number] Date of Birth/Sex: 1955/12/09 (60 y.o. Female) Treating RN: Huel Coventry Primary Care Physician: PATIENT, NO Other Clinician: Referring Physician: Phineas Semen Treating Physician/Extender: Rudene Re in  Treatment: 14 Subjective Chief Complaint Information obtained from Patient Patient is here for review of a fairly substantial wound on her right medial lower leg that is been present for the last week History of Present Illness (HPI) 12/23/15; this is a 60 year old lady who recently relocated back to [place] from [place]. She is staying with a family friend previously lived with a daughter in [place]. 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 She is on chronic prednisone she is not a diabetic. Dose of prednisone is 10 mg. The patient  tells me that roughly a week ago she developed a fairly substantial blister over this area that then ruptured. She was seen in the emergency room on 5/7 an x-ray of the leg was negative she was put on Septra although I don't think any cultures were done. She also tells me she has had chronic edema in her legs for a number of years. She had a similar presentation to currently in the right lateral lower leg roughly a year ago that she was able to heal on her own. As noted she has systemic lupus but does not have lupus nephritis according to the patient. She has a lot of edema in her lower legs. The ABI's could not be obtained. 12/31/15; the patient's insurance which is [place] [employer] makes her out of network for any home health therefore we have been changing her dressing in our facility. I reviewed her trip to the ER on 12/21/2015 her creatinine was within the normal range hemoglobin and white count normal. Culture of the wound was negative. X-ray of the leg showed soft tissue edema no foreign bodies. We have been dressing this with Aquacel Ag due to the amount of ongoing drainage 01/07/16; the patient had her arterial studies this morning I don't have these results area she comes back in to have [place] rewrap since she doesn't have access to home health 01/14/16 I still do not have the results of her  arterial studies. Our nurse correctly pointed out that we've been wrapping the left leg without an open wound I think this had to do with the fact that she had so much edema when she came in I was concerned about leaving this unwrapped. 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 We left her left leg unwrapped last week as she has no open wound here today she has extensive edema here. We did order stockings however I believe the company called but they have not called them back. She comes back in today with extensive edema in the left leg. We have her arterial studies which showed triphasic waves and all locations tested including her posterior tibial and anterior tibial arteries bilaterally. Report listed no hemodynamically significant bilateral lower extremity arterial stenosis. She has her venous studies later this month. I'm going to rewrap the left leg due to the extent of the edema, Kingma, Rosette L. ([id number]) transitioning her into the stocking we ordered last week as soon as one is available 01/27/16 the patient arrived today with pooled serosanguineous drainage over the wound it would not absorb into her Aquacel Ag which is somewhat on. In spite of this the dimensions of her wound are down considerably and the wound bed looks healthy without any need for debridement. She is obtained her juxtalite stockings the left leg which has no open wound. We are waiting for the right leg wound to get smaller before ordering her one for the right leg 02/03/16; patient wound looked improved. No debridement was required. We've been using the juxtalite stocking on the left leg which is no open wound. 02/11/16 wound is remarkably better. No debridement was required. Healthy rim of epithelialization. We have been using Aquacel Ag, we'll try to close this down with RTD over the  next 2 weeks 02/18/16 wound continues to improve down a centimeter in length and width. No debridement was required. Advancing epithelialization. We started RTD last  week 02/25/16: pt brought juxtalite for right lower leg today. nurse reports RTD was dry and adhered to the wound. required removal by NS. wound bed with 100% healthy red granulation tissue. denies systemic s/s of infection. 03/02/16; Considerable improvement. no debridement. 03/09/16; wound bed is very vascular no major change in dimensions. Continuing with RTD 03/16/16; unfortunately no major change in dimensions however the wound bed looks very healthy. I will continue with RTD for one more week. If his stalls consider change to collagen. 03/30/2016 -- she's been using juxta lites on her left lower extremity but her lymphedema is very significantly increased and she is not tolerating the juxta lites the edema has increased symptoms significantly Objective Constitutional Pulse regular. Respirations normal and unlabored. Afebrile. Vitals Time Taken: 11:25 AM, Height: 62 in, Weight: 170 lbs, BMI: 31.1, Temperature: 98.0 F, Pulse: 96 bpm, Respiratory Rate: 20 breaths/min, Blood Pressure: 157/87 mmHg. Eyes Nonicteric. Reactive to light. Ears, Nose, Mouth, and Throat Lips, teeth, and gums WNL.Marland Kitchen Moist mucosa without lesions. Neck supple and nontender. No palpable supraclavicular or cervical adenopathy. Normal sized without goiter. Respiratory WNL. No retractions.. Breath sounds WNL, No rubs, rales, rhonchi, or wheeze.Marland Kitchen CAROLYNNE, SCHUCHARD ([id number]) Cardiovascular Heart rhythm and rate regular, no murmur or gallop.. Pedal Pulses WNL. No clubbing, cyanosis or edema. Chest Breasts symmetical and no nipple discharge.. Breast tissue WNL, no masses, lumps, or tenderness.. Lymphatic No adneopathy. No adenopathy. No adenopathy. Musculoskeletal Adexa without tenderness or enlargement.. Digits and nails w/o clubbing, cyanosis,  infection, petechiae, ischemia, or inflammatory conditions.Marland Kitchen Psychiatric Judgement and insight Intact.. No evidence of depression, anxiety, or agitation.. General Notes: left lower extremity has significant stage II lymphedema but no open ulcerations.. The right lower extremity has good resolution of her edema and the wound is looking very clean and granulating well. No sharp debridement is required today. Integumentary (Hair, Skin) No suspicious lesions. No crepitus or fluctuance. No peri-wound warmth or erythema. No masses.. Wound #1 status is Open. Original cause of wound was Blister. The wound is located on the Right,Medial Lower Leg. The wound measures 1.2cm length x 1cm width x 0.1cm depth; 0.942cm^2 area and 0.094cm^3 volume. The wound is limited to skin breakdown. There is a large amount of serosanguineous drainage noted. The wound margin is flat and intact. There is large (67-100%) red, friable granulation within the wound bed. There is no necrotic tissue within the wound bed. The periwound skin appearance exhibited: Friable, Moist, Hemosiderin Staining. The periwound skin appearance did not exhibit: Callus, Crepitus, Excoriation, Fluctuance, Induration, Localized Edema, Rash, Scarring, Dry/Scaly, Maceration, Atrophie Blanche, Cyanosis, Ecchymosis, Mottled, Pallor, Rubor, Erythema. Periwound temperature was noted as No Abnormality. The periwound has tenderness on palpation. Assessment Active Problems ICD-10 I87.331 - 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 lower extremity I87.322 - Chronic venous hypertension (idiopathic) with inflammation of left lower extremity M32.10 - Systemic lupus erythematosus, organ or system involvement unspecified L97.211 - Non-pressure chronic ulcer of right calf limited to breakdown of skin Brune, Jamaria L. ([id number]) Plan Wound Cleansing: Wound #1 Right,Medial Lower Leg: Cleanse wound with mild soap and  water Skin Barriers/Peri-Wound Care: Wound #1 Right,Medial Lower Leg: Moisturizing lotion Primary Wound Dressing: Wound #1 Right,Medial Lower Leg: Cutimed Siltec Secondary Dressing: Wound #1 Right,Medial Lower Leg: ABD pad Dressing Change Frequency: Wound #1 Right,Medial Lower Leg: Change dressing every week Follow-up Appointments: Wound #1 Right,Medial Lower Leg: Return Appointment in 1 week. Edema Control: Wound #1 Right,Medial Lower Leg: 3 Layer Compression System - Left Lower  Extremity - for swelling 4-Layer Compression System - Right Lower Extremity - unna to anchor Additional Orders / Instructions: Wound #1 Right,Medial Lower Leg: Increase protein intake. I have recommended we use a piece of Siltec Sorbact foam over the raw surface of the wound on the right leg and then use a 4-layer Profore compression wrap. She is using her juxta light compression on her left lower extremity and this is definitely adequate. She has tolerated a 4-layer compression in the past and I will start off today with a 3 layer compression. If this lymphedema persist she may benefit from lymphedema pumps. Electronic Signature(s) Signed: 03/30/2016 5:16:55 PM By: Evlyn KannerBritto, Emeric Novinger MD, FACS Previous Signature: 03/30/2016 12:11:00 PM Version By: Evlyn KannerBritto, Mikhala Kenan MD, FACS Finnie, [name] ([id number]) Entered By: Evlyn KannerBritto, Tonni Mansour on 03/30/2016 17:16:55 Oppedisano, [name] ([id number]) -------------------------------------------------------------------------------- SuperBill Details Patient Name: Dommer, Ladora L. Date of Service: 03/30/2016 Medical Record Number: [id number] Patient Account Number: [id number] Date of Birth/Sex: 02/08/1956 (60 y.o. Female) Treating RN: Huel CoventryWoody, Kim Primary Care Physician: PATIENT, NO Other Clinician: Referring Physician: Phineas SemenGOODMAN, GRAYDON Treating Physician/Extender: Rudene ReBritto, Cyd Hostler Weeks in Treatment: 14 Diagnosis Coding ICD-10 Codes Code Description Chronic venous hypertension  (idiopathic) with ulcer and inflammation of right lower I87.331 extremity I87.322 Chronic venous hypertension (idiopathic) with inflammation of left lower extremity M32.10 Systemic lupus erythematosus, organ or system involvement unspecified L97.211 Non-pressure chronic ulcer of right calf limited to breakdown of skin Facility Procedures CPT4: Description Modifier Quantity Code [id number] BILATERAL: Application of multi-layer venous compression 1 system; leg (below knee), including ankle and foot. Physician Procedures CPT4: Description Modifier Quantity Code [id number] 99213 - WC PHYS LEVEL 3 - EST PT 1 ICD-10 Description Diagnosis I87.331 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 lower extremity I87.322 Chronic venous hypertension  (idiopathic) with inflammation of left lower extremity M32.10 Systemic lupus erythematosus, organ or system involvement unspecified L97.211 Non-pressure chronic ulcer of right calf limited to breakdown of skin Electronic Signature(s) Signed: 03/30/2016 12:12:38 PM By: Evlyn KannerBritto, Yaileen Hofferber MD, FACS Signed: 04/01/2016 5:06:33 PM By: Elliot GurneyWoody, RN, [name], [name], [name] Previous Signature: 03/30/2016 12:11:15 PM Version By: Evlyn KannerBritto, Anetria Harwick MD, FACS Entered By: Elliot GurneyWoody, RN, [name], Kim on 03/30/2016 12:12:22

## 2016-04-02 NOTE — Progress Notes (Signed)
LAURENDA, STEPLER ([id number]) Visit Report for 03/30/2016 Arrival Information Details Patient Name: [name], [name] Date of Service: 03/30/2016 11:30 AM Medical Record Number: [id number] Patient Account Number: [id number] Date of Birth/Sex: 25-Aug-1955 (60 y.o. Female) Treating RN: Huel Coventry Primary Care Physician: PATIENT, NO Other Clinician: Referring Physician: Phineas Semen Treating Physician/Extender: Rudene Re in Treatment: 14 Visit Information History Since Last Visit Added or deleted any medications: No Patient Arrived: Ambulatory Any new allergies or adverse reactions: No Arrival Time: 11:24 Had a fall or experienced change in No Accompanied By: son activities of daily living that may affect Transfer Assistance: Manual risk of falls: Patient Identification Verified: Yes Signs or symptoms of abuse/neglect since last No Secondary Verification Process Yes visito Completed: Hospitalized since last visit: No Patient Requires Transmission-Based No Has Dressing in Place as Prescribed: Yes Precautions: Has Compression in Place as Prescribed: Yes Patient Has Alerts: No Pain Present Now: No Electronic Signature(s) Signed: 04/01/2016 5:06:33 PM By: Elliot Gurney, RN, [name], [name], [name] Entered By: Elliot Gurney, RN, [name], Kim on 03/30/2016 11:25:06 [name] ([id number]) -------------------------------------------------------------------------------- Encounter Discharge Information Details Patient Name: [name], [name] Date of Service: 03/30/2016 11:30 AM Medical Record Number: [id number] Patient Account Number: [id number] Date of Birth/Sex: [birth year] (60 y.o. Female) Treating RN: Huel Coventry Primary Care Physician: PATIENT, NO Other Clinician: Referring Physician: Phineas Semen Treating Physician/Extender: Rudene Re in Treatment: 14 Encounter Discharge Information Items Discharge Pain Level: 0 Discharge Condition: Stable Ambulatory Status:  Ambulatory Discharge Destination: Home Transportation: Private Auto Accompanied By: self Schedule Follow-up Appointment: Yes Medication Reconciliation completed and provided to Patient/Care Yes Jacquelynne Guedes: Provided on Clinical Summary of Care: 03/30/2016 Form Type Recipient Paper Patient GC Electronic Signature(s) Signed: 04/01/2016 5:06:33 PM By: Elliot Gurney RN, [name], [name], [name] Previous Signature: 03/30/2016 12:12:13 PM Version By: Gwenlyn Perking Entered By: Elliot Gurney RN, [name], Kim on 03/30/2016 12:16:44 [name], [name] ([id number]) -------------------------------------------------------------------------------- General Visit Notes Details Patient Name: Roehr, [name] Date of Service: 03/30/2016 11:30 AM Medical Record Number: [id number] Patient Account Number: [id number] Date of Birth/Sex: 25-Aug-1955 (60 y.o. Female) Treating RN: Huel Coventry Primary Care Physician: PATIENT, NO Other Clinician: Referring Physician: Phineas Semen Treating Physician/Extender: Rudene Re in Treatment: 14 Notes Patient comes in today with left leg lower edema +3. Her leg is large. Patients states there is no pain or discomfort, just swelling. ABI 1.38 MD notified. Per MD since patient has tolerated 4 layer wrap in the past, we have placed patient in 3 layer wrap today to try to get her swelling under control. Electronic Signature(s) Signed: 04/01/2016 5:06:33 PM By: Elliot Gurney, RN, [name], [name], [name] Entered By: Elliot Gurney, RN, [name], Kim on 03/30/2016 12:15:45 [name] ([id number]) -------------------------------------------------------------------------------- Lower Extremity Assessment Details Patient Name: Bouse, Aleina L. Date of Service: 03/30/2016 11:30 AM Medical Record Number: [id number] Patient Account Number: [id number] Date of Birth/Sex: 1955-10-08 (60 y.o. Female) Treating RN: Huel Coventry Primary Care Physician: PATIENT, NO Other Clinician: Referring Physician: Phineas Semen Treating Physician/Extender: Rudene Re in Treatment: 14 Edema Assessment Assessed: [Left: No] [Right: No] E[Left: dema] [Right: :] Calf Left: Right: Point of Measurement: 31 cm From Medial Instep 47 cm 38.5 cm Ankle Left: Right: Point of Measurement: 11 cm From Medial Instep 35.5 cm 25 cm Vascular Assessment Pulses: Posterior Tibial Dorsalis Pedis Palpable: [Left:Yes] [Right:Yes] Extremity colors, hair growth, and conditions: Extremity Color: [Left:Hyperpigmented] [Right:Hyperpigmented] Hair Growth on Extremity: [Left:No] [Right:No] Temperature of Extremity: [Left:Warm] Capillary Refill: [Left:< 3 seconds] [Right:< 3 seconds] Blood Pressure: Brachial: [Left:130]  Dorsalis Pedis: 80 [Left:Dorsalis Pedis:] Ankle: Posterior Tibial: 180 [Left:Posterior Tibial: 1.38] Toe Nail Assessment Left: Right: Thick: No No Discolored: No No Deformed: No No Improper Length and Hygiene: No No Electronic Signature(s) Signed: 04/01/2016 5:06:33 PM By: Elliot Gurney, RN, [name], [name], [name] [name] ([id number]) Entered By: Elliot Gurney, RN, [name], Kim on 03/30/2016 11:44:45 [name] ([id number]) -------------------------------------------------------------------------------- Multi Wound Chart Details Patient Name: Victorio, [name] Date of Service: 03/30/2016 11:30 AM Medical Record Number: [id number] Patient Account Number: [id number] Date of Birth/Sex: 05/17/1956 (60 y.o. Female) Treating RN: Huel Coventry Primary Care Physician: PATIENT, NO Other Clinician: Referring Physician: Phineas Semen Treating Physician/Extender: Rudene Re in Treatment: 14 Vital Signs Height(in): 62 Pulse(bpm): 96 Weight(lbs): 170 Blood Pressure 157/87 (mmHg): Body Mass Index(BMI): 31 Temperature(F): 98.0 Respiratory Rate 20 (breaths/min): Photos: [N/A:N/A] Wound Location: Right Lower Leg - Medial N/A N/A Wounding Event: Blister N/A N/A Primary Etiology: Venous Leg  Ulcer N/A N/A Date Acquired: 12/15/2015 N/A N/A Weeks of Treatment: 14 N/A N/A Wound Status: Open N/A N/A Measurements L x W x [name] 1.2x1x0.1 N/A N/A (cm) Area (cm) : 0.942 N/A N/A Volume (cm) : 0.094 N/A N/A % Reduction in Area: 96.40% N/A N/A % Reduction in Volume: 96.40% N/A N/A Classification: Partial Thickness N/A N/A Exudate Amount: Large N/A N/A Exudate Type: Serosanguineous N/A N/A Exudate Color: red, brown N/A N/A Wound Margin: Flat and Intact N/A N/A Granulation Amount: Large (67-100%) N/A N/A Granulation Quality: Red, Hyper-granulation, N/A N/A Friable Necrotic Amount: None Present (0%) N/A N/A Exposed Structures: Fascia: No N/A N/A Fat: No Reppert, [name] ([id number]) Tendon: No Muscle: No Joint: No Bone: No Limited to Skin Breakdown Epithelialization: None N/A N/A Periwound Skin Texture: Friable: Yes N/A N/A Edema: No Excoriation: No Induration: No Callus: No Crepitus: No Fluctuance: No Rash: No Scarring: No Periwound Skin Moist: Yes N/A N/A Moisture: Maceration: No Dry/Scaly: No Periwound Skin Color: Hemosiderin Staining: Yes N/A N/A Atrophie Blanche: No Cyanosis: No Ecchymosis: No Erythema: No Mottled: No Pallor: No Rubor: No Temperature: No Abnormality N/A N/A Tenderness on Yes N/A N/A Palpation: Wound Preparation: Ulcer Cleansing: Other: N/A N/A soap and water Topical Anesthetic Applied: None Treatment Notes Electronic Signature(s) Signed: 04/01/2016 5:06:33 PM By: Elliot Gurney, RN, [name], [name], [name] Entered By: Elliot Gurney, RN, [name], Kim on 03/30/2016 12:10:46 Baba, [name] ([id number]) -------------------------------------------------------------------------------- Multi-Disciplinary Care Plan Details Patient Name: Horvath, Aniylah L. Date of Service: 03/30/2016 11:30 AM Medical Record Number: [id number] Patient Account Number: [id number] Date of Birth/Sex: [birth year] (60 y.o. Female) Treating RN: Huel Coventry Primary Care Physician: PATIENT,  NO Other Clinician: Referring Physician: Phineas Semen Treating Physician/Extender: Rudene Re in Treatment: 14 Active Inactive Abuse / Safety / Falls / Self Care Management Nursing Diagnoses: Impaired physical mobility Potential for falls Goals: Patient will remain injury free Date Initiated: 12/23/2015 Goal Status: Active Interventions: Assess fall risk on admission and as needed Notes: Nutrition Nursing Diagnoses: Imbalanced nutrition Goals: Patient/caregiver verbalizes understanding of need to maintain therapeutic glucose control per primary care physician Date Initiated: 12/23/2015 Goal Status: Active Interventions: Provide education on nutrition Treatment Activities: Education provided on Nutrition : 01/21/2016 Notes: Orientation to the Wound Care Program Nursing Diagnoses: DENIAH, SAIA ([id number]) Knowledge deficit related to the wound healing center program Goals: Patient/caregiver will verbalize understanding of the Wound Healing Center Program Date Initiated: 12/23/2015 Goal Status: Active Interventions: Provide education on orientation to the wound center Notes: Venous Leg Ulcer Nursing Diagnoses: Potential for venous Insuffiency (use before diagnosis confirmed) Goals: Patient will  maintain optimal edema control Date Initiated: 12/23/2015 Goal Status: Active Interventions: Provide education on venous insufficiency Notes: Wound/Skin Impairment Nursing Diagnoses: Impaired tissue integrity Goals: Ulcer/skin breakdown will heal within 14 weeks Date Initiated: 12/23/2015 Goal Status: Active Interventions: Assess ulceration(s) every visit Notes: Electronic Signature(s) Signed: 04/01/2016 5:06:33 PM By: Elliot Gurney, RN, [name], [name], [name] Entered By: Elliot Gurney, RN, [name], Kim on 03/30/2016 12:10:25 Bloodsaw, [name] ([id number]) -------------------------------------------------------------------------------- Pain Assessment Details Patient Name:  Goodley, Sade L. Date of Service: 03/30/2016 11:30 AM Medical Record Number: [id number] Patient Account Number: [id number] Date of Birth/Sex: [birth year] (60 y.o. Female) Treating RN: Huel Coventry Primary Care Physician: PATIENT, NO Other Clinician: Referring Physician: Phineas Semen Treating Physician/Extender: Rudene Re in Treatment: 14 Active Problems Location of Pain Severity and Description of Pain Patient Has Paino No Site Locations With Dressing Change: No Pain Management and Medication Current Pain Management: Electronic Signature(s) Signed: 04/01/2016 5:06:33 PM By: Elliot Gurney, RN, [name], [name], [name] Entered By: Elliot Gurney, RN, [name], Kim on 03/30/2016 11:25:27 Mitten, [name] ([id number]) -------------------------------------------------------------------------------- Patient/Caregiver Education Details Patient Name: Dias, [name] Date of Service: 03/30/2016 11:30 AM Medical Record Number: [id number] Patient Account Number: [id number] Date of Birth/Gender: [birth year] (61 y.o. Female) Treating RN: Huel Coventry Primary Care Physician: PATIENT, NO Other Clinician: Referring Physician: Phineas Semen Treating Physician/Extender: Rudene Re in Treatment: 14 Education Assessment Education Provided To: Patient Education Topics Provided Venous: Handouts: Controlling Swelling with Multilayered Compression Wraps Methods: Demonstration, Explain/Verbal, Printed Responses: State content correctly Electronic Signature(s) Signed: 04/01/2016 5:06:33 PM By: Elliot Gurney, RN, [name], [name], [name] Entered By: Elliot Gurney, RN, [name], Kim on 03/30/2016 12:17:01 Ridling, [name] ([id number]) -------------------------------------------------------------------------------- Wound Assessment Details Patient Name: Grape, [name] Date of Service: 03/30/2016 11:30 AM Medical Record Number: [id number] Patient Account Number: [id number] Date of Birth/Sex: May 23, 1956 (60 y.o. Female) Treating RN:  Huel Coventry Primary Care Physician: PATIENT, NO Other Clinician: Referring Physician: Phineas Semen Treating Physician/Extender: Rudene Re in Treatment: 14 Wound Status Wound Number: 1 Primary Etiology: Venous Leg Ulcer Wound Location: Right Lower Leg - Medial Wound Status: Open Wounding Event: Blister Date Acquired: 12/15/2015 Weeks Of Treatment: 14 Clustered Wound: No Photos Wound Measurements Length: (cm) 1.2 Width: (cm) 1 Depth: (cm) 0.1 Area: (cm) 0.942 Volume: (cm) 0.094 % Reduction in Area: 96.4% % Reduction in Volume: 96.4% Epithelialization: None Wound Description Classification: Partial Thickness Wound Margin: Flat and Intact Exudate Amount: Large Exudate Type: Serosanguineous Exudate Color: red, brown Foul Odor After Cleansing: No Wound Bed Granulation Amount: Large (67-100%) Exposed Structure Granulation Quality: Red, Hyper-granulation, Friable Fascia Exposed: No Necrotic Amount: None Present (0%) Fat Layer Exposed: No Tendon Exposed: No Muscle Exposed: No Lobo, [name] ([id number]) Joint Exposed: No Bone Exposed: No Limited to Skin Breakdown Periwound Skin Texture Texture Color No Abnormalities Noted: No No Abnormalities Noted: No Callus: No Atrophie Blanche: No Crepitus: No Cyanosis: No Excoriation: No Ecchymosis: No Fluctuance: No Erythema: No Friable: Yes Hemosiderin Staining: Yes Induration: No Mottled: No Localized Edema: No Pallor: No Rash: No Rubor: No Scarring: No Temperature / Pain Moisture Temperature: No Abnormality No Abnormalities Noted: No Tenderness on Palpation: Yes Dry / Scaly: No Maceration: No Moist: Yes Wound Preparation Ulcer Cleansing: Other: soap and water, Topical Anesthetic Applied: None Treatment Notes Wound #1 (Right, Medial Lower Leg) 1. Cleansed with: Cleanse wound with antibacterial soap and water 3. Peri-wound Care: Moisturizing lotion 4. Dressing Applied: Other dressing  (specify in notes) 5. Secondary Dressing Applied ABD Pad 7. Secured with 3 Layer Compression System - Left  Lower Extremity 4-Layer Compression System - Right Lower Extremity Notes Siltec with hydrogel; unna to anchor Electronic Signature(s) Signed: 04/01/2016 5:06:33 PM By: Elliot Gurney, RN, [name], [name], [name] Entered By: Elliot Gurney, RN, [name], Kim on 03/30/2016 11:44:01 Kazee, [name] ([id number]) Cabler, [name] ([id number]) -------------------------------------------------------------------------------- Vitals Details Patient Name: Muska, [name] Date of Service: 03/30/2016 11:30 AM Medical Record Number: [id number] Patient Account Number: [id number] Date of Birth/Sex: [birth year] (60 y.o. Female) Treating RN: Huel Coventry Primary Care Physician: PATIENT, NO Other Clinician: Referring Physician: Phineas Semen Treating Physician/Extender: Rudene Re in Treatment: 14 Vital Signs Time Taken: 11:25 Temperature (F): 98.0 Height (in): 62 Pulse (bpm): 96 Weight (lbs): 170 Respiratory Rate (breaths/min): 20 Body Mass Index (BMI): 31.1 Blood Pressure (mmHg): 157/87 Reference Range: 80 - 120 mg / dl Electronic Signature(s) Signed: 04/01/2016 5:06:33 PM By: Elliot Gurney, RN, [name], [name], [name] Entered By: Elliot Gurney, RN, [name], Kim on 03/30/2016 11:25:49

## 2016-04-06 ENCOUNTER — Encounter: Payer: Medicare Other | Admitting: Internal Medicine

## 2016-04-06 DIAGNOSIS — I87331 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 lower extremity: Secondary | ICD-10-CM | POA: Diagnosis not present

## 2016-04-07 NOTE — Progress Notes (Signed)
[name], [name] ([id number]) Visit Report for 04/06/2016 Arrival Information Details Patient Name: [name], [name] Date of Service: 04/06/2016 12:45 PM Medical Record Patient Account Number: [id number] Number: Treating RN: Curtis Sites February 24, 1956 (60 y.o. Other Clinician: Date of Birth/Sex: Female) Treating ROBSON, MICHAEL Primary Care Physician: PATIENT, NO Physician/Extender: G Referring Physician: Everette Rank in Treatment: 15 Visit Information History Since Last Visit Added or deleted any medications: No Patient Arrived: Ambulatory Any new allergies or adverse reactions: No Arrival Time: 12:50 Had a fall or experienced change in No Accompanied By: self activities of daily living that may affect Transfer Assistance: None risk of falls: Patient Identification Verified: Yes Signs or symptoms of abuse/neglect since last No Secondary Verification Process Yes visito Completed: Hospitalized since last visit: No Patient Requires Transmission-Based No Pain Present Now: No Precautions: Patient Has Alerts: No Electronic Signature(s) Signed: 04/06/2016 4:39:07 PM By: Curtis Sites Entered By: Curtis Sites on 04/06/2016 12:55:03 Paver, [name] ([id number]) -------------------------------------------------------------------------------- Encounter Discharge Information Details Patient Name: [name], [name] Date of Service: 04/06/2016 12:45 PM Medical Record Patient Account Number: [id number] Number: Treating RN: Curtis Sites 1956/08/11 (60 y.o. Other Clinician: Date of Birth/Sex: Female) Treating ROBSON, MICHAEL Primary Care Physician: PATIENT, NO Physician/Extender: G Referring Physician: Everette Rank in Treatment: 15 Encounter Discharge Information Items Discharge Pain Level: 0 Discharge Condition: Stable Ambulatory Status: Ambulatory Discharge Destination: Home Transportation: Private Auto Accompanied By:  self Schedule Follow-up Appointment: Yes Medication Reconciliation completed and provided to Patient/Care No Sukhdeep Wieting: Provided on Clinical Summary of Care: 04/06/2016 Form Type Recipient Paper Patient GC Electronic Signature(s) Signed: 04/06/2016 2:45:44 PM By: Curtis Sites Previous Signature: 04/06/2016 1:38:28 PM Version By: Gwenlyn Perking Entered By: Curtis Sites on 04/06/2016 14:45:43 Bollier, [name] ([id number]) -------------------------------------------------------------------------------- Lower Extremity Assessment Details Patient Name: [name], [name] Date of Service: 04/06/2016 12:45 PM Medical Record Patient Account Number: [id number] Number: Treating RN: Curtis Sites [birth year] (60 y.o. Other Clinician: Date of Birth/Sex: Female) Treating ROBSON, MICHAEL Primary Care Physician: PATIENT, NO Physician/Extender: G Referring Physician: Everette Rank in Treatment: 15 Edema Assessment Assessed: [Left: No] [Right: No] Edema: [Left: Ye] [Right: s] Calf Left: Right: Point of Measurement: 31 cm From Medial Instep cm 48.2 cm Ankle Left: Right: Point of Measurement: 11 cm From Medial Instep cm 24.5 cm Vascular Assessment Pulses: Posterior Tibial Dorsalis Pedis Palpable: [Right:Yes] Extremity colors, hair growth, and conditions: Extremity Color: [Right:Hyperpigmented] Hair Growth on Extremity: [Right:No] Temperature of Extremity: [Right:Warm] Capillary Refill: [Right:< 3 seconds] Electronic Signature(s) Signed: 04/06/2016 4:39:07 PM By: Curtis Sites Entered By: Curtis Sites on 04/06/2016 13:04:36 Devine, [name] ([id number]) -------------------------------------------------------------------------------- Multi Wound Chart Details Patient Name: [name], [name] Date of Service: 04/06/2016 12:45 PM Medical Record Patient Account Number: [id number] Number: Treating RN: Curtis Sites [birth year] (60 y.o. Other  Clinician: Date of Birth/Sex: Female) Treating ROBSON, MICHAEL Primary Care Physician: PATIENT, NO Physician/Extender: G Referring Physician: Everette Rank in Treatment: 15 Vital Signs Height(in): 62 Pulse(bpm): 93 Weight(lbs): 170 Blood Pressure 158/76 (mmHg): Body Mass Index(BMI): 31 Temperature(F): 97.8 Respiratory Rate 18 (breaths/min): Photos: [N/A:N/A] Wound Location: Right Lower Leg - Medial N/A N/A Wounding Event: Blister N/A N/A Primary Etiology: Venous Leg Ulcer N/A N/A Date Acquired: 12/15/2015 N/A N/A Weeks of Treatment: 15 N/A N/A Wound Status: Open N/A N/A Measurements L x W x D 1.1x1x0.1 N/A N/A (cm) Area (cm) : 0.864 N/A N/A Volume (cm) : 0.086 N/A N/A % Reduction in Area: 96.70% N/A N/A % Reduction in Volume: 96.70% N/A N/A  Classification: Partial Thickness N/A N/A Exudate Amount: Large N/A N/A Exudate Type: Serosanguineous N/A N/A Exudate Color: red, brown N/A N/A Wound Margin: Flat and Intact N/A N/A Granulation Amount: Large (67-100%) N/A N/A Granulation Quality: Red, Hyper-granulation, N/A N/A Friable Necrotic Amount: None Present (0%) N/A N/A Slape, Twana L. ([id number]) Exposed Structures: Fascia: No N/A N/A Fat: No Tendon: No Muscle: No Joint: No Bone: No Limited to Skin Breakdown Epithelialization: None N/A N/A Periwound Skin Texture: Friable: Yes N/A N/A Edema: No Excoriation: No Induration: No Callus: No Crepitus: No Fluctuance: No Rash: No Scarring: No Periwound Skin Moist: Yes N/A N/A Moisture: Maceration: No Dry/Scaly: No Periwound Skin Color: Hemosiderin Staining: Yes N/A N/A Atrophie Blanche: No Cyanosis: No Ecchymosis: No Erythema: No Mottled: No Pallor: No Rubor: No Temperature: No Abnormality N/A N/A Tenderness on Yes N/A N/A Palpation: Wound Preparation: Ulcer Cleansing: Other: N/A N/A soap and water Topical Anesthetic Applied: None Treatment Notes Electronic Signature(s) Signed:  04/06/2016 4:39:07 PM By: Curtis Sites Entered By: Curtis Sites on 04/06/2016 13:13:48 Ganesh, [name] ([id number]) -------------------------------------------------------------------------------- Multi-Disciplinary Care Plan Details Patient Name: [name], [name] Date of Service: 04/06/2016 12:45 PM Medical Record Patient Account Number: [id number] Number: Treating RN: Curtis Sites 10-16-1955 (60 y.o. Other Clinician: Date of Birth/Sex: Female) Treating ROBSON, MICHAEL Primary Care Physician: PATIENT, NO Physician/Extender: G Referring Physician: Everette Rank in Treatment: 15 Active Inactive Abuse / Safety / Falls / Self Care Management Nursing Diagnoses: Impaired physical mobility Potential for falls Goals: Patient will remain injury free Date Initiated: 12/23/2015 Goal Status: Active Interventions: Assess fall risk on admission and as needed Notes: Nutrition Nursing Diagnoses: Imbalanced nutrition Goals: Patient/caregiver verbalizes understanding of need to maintain therapeutic glucose control per primary care physician Date Initiated: 12/23/2015 Goal Status: Active Interventions: Provide education on nutrition Treatment Activities: Education provided on Nutrition : 01/21/2016 Notes: Orientation to the Wound Care Program RHESA, FORSBERG ([id number]) Nursing Diagnoses: Knowledge deficit related to the wound healing center program Goals: Patient/caregiver will verbalize understanding of the Wound Healing Center Program Date Initiated: 12/23/2015 Goal Status: Active Interventions: Provide education on orientation to the wound center Notes: Venous Leg Ulcer Nursing Diagnoses: Potential for venous Insuffiency (use before diagnosis confirmed) Goals: Patient will maintain optimal edema control Date Initiated: 12/23/2015 Goal Status: Active Interventions: Provide education on venous insufficiency Notes: Wound/Skin Impairment Nursing  Diagnoses: Impaired tissue integrity Goals: Ulcer/skin breakdown will heal within 14 weeks Date Initiated: 12/23/2015 Goal Status: Active Interventions: Assess ulceration(s) every visit Notes: Electronic Signature(s) Signed: 04/06/2016 4:39:07 PM By: Curtis Sites Entered By: Curtis Sites on 04/06/2016 13:12:52 Langi, [name] ([id number]) Roblero, Mashell Elbert Ewings ([id number]) -------------------------------------------------------------------------------- Pain Assessment Details Patient Name: Weidemann, Ronisha L. Date of Service: 04/06/2016 12:45 PM Medical Record Patient Account Number: [id number] Number: Treating RN: Curtis Sites 12-Sep-1955 (60 y.o. Other Clinician: Date of Birth/Sex: Female) Treating ROBSON, MICHAEL Primary Care Physician: PATIENT, NO Physician/Extender: G Referring Physician: Everette Rank in Treatment: 15 Active Problems Location of Pain Severity and Description of Pain Patient Has Paino No Site Locations Pain Management and Medication Current Pain Management: Notes Topical or injectable lidocaine is offered to patient for acute pain when surgical debridement is performed. If needed, Patient is instructed to use over the counter pain medication for the following 24-48 hours after debridement. Wound care MDs do not prescribed pain medications. Patient has chronic pain or uncontrolled pain. Patient has been instructed to make an appointment with their Primary Care Physician for pain management. Electronic Signature(s) Signed: 04/06/2016 4:39:07 PM  By: Curtis Sites Entered By: Curtis Sites on 04/06/2016 12:55:10 Gallaga, [name] ([id number]) -------------------------------------------------------------------------------- Patient/Caregiver Education Details Patient Name: [name]. Date of Service: 04/06/2016 12:45 PM Medical Record Patient Account Number: [id number] Number: Treating RN: Curtis Sites 05-Sep-1955  (60 y.o. Other Clinician: Date of Birth/Gender: Female) Treating ROBSON, MICHAEL Primary Care Physician: PATIENT, NO Physician/Extender: G Referring Physician: Everette Rank in Treatment: 15 Education Assessment Education Provided To: Patient Education Topics Provided Venous: Handouts: Other: juxtalite on left leg, do not cut wrap - come in for rewrap if needed Methods: Demonstration, Explain/Verbal Responses: State content correctly Electronic Signature(s) Signed: 04/06/2016 4:39:07 PM By: Curtis Sites Entered By: Curtis Sites on 04/06/2016 14:46:17 Welz, [name] ([id number]) -------------------------------------------------------------------------------- Wound Assessment Details Patient Name: Ancheta, Syrenity L. Date of Service: 04/06/2016 12:45 PM Medical Record Patient Account Number: [id number] Number: Treating RN: Curtis Sites 1955/12/08 (60 y.o. Other Clinician: Date of Birth/Sex: Female) Treating ROBSON, MICHAEL Primary Care Physician: PATIENT, NO Physician/Extender: G Referring Physician: Everette Rank in Treatment: 15 Wound Status Wound Number: 1 Primary Etiology: Venous Leg Ulcer Wound Location: Right Lower Leg - Medial Wound Status: Open Wounding Event: Blister Date Acquired: 12/15/2015 Weeks Of Treatment: 15 Clustered Wound: No Photos Wound Measurements Length: (cm) 1.1 Width: (cm) 1 Depth: (cm) 0.1 Area: (cm) 0.864 Volume: (cm) 0.086 % Reduction in Area: 96.7% % Reduction in Volume: 96.7% Epithelialization: None Tunneling: No Undermining: No Wound Description Classification: Partial Thickness Wound Margin: Flat and Intact Exudate Amount: Large Exudate Type: Serosanguineous Exudate Color: red, brown Foul Odor After Cleansing: No Wound Bed Granulation Amount: Large (67-100%) Exposed Structure Granulation Quality: Red, Hyper-granulation, Friable Fascia Exposed: No Necrotic Amount: None Present (0%) Fat  Layer Exposed: No Willingham, Sharnelle L. ([id number]) Tendon Exposed: No Muscle Exposed: No Joint Exposed: No Bone Exposed: No Limited to Skin Breakdown Periwound Skin Texture Texture Color No Abnormalities Noted: No No Abnormalities Noted: No Callus: No Atrophie Blanche: No Crepitus: No Cyanosis: No Excoriation: No Ecchymosis: No Fluctuance: No Erythema: No Friable: Yes Hemosiderin Staining: Yes Induration: No Mottled: No Localized Edema: No Pallor: No Rash: No Rubor: No Scarring: No Temperature / Pain Moisture Temperature: No Abnormality No Abnormalities Noted: No Tenderness on Palpation: Yes Dry / Scaly: No Maceration: No Moist: Yes Wound Preparation Ulcer Cleansing: Other: soap and water, Topical Anesthetic Applied: None Treatment Notes Wound #1 (Right, Medial Lower Leg) 1. Cleansed with: Clean wound with Normal Saline Cleanse wound with antibacterial soap and water 2. Anesthetic Topical Lidocaine 4% cream to wound bed prior to debridement 4. Dressing Applied: Other dressing (specify in notes) 5. Secondary Dressing Applied ABD Pad 7. Secured with 4-Layer Compression System - Right Lower Extremity Notes Siltec with hydrogel; unna to anchor Electronic Signature(s) Signed: 04/06/2016 4:39:07 PM By: Micah Noel, [name] ([id number]) Entered By: Curtis Sites on 04/06/2016 13:12:23 Donelan, [name] ([id number]) -------------------------------------------------------------------------------- Vitals Details Patient Name: Pelland, Audianna L. Date of Service: 04/06/2016 12:45 PM Medical Record Patient Account Number: [id number] Number: Treating RN: Curtis Sites November 21, 1955 (60 y.o. Other Clinician: Date of Birth/Sex: Female) Treating ROBSON, MICHAEL Primary Care Physician: PATIENT, NO Physician/Extender: G Referring Physician: Everette Rank in Treatment: 15 Vital Signs Time Taken: 13:54 Temperature (F): 97.8 Height  (in): 62 Pulse (bpm): 93 Weight (lbs): 170 Respiratory Rate (breaths/min): 18 Body Mass Index (BMI): 31.1 Blood Pressure (mmHg): 158/76 Reference Range: 80 - 120 mg / dl Electronic Signature(s) Signed: 04/06/2016 4:39:07 PM By: Curtis Sites Entered By: Curtis Sites on 04/06/2016 12:55:33

## 2016-04-07 NOTE — Progress Notes (Signed)
[name], [name] ([id number]) Visit Report for 04/06/2016 Chief Complaint Document Details [name], [name] Date of Service: 04/06/2016 12:45 PM Patient Name: [name] Account Number: [id number] Medical Record Treating RN: [name] [id number] Number: Other Clinician: [birth year] (60 y.o. Treating [name] Date of Birth/Sex: Female) Physician/Extender: G Primary Care PATIENT, NO Physician: Referring Physician: Everette Rank in Treatment: 15 Information Obtained from: Patient Chief Complaint Patient is here for review of a fairly substantial wound on her right medial lower leg that is been present for the last week Electronic Signature(s) Signed: 04/07/2016 7:31:48 AM By: [name] Najjar MD Entered By: [name] on 04/06/2016 13:28:50 [name], [name] ([id number]) -------------------------------------------------------------------------------- Debridement Details [name], [name] Date of Service: 04/06/2016 12:45 PM Patient Name: [name] Account Number: [id number] Medical Record Treating RN: [name] [id number] Number: Other Clinician: [birth year] (60 y.o. Treating Gail Creekmore Date of Birth/Sex: Female) Physician/Extender: G Primary Care PATIENT, NO Physician: Referring Physician: Everette Rank in Treatment: 15 Debridement Performed for Wound #1 Right,Medial Lower Leg Assessment: Performed By: Physician Maxwell Caul, MD Debridement: Debridement Pre-procedure Yes - 13:13 Verification/Time Out Taken: Start Time: 13:14 Pain Control: Lidocaine 4% Topical Solution Level: Skin/Subcutaneous Tissue Total Area Debrided (L x 1.1 (cm) x 1 (cm) = 1.1 (cm) W): Tissue and other Viable, Non-Viable, Fibrin/Slough, Subcutaneous material debrided: Instrument: Forceps, Scissors Bleeding: Minimum Hemostasis Achieved: Pressure End Time: 13:16 Procedural Pain: 0 Post Procedural Pain: 0 Response to Treatment: Procedure was  tolerated well Post Debridement Measurements of Total Wound Length: (cm) 1.1 Width: (cm) 1 Depth: (cm) 0.1 Volume: (cm) 0.086 Character of Wound/Ulcer Post Stable Debridement: Severity of Tissue Post Debridement: Limited to breakdown of skin Post Procedure Diagnosis Same as Pre-procedure Electronic Signature(s) [name], [name] ([id number]) Signed: 04/06/2016 4:39:07 PM By: [name] Signed: 04/07/2016 7:31:48 AM By: [name] Najjar MD Entered By: [name] on 04/06/2016 13:28:12 [name], [name] ([id number]) -------------------------------------------------------------------------------- HPI Details [name] Date of Service: 04/06/2016 12:45 PM Patient Name: [name] Account Number: [id number] Medical Record Treating RN: [name] [id number] Number: Other Clinician: 20-Jun-1956 (60 y.o. Treating Taje Tondreau Date of Birth/Sex: Female) Physician/Extender: G Primary Care PATIENT, NO Physician: Referring Physician: Everette Rank in Treatment: 15 History of Present Illness HPI Description: 12/23/15; this is a 60 year old lady who recently relocated back to [place] from [place]. She is staying with a family friend previously lived with a daughter in [place]. 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 She is on chronic prednisone she is not a diabetic. Dose of prednisone is 10 mg. The patient tells me that roughly a week ago she developed a fairly substantial blister over this area that then ruptured. She was seen in the emergency room on 5/7 an x-ray of the leg was negative she was put on Septra although I don't think any cultures were done. She also tells me she has had chronic edema in her legs for a number of years. She had a similar presentation to currently in the right lateral lower leg roughly a year ago that she was able to heal on her own. As noted she has systemic  lupus but does not have lupus nephritis according to the patient. She has a lot of edema in her lower legs. The ABI's could not be obtained. 12/31/15; the patient's insurance which is [place] [employer] makes her out of network for any home health therefore we have been changing her dressing in our facility.  I reviewed her trip to the ER on 12/21/2015 her creatinine was within the normal range hemoglobin and white count normal. Culture of the wound was negative. X-ray of the leg showed soft tissue edema no foreign bodies. We have been dressing this with Aquacel Ag due to the amount of ongoing drainage 01/07/16; the patient had her arterial studies this morning I don't have these results area she comes back in to have [place] rewrap since she doesn't have access to home health 01/14/16 I still do not have the results of her arterial studies. Our nurse correctly pointed out that we've been wrapping the left leg without an open wound I think this had to do with the fact that she had so much edema when she came in I was concerned about leaving this unwrapped. 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 We left her left leg unwrapped last week as she has no open wound here today she has extensive edema here. We did order stockings however I believe the company called but they have not called them back. She comes back in today with extensive edema in the left leg. We have her arterial studies which showed triphasic waves and all locations tested including her posterior tibial and anterior tibial arteries bilaterally. Report listed no hemodynamically significant bilateral lower extremity arterial stenosis. She has her venous studies later this month. I'm going to rewrap the left leg due to the extent of the edema, transitioning her into the stocking we ordered last week as soon as one is  available 01/27/16 the patient arrived today with pooled serosanguineous drainage over the wound it would not absorb into her Aquacel Ag which is somewhat on. In spite of this the dimensions of her wound are down [name], [name] ([id number]) considerably and the wound bed looks healthy without any need for debridement. She is obtained her juxtalite stockings the left leg which has no open wound. We are waiting for the right leg wound to get smaller before ordering her one for the right leg 02/03/16; patient wound looked improved. No debridement was required. We've been using the juxtalite stocking on the left leg which is no open wound. 02/11/16 wound is remarkably better. No debridement was required. Healthy rim of epithelialization. We have been using Aquacel Ag, we'll try to close this down with RTD over the next 2 weeks 02/18/16 wound continues to improve down a centimeter in length and width. No debridement was required. Advancing epithelialization. We started RTD last week 02/25/16: pt brought juxtalite for right lower leg today. nurse reports RTD was dry and adhered to the wound. required removal by NS. wound bed with 100% healthy red granulation tissue. denies systemic s/s of infection. 03/02/16; Considerable improvement. no debridement. 03/09/16; wound bed is very vascular no major change in dimensions. Continuing with RTD 03/16/16; unfortunately no major change in dimensions however the wound bed looks very healthy. I will continue with RTD for one more week. If his stalls consider change to collagen. 03/30/2016 -- she's been using juxta lites on her left lower extremity but her lymphedema is very significantly increased and she is not tolerating the juxta lites the edema has increased symptoms significantly 04/06/16; apparently the RTD started sticking to the wound therefore she was changed to sorbact. Her 4-layer wrap appears to have slipped one third of the way down her leg. She was  tolerating the juxtalite on the left leg, I don't see the need to wrap the left leg. She is not a candidate for external compression pumps secondary to insurance issues. Electronic Signature(s) Signed: 04/07/2016 7:31:48 AM By: [name] Najjar MD Entered By: [name] on 04/06/2016 13:30:24 Meulemans, [name] ([id number]) -------------------------------------------------------------------------------- Physical Exam Details [name] Date of Service: 04/06/2016 12:45 PM Patient Name: [name] Account Number: [id number] Medical Record Treating RN: [name] [id number] Number: Other Clinician: Sep 01, 1955 (60 y.o. Treating Lokelani Lutes Date of Birth/Sex: Female) Physician/Extender: G Primary Care PATIENT, NO Physician: Referring Physician: Everette Rank in Treatment: 15 Constitutional Patient is hypertensive.. Pulse regular and within target range for patient.Marland Kitchen Respirations regular, non-labored and within target range.. Temperature is normal and within the target range for the patient.. She is not in any distress. Respiratory Respiratory effort is easy and symmetric bilaterally. Rate is normal at rest and on room air.. Bilateral breath sounds are clear and equal in all lobes with no wheezes, rales or rhonchi.. Cardiovascular No evidence of CHF. Edema present in both extremities. Especially where her wrapped slipped down on the right. Notes Wound exam; the right leg wound has a smaller open area. Still a vascular-looking wound debridement was done to remove surface slough. The left leg has no open wound and although she has venous insufficiency and secondary lymphedema she is not a candidate for external compression pumps largely because of insurance issues however I'll need to look into this Electronic Signature(s) Signed: 04/07/2016 7:31:48 AM By: [name] Najjar MD Entered By: [name] on 04/06/2016 13:33:12 Lepage, [name]  ([id number]) -------------------------------------------------------------------------------- Physician Orders Details [name] Date of Service: 04/06/2016 12:45 PM Patient Name: [name] Account Number: [id number] Medical Record Treating RN: [name] [id number] Number: Other Clinician: [birth year] (60 y.o. Treating Illyana Schorsch Date of Birth/Sex: Female) Physician/Extender: G Primary Care PATIENT, NO Physician: Referring Physician: Everette Rank in Treatment: 15 Verbal / Phone Orders: Yes Clinician: Curtis [name] Read Back and Verified: Yes Diagnosis Coding Wound Cleansing Wound #1 Right,Medial Lower Leg o Cleanse wound with mild soap and water Skin Barriers/Peri-Wound Care Wound #1 Right,Medial Lower Leg o Moisturizing lotion Primary Wound Dressing Wound #1 Right,Medial Lower Leg o Cutimed Siltec Secondary Dressing Wound #1 Right,Medial Lower Leg o ABD pad Dressing Change Frequency Wound #1 Right,Medial Lower Leg o Change dressing every week Follow-up Appointments Wound #1 Right,Medial Lower Leg o Return Appointment in 1 week. Edema Control Wound #1 Right,Medial Lower Leg o 4-Layer Compression System - Right Lower Extremity - unna to anchor Additional Orders / Instructions Wound #1 Right,Medial Lower Leg o Increase protein intake. SHARONNA, VINJE ([id number]) Electronic Signature(s) Signed: 04/06/2016 4:39:07 PM By: [name] Signed: 04/07/2016 7:31:48 AM By: [name] Najjar MD Entered By: [name] on 04/06/2016 13:37:11 Brickle, [name] ([id number]) -------------------------------------------------------------------------------- Problem List Details [name] Date of Service: 04/06/2016 12:45 PM Patient Name: [name] Account Number: [id number] Medical Record Treating RN: [name] [id number] Number: Other Clinician: 1956/02/17 (60 y.o. Treating Anelise Staron Date of Birth/Sex: Female)  Physician/Extender: G Primary Care PATIENT, NO Physician: Referring Physician: Everette Rank in Treatment: 15 Active Problems ICD-10 Encounter Code Description Active Date Diagnosis I87.331 Chronic venous hypertension (idiopathic) with ulcer and 12/23/2015 Yes inflammation of right lower extremity I87.322 Chronic venous hypertension (idiopathic) with 12/23/2015 Yes inflammation of left lower extremity M32.10 Systemic lupus erythematosus, organ or system 12/23/2015 Yes involvement unspecified L97.211 Non-pressure chronic ulcer of right calf limited to 03/02/2016 Yes breakdown of skin Inactive Problems  Resolved Problems Electronic Signature(s) Signed: 04/07/2016 7:31:48 AM By: [name] Najjar MD Entered By: [name] on 04/06/2016 13:27:48 Wenberg, [name] ([id number]) -------------------------------------------------------------------------------- Progress Note Details [name] Date of Service: 04/06/2016 12:45 PM Patient Name: [name] Account Number: [id number] Medical Record Treating RN: [name] [id number] Number: Other Clinician: 20-Oct-1955 (60 y.o. Treating Renton Berkley Date of Birth/Sex: Female) Physician/Extender: G Primary Care PATIENT, NO Physician: Referring Physician: Everette Rank in Treatment: 15 Subjective Chief Complaint Information obtained from Patient Patient is here for review of a fairly substantial wound on her right medial lower leg that is been present for the last week History of Present Illness (HPI) 12/23/15; this is a 60 year old lady who recently relocated back to [place] from [place]. She is staying with a family friend previously lived with a daughter in [place]. 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 She is on chronic prednisone she is not a diabetic. Dose of prednisone is 10 mg. The patient tells me that roughly a week ago she  developed a fairly substantial blister over this area that then ruptured. She was seen in the emergency room on 5/7 an x-ray of the leg was negative she was put on Septra although I don't think any cultures were done. She also tells me she has had chronic edema in her legs for a number of years. She had a similar presentation to currently in the right lateral lower leg roughly a year ago that she was able to heal on her own. As noted she has systemic lupus but does not have lupus nephritis according to the patient. She has a lot of edema in her lower legs. The ABI's could not be obtained. 12/31/15; the patient's insurance which is [place] [employer] makes her out of network for any home health therefore we have been changing her dressing in our facility. I reviewed her trip to the ER on 12/21/2015 her creatinine was within the normal range hemoglobin and white count normal. Culture of the wound was negative. X-ray of the leg showed soft tissue edema no foreign bodies. We have been dressing this with Aquacel Ag due to the amount of ongoing drainage 01/07/16; the patient had her arterial studies this morning I don't have these results area she comes back in to have [place] rewrap since she doesn't have access to home health 01/14/16 I still do not have the results of her arterial studies. Our nurse correctly pointed out that we've been wrapping the left leg without an open wound I think this had to do with the fact that she had so much edema when she came in I was concerned about leaving this unwrapped. 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 We left her left leg unwrapped last week as she has no open wound here today she has extensive edema here. We did order stockings however I believe the company called but they have not called them Stipes, Siena L. ([id number]) back. She comes  back in today with extensive edema in the left leg. We have her arterial studies which showed triphasic waves and all locations tested including her posterior tibial and anterior tibial arteries bilaterally. Report listed no hemodynamically significant bilateral lower extremity arterial stenosis. She has her venous studies later this month. I'm going to rewrap the left leg due  to the extent of the edema, transitioning her into the stocking we ordered last week as soon as one is available 01/27/16 the patient arrived today with pooled serosanguineous drainage over the wound it would not absorb into her Aquacel Ag which is somewhat on. In spite of this the dimensions of her wound are down considerably and the wound bed looks healthy without any need for debridement. She is obtained her juxtalite stockings the left leg which has no open wound. We are waiting for the right leg wound to get smaller before ordering her one for the right leg 02/03/16; patient wound looked improved. No debridement was required. We've been using the juxtalite stocking on the left leg which is no open wound. 02/11/16 wound is remarkably better. No debridement was required. Healthy rim of epithelialization. We have been using Aquacel Ag, we'll try to close this down with RTD over the next 2 weeks 02/18/16 wound continues to improve down a centimeter in length and width. No debridement was required. Advancing epithelialization. We started RTD last week 02/25/16: pt brought juxtalite for right lower leg today. nurse reports RTD was dry and adhered to the wound. required removal by NS. wound bed with 100% healthy red granulation tissue. denies systemic s/s of infection. 03/02/16; Considerable improvement. no debridement. 03/09/16; wound bed is very vascular no major change in dimensions. Continuing with RTD 03/16/16; unfortunately no major change in dimensions however the wound bed looks very healthy. I will continue with RTD for one  more week. If his stalls consider change to collagen. 03/30/2016 -- she's been using juxta lites on her left lower extremity but her lymphedema is very significantly increased and she is not tolerating the juxta lites the edema has increased symptoms significantly 04/06/16; apparently the RTD started sticking to the wound therefore she was changed to sorbact. Her 4-layer wrap appears to have slipped one third of the way down her leg. She was tolerating the juxtalite on the left leg, I don't see the need to wrap the left leg. She is not a candidate for external compression pumps secondary to insurance issues. Objective Constitutional Patient is hypertensive.. Pulse regular and within target range for patient.Marland Kitchen Respirations regular, non-labored and within target range.. Temperature is normal and within the target range for the patient.. She is not in any distress. Vitals Time Taken: 1:54 PM, Height: 62 in, Weight: 170 lbs, BMI: 31.1, Temperature: 97.8 F, Pulse: 93 bpm, Respiratory Rate: 18 breaths/min, Blood Pressure: 158/76 mmHg. Respiratory Respiratory effort is easy and symmetric bilaterally. Rate is normal at rest and on room air.. Bilateral breath Merica, Trinitie L. ([id number]) sounds are clear and equal in all lobes with no wheezes, rales or rhonchi.. Cardiovascular No evidence of CHF. Edema present in both extremities. Especially where her wrapped slipped down on the right. General Notes: Wound exam; the right leg wound has a smaller open area. Still a vascular-looking wound debridement was done to remove surface slough. The left leg has no open wound and although she has venous insufficiency and secondary lymphedema she is not a candidate for external compression pumps largely because of insurance issues however I'll need to look into this Integumentary (Hair, Skin) Wound #1 status is Open. Original cause of wound was Blister. The wound is located on the Right,Medial Lower Leg.  The wound measures 1.1cm length x 1cm width x 0.1cm depth; 0.864cm^2 area and 0.086cm^3 volume. The wound is limited to skin breakdown. There is no tunneling or undermining noted. There is a large  amount of serosanguineous drainage noted. The wound margin is flat and intact. There is large (67-100%) red, friable granulation within the wound bed. There is no necrotic tissue within the wound bed. The periwound skin appearance exhibited: Friable, Moist, Hemosiderin Staining. The periwound skin appearance did not exhibit: Callus, Crepitus, Excoriation, Fluctuance, Induration, Localized Edema, Rash, Scarring, Dry/Scaly, Maceration, Atrophie Blanche, Cyanosis, Ecchymosis, Mottled, Pallor, Rubor, Erythema. Periwound temperature was noted as No Abnormality. The periwound has tenderness on palpation. Assessment Active Problems ICD-10 I87.331 - 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 lower extremity I87.322 - Chronic venous hypertension (idiopathic) with inflammation of left lower extremity M32.10 - Systemic lupus erythematosus, organ or system involvement unspecified L97.211 - Non-pressure chronic ulcer of right calf limited to breakdown of skin Procedures Wound #1 Wound #1 is a Venous Leg Ulcer located on the Right,Medial Lower Leg . There was a Skin/Subcutaneous Tissue Debridement ([id number]) debridement with total area of 1.1 sq cm performed by Maxwell Caul, MD. with the following instrument(s): Forceps and Scissors to remove Viable and Non-Viable tissue/material including Fibrin/Slough and Subcutaneous after achieving pain control using Lidocaine 4% Topical Solution. A time out was conducted at 13:13, prior to the start of the procedure. A Minimum amount Dovidio, Angle L. ([id number]) of bleeding was controlled with Pressure. The procedure was tolerated well with a pain level of 0 throughout and a pain level of 0 following the procedure. Post  Debridement Measurements: 1.1cm length x 1cm width x 0.1cm depth; 0.086cm^3 volume. Character of Wound/Ulcer Post Debridement is stable. Severity of Tissue Post Debridement is: Limited to breakdown of skin. Post procedure Diagnosis Wound #1: Same as Pre-Procedure Plan #1Continue with sorbact to the medial right leg under a profore wrap 2back to juxtlite stocking on the left. 3 external compression pumps were brought up at her last vistt, however I don't believe she has insurance to cover any part of this, I'll have our staff look into this Electronic Signature(s) Signed: 04/07/2016 7:31:48 AM By: [name] Najjar MD Entered By: [name] on 04/06/2016 13:35:21 Delauder, [name] ([id number]) -------------------------------------------------------------------------------- SuperBill Details Pogorzelski, Bree Date of Service: 04/06/2016 Patient Name: [name] Account Number: [id number] Medical Record Treating RN: [name] [id number] Number: Other Clinician: [birth year] (60 y.o. Treating Ej Pinson Date of Birth/Sex: Female) Physician/Extender: G Primary Care Weeks in Treatment: 36 PATIENT, NO Physician: Referring Physician: Phineas Semen Diagnosis Coding ICD-10 Codes Code Description 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 lower I87.331 extremity I87.322 Chronic venous hypertension (idiopathic) with inflammation of left lower extremity M32.10 Systemic lupus erythematosus, organ or system involvement unspecified L97.211 Non-pressure chronic ulcer of right calf limited to breakdown of skin Facility Procedures CPT4: Description Modifier Quantity Code [id number] 11042 - DEB SUBQ TISSUE 20 SQ CM/< 1 ICD-10 Description Diagnosis I87.331 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 lower extremity Physician Procedures CPT4: Description Modifier Quantity Code [id number] 11042 - WC PHYS SUBQ TISS 20 SQ CM 1  ICD-10 Description Diagnosis I87.331 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 lower extremity Electronic Signature(s) Signed: 04/07/2016 7:31:48 AM By: [name] Najjar MD Entered By: [name] on 04/06/2016 13:36:11

## 2016-04-08 ENCOUNTER — Ambulatory Visit: Payer: Medicare Other

## 2016-04-13 ENCOUNTER — Encounter: Payer: Medicare Other | Admitting: Internal Medicine

## 2016-04-13 ENCOUNTER — Ambulatory Visit
Admission: RE | Admit: 2016-04-13 | Discharge: 2016-04-13 | Disposition: A | Discharge status: Home / Self Care | Payer: Medicare Other | Source: Ambulatory Visit | Attending: Internal Medicine | Admitting: Internal Medicine

## 2016-04-13 DIAGNOSIS — M329 Systemic lupus erythematosus, unspecified: Secondary | ICD-10-CM | POA: Diagnosis not present

## 2016-04-13 DIAGNOSIS — I517 Cardiomegaly: Secondary | ICD-10-CM | POA: Insufficient documentation

## 2016-04-13 DIAGNOSIS — I340 Nonrheumatic mitral (valve) insufficiency: Secondary | ICD-10-CM | POA: Insufficient documentation

## 2016-04-13 DIAGNOSIS — Z8673 Personal history of transient ischemic attack (TIA), and cerebral infarction without residual deficits: Secondary | ICD-10-CM | POA: Insufficient documentation

## 2016-04-13 DIAGNOSIS — I87331 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 lower extremity: Secondary | ICD-10-CM | POA: Diagnosis not present

## 2016-04-13 DIAGNOSIS — R601 Generalized edema: Secondary | ICD-10-CM | POA: Insufficient documentation

## 2016-04-13 NOTE — Progress Notes (Signed)
*  PRELIMINARY RESULTS* Echocardiogram 2D Echocardiogram has been performed.  Cristela Blue 04/13/2016, 11:27 AM

## 2016-04-15 NOTE — Progress Notes (Signed)
[name], [name] ([id number]) Visit Report for 04/13/2016 Arrival Information Details Patient Name: [name], [name] Date of Service: 04/13/2016 12:45 PM Medical Record Patient Account Number: [id number] Number: Treating RN: Curtis Sites Jan 11, 1956 (60 y.o. Other Clinician: Date of Birth/Sex: Female) Treating ROBSON, MICHAEL Primary Care Physician: Sherrie Mustache Physician/Extender: G Referring Physician: Everette Rank in Treatment: 16 Visit Information History Since Last Visit Added or deleted any medications: No Patient Arrived: Ambulatory Any new allergies or adverse reactions: No Arrival Time: 13:04 Had a fall or experienced change in No Accompanied By: friend activities of daily living that may affect Transfer Assistance: None risk of falls: Patient Identification Verified: Yes Signs or symptoms of abuse/neglect since last No Secondary Verification Process Yes visito Completed: Hospitalized since last visit: No Patient Requires Transmission-Based No Pain Present Now: No Precautions: Patient Has Alerts: No Electronic Signature(s) Signed: 04/13/2016 3:49:08 PM By: Curtis Sites Entered By: Curtis Sites on 04/13/2016 13:05:04 [name], [name] ([id number]) -------------------------------------------------------------------------------- Complex / Palliative Patient Assessment Details Patient Name: [name], [name] Date of Service: 04/13/2016 12:45 PM Medical Record Patient Account Number: [id number] Number: Treating RN: Huel Coventry 09-19-1955 (60 y.o. Other Clinician: Date of Birth/Sex: Female) Treating ROBSON, MICHAEL Primary Care Physician: Sherrie Mustache Physician/Extender: G Referring Physician: Everette Rank in Treatment: 16 Palliative Management Criteria Complex Wound Management Criteria Patient has remarkable or complex co-morbidities requiring medications or treatments that extend wound healing times.  Examples: o Diabetes mellitus with chronic renal failure or end stage renal disease requiring dialysis o Advanced or poorly controlled rheumatoid arthritis o Diabetes mellitus and end stage chronic obstructive pulmonary disease o Active cancer with current chemo- or radiation therapy Lupus; prednisone Care Approach Wound Care Plan: Complex Wound Management Electronic Signature(s) Signed: 04/13/2016 12:10:48 PM By: Elliot Gurney RN, [name], [name], [name] Signed: 04/14/2016 4:32:31 PM By: [name] Entered By: Elliot Gurney, RN, [name], Kim on 04/13/2016 12:10:48 [name] ([id number]) -------------------------------------------------------------------------------- Encounter Discharge Information Details Patient Name: [name], [name] Date of Service: 04/13/2016 12:45 PM Medical Record Patient Account Number: [id number] Number: Treating RN: Curtis Sites 02-07-1956 (60 y.o. Other Clinician: Date of Birth/Sex: Female) Treating ROBSON, MICHAEL Primary Care Physician: Sherrie Mustache Physician/Extender: G Referring Physician: Everette Rank in Treatment: 16 Encounter Discharge Information Items Discharge Pain Level: 0 Discharge Condition: Stable Ambulatory Status: Ambulatory Discharge Destination: Home Transportation: Private Auto Accompanied By: friend Schedule Follow-up Appointment: Yes Medication Reconciliation completed and provided to Patient/Care No [name]: Provided on Clinical Summary of Care: 04/13/2016 Form Type Recipient Paper Patient GC Electronic Signature(s) Signed: 04/13/2016 1:42:12 PM By: Gwenlyn Perking Entered By: Gwenlyn Perking on 04/13/2016 13:42:12 [name], [name] ([id number]) -------------------------------------------------------------------------------- Lower Extremity Assessment Details Patient Name: [name], [name] Date of Service: 04/13/2016 12:45 PM Medical Record Patient Account Number:  [id number] Number: Treating RN: Curtis Sites 08-Jan-1956 (60 y.o. Other Clinician: Date of Birth/Sex: Female) Treating ROBSON, MICHAEL Primary Care Physician: Sherrie Mustache Physician/Extender: G Referring Physician: Everette Rank in Treatment: 16 Edema Assessment Assessed: [Left: No] [Right: No] Edema: [Left: Ye] [Right: s] Calf Left: Right: Point of Measurement: 31 cm From Medial Instep cm cm Ankle Left: Right: Point of Measurement: 11 cm From Medial Instep cm cm Vascular Assessment Pulses: Posterior Tibial Dorsalis Pedis Palpable: [Left:Yes] Extremity colors, hair growth, and conditions: Extremity Color: [Left:Hyperpigmented] Hair Growth on Extremity: [Left:No] Temperature of Extremity: [Left:Warm] Capillary Refill: [Left:< 3 seconds] Electronic Signature(s) Signed: 04/13/2016 1:20:24 PM By: Curtis Sites Entered By: Curtis Sites on 04/13/2016 13:20:24 [name], [name] (  [id number]) -------------------------------------------------------------------------------- Multi Wound Chart Details Patient Name: [name], [name] Date of Service: 04/13/2016 12:45 PM Medical Record Patient Account Number: [id number] Number: Treating RN: Curtis Sites 1955/08/19 (60 y.o. Other Clinician: Date of Birth/Sex: Female) Treating ROBSON, MICHAEL Primary Care Physician: Sherrie Mustache Physician/Extender: G Referring Physician: Everette Rank in Treatment: 16 Vital Signs Height(in): 62 [name](bpm): 94 Weight(lbs): 170 Blood Pressure 177/79 (mmHg): Body Mass Index(BMI): 31 Temperature(F): 98.2 Respiratory Rate 18 (breaths/min): Photos: [1:No Photos] [N/A:N/A] Wound Location: [1:Right Lower Leg - Medial] [N/A:N/A] Wounding Event: [1:Blister] [N/A:N/A] Primary Etiology: [1:Venous Leg Ulcer] [N/A:N/A] Date Acquired: [1:12/15/2015] [N/A:N/A] Weeks of Treatment: [1:16] [N/A:N/A] Wound Status: [1:Open] [N/A:N/A] Measurements L x W x D  1.7x1.1x0.1 [N/A:N/A] (cm) Area (cm) : [1:1.469] [N/A:N/A] Volume (cm) : [1:0.147] [N/A:N/A] % Reduction in Area: [1:94.30%] [N/A:N/A] % Reduction in Volume: 94.30% [N/A:N/A] Classification: [1:Partial Thickness] [N/A:N/A] Exudate Amount: [1:Large] [N/A:N/A] Exudate Type: [1:Serosanguineous] [N/A:N/A] Exudate Color: [1:red, brown] [N/A:N/A] Wound Margin: [1:Flat and Intact] [N/A:N/A] Granulation Amount: [1:Large (67-100%)] [N/A:N/A] Granulation Quality: [1:Red, Hyper-granulation, Friable] [N/A:N/A] Necrotic Amount: [1:None Present (0%)] [N/A:N/A] Exposed Structures: [1:Fascia: No Fat: No Tendon: No Muscle: No Joint: No Bone: No] [N/A:N/A] Limited to Skin Breakdown Epithelialization: None N/A N/A Periwound Skin Texture: Friable: Yes N/A N/A Edema: No Excoriation: No Induration: No Callus: No Crepitus: No Fluctuance: No Rash: No Scarring: No Periwound Skin Moist: Yes N/A N/A Moisture: Maceration: No Dry/Scaly: No Periwound Skin Color: Hemosiderin Staining: Yes N/A N/A Atrophie Blanche: No Cyanosis: No Ecchymosis: No Erythema: No Mottled: No Pallor: No Rubor: No Temperature: No Abnormality N/A N/A Tenderness on Yes N/A N/A Palpation: Wound Preparation: Ulcer Cleansing: Other: N/A N/A soap and water Topical Anesthetic Applied: Other: lidocaine 4% Treatment Notes Electronic Signature(s) Signed: 04/13/2016 1:21:12 PM By: Curtis Sites Entered By: Curtis Sites on 04/13/2016 13:21:12 Wigley, [name] ([id number]) -------------------------------------------------------------------------------- Multi-Disciplinary Care Plan Details Patient Name: [name], [name] Date of Service: 04/13/2016 12:45 PM Medical Record Patient Account Number: [id number] Number: Treating RN: Curtis Sites [birth year] (60 y.o. Other Clinician: Date of Birth/Sex: Female) Treating ROBSON, MICHAEL Primary Care Physician: Sherrie Mustache Physician/Extender: G Referring  Physician: Everette Rank in Treatment: 16 Active Inactive Abuse / Safety / Falls / Self Care Management Nursing Diagnoses: Impaired physical mobility Potential for falls Goals: Patient will remain injury free Date Initiated: 12/23/2015 Goal Status: Active Interventions: Assess fall risk on admission and as needed Notes: Nutrition Nursing Diagnoses: Imbalanced nutrition Goals: Patient/caregiver verbalizes understanding of need to maintain therapeutic glucose control per primary care physician Date Initiated: 12/23/2015 Goal Status: Active Interventions: Provide education on nutrition Treatment Activities: Education provided on Nutrition : 01/21/2016 Notes: Orientation to the Wound Care Program GAIA, TETRAULT ([id number]) Nursing Diagnoses: Knowledge deficit related to the wound healing center program Goals: Patient/caregiver will verbalize understanding of the Wound Healing Center Program Date Initiated: 12/23/2015 Goal Status: Active Interventions: Provide education on orientation to the wound center Notes: Venous Leg Ulcer Nursing Diagnoses: Potential for venous Insuffiency (use before diagnosis confirmed) Goals: Patient will maintain optimal edema control Date Initiated: 12/23/2015 Goal Status: Active Interventions: Provide education on venous insufficiency Notes: Wound/Skin Impairment Nursing Diagnoses: Impaired tissue integrity Goals: Ulcer/skin breakdown will heal within 14 weeks Date Initiated: 12/23/2015 Goal Status: Active Interventions: Assess ulceration(s) every visit Notes: Electronic Signature(s) Signed: 04/13/2016 1:21:04 PM By: Curtis Sites Entered By: Curtis Sites on 04/13/2016 13:21:03 [name], [name] ([id number]) [name], [name] ([id number]) -------------------------------------------------------------------------------- Pain Assessment Details Patient Name: [name], [name] Date of Service: 04/13/2016 12:45 PM Medical  Record Patient Account Number: [id number] Number: Treating RN: Curtis Sites [birth year] (60 y.o. Other Clinician: Date of Birth/Sex: Female) Treating ROBSON, MICHAEL Primary Care Physician: Sherrie Mustache Physician/Extender: G Referring Physician: Everette Rank in Treatment: 16 Active Problems Location of Pain Severity and Description of Pain Patient Has Paino No Site Locations Pain Management and Medication Current Pain Management: Notes Topical or injectable lidocaine is offered to patient for acute pain when surgical debridement is performed. If needed, Patient is instructed to use over the counter pain medication for the following 24-48 hours after debridement. Wound care MDs do not prescribed pain medications. Patient has chronic pain or uncontrolled pain. Patient has been instructed to make an appointment with their Primary Care Physician for pain management. Electronic Signature(s) Signed: 04/13/2016 3:49:08 PM By: Curtis Sites Entered By: Curtis Sites on 04/13/2016 13:05:43 [name], [name] ([id number]) -------------------------------------------------------------------------------- Patient/Caregiver Education Details Patient Name: [name], [name] Date of Service: 04/13/2016 12:45 PM Medical Record Patient Account Number: [id number] Number: Treating RN: Curtis Sites 01-Aug-1956 (60 y.o. Other Clinician: Date of Birth/Gender: Female) Treating ROBSON, MICHAEL Primary Care Physician: Sherrie Mustache Physician/Extender: G Referring Physician: Everette Rank in Treatment: 16 Education Assessment Education Provided To: Patient and Caregiver Education Topics Provided Venous: Handouts: Other: proper application of juxtalite wrap Methods: Demonstration, Explain/Verbal Responses: State content correctly Electronic Signature(s) Signed: 04/13/2016 3:49:08 PM By: Curtis Sites Entered By: Curtis Sites on 04/13/2016  13:21:37 [name], [name] ([id number]) -------------------------------------------------------------------------------- Wound Assessment Details Patient Name: Mangano, Solaris L. Date of Service: 04/13/2016 12:45 PM Medical Record Patient Account Number: [id number] Number: Treating RN: Curtis Sites 1956-04-26 (60 y.o. Other Clinician: Date of Birth/Sex: Female) Treating ROBSON, MICHAEL Primary Care Physician: Sherrie Mustache Physician/Extender: G Referring Physician: Everette Rank in Treatment: 16 Wound Status Wound Number: 1 Primary Etiology: Venous Leg Ulcer Wound Location: Right Lower Leg - Medial Wound Status: Open Wounding Event: Blister Date Acquired: 12/15/2015 Weeks Of Treatment: 16 Clustered Wound: No Photos Wound Measurements Length: (cm) 1.7 Width: (cm) 1.1 Depth: (cm) 0.1 Area: (cm) 1.469 Volume: (cm) 0.147 % Reduction in Area: 94.3% % Reduction in Volume: 94.3% Epithelialization: None Tunneling: No Undermining: No Wound Description Classification: Partial Thickness Wound Margin: Flat and Intact Exudate Amount: Large Exudate Type: Serosanguineous Exudate Color: red, brown Foul Odor After Cleansing: No Wound Bed Granulation Amount: Large (67-100%) Exposed Structure Granulation Quality: Red, Hyper-granulation, Friable Fascia Exposed: No Necrotic Amount: None Present (0%) Fat Layer Exposed: No Siska, Sofi L. ([id number]) Tendon Exposed: No Muscle Exposed: No Joint Exposed: No Bone Exposed: No Limited to Skin Breakdown Periwound Skin Texture Texture Color No Abnormalities Noted: No No Abnormalities Noted: No Callus: No Atrophie Blanche: No Crepitus: No Cyanosis: No Excoriation: No Ecchymosis: No Fluctuance: No Erythema: No Friable: Yes Hemosiderin Staining: Yes Induration: No Mottled: No Localized Edema: No Pallor: No Rash: No Rubor: No Scarring: No Temperature / Pain Moisture Temperature: No Abnormality No  Abnormalities Noted: No Tenderness on Palpation: Yes Dry / Scaly: No Maceration: No Moist: Yes Wound Preparation Ulcer Cleansing: Other: soap and water, Topical Anesthetic Applied: Other: lidocaine 4%, Treatment Notes Wound #1 (Right, Medial Lower Leg) 1. Cleansed with: Cleanse wound with antibacterial soap and water 2. Anesthetic Topical Lidocaine 4% cream to wound bed prior to debridement 4. Dressing Applied: Hydrafera Blue 5. Secondary Dressing Applied ABD Pad 7. Secured with 4-Layer Compression System - Right Lower Extremity Notes unna to anchor Electronic Signature(s) Signed: 04/13/2016 3:49:08 PM By: Curtis Sites Entered By: Curtis Sites on 04/13/2016 13:27:35 Ems,  CHARLYNN SALIH ([id number]) Weins, [name] ([id number]) -------------------------------------------------------------------------------- Vitals Details Patient Name: Winkleman, Cesily L. Date of Service: 04/13/2016 12:45 PM Medical Record Patient Account Number: [id number] Number: Treating RN: Curtis Sites 1956-04-03 (60 y.o. Other Clinician: Date of Birth/Sex: Female) Treating ROBSON, MICHAEL Primary Care Physician: Sherrie Mustache Physician/Extender: G Referring Physician: Everette Rank in Treatment: 16 Vital Signs Time Taken: 13:05 Temperature (F): 98.2 Height (in): 62 [name] (bpm): 94 Weight (lbs): 170 Respiratory Rate (breaths/min): 18 Body Mass Index (BMI): 31.1 Blood Pressure (mmHg): 177/79 Reference Range: 80 - 120 mg / dl Electronic Signature(s) Signed: 04/13/2016 3:49:08 PM By: Curtis Sites Entered By: Curtis Sites on 04/13/2016 13:06:06

## 2016-04-15 NOTE — Progress Notes (Signed)
EMELI, GOGUEN ([id number]) Visit Report for 04/13/2016 Chief Complaint Document Details Dollins, Hailey Date of Service: 04/13/2016 12:45 PM Patient Name: [name] Account Number: [id number] Medical Record Treating RN: Curtis Sites [id number] Number: Other Clinician: 01/22/1956 (60 y.o. Treating Demere Dotzler Date of Birth/Sex: Female) Physician/Extender: G Primary Care [name], [facility] Physician: Referring Physician: Everette Rank in Treatment: 16 Information Obtained from: Patient Chief Complaint Patient is here for review of a fairly substantial wound on her right medial lower leg that is been present for the last week Electronic Signature(s) Signed: 04/14/2016 4:32:31 PM By: [name] Entered By: Baltazar Najjar on 04/13/2016 13:30:34 Cabral, [name] ([id number]) -------------------------------------------------------------------------------- HPI Details [name] Date of Service: 04/13/2016 12:45 PM Patient Name: [name] Account Number: [id number] Medical Record Treating RN: Curtis Sites [id number] Number: Other Clinician: 1956/04/06 (60 y.o. Treating Mylasia Vorhees Date of Birth/Sex: Female) Physician/Extender: G Primary Care [name], [facility] Physician: Referring Physician: Everette Rank in Treatment: 16 History of Present Illness HPI Description: 12/23/15; this is a 60 year old lady who recently relocated back to [place] from [place]. She is staying with a family friend previously lived with a daughter in [place]. 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 She is on chronic prednisone she is not a diabetic. Dose of prednisone is 10 mg. The patient tells me that roughly a week ago she developed a fairly substantial blister over this area that then ruptured. She was seen in the emergency room on 5/7 an x-ray of the leg was negative she was put on Septra  although I don't think any cultures were done. She also tells me she has had chronic edema in her legs for a number of years. She had a similar presentation to currently in the right lateral lower leg roughly a year ago that she was able to heal on her own. As noted she has systemic lupus but does not have lupus nephritis according to the patient. She has a lot of edema in her lower legs. The ABI's could not be obtained. 12/31/15; the patient's insurance which is [place] makes her out of network for any home health therefore we have been changing her dressing in our facility. I reviewed her trip to the ER on 12/21/2015 her creatinine was within the normal range hemoglobin and white count normal. Culture of the wound was negative. X-ray of the leg showed soft tissue edema no foreign bodies. We have been dressing this with Aquacel Ag due to the amount of ongoing drainage 01/07/16; the patient had her arterial studies this morning I don't have these results area she comes back in to have [place] rewrap since she doesn't have access to home health 01/14/16 I still do not have the results of her arterial studies. Our nurse correctly pointed out that we've been wrapping the left leg without an open wound I think this had to do with the fact that she had so much edema when she came in I was concerned about leaving this unwrapped. 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 We left her left leg unwrapped last week as she has no open wound here today she has extensive edema here. We did order stockings however I believe the company called but they have not called them back. She comes back in today with  extensive edema in the left leg. We have her arterial studies which showed triphasic waves and all locations tested including her posterior tibial and anterior tibial  arteries bilaterally. Report listed no hemodynamically significant bilateral lower extremity arterial stenosis. She has her venous studies later this month. I'm going to rewrap the left leg due to the extent of the edema, transitioning her into the stocking we ordered last week as soon as one is available 01/27/16 the patient arrived today with pooled serosanguineous drainage over the wound it would not absorb into her Aquacel Ag which is somewhat on. In spite of this the dimensions of her wound are down Champeau, Raesha L. ([id number]) considerably and the wound bed looks healthy without any need for debridement. She is obtained her juxtalite stockings the left leg which has no open wound. We are waiting for the right leg wound to get smaller before ordering her one for the right leg 02/03/16; patient wound looked improved. No debridement was required. We've been using the juxtalite stocking on the left leg which is no open wound. 02/11/16 wound is remarkably better. No debridement was required. Healthy rim of epithelialization. We have been using Aquacel Ag, we'll try to close this down with RTD over the next 2 weeks 02/18/16 wound continues to improve down a centimeter in length and width. No debridement was required. Advancing epithelialization. We started RTD last week 02/25/16: pt brought juxtalite for right lower leg today. nurse reports RTD was dry and adhered to the wound. required removal by NS. wound bed with 100% healthy red granulation tissue. denies systemic s/s of infection. 03/02/16; Considerable improvement. no debridement. 03/09/16; wound bed is very vascular no major change in dimensions. Continuing with RTD 03/16/16; unfortunately no major change in dimensions however the wound bed looks very healthy. I will continue with RTD for one more week. If his stalls consider change to collagen. 03/30/2016 -- she's been using juxta lites on her left lower extremity but her lymphedema is  very significantly increased and she is not tolerating the juxta lites the edema has increased symptoms significantly 04/06/16; apparently the RTD started sticking to the wound therefore she was changed to sorbact. Her 4-layer wrap appears to have slipped one third of the way down her leg. She was tolerating the juxtalite on the left leg, I don't see the need to wrap the left leg. She is not a candidate for external compression pumps secondary to insurance issues. 04/13/16; wound is actually measuring larger. Has been using Building surveyor) Signed: 04/14/2016 4:32:31 PM By: [name] Entered By: Baltazar Najjar on 04/13/2016 13:32:44 Boze, [name] ([id number]) -------------------------------------------------------------------------------- Physical Exam Details [name] Date of Service: 04/13/2016 12:45 PM Patient Name: [name] Account Number: [id number] Medical Record Treating RN: Curtis Sites [id number] Number: Other Clinician: Jun 14, 1956 (60 y.o. Treating Duanna Runk Date of Birth/Sex: Female) Physician/Extender: G Primary Care [name], [facility] Physician: Referring Physician: Everette Rank in Treatment: 16 Constitutional Patient is hypertensive.. Pulse regular and within target range for patient.Marland Kitchen Respirations regular, non-labored and within target range.. Temperature is normal and within the target range for the patient.. Patient's appearance is neat and clean. Appears in no acute distress. Well nourished and well developed.. Eyes Conjunctivae clear. No discharge.Marland Kitchen Respiratory Respiratory effort is easy and symmetric bilaterally. Rate is normal at rest and on room air.. Bilateral breath sounds are clear and equal in all lobes with no wheezes, rales or rhonchi.. Cardiovascular Heart rhythm and rate regular, without murmur or gallop. No  signs of fluid overload. Pedal pulses palpable and strong bilaterally.. Edema present in both  extremities. Significant lower extremity edema combination of lymphedema and venous insufficiency. Notes Wound exam; the right leg wound is measuring larger although the surface of the wound appears stable no debridement is required. She has venous insufficiency and inflammation and secondary lymphedema. There is no evidence of infection. She is not putting the juxtalite on the left leg correctly very significant edema secondary to the combination of venous insufficiency and lymphedema Electronic Signature(s) Signed: 04/14/2016 4:32:31 PM By: [name] Entered By: Baltazar Najjar on 04/13/2016 13:35:02 Olmsted, [name] ([id number]) -------------------------------------------------------------------------------- Physician Orders Details [name] Date of Service: 04/13/2016 12:45 PM Patient Name: [name] Account Number: [id number] Medical Record Treating RN: Curtis Sites [id number] Number: Other Clinician: 1956-02-02 (60 y.o. Treating Kayleen Alig Date of Birth/Sex: Female) Physician/Extender: G Primary Care [name], [facility] Physician: Referring Physician: Everette Rank in Treatment: 39 Verbal / Phone Orders: Yes Clinician: Curtis Sites Read Back and Verified: Yes Diagnosis Coding Wound Cleansing Wound #1 Right,Medial Lower Leg o Cleanse wound with mild soap and water Skin Barriers/Peri-Wound Care Wound #1 Right,Medial Lower Leg o Moisturizing lotion Primary Wound Dressing Wound #1 Right,Medial Lower Leg o Hydrafera Blue Secondary Dressing Wound #1 Right,Medial Lower Leg o ABD pad Dressing Change Frequency Wound #1 Right,Medial Lower Leg o Change dressing every week Follow-up Appointments Wound #1 Right,Medial Lower Leg o Return Appointment in 1 week. Edema Control Wound #1 Right,Medial Lower Leg o 4-Layer Compression System - Right Lower Extremity - unna to anchor Additional Orders / Instructions Wound #1 Right,Medial  Lower Leg o Increase protein intake. LIZZA, HUFFAKER ([id number]) Electronic Signature(s) Signed: 04/13/2016 3:49:08 PM By: Curtis Sites Signed: 04/14/2016 4:32:31 PM By: [name] Entered By: Curtis Sites on 04/13/2016 84:13:24 ANAEL, ROSCH ([id number]) -------------------------------------------------------------------------------- Problem List Details [name] Date of Service: 04/13/2016 12:45 PM Patient Name: [name] Account Number: [id number] Medical Record Treating RN: Curtis Sites [id number] Number: Other Clinician: 10-Dec-1955 (60 y.o. Treating Latika Kronick Date of Birth/Sex: Female) Physician/Extender: G Primary Care [name], [facility] Physician: Referring Physician: Everette Rank in Treatment: 16 Active Problems ICD-10 Encounter Code Description Active Date Diagnosis I87.331 Chronic venous hypertension (idiopathic) with ulcer and 12/23/2015 Yes inflammation of right lower extremity I87.322 Chronic venous hypertension (idiopathic) with 12/23/2015 Yes inflammation of left lower extremity M32.10 Systemic lupus erythematosus, organ or system 12/23/2015 Yes involvement unspecified L97.211 Non-pressure chronic ulcer of right calf limited to 03/02/2016 Yes breakdown of skin Inactive Problems Resolved Problems Electronic Signature(s) Signed: 04/14/2016 4:32:31 PM By: [name] Entered By: Baltazar Najjar on 04/13/2016 13:30:14 Popiel, [name] ([id number]) -------------------------------------------------------------------------------- Progress Note Details [name] Date of Service: 04/13/2016 12:45 PM Patient Name: [name] Account Number: [id number] Medical Record Treating RN: Curtis Sites [id number] Number: Other Clinician: [birth year] (60 y.o. Treating Laterica Matarazzo Date of Birth/Sex: Female) Physician/Extender: G Primary Care [name], [facility] Physician: Referring Physician: Everette Rank in  Treatment: 16 Subjective Chief Complaint Information obtained from Patient Patient is here for review of a fairly substantial wound on her right medial lower leg that is been present for the last week History of Present Illness (HPI) 12/23/15; this is a 61 year old lady who recently relocated back to [place] from [place]. She is staying with a family friend previously lived with a daughter in [place]. 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 She is on chronic prednisone she  is not a diabetic. Dose of prednisone is 10 mg. The patient tells me that roughly a week ago she developed a fairly substantial blister over this area that then ruptured. She was seen in the emergency room on 5/7 an x-ray of the leg was negative she was put on Septra although I don't think any cultures were done. She also tells me she has had chronic edema in her legs for a number of years. She had a similar presentation to currently in the right lateral lower leg roughly a year ago that she was able to heal on her own. As noted she has systemic lupus but does not have lupus nephritis according to the patient. She has a lot of edema in her lower legs. The ABI's could not be obtained. 12/31/15; the patient's insurance which is [place] makes her out of network for any home health therefore we have been changing her dressing in our facility. I reviewed her trip to the ER on 12/21/2015 her creatinine was within the normal range hemoglobin and white count normal. Culture of the wound was negative. X-ray of the leg showed soft tissue edema no foreign bodies. We have been dressing this with Aquacel Ag due to the amount of ongoing drainage 01/07/16; the patient had her arterial studies this morning I don't have these results area she comes back in to have [place] rewrap since she doesn't have access to home health 01/14/16 I still do not have the results of her  arterial studies. Our nurse correctly pointed out that we've been wrapping the left leg without an open wound I think this had to do with the fact that she had so much edema when she came in I was concerned about leaving this unwrapped. 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 We left her left leg unwrapped last week as she has no open wound here today she has extensive edema here. We did order stockings however I believe the company called but they have not called them Gregori, Akeylah L. ([id number]) back. She comes back in today with extensive edema in the left leg. We have her arterial studies which showed triphasic waves and all locations tested including her posterior tibial and anterior tibial arteries bilaterally. Report listed no hemodynamically significant bilateral lower extremity arterial stenosis. She has her venous studies later this month. I'm going to rewrap the left leg due to the extent of the edema, transitioning her into the stocking we ordered last week as soon as one is available 01/27/16 the patient arrived today with pooled serosanguineous drainage over the wound it would not absorb into her Aquacel Ag which is somewhat on. In spite of this the dimensions of her wound are down considerably and the wound bed looks healthy without any need for debridement. She is obtained her juxtalite stockings the left leg which has no open wound. We are waiting for the right leg wound to get smaller before ordering her one for the right leg 02/03/16; patient wound looked improved. No debridement was required. We've been using the juxtalite stocking on the left leg which is no open wound. 02/11/16 wound is remarkably better. No debridement was required. Healthy rim of epithelialization. We have been using Aquacel Ag, we'll try to close this down with RTD over the  next 2 weeks 02/18/16 wound continues to improve down a centimeter in length  and width. No debridement was required. Advancing epithelialization. We started RTD last week 02/25/16: pt brought juxtalite for right lower leg today. nurse reports RTD was dry and adhered to the wound. required removal by NS. wound bed with 100% healthy red granulation tissue. denies systemic s/s of infection. 03/02/16; Considerable improvement. no debridement. 03/09/16; wound bed is very vascular no major change in dimensions. Continuing with RTD 03/16/16; unfortunately no major change in dimensions however the wound bed looks very healthy. I will continue with RTD for one more week. If his stalls consider change to collagen. 03/30/2016 -- she's been using juxta lites on her left lower extremity but her lymphedema is very significantly increased and she is not tolerating the juxta lites the edema has increased symptoms significantly 04/06/16; apparently the RTD started sticking to the wound therefore she was changed to sorbact. Her 4-layer wrap appears to have slipped one third of the way down her leg. She was tolerating the juxtalite on the left leg, I don't see the need to wrap the left leg. She is not a candidate for external compression pumps secondary to insurance issues. 04/13/16; wound is actually measuring larger. Has been using Sorbact Objective Constitutional Patient is hypertensive.. Pulse regular and within target range for patient.Marland Kitchen. Respirations regular, non-labored and within target range.. Temperature is normal and within the target range for the patient.. Patient's appearance is neat and clean. Appears in no acute distress. Well nourished and well developed.. Vitals Time Taken: 1:05 PM, Height: 62 in, Weight: 170 lbs, BMI: 31.1, Temperature: 98.2 F, Pulse: 94 bpm, Respiratory Rate: 18 breaths/min, Blood Pressure: 177/79 mmHg. Eyes Welch, Alexzandrea L. ([id number]) Conjunctivae clear. No  discharge.Marland Kitchen. Respiratory Respiratory effort is easy and symmetric bilaterally. Rate is normal at rest and on room air.. Bilateral breath sounds are clear and equal in all lobes with no wheezes, rales or rhonchi.. Cardiovascular Heart rhythm and rate regular, without murmur or gallop. No signs of fluid overload. Pedal pulses palpable and strong bilaterally.. Edema present in both extremities. Significant lower extremity edema combination of lymphedema and venous insufficiency. General Notes: Wound exam; the right leg wound is measuring larger although the surface of the wound appears stable no debridement is required. She has venous insufficiency and inflammation and secondary lymphedema. There is no evidence of infection. She is not putting the juxtalite on the left leg correctly very significant edema secondary to the combination of venous insufficiency and lymphedema Integumentary (Hair, Skin) Wound #1 status is Open. Original cause of wound was Blister. The wound is located on the Right,Medial Lower Leg. The wound measures 1.7cm length x 1.1cm width x 0.1cm depth; 1.469cm^2 area and 0.147cm^3 volume. The wound is limited to skin breakdown. There is no tunneling or undermining noted. There is a large amount of serosanguineous drainage noted. The wound margin is flat and intact. There is large (67-100%) red, friable granulation within the wound bed. There is no necrotic tissue within the wound bed. The periwound skin appearance exhibited: Friable, Moist, Hemosiderin Staining. The periwound skin appearance did not exhibit: Callus, Crepitus, Excoriation, Fluctuance, Induration, Localized Edema, Rash, Scarring, Dry/Scaly, Maceration, Atrophie Blanche, Cyanosis, Ecchymosis, Mottled, Pallor, Rubor, Erythema. Periwound temperature was noted as No Abnormality. The periwound has tenderness on palpation. Assessment Active Problems ICD-10 I87.331 - Chronic venous hypertension (idiopathic) with ulcer  and inflammation of right lower extremity I87.322 - Chronic venous hypertension (idiopathic) with inflammation of left lower extremity M32.10 - Systemic lupus erythematosus, organ or system involvement unspecified L97.211 - Non-pressure chronic ulcer  of right calf limited to breakdown of skin Plan Wound Cleansing: CHIVON, LEPAGE ([id number]) Wound #1 Right,Medial Lower Leg: Cleanse wound with mild soap and water Skin Barriers/Peri-Wound Care: Wound #1 Right,Medial Lower Leg: Moisturizing lotion Primary Wound Dressing: Wound #1 Right,Medial Lower Leg: Hydrafera Blue Secondary Dressing: Wound #1 Right,Medial Lower Leg: ABD pad Dressing Change Frequency: Wound #1 Right,Medial Lower Leg: Change dressing every week Follow-up Appointments: Wound #1 Right,Medial Lower Leg: Return Appointment in 1 week. Edema Control: Wound #1 Right,Medial Lower Leg: 4-Layer Compression System - Right Lower Extremity - unna to anchor Additional Orders / Instructions: Wound #1 Right,Medial Lower Leg: Increase protein intake. changed the dressing to hydroferal blue alot of edema, not applying the juxtalite properly on the left no evidence of systemic fluid overload Electronic Signature(s) Signed: 04/14/2016 4:32:31 PM By: [name] Entered By: Baltazar Najjar on 04/13/2016 13:37:48 Rathe, [name] ([id number]) -------------------------------------------------------------------------------- SuperBill Details Birt, Avah Date of Service: 04/13/2016 Patient Name: [name] Account Number: [id number] Medical Record Treating RN: Curtis Sites [id number] Number: Other Clinician: 1955-12-16 (59 y.o. Treating Demaris Leavell Date of Birth/Sex: Female) Physician/Extender: G Primary Care Weeks in Treatment: 43 [name], FAYEGH Physician: Referring Physician: Phineas Semen Diagnosis Coding ICD-10 Codes Code Description Chronic venous hypertension (idiopathic) with ulcer and  inflammation of right lower I87.331 extremity I87.322 Chronic venous hypertension (idiopathic) with inflammation of left lower extremity M32.10 Systemic lupus erythematosus, organ or system involvement unspecified L97.211 Non-pressure chronic ulcer of right calf limited to breakdown of skin Facility Procedures CPT4: Description Modifier Quantity Code [id number] (Facility Use Only) [phone] - APPLY MULTLAY COMPRS LWR LT 1 LEG Physician Procedures CPT4: Description Modifier Quantity Code [id number] 99213 - WC PHYS LEVEL 3 - EST PT 1 ICD-10 Description Diagnosis I87.331 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 lower extremity Electronic Signature(s) Signed: 04/13/2016 3:49:08 PM By: Curtis Sites Signed: 04/14/2016 4:32:31 PM By: [name] Entered By: Curtis Sites on 04/13/2016 13:40:18

## 2016-04-20 ENCOUNTER — Encounter: Payer: Medicare Other | Attending: Internal Medicine | Admitting: Internal Medicine

## 2016-04-20 DIAGNOSIS — Z79899 Other long term (current) drug therapy: Secondary | ICD-10-CM | POA: Diagnosis not present

## 2016-04-20 DIAGNOSIS — I10 Essential (primary) hypertension: Secondary | ICD-10-CM | POA: Diagnosis not present

## 2016-04-20 DIAGNOSIS — R600 Localized edema: Secondary | ICD-10-CM | POA: Insufficient documentation

## 2016-04-20 DIAGNOSIS — M3210 Systemic lupus erythematosus, organ or system involvement unspecified: Secondary | ICD-10-CM | POA: Diagnosis not present

## 2016-04-20 DIAGNOSIS — I87322 Chronic venous hypertension (idiopathic) with inflammation of left lower extremity: Secondary | ICD-10-CM | POA: Insufficient documentation

## 2016-04-20 DIAGNOSIS — Z8673 Personal history of transient ischemic attack (TIA), and cerebral infarction without residual deficits: Secondary | ICD-10-CM | POA: Diagnosis not present

## 2016-04-20 DIAGNOSIS — Z7952 Long term (current) use of systemic steroids: Secondary | ICD-10-CM | POA: Diagnosis not present

## 2016-04-20 DIAGNOSIS — I87331 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 lower extremity: Secondary | ICD-10-CM | POA: Diagnosis present

## 2016-04-20 DIAGNOSIS — L97211 Non-pressure chronic ulcer of right calf limited to breakdown of skin: Secondary | ICD-10-CM | POA: Insufficient documentation

## 2016-04-21 NOTE — Progress Notes (Signed)
[name] ([id number]) Visit Report for 04/20/2016 Arrival Information Details Patient Name: [name], [name] Date of Service: 04/20/2016 2:00 PM Medical Record Patient Account Number: [id number] Number: Treating RN: Clover Mealy, RN, [name], Rita May 03, 1956 [phone] y.o. Other Clinician: Date of Birth/Sex: Female) Treating ROBSON, MICHAEL Primary Care Physician: Sherrie Mustache Physician/Extender: G Referring Physician: Sherrie Mustache Weeks in Treatment: 17 Visit Information History Since Last Visit All ordered tests and consults were completed: No Patient Arrived: Ambulatory Added or deleted any medications: No Arrival Time: 14:25 Any new allergies or adverse reactions: No Accompanied By: son in lobby Had a fall or experienced change in No activities of daily living that may affect Transfer Assistance: None risk of falls: Patient Identification Verified: Yes Signs or symptoms of abuse/neglect since last No Secondary Verification Process Yes visito Completed: Hospitalized since last visit: No Patient Requires Transmission-Based No Has Dressing in Place as Prescribed: Yes Precautions: Has Compression in Place as Prescribed: Yes Patient Has Alerts: No Pain Present Now: No Electronic Signature(s) Signed: 04/20/2016 4:23:09 PM By: Elpidio Eric [name], RN Entered By: Elpidio Eric on 04/20/2016 14:25:43 Hovsepian, [name] ([id number]) -------------------------------------------------------------------------------- Encounter Discharge Information Details Patient Name: [name], [name] Date of Service: 04/20/2016 2:00 PM Medical Record Patient Account Number: [id number] Number: Treating RN: Clover Mealy, RN, [name], Rita [birth year] [phone] y.o. Other Clinician: Date of Birth/Sex: Female) Treating ROBSON, MICHAEL Primary Care Physician: Sherrie Mustache Physician/Extender: G Referring Physician: Sherrie Mustache Weeks in Treatment: 66 Encounter Discharge Information Items Discharge  Pain Level: 0 Discharge Condition: Stable Ambulatory Status: Ambulatory Discharge Destination: Home Transportation: Private Auto Accompanied By: son Schedule Follow-up Appointment: No Medication Reconciliation completed and provided to Patient/Care No Philomena Buttermore: Provided on Clinical Summary of Care: 04/20/2016 Form Type Recipient Paper Patient GC Electronic Signature(s) Signed: 04/20/2016 4:23:09 PM By: Elpidio Eric [name], RN Previous Signature: 04/20/2016 2:57:17 PM Version By: Gwenlyn Perking Entered By: Elpidio Eric on 04/20/2016 14:59:34 Ekstrand, [name] ([id number]) -------------------------------------------------------------------------------- Lower Extremity Assessment Details Patient Name: [name], [name] Date of Service: 04/20/2016 2:00 PM Medical Record Patient Account Number: [id number] Number: Treating RN: Clover Mealy, RN, [name], Rita [birth year] [phone] y.o. Other Clinician: Date of Birth/Sex: Female) Treating ROBSON, MICHAEL Primary Care Physician: Sherrie Mustache Physician/Extender: G Referring Physician: Sherrie Mustache Weeks in Treatment: 17 Edema Assessment Assessed: [Left: No] [Right: No] Edema: [Left: Yes] [Right: Yes] Calf Left: Right: Point of Measurement: 31 cm From Medial Instep cm cm Ankle Left: Right: Point of Measurement: 11 cm From Medial Instep cm cm Vascular Assessment Claudication: Claudication Assessment [Left:None] [Right:None] Pulses: Posterior Tibial Dorsalis Pedis Palpable: [Left:Yes] [Right:Yes] Extremity colors, hair growth, and conditions: Extremity Color: [Left:Mottled] [Right:Mottled] Hair Growth on Extremity: [Left:No] [Right:No] Temperature of Extremity: [Left:Warm] [Right:Warm] Capillary Refill: [Left:< 3 seconds] [Right:< 3 seconds] Toe Nail Assessment Left: Right: Thick: Yes Yes Discolored: Yes Yes Deformed: No No Improper Length and Hygiene: No No Electronic Signature(s) Signed: 04/20/2016 4:23:09 PM By: Elpidio Eric [name],  RN [name], [name] Entered By: Elpidio Eric on 04/20/2016 14:26:28 [name], [name] -------------------------------------------------------------------------------- Multi Wound Chart Details Patient Name: [name], [name] Date of Service: 04/20/2016 2:00 PM Medical Record Patient Account Number: [id number] Number: Treating RN: Clover Mealy, RN, [name], Rita [birth year] [phone] y.o. Other Clinician: Date of Birth/Sex: Female) Treating ROBSON, MICHAEL Primary Care Physician: Sherrie Mustache Physician/Extender: G Referring Physician: Sherrie Mustache Weeks in Treatment: 17 Vital Signs Height(in): 62 Pulse(bpm): 84 Weight(lbs): 170 Blood Pressure 134/72 (mmHg): Body Mass Index(BMI): 31 Temperature(F): 98.1 Respiratory Rate 18 (breaths/min): Photos: [1:No Photos] [  N/A:N/A] Wound Location: [1:Right Lower Leg - Medial] [N/A:N/A] Wounding Event: [1:Blister] [N/A:N/A] Primary Etiology: [1:Venous Leg Ulcer] [N/A:N/A] Date Acquired: [1:12/15/2015] [N/A:N/A] Weeks of Treatment: [1:17] [N/A:N/A] Wound Status: [1:Open] [N/A:N/A] Measurements L x W x D 2.5x0.7x0.1 [N/A:N/A] (cm) Area (cm) : [1:1.374] [N/A:N/A] Volume (cm) : [1:0.137] [N/A:N/A] % Reduction in Area: [1:94.70%] [N/A:N/A] % Reduction in Volume: 94.70% [N/A:N/A] Classification: [1:Partial Thickness] [N/A:N/A] Exudate Amount: [1:Large] [N/A:N/A] Exudate Type: [1:Serosanguineous] [N/A:N/A] Exudate Color: [1:red, brown] [N/A:N/A] Wound Margin: [1:Flat and Intact] [N/A:N/A] Granulation Amount: [1:Large (67-100%)] [N/A:N/A] Granulation Quality: [1:Red, Hyper-granulation, Friable] [N/A:N/A] Necrotic Amount: [1:None Present (0%)] [N/A:N/A] Exposed Structures: [1:Fascia: No Fat: No Tendon: No Muscle: No Joint: No Bone: No] [N/A:N/A] Limited to Skin Breakdown Epithelialization: Medium (34-66%) N/A N/A Periwound Skin Texture: Friable: Yes N/A N/A Edema: No Excoriation: No Induration: No Callus:  No Crepitus: No Fluctuance: No Rash: No Scarring: No Periwound Skin Moist: Yes N/A N/A Moisture: Maceration: No Dry/Scaly: No Periwound Skin Color: Hemosiderin Staining: Yes N/A N/A Atrophie Blanche: No Cyanosis: No Ecchymosis: No Erythema: No Mottled: No Pallor: No Rubor: No Temperature: No Abnormality N/A N/A Tenderness on Yes N/A N/A Palpation: Wound Preparation: Ulcer Cleansing: Other: N/A N/A soap and water Topical Anesthetic Applied: None Treatment Notes Electronic Signature(s) Signed: 04/20/2016 4:23:09 PM By: Elpidio Eric [name], RN Entered By: Elpidio Eric on 04/20/2016 14:46:21 Derasmo, [name] ([id number]) -------------------------------------------------------------------------------- Multi-Disciplinary Care Plan Details Patient Name: [name], [name] Date of Service: 04/20/2016 2:00 PM Medical Record Patient Account Number: [id number] Number: Treating RN: Clover Mealy, RN, [name], Rita Dec 17, 1955 [phone] y.o. Other Clinician: Date of Birth/Sex: Female) Treating ROBSON, MICHAEL Primary Care Physician: Sherrie Mustache Physician/Extender: G Referring Physician: Sherrie Mustache Weeks in Treatment: 79 Active Inactive Abuse / Safety / Falls / Self Care Management Nursing Diagnoses: Impaired physical mobility Potential for falls Goals: Patient will remain injury free Date Initiated: 12/23/2015 Goal Status: Active Interventions: Assess fall risk on admission and as needed Notes: Nutrition Nursing Diagnoses: Imbalanced nutrition Goals: Patient/caregiver verbalizes understanding of need to maintain therapeutic glucose control per primary care physician Date Initiated: 12/23/2015 Goal Status: Active Interventions: Provide education on nutrition Treatment Activities: Education provided on Nutrition : 01/21/2016 Notes: Orientation to the Wound Care Program ROBY, SPALLA ([id number]) Nursing Diagnoses: Knowledge deficit related to the wound healing center  program Goals: Patient/caregiver will verbalize understanding of the Wound Healing Center Program Date Initiated: 12/23/2015 Goal Status: Active Interventions: Provide education on orientation to the wound center Notes: Venous Leg Ulcer Nursing Diagnoses: Potential for venous Insuffiency (use before diagnosis confirmed) Goals: Patient will maintain optimal edema control Date Initiated: 12/23/2015 Goal Status: Active Interventions: Provide education on venous insufficiency Notes: Wound/Skin Impairment Nursing Diagnoses: Impaired tissue integrity Goals: Ulcer/skin breakdown will heal within 14 weeks Date Initiated: 12/23/2015 Goal Status: Active Interventions: Assess ulceration(s) every visit Notes: Electronic Signature(s) Signed: 04/20/2016 4:23:09 PM By: Elpidio Eric [name], RN Entered By: Elpidio Eric on 04/20/2016 14:46:10 Dorton, [name] Baskin, Luiza Elbert [name] ([id number]) -------------------------------------------------------------------------------- Pain Assessment Details Patient Name: [name], [name] Date of Service: 04/20/2016 2:00 PM Medical Record Patient Account Number: [id number] Number: Treating RN: Clover Mealy, RN, [name], Rita 10/07/1955 [phone] y.o. Other Clinician: Date of Birth/Sex: Female) Treating ROBSON, MICHAEL Primary Care Physician: Sherrie Mustache Physician/Extender: G Referring Physician: Sherrie Mustache Weeks in Treatment: 17 Active Problems Location of Pain Severity and Description of Pain Patient Has Paino No Site Locations With Dressing Change: No Pain Management and Medication Current Pain Management: Electronic Signature(s) Signed: 04/20/2016 4:23:09 PM By: Elpidio Eric [name],  RN Entered By: Elpidio Eric on 04/20/2016 14:25:52 Evrard, [name] ([id number]) -------------------------------------------------------------------------------- Patient/Caregiver Education Details Patient Name: [name] Comber. Date of Service: 04/20/2016  2:00 PM Medical Record Patient Account Number: [id number] Number: Treating RN: Clover Mealy, RN, [name], Rita Oct 29, 1955 [phone] y.o. Other Clinician: Date of Birth/Gender: Female) Treating ROBSON, MICHAEL Primary Care Physician: Sherrie Mustache Physician/Extender: G Referring Physician: Augustin Coupe in Treatment: 17 Education Assessment Education Provided To: Patient Education Topics Provided Nutrition: Methods: Explain/Verbal Responses: State content correctly Venous: Methods: Explain/Verbal Responses: State content correctly Welcome To The [facility]: Methods: Explain/Verbal Responses: State content correctly Wound/Skin Impairment: Methods: Explain/Verbal Responses: State content correctly Electronic Signature(s) Signed: 04/20/2016 4:23:09 PM By: Elpidio Eric [name], RN Entered By: Elpidio Eric on 04/20/2016 14:59:54 Maish, [name] ([id number]) -------------------------------------------------------------------------------- Wound Assessment Details Patient Name: [name], [name] Date of Service: 04/20/2016 2:00 PM Medical Record Patient Account Number: [id number] Number: Treating RN: Clover Mealy, RN, [name], Rita Jun 05, 1956 [phone] y.o. Other Clinician: Date of Birth/Sex: Female) Treating ROBSON, MICHAEL Primary Care Physician: Sherrie Mustache Physician/Extender: G Referring Physician: Sherrie Mustache Weeks in Treatment: 17 Wound Status Wound Number: 1 Primary Etiology: Venous Leg Ulcer Wound Location: Right Lower Leg - Medial Wound Status: Open Wounding Event: Blister Date Acquired: 12/15/2015 Weeks Of Treatment: 17 Clustered Wound: No Photos Photo Uploaded By: Elpidio Eric on 04/21/2016 11:56:01 Wound Measurements Length: (cm) 2.5 Width: (cm) 0.7 Depth: (cm) 0.1 Area: (cm) 1.374 Volume: (cm) 0.137 % Reduction in Area: 94.7% % Reduction in Volume: 94.7% Epithelialization: Medium (34-66%) Tunneling: No Undermining: No Wound  Description Classification: Partial Thickness Wound Margin: Flat and Intact Exudate Amount: Large [name], [name] ([id number]) Foul Odor After Cleansing: No Exudate Type: Serosanguineous Exudate Color: red, brown Wound Bed Granulation Amount: Large (67-100%) Exposed Structure Granulation Quality: Red, Hyper-granulation, Friable Fascia Exposed: No Necrotic Amount: None Present (0%) Fat Layer Exposed: No Tendon Exposed: No Muscle Exposed: No Joint Exposed: No Bone Exposed: No Limited to Skin Breakdown Periwound Skin Texture Texture Color No Abnormalities Noted: No No Abnormalities Noted: No Callus: No Atrophie Blanche: No Crepitus: No Cyanosis: No Excoriation: No Ecchymosis: No Fluctuance: No Erythema: No Friable: Yes Hemosiderin Staining: Yes Induration: No Mottled: No Localized Edema: No Pallor: No Rash: No Rubor: No Scarring: No Temperature / Pain Moisture Temperature: No Abnormality No Abnormalities Noted: No Tenderness on Palpation: Yes Dry / Scaly: No Maceration: No Moist: Yes Wound Preparation Ulcer Cleansing: Other: soap and water, Topical Anesthetic Applied: None Treatment Notes Wound #1 (Right, Medial Lower Leg) 1. Cleansed with: Cleanse wound with antibacterial soap and water 3. Peri-wound Care: Moisturizing lotion 4. Dressing Applied: Hydrafera Blue 7. Secured with 4-Layer Compression System - Right Lower Extremity Notes [name], [name] ([id number]) unna to anchor Electronic Signature(s) Signed: 04/20/2016 4:23:09 PM By: Elpidio Eric [name], RN Entered By: Elpidio Eric on 04/20/2016 14:33:59 Varone, [name] ([id number]) -------------------------------------------------------------------------------- Vitals Details Patient Name: [name], [name] Date of Service: 04/20/2016 2:00 PM Medical Record Patient Account Number: [id number] Number: Treating RN: Clover Mealy, RN, [name], Rita 1956/08/13 [phone] y.o. Other Clinician: Date of  Birth/Sex: Female) Treating ROBSON, MICHAEL Primary Care Physician: Sherrie Mustache Physician/Extender: G Referring Physician: Sherrie Mustache Weeks in Treatment: 17 Vital Signs Time Taken: 14:30 Temperature (F): 98.1 Height (in): 62 Pulse (bpm): 84 Weight (lbs): 170 Respiratory Rate (breaths/min): 18 Body Mass Index (BMI): 31.1 Blood Pressure (mmHg): 134/72 Reference Range: 80 - 120 mg / dl Electronic Signature(s) Signed: 04/20/2016 4:23:09 PM By: Elpidio Eric [name], RN Entered By: Elpidio Eric on  04/20/2016 14:33:25 

## 2016-04-21 NOTE — Progress Notes (Signed)
AIMME, STARZYNSKI ([id number]) Visit Report for 04/20/2016 Chief Complaint Document Details [name] Date of Service: 04/20/2016 2:00 PM Patient Name: [name] Account Number: [id number] Medical Record Treating RN: Clover Mealy RN, [name], [place] Sink [id number] Number: Other Clinician: Apr 01, 1956 (60 y.o. Treating ROBSON, MICHAEL Date of Birth/Sex: Female) Physician/Extender: G Primary Care [name], [facility] Physician: Referring Physician: Sherrie Mustache Weeks in Treatment: 17 Information Obtained from: Patient Chief Complaint Patient is here for review of a fairly substantial wound on her right medial lower leg that is been present for the last week Electronic Signature(s) Signed: 04/21/2016 7:55:14 AM By: [name] Entered By: Baltazar Najjar on 04/20/2016 15:02:33 Struble, [name] ([id number]) -------------------------------------------------------------------------------- HPI Details [name] Date of Service: 04/20/2016 2:00 PM Patient Name: [name] Account Number: [id number] Medical Record Treating RN: Clover Mealy RN, [name], [place] Sink [id number] Number: Other Clinician: 15-Jan-1956 (60 y.o. Treating ROBSON, MICHAEL Date of Birth/Sex: Female) Physician/Extender: G Primary Care [name], [facility] Physician: Referring Physician: Sherrie Mustache Weeks in Treatment: 17 History of Present Illness HPI Description: 12/23/15; this is a 60 year old lady who recently relocated back to [employer] from [place]. She is staying with a family friend previously lived with a daughter in [place]. 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 She is on chronic prednisone she is not a diabetic. Dose of prednisone is 10 mg. The patient tells me that roughly a week ago she developed a fairly substantial blister over this area that then ruptured. She was seen in the emergency room on 5/7 an x-ray of the leg was negative she was put on Septra  although I don't think any cultures were done. She also tells me she has had chronic edema in her legs for a number of years. She had a similar presentation to currently in the right lateral lower leg roughly a year ago that she was able to heal on her own. As noted she has systemic lupus but does not have lupus nephritis according to the patient. She has a lot of edema in her lower legs. The ABI's could not be obtained. 12/31/15; the patient's insurance which is [place] makes her out of network for any home health therefore we have been changing her dressing in our facility. I reviewed her trip to the ER on 12/21/2015 her creatinine was within the normal range hemoglobin and white count normal. Culture of the wound was negative. X-ray of the leg showed soft tissue edema no foreign bodies. We have been dressing this with Aquacel Ag due to the amount of ongoing drainage 01/07/16; the patient had her arterial studies this morning I don't have these results area she comes back in to have [place] rewrap since she doesn't have access to home health 01/14/16 I still do not have the results of her arterial studies. Our nurse correctly pointed out that we've been wrapping the left leg without an open wound I think this had to do with the fact that she had so much edema when she came in I was concerned about leaving this unwrapped. 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 We left her left leg unwrapped last week as she has no open wound here today she has extensive edema here. We did order stockings however I believe the company called but they have not called them back. She comes  back in today with extensive edema in the left leg. We have her arterial studies which showed triphasic waves and all locations tested including her posterior tibial and anterior tibial  arteries bilaterally. Report listed no hemodynamically significant bilateral lower extremity arterial stenosis. She has her venous studies later this month. I'm going to rewrap the left leg due to the extent of the edema, transitioning her into the stocking we ordered last week as soon as one is available 01/27/16 the patient arrived today with pooled serosanguineous drainage over the wound it would not absorb into her Aquacel Ag which is somewhat on. In spite of this the dimensions of her wound are down [name], [name] ([id number]) considerably and the wound bed looks healthy without any need for debridement. She is obtained her juxtalite stockings the left leg which has no open wound. We are waiting for the right leg wound to get smaller before ordering her one for the right leg 02/03/16; patient wound looked improved. No debridement was required. We've been using the juxtalite stocking on the left leg which is no open wound. 02/11/16 wound is remarkably better. No debridement was required. Healthy rim of epithelialization. We have been using Aquacel Ag, we'll try to close this down with RTD over the next 2 weeks 02/18/16 wound continues to improve down a centimeter in length and width. No debridement was required. Advancing epithelialization. We started RTD last week 02/25/16: pt brought juxtalite for right lower leg today. nurse reports RTD was dry and adhered to the wound. required removal by NS. wound bed with 100% healthy red granulation tissue. denies systemic s/s of infection. 03/02/16; Considerable improvement. no debridement. 03/09/16; wound bed is very vascular no major change in dimensions. Continuing with RTD 03/16/16; unfortunately no major change in dimensions however the wound bed looks very healthy. I will continue with RTD for one more week. If his stalls consider change to collagen. 03/30/2016 -- she's been using juxta lites on her left lower extremity but her lymphedema is  very significantly increased and she is not tolerating the juxta lites the edema has increased symptoms significantly 04/06/16; apparently the RTD started sticking to the wound therefore she was changed to sorbact. Her 4-layer wrap appears to have slipped one third of the way down her leg. She was tolerating the juxtalite on the left leg, I don't see the need to wrap the left leg. She is not a candidate for external compression pumps secondary to insurance issues. 04/13/16; wound is actually measuring larger. Has been using Sorbact 04/20/16; although the wound dimensions of really not changed all that much, the better this certainly looks healthy and there is advancing epithelialization from both sides. No evidence of infection Electronic Signature(s) Signed: 04/21/2016 7:55:14 AM By: [name] Entered By: Baltazar Najjar on 04/20/2016 15:04:21 Hannum, [name] ([id number]) -------------------------------------------------------------------------------- Physical Exam Details [name] Date of Service: 04/20/2016 2:00 PM Patient Name: [name] Account Number: [id number] Medical Record Treating RN: Clover Mealy RN, [name], [place] Sink [id number] Number: Other Clinician: April 12, 1956 (60 y.o. Treating ROBSON, MICHAEL Date of Birth/Sex: Female) Physician/Extender: G Primary Care [name], [facility] Physician: Referring Physician: Sherrie Mustache Weeks in Treatment: 17 Constitutional Sitting or standing Blood Pressure is within target range for patient.. Pulse regular and within target range for patient.Marland Kitchen Respirations regular, non-labored and within target range.. Temperature is normal and within the target range for the patient.. Patient's appearance is neat and clean. Appears in no acute distress. Well nourished and well developed.. Eyes Conjunctivae clear. No discharge.Marland Kitchen  Respiratory Respiratory effort is easy and symmetric bilaterally. Rate is normal at rest and on room air.. Bilateral  breath sounds are clear and equal in all lobes with no wheezes, rales or rhonchi.. Cardiovascular . Pedal pulses absent bilaterally.. Edema present in both extremities. Edema is well controlled this week. Severe bilateral venous insufficiency with venous inflammation. Lymphatic Nonpalpable in the popliteal or inguinal area. Integumentary (Hair, Skin) Other than the severe chronic venous insufficiency there are no other issues.Marland Kitchen. Psychiatric No evidence of depression, anxiety, or agitation. Calm, cooperative, and communicative. Appropriate interactions and affect.. Notes Wound exam; mixture of chronic venous insufficiency changes with lymphedema. Her wound bed actually looks quite stable to improved although are dimensions as measured in the clinic don't really seem to of reflected that. No debridement is required Electronic Signature(s) Signed: 04/21/2016 7:55:14 AM By: Baltazar Najjarobson, Michael MD Entered By: Baltazar Najjarobson, Michael on 04/20/2016 15:07:01 Moeller, [name] ([id number]) -------------------------------------------------------------------------------- Physician Orders Details [name] MessRAWFORD, Shareese Date of Service: 04/20/2016 2:00 PM Patient Name: [name] Account Number: [id number] Medical Record Treating RN: Clover Mealyfful, RN, [name], [place] SinkRita [id number] Number: Other Clinician: 01/17/1956 (60 y.o. Treating ROBSON, MICHAEL Date of Birth/Sex: Female) Physician/Extender: G Primary Care [name] Physician: Referring Physician: Augustin CoupeJADALI, FAYEGH Weeks in Treatment: 3817 Verbal / Phone Orders: Yes Clinician: Afful, RN, [name], Rita Read Back and Verified: Yes Diagnosis Coding Wound Cleansing Wound #1 Right,Medial Lower Leg o Cleanse wound with mild soap and water Skin Barriers/Peri-Wound Care Wound #1 Right,Medial Lower Leg o Moisturizing lotion Primary Wound Dressing Wound #1 Right,Medial Lower Leg o Hydrafera Blue Secondary Dressing Wound #1 Right,Medial Lower Leg o ABD pad Dressing  Change Frequency Wound #1 Right,Medial Lower Leg o Change dressing every week Follow-up Appointments Wound #1 Right,Medial Lower Leg o Return Appointment in 1 week. Edema Control Wound #1 Right,Medial Lower Leg o 4-Layer Compression System - Right Lower Extremity - unna to anchor Additional Orders / Instructions Wound #1 Right,Medial Lower Leg o Increase protein intake. Liane ComberCRAWFORD, Elienai L. ([id number]) Electronic Signature(s) Signed: 04/20/2016 4:23:09 PM By: Elpidio EricAfful, Rita [name], RN Signed: 04/21/2016 7:55:14 AM By: Baltazar Najjarobson, Michael MD Entered By: Elpidio EricAfful, Rita on 04/20/2016 14:55:01 Pepperman, [name] ([id number]) -------------------------------------------------------------------------------- Problem List Details [name] MessRAWFORD, Jacarra Date of Service: 04/20/2016 2:00 PM Patient Name: [name] Account Number: [id number] Medical Record Treating RN: Huel CoventryWoody, Kim [id number] Number: Other Clinician: 06/24/1956 (60 y.o. Treating ROBSON, MICHAEL Date of Birth/Sex: Female) Physician/Extender: G Primary Care [name], [facility] Physician: Referring Physician: Sherrie MustacheJADALI, FAYEGH Weeks in Treatment: 17 Active Problems ICD-10 Encounter Code Description Active Date Diagnosis I87.331 Chronic venous hypertension (idiopathic) with ulcer and 12/23/2015 Yes inflammation of right lower extremity I87.322 Chronic venous hypertension (idiopathic) with 12/23/2015 Yes inflammation of left lower extremity M32.10 Systemic lupus erythematosus, organ or system 12/23/2015 Yes involvement unspecified 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 ([id number]) -------------------------------------------------------------------------------- Progress Note Details Hazell, Peytin Date of Service:  04/20/2016 2:00 PM Patient Name: [name] Account Number: [id number] Medical Record Treating RN: Clover Mealyfful, RN, [name], [place] SinkRita [id number] Number: Other Clinician: 07/04/1956 (60 y.o. Treating ROBSON, MICHAEL Date of Birth/Sex: Female) Physician/Extender: G Primary Care [name] Physician: Referring Physician: Sherrie MustacheJADALI, FAYEGH Weeks in Treatment: 17 Subjective Chief Complaint Information obtained from Patient Patient is here for review of a fairly substantial wound on her right medial lower leg that is been present for the last week History  of Present Illness (HPI) 12/23/15; this is a 60 year old lady who recently relocated back to [place] from [place]. She is staying with a family friend previously lived with a daughter in [place]. 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 She is on chronic prednisone she is not a diabetic. Dose of prednisone is 10 mg. The patient tells me that roughly a week ago she developed a fairly substantial blister over this area that then ruptured. She was seen in the emergency room on 5/7 an x-ray of the leg was negative she was put on Septra although I don't think any cultures were done. She also tells me she has had chronic edema in her legs for a number of years. She had a similar presentation to currently in the right lateral lower leg roughly a year ago that she was able to heal on her own. As noted she has systemic lupus but does not have lupus nephritis according to the patient. She has a lot of edema in her lower legs. The ABI's could not be obtained. 12/31/15; the patient's insurance which is [place] makes her out of network for any home health therefore we have been changing her dressing in our facility. I reviewed her trip to the ER on 12/21/2015 her creatinine was within the normal range hemoglobin and white count normal. Culture of the wound was negative. X-ray of the leg showed  soft tissue edema no foreign bodies. We have been dressing this with Aquacel Ag due to the amount of ongoing drainage 01/07/16; the patient had her arterial studies this morning I don't have these results area she comes back in to have [place] rewrap since she doesn't have access to home health 01/14/16 I still do not have the results of her arterial studies. Our nurse correctly pointed out that we've been wrapping the left leg without an open wound I think this had to do with the fact that she had so much edema when she came in I was concerned about leaving this unwrapped. 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 We left her left leg unwrapped last week as she has no open wound here today she has extensive edema here. We did order stockings however I believe the company called but they have not called them Hawkes, Shaylie L. ([id number]) back. She comes back in today with extensive edema in the left leg. We have her arterial studies which showed triphasic waves and all locations tested including her posterior tibial and anterior tibial arteries bilaterally. Report listed no hemodynamically significant bilateral lower extremity arterial stenosis. She has her venous studies later this month. I'm going to rewrap the left leg due to the extent of the edema, transitioning her into the stocking we ordered last week as soon as one is available 01/27/16 the patient arrived today with pooled serosanguineous drainage over the wound it would not absorb into her Aquacel Ag which is somewhat on. In spite of this the dimensions of her wound are down considerably and the wound bed looks healthy without any need for debridement. She is obtained her juxtalite stockings the left leg which has no open wound. We are waiting for the right leg wound to get smaller before ordering her one for the  right leg 02/03/16; patient wound looked improved. No debridement was  required. We've been using the juxtalite stocking on the left leg which is no open wound. 02/11/16 wound is remarkably better. No debridement was required. Healthy rim of epithelialization. We have been using Aquacel Ag, we'll try to close this down with RTD over the next 2 weeks 02/18/16 wound continues to improve down a centimeter in length and width. No debridement was required. Advancing epithelialization. We started RTD last week 02/25/16: pt brought juxtalite for right lower leg today. nurse reports RTD was dry and adhered to the wound. required removal by NS. wound bed with 100% healthy red granulation tissue. denies systemic s/s of infection. 03/02/16; Considerable improvement. no debridement. 03/09/16; wound bed is very vascular no major change in dimensions. Continuing with RTD 03/16/16; unfortunately no major change in dimensions however the wound bed looks very healthy. I will continue with RTD for one more week. If his stalls consider change to collagen. 03/30/2016 -- she's been using juxta lites on her left lower extremity but her lymphedema is very significantly increased and she is not tolerating the juxta lites the edema has increased symptoms significantly 04/06/16; apparently the RTD started sticking to the wound therefore she was changed to sorbact. Her 4-layer wrap appears to have slipped one third of the way down her leg. She was tolerating the juxtalite on the left leg, I don't see the need to wrap the left leg. She is not a candidate for external compression pumps secondary to insurance issues. 04/13/16; wound is actually measuring larger. Has been using Sorbact 04/20/16; although the wound dimensions of really not changed all that much, the better this certainly looks healthy and there is advancing epithelialization from both sides. No evidence of infection Objective Constitutional Sitting or standing Blood  Pressure is within target range for patient.. Pulse regular and within target range for patient.Marland Kitchen Respirations regular, non-labored and within target range.. Temperature is normal and within the target range for the patient.. Patient's appearance is neat and clean. Appears in no acute distress. Well nourished and well developed.. Vitals Time Taken: 2:30 PM, Height: 62 in, Weight: 170 lbs, BMI: 31.1, Temperature: 98.1 F, Pulse: 84 bpm, Respiratory Rate: 18 breaths/min, Blood Pressure: 134/72 mmHg. FLORESTINE, CARMICAL ([id number]) Eyes Conjunctivae clear. No discharge.Marland Kitchen Respiratory Respiratory effort is easy and symmetric bilaterally. Rate is normal at rest and on room air.. Bilateral breath sounds are clear and equal in all lobes with no wheezes, rales or rhonchi.. Cardiovascular Pedal pulses absent bilaterally.. Edema present in both extremities. Edema is well controlled this week. Severe bilateral venous insufficiency with venous inflammation. Lymphatic Nonpalpable in the popliteal or inguinal area. Psychiatric No evidence of depression, anxiety, or agitation. Calm, cooperative, and communicative. Appropriate interactions and affect.. General Notes: Wound exam; mixture of chronic venous insufficiency changes with lymphedema. Her wound bed actually looks quite stable to improved although are dimensions as measured in the clinic don't really seem to of reflected that. No debridement is required Integumentary (Hair, Skin) Other than the severe chronic venous insufficiency there are no other issues.. Wound #1 status is Open. Original cause of wound was Blister. The wound is located on the Right,Medial Lower Leg. The wound measures 2.5cm length x 0.7cm width x 0.1cm depth; 1.374cm^2 area and 0.137cm^3 volume. The wound is limited to skin breakdown. There is no tunneling or undermining noted. There is a large amount of serosanguineous drainage noted. The wound margin is flat and intact. There  is large (67-100%) red, friable granulation within the wound bed. There is no necrotic  tissue within the wound bed. The periwound skin appearance exhibited: Friable, Moist, Hemosiderin Staining. The periwound skin appearance did not exhibit: Callus, Crepitus, Excoriation, Fluctuance, Induration, Localized Edema, Rash, Scarring, Dry/Scaly, Maceration, Atrophie Blanche, Cyanosis, Ecchymosis, Mottled, Pallor, Rubor, Erythema. Periwound temperature was noted as No Abnormality. The periwound has tenderness on palpation. Assessment Active Problems ICD-10 I87.331 - 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 lower extremity I87.322 - Chronic venous hypertension (idiopathic) with inflammation of left lower extremity M32.10 - Systemic lupus erythematosus, organ or system involvement unspecified Camire, Shelbi L. ([id number]) L97.211 - Non-pressure chronic ulcer of right calf limited to breakdown of skin Plan Wound Cleansing: Wound #1 Right,Medial Lower Leg: Cleanse wound with mild soap and water Skin Barriers/Peri-Wound Care: Wound #1 Right,Medial Lower Leg: Moisturizing lotion Primary Wound Dressing: Wound #1 Right,Medial Lower Leg: Hydrafera Blue Secondary Dressing: Wound #1 Right,Medial Lower Leg: ABD pad Dressing Change Frequency: Wound #1 Right,Medial Lower Leg: Change dressing every week Follow-up Appointments: Wound #1 Right,Medial Lower Leg: Return Appointment in 1 week. Edema Control: Wound #1 Right,Medial Lower Leg: 4-Layer Compression System - Right Lower Extremity - unna to anchor Additional Orders / Instructions: Wound #1 Right,Medial Lower Leg: Increase protein intake. #1 I changed her to [facility] last week this really seems to of helped. No debridement is required and there is no evidence of infection. #2 I am hopeful that this is progressing towards healing #3 she tolerates a Profore wrap well and I think this is helped  her. Electronic Signature(s) Signed: 04/21/2016 7:55:14 AM By: [name] [place], [name] ([id number]) Entered By: Baltazar Najjar on 04/20/2016 15:07:44 Lambertson, [name] ([id number]) -------------------------------------------------------------------------------- SuperBill Details [name] Date of Service: 04/20/2016 Patient Name: [name] Account Number: [id number] Medical Record Treating RN: Clover Mealy RN, [name], [place] Sink [id number] Number: Other Clinician: 03-28-1956 (60 y.o. Treating ROBSON, MICHAEL Date of Birth/Sex: Female) Physician/Extender: G Primary Care Weeks in Treatment: 59 [name], FAYEGH Physician: Referring Physician: Sherrie Mustache Diagnosis Coding ICD-10 Codes Code Description 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 lower I87.331 extremity I87.322 Chronic venous hypertension (idiopathic) with inflammation of left lower extremity M32.10 Systemic lupus erythematosus, organ or system involvement unspecified L97.211 Non-pressure chronic ulcer of right calf limited to breakdown of skin Facility Procedures CPT4: Description Modifier Quantity Code [id number] (Facility Use Only) [phone] - APPLY MULTLAY COMPRS LWR RT 1 LEG Physician Procedures CPT4: Description Modifier Quantity Code [id number] - WC PHYS LEVEL 3 - EST PT 1 ICD-10 Description Diagnosis I87.331 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 lower extremity Electronic Signature(s) Signed: 04/21/2016 7:55:14 AM By: [name] Entered By: Baltazar Najjar on 04/20/2016 15:08:48

## 2016-04-27 ENCOUNTER — Encounter: Payer: Medicare Other | Admitting: Internal Medicine

## 2016-04-27 DIAGNOSIS — I87331 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 lower extremity: Secondary | ICD-10-CM | POA: Diagnosis not present

## 2016-04-27 NOTE — Progress Notes (Signed)
[name], [name] ([id number]) Visit Report for 04/27/2016 Chief Complaint Document Details [name] Date of Service: 04/27/2016 12:45 PM Patient Name: [name] Account Number: [id number] Medical Record Treating RN: [name] [id number] Number: Other Clinician: September 14, 1955 (60 y.o. Treating [name] Date of Birth/Sex: Female) Physician/Extender: G Primary Care [name] Physician: Referring Physician: Sherrie Mustache Weeks in Treatment: 76 Information Obtained from: Patient Chief Complaint Patient is here for review of a fairly substantial wound on her right medial lower leg that is been present for the last week Electronic Signature(s) Signed: 04/27/2016 4:19:04 PM By: [name] Entered By: Baltazar Najjar on 04/27/2016 13:31:44 [name] ([id number]) -------------------------------------------------------------------------------- HPI Details [name] Date of Service: 04/27/2016 12:45 PM Patient Name: [name] Account Number: [id number] Medical Record Treating RN: [name] [id number] Number: Other Clinician: 1955-10-27 (60 y.o. Treating Ailee Pates Date of Birth/Sex: Female) Physician/Extender: G Primary Care [name], [facility] Physician: Referring Physician: Sherrie Mustache Weeks in Treatment: 18 History of Present Illness HPI Description: 12/23/15; this is a 60 year old lady who recently relocated back to [place] from [place]. She is staying with a family friend previously lived with a daughter in [place]. 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 She is on chronic prednisone she is not a diabetic. Dose of prednisone is 10 mg. The patient tells me that roughly a week ago she developed a fairly substantial blister over this area that then ruptured. She was seen in the emergency room on 5/7 an x-ray of the leg was negative she was put on Septra  although I don't think any cultures were done. She also tells me she has had chronic edema in her legs for a number of years. She had a similar presentation to currently in the right lateral lower leg roughly a year ago that she was able to heal on her own. As noted she has systemic lupus but does not have lupus nephritis according to the patient. She has a lot of edema in her lower legs. The ABI's could not be obtained. 12/31/15; the patient's insurance which is [place] makes her out of network for any home health therefore we have been changing her dressing in our facility. I reviewed her trip to the ER on 12/21/2015 her creatinine was within the normal range hemoglobin and white count normal. Culture of the wound was negative. X-ray of the leg showed soft tissue edema no foreign bodies. We have been dressing this with Aquacel Ag due to the amount of ongoing drainage 01/07/16; the patient had her arterial studies this morning I don't have these results area she comes back in to have [place] rewrap since she doesn't have access to home health 01/14/16 I still do not have the results of her arterial studies. Our nurse correctly pointed out that we've been wrapping the left leg without an open wound I think this had to do with the fact that she had so much edema when she came in I was concerned about leaving this unwrapped. 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 We left her left leg unwrapped last week as she has no open wound here today she has extensive edema here. We did order stockings however I believe the company called but they have not called them back. She comes back in today with  extensive edema in the left leg. We have her arterial studies which showed triphasic waves and all locations tested including her posterior tibial and anterior tibial  arteries bilaterally. Report listed no hemodynamically significant bilateral lower extremity arterial stenosis. She has her venous studies later this month. I'm going to rewrap the left leg due to the extent of the edema, transitioning her into the stocking we ordered last week as soon as one is available 01/27/16 the patient arrived today with pooled serosanguineous drainage over the wound it would not absorb into her Aquacel Ag which is somewhat on. In spite of this the dimensions of her wound are down Purdum, [name] ([id number]) considerably and the wound bed looks healthy without any need for debridement. She is obtained her juxtalite stockings the left leg which has no open wound. We are waiting for the right leg wound to get smaller before ordering her one for the right leg 02/03/16; patient wound looked improved. No debridement was required. We've been using the juxtalite stocking on the left leg which is no open wound. 02/11/16 wound is remarkably better. No debridement was required. Healthy rim of epithelialization. We have been using Aquacel Ag, we'll try to close this down with RTD over the next 2 weeks 02/18/16 wound continues to improve down a centimeter in length and width. No debridement was required. Advancing epithelialization. We started RTD last week 02/25/16: pt brought juxtalite for right lower leg today. nurse reports RTD was dry and adhered to the wound. required removal by NS. wound bed with 100% healthy red granulation tissue. denies systemic s/s of infection. 03/02/16; Considerable improvement. no debridement. 03/09/16; wound bed is very vascular no major change in dimensions. Continuing with RTD 03/16/16; unfortunately no major change in dimensions however the wound bed looks very healthy. I will continue with RTD for one more week. If his stalls consider change to collagen. 03/30/2016 -- she's been using juxta lites on her left lower extremity but her lymphedema is  very significantly increased and she is not tolerating the juxta lites the edema has increased symptoms significantly 04/06/16; apparently the RTD started sticking to the wound therefore she was changed to sorbact. Her 4-layer wrap appears to have slipped one third of the way down her leg. She was tolerating the juxtalite on the left leg, I don't see the need to wrap the left leg. She is not a candidate for external compression pumps secondary to insurance issues. 04/13/16; wound is actually measuring larger. Has been using Sorbact 04/20/16; although the wound dimensions of really not changed all that much, the better this certainly looks healthy and there is advancing epithelialization from both sides. No evidence of infection 04/27/16 changed to [facility] last week and the dimensions appear to be a lot better. No evidence of infection. She does not put the juxtalite stocking on the left leg on correctly nevertheless she has never had an open wound here. I am really uncertain whether we ever got 2 to juxtalite stockings for her. The wound started as a blister Electronic Signature(s) Signed: 04/27/2016 4:19:04 PM By: [name] Entered By: Baltazar Najjar on 04/27/2016 13:33:15 [name], [name] ([id number]) -------------------------------------------------------------------------------- Physical Exam Details [name] Date of Service: 04/27/2016 12:45 PM Patient Name: [name] Account Number: [id number] Medical Record Treating RN: [name] [id number] Number: Other Clinician: 05-16-1956 (60 y.o. Treating Rogue Rafalski Date of Birth/Sex: Female) Physician/Extender: G Primary Care [name], [facility] Physician: Referring Physician: Sherrie Mustache Weeks in Treatment: 18 Constitutional Sitting or  standing Blood Pressure is within target range for patient.. Pulse regular and within target range for patient.Marland Kitchen. Respirations regular, non-labored and within target range..  Temperature is normal and within the target range for the patient.. Patient's appearance is neat and clean. Appears in no acute distress. Well nourished and well developed.. Notes Wound exam; mixture of chronic venous insufficiency changes with some degree of lymphedema. Her wound bed looks quite stable. No debridement was required. No evidence of infection. Dimensions of the wound have improved. Electronic Signature(s) Signed: 04/27/2016 4:19:04 PM By: Baltazar Najjarobson, Maison Agrusa MD Entered By: Baltazar Najjarobson, Yazen Rosko on 04/27/2016 13:34:36 [name], [name] MallardGLORIA L. ([id number]) -------------------------------------------------------------------------------- Physician Orders Details [name] MessRAWFORD, Bekah Date of Service: 04/27/2016 12:45 PM Patient Name: [name] Account Number: [id number] Medical Record Treating RN: [name] SitesDorthy, Joanna [id number] Number: Other Clinician: 04/06/1956 (60 y.o. Treating Jenisa Monty Date of Birth/Sex: Female) Physician/Extender: G Primary [name] Physician: Referring Physician: Sherrie MustacheJADALI, FAYEGH Weeks in Treatment: 5018 Verbal / Phone Orders: Yes Clinician: Curtis Sitesorthy, Joanna Read Back and Verified: Yes Diagnosis Coding Wound Cleansing Wound #1 Right,Medial Lower Leg o Cleanse wound with mild soap and water Skin Barriers/Peri-Wound Care Wound #1 Right,Medial Lower Leg o Moisturizing lotion Primary Wound Dressing Wound #1 Right,Medial Lower Leg o Hydrafera Blue Secondary Dressing Wound #1 Right,Medial Lower Leg o ABD pad Dressing Change Frequency Wound #1 Right,Medial Lower Leg o Change dressing every week Follow-up Appointments Wound #1 Right,Medial Lower Leg o Return Appointment in 1 week. Edema Control Wound #1 Right,Medial Lower Leg o 4-Layer Compression System - Right Lower Extremity - unna to anchor Additional Orders / Instructions Wound #1 Right,Medial Lower Leg o Increase protein intake. Liane ComberCRAWFORD, Esmee L. ([id number]) Electronic  Signature(s) Signed: 04/27/2016 4:19:04 PM By: Baltazar Najjarobson, Gwendolen Hewlett MD Signed: 04/27/2016 5:07:29 PM By: [name] Sitesorthy, Joanna Entered By: [name] Sitesorthy, Joanna on 04/27/2016 13:31:02 Whitby, [name] MallardGLORIA L. ([id number]) -------------------------------------------------------------------------------- Problem List Details [name] MessRAWFORD, Kristain Date of Service: 04/27/2016 12:45 PM Patient Name: [name] Account Number: [id number] Medical Record Treating RN: [name] SitesDorthy, Joanna [id number] Number: Other Clinician: 03/13/1956 (60 y.o. Treating Analycia Khokhar Date of Birth/Sex: Female) Physician/Extender: G Primary Care [name] Physician: Referring Physician: Sherrie MustacheJADALI, FAYEGH Weeks in Treatment: 5318 Active Problems ICD-10 Encounter Code Description Active Date Diagnosis I87.331 Chronic venous hypertension (idiopathic) with ulcer and 12/23/2015 Yes inflammation of right lower extremity I87.322 Chronic venous hypertension (idiopathic) with 12/23/2015 Yes inflammation of left lower extremity M32.10 Systemic lupus erythematosus, organ or system 12/23/2015 Yes involvement unspecified L97.211 Non-pressure chronic ulcer of right calf limited to 03/02/2016 Yes breakdown of skin Inactive Problems Resolved Problems Electronic Signature(s) Signed: 04/27/2016 4:19:04 PM By: Baltazar Najjarobson, Jeananne Bedwell MD Entered By: Baltazar Najjarobson, Gerline Ratto on 04/27/2016 13:30:41 Goupil, [name] MallardGLORIA L. ([id number]) -------------------------------------------------------------------------------- Progress Note Details [name] MessRAWFORD, Elenora Date of Service: 04/27/2016 12:45 PM Patient Name: [name] Account Number: [id number] Medical Record Treating RN: [name] SitesDorthy, Joanna [id number] Number: Other Clinician: 07/25/1956 (60 y.o. Treating Candis Kabel Date of Birth/Sex: Female) Physician/Extender: G Primary Care [name] Physician: Referring Physician: Sherrie MustacheJADALI, FAYEGH Weeks in Treatment: 18 Subjective Chief Complaint Information obtained from Patient Patient is here  for review of a fairly substantial wound on her right medial lower leg that is been present for the last week History of Present Illness (HPI) 12/23/15; this is a 60 year old lady who recently relocated back to [place] from [place]. She is staying with a family friend previously lived with a daughter in [place]. 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 She is on chronic prednisone she  is not a diabetic. Dose of prednisone is 10 mg. The patient tells me that roughly a week ago she developed a fairly substantial blister over this area that then ruptured. She was seen in the emergency room on 5/7 an x-ray of the leg was negative she was put on Septra although I don't think any cultures were done. She also tells me she has had chronic edema in her legs for a number of years. She had a similar presentation to currently in the right lateral lower leg roughly a year ago that she was able to heal on her own. As noted she has systemic lupus but does not have lupus nephritis according to the patient. She has a lot of edema in her lower legs. The ABI's could not be obtained. 12/31/15; the patient's insurance which is [place] makes her out of network for any home health therefore we have been changing her dressing in our facility. I reviewed her trip to the ER on 12/21/2015 her creatinine was within the normal range hemoglobin and white count normal. Culture of the wound was negative. X-ray of the leg showed soft tissue edema no foreign bodies. We have been dressing this with Aquacel Ag due to the amount of ongoing drainage 01/07/16; the patient had her arterial studies this morning I don't have these results area she comes back in to have [place] rewrap since she doesn't have access to home health 01/14/16 I still do not have the results of her arterial studies. Our nurse correctly pointed out that we've been wrapping the left leg without an  open wound I think this had to do with the fact that she had so much edema when she came in I was concerned about leaving this unwrapped. 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 We left her left leg unwrapped last week as she has no open wound here today she has extensive edema here. We did order stockings however I believe the company called but they have not called them Harty, Miyani L. ([id number]) back. She comes back in today with extensive edema in the left leg. We have her arterial studies which showed triphasic waves and all locations tested including her posterior tibial and anterior tibial arteries bilaterally. Report listed no hemodynamically significant bilateral lower extremity arterial stenosis. She has her venous studies later this month. I'm going to rewrap the left leg due to the extent of the edema, transitioning her into the stocking we ordered last week as soon as one is available 01/27/16 the patient arrived today with pooled serosanguineous drainage over the wound it would not absorb into her Aquacel Ag which is somewhat on. In spite of this the dimensions of her wound are down considerably and the wound bed looks healthy without any need for debridement. She is obtained her juxtalite stockings the left leg which has no open wound. We are waiting for the right leg wound to get smaller before ordering her one for the right leg 02/03/16; patient wound looked improved. No debridement was required. We've been using the juxtalite stocking on the left leg which is no open wound. 02/11/16 wound is remarkably better. No debridement was required. Healthy rim of epithelialization. We have been using Aquacel Ag, we'll try to close this down with RTD over the next 2 weeks 02/18/16 wound continues to improve down a centimeter in length and  width. No debridement  was required. Advancing epithelialization. We started RTD last week 02/25/16: pt brought juxtalite for right lower leg today. nurse reports RTD was dry and adhered to the wound. required removal by NS. wound bed with 100% healthy red granulation tissue. denies systemic s/s of infection. 03/02/16; Considerable improvement. no debridement. 03/09/16; wound bed is very vascular no major change in dimensions. Continuing with RTD 03/16/16; unfortunately no major change in dimensions however the wound bed looks very healthy. I will continue with RTD for one more week. If his stalls consider change to collagen. 03/30/2016 -- she's been using juxta lites on her left lower extremity but her lymphedema is very significantly increased and she is not tolerating the juxta lites the edema has increased symptoms significantly 04/06/16; apparently the RTD started sticking to the wound therefore she was changed to sorbact. Her 4-layer wrap appears to have slipped one third of the way down her leg. She was tolerating the juxtalite on the left leg, I don't see the need to wrap the left leg. She is not a candidate for external compression pumps secondary to insurance issues. 04/13/16; wound is actually measuring larger. Has been using Sorbact 04/20/16; although the wound dimensions of really not changed all that much, the better this certainly looks healthy and there is advancing epithelialization from both sides. No evidence of infection 04/27/16 changed to [facility] last week and the dimensions appear to be a lot better. No evidence of infection. She does not put the juxtalite stocking on the left leg on correctly nevertheless she has never had an open wound here. I am really uncertain whether we ever got 2 to juxtalite stockings for her. The wound started as a blister Objective Constitutional Sitting or standing Blood Pressure is within target range for patient.. Pulse regular and within target range for  patient.Marland Kitchen Respirations regular, non-labored and within target range.. Temperature is normal and within the target range for the patient.. Patient's appearance is neat and clean. Appears in no acute distress. Well Stalker, Alanna L. ([id number]) nourished and well developed.. Vitals Time Taken: 12:59 PM, Height: 62 in, Weight: 170 lbs, BMI: 31.1, Temperature: 98.3 F, Pulse: 101 bpm, Respiratory Rate: 18 breaths/min, Blood Pressure: 139/85 mmHg. General Notes: Wound exam; mixture of chronic venous insufficiency changes with some degree of lymphedema. Her wound bed looks quite stable. No debridement was required. No evidence of infection. Dimensions of the wound have improved. Integumentary (Hair, Skin) Wound #1 status is Open. Original cause of wound was Blister. The wound is located on the Right,Medial Lower Leg. The wound measures 1.2cm length x 0.6cm width x 0.1cm depth; 0.565cm^2 area and 0.057cm^3 volume. The wound is limited to skin breakdown. There is no tunneling or undermining noted. There is a large amount of serosanguineous drainage noted. The wound margin is flat and intact. There is large (67-100%) red, friable granulation within the wound bed. There is no necrotic tissue within the wound bed. The periwound skin appearance exhibited: Friable, Moist, Hemosiderin Staining. The periwound skin appearance did not exhibit: Callus, Crepitus, Excoriation, Fluctuance, Induration, Localized Edema, Rash, Scarring, Dry/Scaly, Maceration, Atrophie Blanche, Cyanosis, Ecchymosis, Mottled, Pallor, Rubor, Erythema. Periwound temperature was noted as No Abnormality. The periwound has tenderness on palpation. Assessment Active Problems ICD-10 I87.331 - 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 lower extremity I87.322 - Chronic venous hypertension (idiopathic) with inflammation of left lower extremity M32.10 - Systemic lupus erythematosus, organ or system  involvement unspecified L97.211 - Non-pressure chronic ulcer  of right calf limited to breakdown of skin Plan Wound Cleansing: Wound #1 Right,Medial Lower Leg: Cleanse wound with mild soap and water Skin Barriers/Peri-Wound Care: Wound #1 Right,Medial Lower Leg: Moisturizing lotion Primary Wound Dressing: THURLEY, FRANCESCONI. ([id number]) Wound #1 Right,Medial Lower Leg: Hydrafera Blue Secondary Dressing: Wound #1 Right,Medial Lower Leg: ABD pad Dressing Change Frequency: Wound #1 Right,Medial Lower Leg: Change dressing every week Follow-up Appointments: Wound #1 Right,Medial Lower Leg: Return Appointment in 1 week. Edema Control: Wound #1 Right,Medial Lower Leg: 4-Layer Compression System - Right Lower Extremity - unna to anchor Additional Orders / Instructions: Wound #1 Right,Medial Lower Leg: Increase protein intake. We will continue with Hydrofera Blue under a Profore wrap. Wound appears to be making progress towards closure. We believe that there is a second juxtalite stockings Electronic Signature(s) Signed: 04/27/2016 4:19:04 PM By: [name] Entered By: Baltazar Najjar on 04/27/2016 13:35:12 Ferrando, [name] ([id number]) -------------------------------------------------------------------------------- SuperBill Details Wooden, Laelle Date of Service: 04/27/2016 Patient Name: [name] Account Number: [id number] Medical Record Treating RN: [name] [id number] Number: Other Clinician: May 07, 1956 (60 y.o. Treating Chong January Date of Birth/Sex: Female) Physician/Extender: G Primary Care Weeks in Treatment: 3 [name], FAYEGH Physician: Referring Physician: Sherrie Mustache Diagnosis Coding ICD-10 Codes Code Description 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 lower I87.331 extremity I87.322 Chronic venous hypertension (idiopathic) with inflammation of left lower extremity M32.10 Systemic lupus  erythematosus, organ or system involvement unspecified L97.211 Non-pressure chronic ulcer of right calf limited to breakdown of skin Facility Procedures CPT4: Description Modifier Quantity Code [id number] (Facility Use Only) [phone] - APPLY MULTLAY COMPRS LWR RT 1 LEG Physician Procedures CPT4: Description Modifier Quantity Code [id number] 10272 - WC PHYS LEVEL 2 - EST PT 1 ICD-10 Description Diagnosis I87.331 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 lower extremity Electronic Signature(s) Signed: 04/27/2016 4:19:04 PM By: [name] Entered By: Baltazar Najjar on 04/27/2016 13:35:48

## 2016-04-27 NOTE — Progress Notes (Signed)
[name] ([id number]) Visit Report for 04/27/2016 Arrival Information Details Patient Name: [name], [name] Date of Service: 04/27/2016 12:45 PM Medical Record Patient Account Number: [id number] Number: Treating RN: Curtis Sites 1956-06-09 (60 y.o. Other Clinician: Date of Birth/Sex: Female) Treating ROBSON, MICHAEL Primary Care Physician: Sherrie Mustache Physician/Extender: G Referring Physician: Sherrie Mustache Weeks in Treatment: 18 Visit Information History Since Last Visit Added or deleted any medications: No Patient Arrived: Ambulatory Any new allergies or adverse reactions: No Arrival Time: 12:55 Had a fall or experienced change in No Accompanied By: self activities of daily living that may affect Transfer Assistance: None risk of falls: Patient Identification Verified: Yes Signs or symptoms of abuse/neglect since last No Secondary Verification Process Yes visito Completed: Hospitalized since last visit: No Patient Requires Transmission-Based No Pain Present Now: No Precautions: Patient Has Alerts: No Electronic Signature(s) Signed: 04/27/2016 5:07:29 PM By: Curtis Sites Entered By: Curtis Sites on 04/27/2016 12:56:52 [name] ([id number]) -------------------------------------------------------------------------------- Encounter Discharge Information Details Patient Name: Collings, Elorah L. Date of Service: 04/27/2016 12:45 PM Medical Record Patient Account Number: [id number] Number: Treating RN: Curtis Sites [birth year] (60 y.o. Other Clinician: Date of Birth/Sex: Female) Treating ROBSON, MICHAEL Primary Care Physician: Sherrie Mustache Physician/Extender: G Referring Physician: Sherrie Mustache Weeks in Treatment: 2 Encounter Discharge Information Items Discharge Pain Level: 0 Discharge Condition: Stable Ambulatory Status: Ambulatory Discharge Destination: Home Transportation: Private Auto Accompanied By:  self Schedule Follow-up Appointment: Yes Medication Reconciliation completed and provided to Patient/Care No [place]: Provided on Clinical Summary of Care: 04/27/2016 Form Type Recipient Paper Patient GC Electronic Signature(s) Signed: 04/27/2016 1:36:50 PM By: Gwenlyn Perking Entered By: Gwenlyn Perking on 04/27/2016 13:36:49 Hoyos, Durward [name] ([id number]) -------------------------------------------------------------------------------- Lower Extremity Assessment Details Patient Name: Gettis, Brielynn L. Date of Service: 04/27/2016 12:45 PM Medical Record Patient Account Number: [id number] Number: Treating RN: Curtis Sites October 14, 1955 (60 y.o. Other Clinician: Date of Birth/Sex: Female) Treating ROBSON, MICHAEL Primary Care Physician: Sherrie Mustache Physician/Extender: G Referring Physician: Sherrie Mustache Weeks in Treatment: 18 Edema Assessment Assessed: [Left: No] [Right: No] E[Left: dema] [Right: :] Calf Left: Right: Point of Measurement: 31 cm From Medial Instep cm cm Ankle Left: Right: Point of Measurement: 11 cm From Medial Instep cm cm Vascular Assessment Pulses: Posterior Tibial Dorsalis Pedis Palpable: [Right:Yes] Extremity colors, hair growth, and conditions: Extremity Color: [Right:Normal] Hair Growth on Extremity: [Right:No] Temperature of Extremity: [Right:Warm] Capillary Refill: [Right:< 3 seconds] Electronic Signature(s) Signed: 04/27/2016 5:07:29 PM By: Curtis Sites Entered By: Curtis Sites on 04/27/2016 13:07:30 [name], Durward [name] ([id number]) -------------------------------------------------------------------------------- Multi Wound Chart Details Patient Name: Delrossi, Aurilla L. Date of Service: 04/27/2016 12:45 PM Medical Record Patient Account Number: [id number] Number: Treating RN: Curtis Sites 1956/05/09 (60 y.o. Other Clinician: Date of Birth/Sex: Female) Treating ROBSON, MICHAEL Primary Care Physician: Sherrie Mustache Physician/Extender: G Referring Physician: Sherrie Mustache Weeks in Treatment: 18 Vital Signs Height(in): 62 Pulse(bpm): 101 Weight(lbs): 170 Blood Pressure 139/85 (mmHg): Body Mass Index(BMI): 31 Temperature(F): 98.3 Respiratory Rate 18 (breaths/min): Photos: [N/A:N/A] Wound Location: Right Lower Leg - Medial N/A N/A Wounding Event: Blister N/A N/A Primary Etiology: Venous Leg Ulcer N/A N/A Date Acquired: 12/15/2015 N/A N/A Weeks of Treatment: 18 N/A N/A Wound Status: Open N/A N/A Measurements L x W x D 1.2x0.6x0.1 N/A N/A (cm) Area (cm) : 0.565 N/A N/A Volume (cm) : 0.057 N/A N/A % Reduction in Area: 97.80% N/A N/A % Reduction in Volume: 97.80% N/A N/A Classification: Partial Thickness N/A N/A Exudate Amount: Large N/A N/A Exudate Type:  Serosanguineous N/A N/A Exudate Color: red, brown N/A N/A Wound Margin: Flat and Intact N/A N/A Granulation Amount: Large (67-100%) N/A N/A Granulation Quality: Red, Hyper-granulation, N/A N/A Friable Necrotic Amount: None Present (0%) N/A N/A Ek, Olivianna L. ([id number]) Exposed Structures: Fascia: No N/A N/A Fat: No Tendon: No Muscle: No Joint: No Bone: No Limited to Skin Breakdown Epithelialization: Medium (34-66%) N/A N/A Periwound Skin Texture: Friable: Yes N/A N/A Edema: No Excoriation: No Induration: No Callus: No Crepitus: No Fluctuance: No Rash: No Scarring: No Periwound Skin Moist: Yes N/A N/A Moisture: Maceration: No Dry/Scaly: No Periwound Skin Color: Hemosiderin Staining: Yes N/A N/A Atrophie Blanche: No Cyanosis: No Ecchymosis: No Erythema: No Mottled: No Pallor: No Rubor: No Temperature: No Abnormality N/A N/A Tenderness on Yes N/A N/A Palpation: Wound Preparation: Ulcer Cleansing: Other: N/A N/A soap and water Topical Anesthetic Applied: Other: lidocaine 4% Treatment Notes Electronic Signature(s) Signed: 04/27/2016 5:07:29 PM By: Curtis Sites Entered By: Curtis Sites on  04/27/2016 13:31:23 Skorupski, Durward [name] ([id number]) -------------------------------------------------------------------------------- Multi-Disciplinary Care Plan Details Patient Name: [name], [name] Date of Service: 04/27/2016 12:45 PM Medical Record Patient Account Number: [id number] Number: Treating RN: Curtis Sites 19-Sep-1955 (60 y.o. Other Clinician: Date of Birth/Sex: Female) Treating ROBSON, MICHAEL Primary Care Physician: Sherrie Mustache Physician/Extender: G Referring Physician: Sherrie Mustache Weeks in Treatment: 30 Active Inactive Abuse / Safety / Falls / Self Care Management Nursing Diagnoses: Impaired physical mobility Potential for falls Goals: Patient will remain injury free Date Initiated: 12/23/2015 Goal Status: Active Interventions: Assess fall risk on admission and as needed Notes: Nutrition Nursing Diagnoses: Imbalanced nutrition Goals: Patient/caregiver verbalizes understanding of need to maintain therapeutic glucose control per primary care physician Date Initiated: 12/23/2015 Goal Status: Active Interventions: Provide education on nutrition Treatment Activities: Education provided on Nutrition : 04/20/2016 Notes: Orientation to the Wound Care Program DUSTY, LEE ([id number]) Nursing Diagnoses: Knowledge deficit related to the wound healing center program Goals: Patient/caregiver will verbalize understanding of the Wound Healing Center Program Date Initiated: 12/23/2015 Goal Status: Active Interventions: Provide education on orientation to the wound center Notes: Venous Leg Ulcer Nursing Diagnoses: Potential for venous Insuffiency (use before diagnosis confirmed) Goals: Patient will maintain optimal edema control Date Initiated: 12/23/2015 Goal Status: Active Interventions: Provide education on venous insufficiency Notes: Wound/Skin Impairment Nursing Diagnoses: Impaired tissue integrity Goals: Ulcer/skin breakdown will  heal within 14 weeks Date Initiated: 12/23/2015 Goal Status: Active Interventions: Assess ulceration(s) every visit Notes: Electronic Signature(s) Signed: 04/27/2016 5:07:29 PM By: Curtis Sites Entered By: Curtis Sites on 04/27/2016 13:31:16 Cappucci, Durward [name] ([id number]) Kent, Oreatha Elbert Ewings ([id number]) -------------------------------------------------------------------------------- Pain Assessment Details Patient Name: Betton, Aundria L. Date of Service: 04/27/2016 12:45 PM Medical Record Patient Account Number: [id number] Number: Treating RN: Curtis Sites 05/03/1956 (60 y.o. Other Clinician: Date of Birth/Sex: Female) Treating ROBSON, MICHAEL Primary Care Physician: Sherrie Mustache Physician/Extender: G Referring Physician: Sherrie Mustache Weeks in Treatment: 18 Active Problems Location of Pain Severity and Description of Pain Patient Has Paino No Site Locations Pain Management and Medication Current Pain Management: Notes Topical or injectable lidocaine is offered to patient for acute pain when surgical debridement is performed. If needed, Patient is instructed to use over the counter pain medication for the following 24-48 hours after debridement. Wound care MDs do not prescribed pain medications. Patient has chronic pain or uncontrolled pain. Patient has been instructed to make an appointment with their Primary Care Physician for pain management. Electronic Signature(s) Signed: 04/27/2016 5:07:29 PM By: Curtis Sites Entered By: Curtis Sites on 04/27/2016  12:56:58 VERNELLA, NIZNIK ([id number]) -------------------------------------------------------------------------------- Patient/Caregiver Education Details Patient Name: Judon, Harley L. Date of Service: 04/27/2016 12:45 PM Medical Record Patient Account Number: [id number] Number: Treating RN: Curtis Sites [birth year] (60 y.o. Other Clinician: Date of Birth/Gender: Female) Treating  ROBSON, MICHAEL Primary Care Physician: Sherrie Mustache Physician/Extender: G Referring Physician: Augustin Coupe in Treatment: 36 Education Assessment Education Provided To: Patient Education Topics Provided Venous: Handouts: Other: leg elevation Methods: Explain/Verbal Responses: State content correctly Electronic Signature(s) Signed: 04/27/2016 5:07:29 PM By: Curtis Sites Entered By: Curtis Sites on 04/27/2016 13:32:56 Bazinet, Durward [name] ([id number]) -------------------------------------------------------------------------------- Wound Assessment Details Patient Name: Balin, Enjoli L. Date of Service: 04/27/2016 12:45 PM Medical Record Patient Account Number: [id number] Number: Treating RN: Curtis Sites 1955/09/26 (60 y.o. Other Clinician: Date of Birth/Sex: Female) Treating ROBSON, MICHAEL Primary Care Physician: Sherrie Mustache Physician/Extender: G Referring Physician: Sherrie Mustache Weeks in Treatment: 18 Wound Status Wound Number: 1 Primary Etiology: Venous Leg Ulcer Wound Location: Right Lower Leg - Medial Wound Status: Open Wounding Event: Blister Date Acquired: 12/15/2015 Weeks Of Treatment: 18 Clustered Wound: No Photos Wound Measurements Length: (cm) 1.2 Width: (cm) 0.6 Depth: (cm) 0.1 Area: (cm) 0.565 Volume: (cm) 0.057 % Reduction in Area: 97.8% % Reduction in Volume: 97.8% Epithelialization: Medium (34-66%) Tunneling: No Undermining: No Wound Description Classification: Partial Thickness Wound Margin: Flat and Intact Exudate Amount: Large Exudate Type: Serosanguineous Exudate Color: red, brown Foul Odor After Cleansing: No Wound Bed Granulation Amount: Large (67-100%) Exposed Structure Granulation Quality: Red, Hyper-granulation, Friable Fascia Exposed: No Necrotic Amount: None Present (0%) Fat Layer Exposed: No Wendt, Darienne L. ([id number]) Tendon Exposed: No Muscle Exposed: No Joint Exposed: No Bone  Exposed: No Limited to Skin Breakdown Periwound Skin Texture Texture Color No Abnormalities Noted: No No Abnormalities Noted: No Callus: No Atrophie Blanche: No Crepitus: No Cyanosis: No Excoriation: No Ecchymosis: No Fluctuance: No Erythema: No Friable: Yes Hemosiderin Staining: Yes Induration: No Mottled: No Localized Edema: No Pallor: No Rash: No Rubor: No Scarring: No Temperature / Pain Moisture Temperature: No Abnormality No Abnormalities Noted: No Tenderness on Palpation: Yes Dry / Scaly: No Maceration: No Moist: Yes Wound Preparation Ulcer Cleansing: Other: soap and water, Topical Anesthetic Applied: Other: lidocaine 4%, Treatment Notes Wound #1 (Right, Medial Lower Leg) 1. Cleansed with: Cleanse wound with antibacterial soap and water 2. Anesthetic Topical Lidocaine 4% cream to wound bed prior to debridement 4. Dressing Applied: Hydrafera Blue 5. Secondary Dressing Applied Dry Gauze 7. Secured with 4-Layer Compression System - Right Lower Extremity Notes unna to anchor Electronic Signature(s) Signed: 04/27/2016 5:07:29 PM By: Curtis Sites Entered By: Curtis Sites on 04/27/2016 13:07:04 Nakayama, Durward [name] ([id number]) Schliep, Durward [name] ([id number]) -------------------------------------------------------------------------------- Vitals Details Patient Name: Combes, Merion L. Date of Service: 04/27/2016 12:45 PM Medical Record Patient Account Number: [id number] Number: Treating RN: Curtis Sites 18-Oct-1955 (60 y.o. Other Clinician: Date of Birth/Sex: Female) Treating ROBSON, MICHAEL Primary Care Physician: Sherrie Mustache Physician/Extender: G Referring Physician: Sherrie Mustache Weeks in Treatment: 18 Vital Signs Time Taken: 12:59 Temperature (F): 98.3 Height (in): 62 Pulse (bpm): 101 Weight (lbs): 170 Respiratory Rate (breaths/min): 18 Body Mass Index (BMI): 31.1 Blood Pressure (mmHg): 139/85 Reference Range: 80 - 120 mg  / dl Electronic Signature(s) Signed: 04/27/2016 5:07:29 PM By: Curtis Sites Entered By: Curtis Sites on 04/27/2016 12:59:24

## 2016-05-04 ENCOUNTER — Encounter: Payer: Medicare Other | Admitting: Internal Medicine

## 2016-05-04 DIAGNOSIS — I87331 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 lower extremity: Secondary | ICD-10-CM | POA: Diagnosis not present

## 2016-05-04 NOTE — Progress Notes (Signed)
[name], [name] ([id number]) Visit Report for 05/04/2016 Chief Complaint Document Details [name], [name] Date of Service: 05/04/2016 1:30 PM Patient Name: [name] Account Number: [id number] Medical Record Treating RN: Huel Coventry [id number] Number: Other Clinician: 20-Apr-1956 (60 y.o. Treating ROBSON, MICHAEL Date of Birth/Sex: Female) Physician/Extender: G Primary Care [name], [facility] Physician: Referring Physician: Sherrie Mustache Weeks in Treatment: 42 Information Obtained from: Patient Chief Complaint Patient is here for review of a fairly substantial wound on her right medial lower leg that is been present for the last week Electronic Signature(s) Signed: 05/04/2016 3:24:48 PM By: [name] Entered By: Baltazar Najjar on 05/04/2016 14:09:42 Hitzeman, [name] ([id number]) -------------------------------------------------------------------------------- HPI Details [name] Date of Service: 05/04/2016 1:30 PM Patient Name: [name] Account Number: [id number] Medical Record Treating RN: Huel Coventry [id number] Number: Other Clinician: 20-Aug-1955 (60 y.o. Treating ROBSON, MICHAEL Date of Birth/Sex: Female) Physician/Extender: G Primary Care [name], [facility] Physician: Referring Physician: Sherrie Mustache Weeks in Treatment: 66 History of Present Illness HPI Description: 12/23/15; this is a 60 year old lady who recently relocated back to [place] from [place]. She is staying with a family friend previously lived with a daughter in [place]. 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 She is on chronic prednisone she is not a diabetic. Dose of prednisone is 10 mg. The patient tells me that roughly a week ago she developed a fairly substantial blister over this area that then ruptured. She was seen in the emergency room on 5/7 an x-ray of the leg was negative she was put on Septra although I don't  think any cultures were done. She also tells me she has had chronic edema in her legs for a number of years. She had a similar presentation to currently in the right lateral lower leg roughly a year ago that she was able to heal on her own. As noted she has systemic lupus but does not have lupus nephritis according to the patient. She has a lot of edema in her lower legs. The ABI's could not be obtained. 12/31/15; the patient's insurance which is [place] makes her out of network for any home health therefore we have been changing her dressing in our facility. I reviewed her trip to the ER on 12/21/2015 her creatinine was within the normal range hemoglobin and white count normal. Culture of the wound was negative. X-ray of the leg showed soft tissue edema no foreign bodies. We have been dressing this with Aquacel Ag due to the amount of ongoing drainage 01/07/16; the patient had her arterial studies this morning I don't have these results area she comes back in to have [place] rewrap since she doesn't have access to home health 01/14/16 I still do not have the results of her arterial studies. Our nurse correctly pointed out that we've been wrapping the left leg without an open wound I think this had to do with the fact that she had so much edema when she came in I was concerned about leaving this unwrapped. 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 We left her left leg unwrapped last week as she has no open wound here today she has extensive edema here. We did order stockings however I believe the company called but they have not called them back. She comes back in today with  extensive edema in the left leg. We have her arterial studies which showed triphasic waves and all locations tested including her posterior tibial and anterior tibial arteries bilaterally. Report listed  no hemodynamically significant bilateral lower extremity arterial stenosis. She has her venous studies later this month. I'm going to rewrap the left leg due to the extent of the edema, transitioning her into the stocking we ordered last week as soon as one is available 01/27/16 the patient arrived today with pooled serosanguineous drainage over the wound it would not absorb into her Aquacel Ag which is somewhat on. In spite of this the dimensions of her wound are down [name], [name] ([id number]) considerably and the wound bed looks healthy without any need for debridement. She is obtained her juxtalite stockings the left leg which has no open wound. We are waiting for the right leg wound to get smaller before ordering her one for the right leg 02/03/16; patient wound looked improved. No debridement was required. We've been using the juxtalite stocking on the left leg which is no open wound. 02/11/16 wound is remarkably better. No debridement was required. Healthy rim of epithelialization. We have been using Aquacel Ag, we'll try to close this down with RTD over the next 2 weeks 02/18/16 wound continues to improve down a centimeter in length and width. No debridement was required. Advancing epithelialization. We started RTD last week 02/25/16: pt brought juxtalite for right lower leg today. nurse reports RTD was dry and adhered to the wound. required removal by NS. wound bed with 100% healthy red granulation tissue. denies systemic s/s of infection. 03/02/16; Considerable improvement. no debridement. 03/09/16; wound bed is very vascular no major change in dimensions. Continuing with RTD 03/16/16; unfortunately no major change in dimensions however the wound bed looks very healthy. I will continue with RTD for one more week. If his stalls consider change to collagen. 03/30/2016 -- she's been using juxta lites on her left lower extremity but her lymphedema is very significantly increased and she is  not tolerating the juxta lites the edema has increased symptoms significantly 04/06/16; apparently the RTD started sticking to the wound therefore she was changed to sorbact. Her 4-layer wrap appears to have slipped one third of the way down her leg. She was tolerating the juxtalite on the left leg, I don't see the need to wrap the left leg. She is not a candidate for external compression pumps secondary to insurance issues. 04/13/16; wound is actually measuring larger. Has been using Sorbact 04/20/16; although the wound dimensions of really not changed all that much, the better this certainly looks healthy and there is advancing epithelialization from both sides. No evidence of infection 04/27/16 changed to [facility] last week and the dimensions appear to be a lot better. No evidence of infection. She does not put the juxtalite stocking on the left leg on correctly nevertheless she has never had an open wound here. I am really uncertain whether we ever got 2 to juxtalite stockings for her. The wound started as a blister 05/04/16; finally with Hydrofera Blue last week the area on the right leg has closed over. However in inspection of her left leg she now has a small wound on the posterior aspect of the left leg. She has been using juxta lites on this area but probably not really applying them correctly and with enough tension. She is not a candidate for compression pumps secondary to insurance issues Electronic Signature(s) Signed: 05/04/2016 3:24:48 PM By: Baltazar Najjar  MD Entered By: Baltazar Najjar on 05/04/2016 14:10:46 Rennels, [name] ([id number]) -------------------------------------------------------------------------------- Physical Exam Details [name] Date of Service: 05/04/2016 1:30 PM Patient Name: [name] Account Number: [id number] Medical Record Treating RN: Huel Coventry [id number] Number: Other Clinician: 1956-04-06 (60 y.o. Treating ROBSON, MICHAEL Date of  Birth/Sex: Female) Physician/Extender: G Primary Care [name], [facility] Physician: Referring Physician: Sherrie Mustache Weeks in Treatment: 19 Constitutional Sitting or standing Blood Pressure is within target range for patient.. Pulse regular and within target range for patient.Marland Kitchen Respirations regular, non-labored and within target range.. Temperature is normal and within the target range for the patient.. Eyes Conjunctivae clear. No discharge.Marland Kitchen Respiratory Respiratory effort is easy and symmetric bilaterally. Rate is normal at rest and on room air.. Bilateral breath sounds are clear and equal in all lobes with no wheezes, rales or rhonchi.. Cardiovascular Heart rhythm and rate regular, without murmur or gallop.. Pedal pulses palpable and strong bilaterally.Marland Kitchen Extensive edema in the left leg which is probably a combination of venous insufficiency and lymphedema.. Gastrointestinal (GI) Abdomen is soft and non-distended without masses or tenderness. Bowel sounds active in all quadrants.. No liver or spleen enlargement or tenderness.. Lymphatic Nonpalpable no popliteal or inguinal. Psychiatric No evidence of depression, anxiety, or agitation. Calm, cooperative, and communicative. Appropriate interactions and affect.. Notes Wound exam; mixture of lymphedema chronic venous insufficiency. Finally the area on the right leg has closed however she has a new area on the posterior left leg which is a small wound. I elected to address both legs with silver alginate over the remnants on the right and the new wound on the left. I have asked her to bring her juxtalite stockings in next week Electronic Signature(s) Signed: 05/04/2016 3:24:48 PM By: [name] Entered By: Baltazar Najjar on 05/04/2016 14:15:32 Gundry, [name] ([id number]) -------------------------------------------------------------------------------- Physician Orders Details [name] Date of Service: 05/04/2016 1:30  PM Patient Name: [name] Account Number: [id number] Medical Record Treating RN: Huel Coventry [id number] Number: Other Clinician: Mar 26, 1956 (60 y.o. Treating ROBSON, MICHAEL Date of Birth/Sex: Female) Physician/Extender: G Primary Care [name], FAYEGH Physician: Referring Physician: Sherrie Mustache Weeks in Treatment: 84 Verbal / Phone Orders: Yes Clinician: Huel Coventry Read Back and Verified: No Diagnosis Coding ICD-10 Coding Code Description 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 lower I87.331 extremity I87.322 Chronic venous hypertension (idiopathic) with inflammation of left lower extremity M32.10 Systemic lupus erythematosus, organ or system involvement unspecified L97.211 Non-pressure chronic ulcer of right calf limited to breakdown of skin Wound Cleansing Wound #1 Right,Medial Lower Leg o Cleanse wound with mild soap and water Wound #2 Left,Posterior Lower Leg o Cleanse wound with mild soap and water Skin Barriers/Peri-Wound Care Wound #1 Right,Medial Lower Leg o Moisturizing lotion Wound #2 Left,Posterior Lower Leg o Moisturizing lotion Primary Wound Dressing Wound #1 Right,Medial Lower Leg o Aquacel Ag Wound #2 Left,Posterior Lower Leg o Aquacel Ag Secondary Dressing Wound #1 Right,Medial Lower Leg o ABD pad Depaz, Sanii L. ([id number]) Wound #2 Left,Posterior Lower Leg o ABD pad Dressing Change Frequency Wound #1 Right,Medial Lower Leg o Change dressing every week Wound #2 Left,Posterior Lower Leg o Change dressing every week Follow-up Appointments Wound #1 Right,Medial Lower Leg o Return Appointment in 1 week. Wound #2 Left,Posterior Lower Leg o Return Appointment in 1 week. Edema Control Wound #1 Right,Medial Lower Leg o 4 Layer Compression System - Bilateral Additional Orders / Instructions Wound #1 Right,Medial Lower Leg o Increase protein intake. Wound #2 Left,Posterior Lower  Leg o Increase protein  intake. Electronic Signature(s) Signed: 05/04/2016 3:24:48 PM By: [name] Signed: 05/04/2016 4:47:59 PM By: Elliot Gurney RN, [name], [name], [name] Entered By: Elliot Gurney, RN, [name], Kim on 05/04/2016 14:19:53 Reichelt, [name] ([id number]) -------------------------------------------------------------------------------- Problem List Details [name] Date of Service: 05/04/2016 1:30 PM Patient Name: [name] Account Number: [id number] Medical Record Treating RN: Huel Coventry [id number] Number: Other Clinician: 1956-06-29 (60 y.o. Treating ROBSON, MICHAEL Date of Birth/Sex: Female) Physician/Extender: G Primary Care [name], [facility] Physician: Referring Physician: Sherrie Mustache Weeks in Treatment: 62 Active Problems ICD-10 Encounter Code Description Active Date Diagnosis I87.331 Chronic venous hypertension (idiopathic) with ulcer and 12/23/2015 Yes inflammation of right lower extremity I87.322 Chronic venous hypertension (idiopathic) with 12/23/2015 Yes inflammation of left lower extremity M32.10 Systemic lupus erythematosus, organ or system 12/23/2015 Yes involvement unspecified L97.211 Non-pressure chronic ulcer of right calf limited to 03/02/2016 Yes breakdown of skin Inactive Problems Resolved Problems Electronic Signature(s) Signed: 05/04/2016 3:24:48 PM By: [name] Entered By: Baltazar Najjar on 05/04/2016 14:09:19 Zhen, [name] ([id number]) -------------------------------------------------------------------------------- Progress Note Details Koppelman, Shaely Date of Service: 05/04/2016 1:30 PM Patient Name: [name] Account Number: [id number] Medical Record Treating RN: Huel Coventry [id number] Number: Other Clinician: Jan 10, 1956 (60 y.o. Treating ROBSON, MICHAEL Date of Birth/Sex: Female) Physician/Extender: G Primary Care [name], [facility] Physician: Referring Physician: Sherrie Mustache Weeks in Treatment: 42 Subjective Chief  Complaint Information obtained from Patient Patient is here for review of a fairly substantial wound on her right medial lower leg that is been present for the last week History of Present Illness (HPI) 12/23/15; this is a 60 year old lady who recently relocated back to [place] from [place]. She is staying with a family friend previously lived with a daughter in [place]. 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 She is on chronic prednisone she is not a diabetic. Dose of prednisone is 10 mg. The patient tells me that roughly a week ago she developed a fairly substantial blister over this area that then ruptured. She was seen in the emergency room on 5/7 an x-ray of the leg was negative she was put on Septra although I don't think any cultures were done. She also tells me she has had chronic edema in her legs for a number of years. She had a similar presentation to currently in the right lateral lower leg roughly a year ago that she was able to heal on her own. As noted she has systemic lupus but does not have lupus nephritis according to the patient. She has a lot of edema in her lower legs. The ABI's could not be obtained. 12/31/15; the patient's insurance which is [place] [employer] makes her out of network for any home health therefore we have been changing her dressing in our facility. I reviewed her trip to the ER on 12/21/2015 her creatinine was within the normal range hemoglobin and white count normal. Culture of the wound was negative. X-ray of the leg showed soft tissue edema no foreign bodies. We have been dressing this with Aquacel Ag due to the amount of ongoing drainage 01/07/16; the patient had her arterial studies this morning I don't have these results area she comes back in to have [place] rewrap since she doesn't have access to home health 01/14/16 I still do not have the results of her arterial studies. Our nurse correctly  pointed out that we've been wrapping the left leg without an open wound I think this had  to do with the fact that she had so much edema when she came in I was concerned about leaving this unwrapped. 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 We left her left leg unwrapped last week as she has no open wound here today she has extensive edema here. We did order stockings however I believe the company called but they have not called them Dunleavy, Brinn L. ([id number]) back. She comes back in today with extensive edema in the left leg. We have her arterial studies which showed triphasic waves and all locations tested including her posterior tibial and anterior tibial arteries bilaterally. Report listed no hemodynamically significant bilateral lower extremity arterial stenosis. She has her venous studies later this month. I'm going to rewrap the left leg due to the extent of the edema, transitioning her into the stocking we ordered last week as soon as one is available 01/27/16 the patient arrived today with pooled serosanguineous drainage over the wound it would not absorb into her Aquacel Ag which is somewhat on. In spite of this the dimensions of her wound are down considerably and the wound bed looks healthy without any need for debridement. She is obtained her juxtalite stockings the left leg which has no open wound. We are waiting for the right leg wound to get smaller before ordering her one for the right leg 02/03/16; patient wound looked improved. No debridement was required. We've been using the juxtalite stocking on the left leg which is no open wound. 02/11/16 wound is remarkably better. No debridement was required. Healthy rim of epithelialization. We have been using Aquacel Ag, we'll try to close this down with RTD over the next 2 weeks 02/18/16 wound continues to  improve down a centimeter in length and width. No debridement was required. Advancing epithelialization. We started RTD last week 02/25/16: pt brought juxtalite for right lower leg today. nurse reports RTD was dry and adhered to the wound. required removal by NS. wound bed with 100% healthy red granulation tissue. denies systemic s/s of infection. 03/02/16; Considerable improvement. no debridement. 03/09/16; wound bed is very vascular no major change in dimensions. Continuing with RTD 03/16/16; unfortunately no major change in dimensions however the wound bed looks very healthy. I will continue with RTD for one more week. If his stalls consider change to collagen. 03/30/2016 -- she's been using juxta lites on her left lower extremity but her lymphedema is very significantly increased and she is not tolerating the juxta lites the edema has increased symptoms significantly 04/06/16; apparently the RTD started sticking to the wound therefore she was changed to sorbact. Her 4-layer wrap appears to have slipped one third of the way down her leg. She was tolerating the juxtalite on the left leg, I don't see the need to wrap the left leg. She is not a candidate for external compression pumps secondary to insurance issues. 04/13/16; wound is actually measuring larger. Has been using Sorbact 04/20/16; although the wound dimensions of really not changed all that much, the better this certainly looks healthy and there is advancing epithelialization from both sides. No evidence of infection 04/27/[facility] and the dimensions appear to be a lot better. No evidence of infection. She does not put the juxtalite stocking on the left leg on correctly nevertheless she has never had an open wound here. I am really uncertain  whether we ever got 2 to juxtalite stockings for her. The wound started as a blister 05/04/16; finally with Hydrofera Blue last week the area on the right leg has closed  over. However in inspection of her left leg she now has a small wound on the posterior aspect of the left leg. She has been using juxta lites on this area but probably not really applying them correctly and with enough tension. She is not a candidate for compression pumps secondary to insurance issues Objective Sisney, Sharhonda L. ([id number]) Constitutional Sitting or standing Blood Pressure is within target range for patient.. Pulse regular and within target range for patient.Marland Kitchen Respirations regular, non-labored and within target range.. Temperature is normal and within the target range for the patient.. Vitals Time Taken: 1:44 PM, Height: 62 in, Weight: 170 lbs, BMI: 31.1, Temperature: 98.4 F, Pulse: 91 bpm, Respiratory Rate: 18 breaths/min, Blood Pressure: 169/80 mmHg. Eyes Conjunctivae clear. No discharge.Marland Kitchen Respiratory Respiratory effort is easy and symmetric bilaterally. Rate is normal at rest and on room air.. Bilateral breath sounds are clear and equal in all lobes with no wheezes, rales or rhonchi.. Cardiovascular Heart rhythm and rate regular, without murmur or gallop.. Pedal pulses palpable and strong bilaterally.Marland Kitchen Extensive edema in the left leg which is probably a combination of venous insufficiency and lymphedema.. Gastrointestinal (GI) Abdomen is soft and non-distended without masses or tenderness. Bowel sounds active in all quadrants.. No liver or spleen enlargement or tenderness.. Lymphatic Nonpalpable no popliteal or inguinal. Psychiatric No evidence of depression, anxiety, or agitation. Calm, cooperative, and communicative. Appropriate interactions and affect.. General Notes: Wound exam; mixture of lymphedema chronic venous insufficiency. Finally the area on the right leg has closed however she has a new area on the posterior left leg which is a small wound. I elected to address both legs with silver alginate over the remnants on the right and the new wound on the  left. I have asked her to bring her juxtalite stockings in next week Integumentary (Hair, Skin) Wound #1 status is Open. Original cause of wound was Blister. The wound is located on the Right,Medial Lower Leg. The wound measures 0.1cm length x 0.1cm width x 0.1cm depth; 0.008cm^2 area and 0.001cm^3 volume. Wound #2 status is Open. Original cause of wound was Gradually Appeared. The wound is located on the Left,Posterior Lower Leg. The wound measures 1.2cm length x 0.9cm width x 0.1cm depth; 0.848cm^2 area and 0.085cm^3 volume. The wound is limited to skin breakdown. There is no tunneling or undermining noted. There is a medium amount of serous drainage noted. The wound margin is flat and intact. There is small (1-33%) red granulation within the wound bed. There is a small (1-33%) amount of necrotic tissue within the wound bed including Adherent Slough. The periwound skin appearance exhibited: Induration, Moist, Ecchymosis, Hemosiderin Staining. The periwound skin appearance did not exhibit: Callus, Crepitus, Excoriation, Fluctuance, Friable, Localized Edema, Rash, Scarring, Dry/Scaly, Maceration, Atrophie Kiester, Licia L. ([id number]) Blanche, Cyanosis, Mottled, Pallor, Rubor, Erythema. Periwound temperature was noted as No Abnormality. The periwound has tenderness on palpation. Assessment Active Problems ICD-10 I87.331 - 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 lower extremity I87.322 - Chronic venous hypertension (idiopathic) with inflammation of left lower extremity M32.10 - Systemic lupus erythematosus, organ or system involvement unspecified L97.211 - Non-pressure chronic ulcer of right calf limited to breakdown of skin Plan #1I have elected to treat the new wound on the posterior left leg and the healed area on the right  leg with siver alginate and wrap with porfores Juxtalites next week #2 no evidence of CHF or systemic fluid  overload Electronic Signature(s) Signed: 05/04/2016 3:24:48 PM By: [name] Entered By: Baltazar Najjar on 05/04/2016 14:17:24 Kommer, [name] ([id number]) -------------------------------------------------------------------------------- SuperBill Details Climer, Tesia Date of Service: 05/04/2016 Patient Name: [name] Account Number: [id number] Medical Record Treating RN: Huel Coventry [id number] Number: Other Clinician: March 04, 1956 (60 y.o. Treating ROBSON, MICHAEL Date of Birth/Sex: Female) Physician/Extender: G Primary Care Weeks in Treatment: 61 [name], FAYEGH Physician: Referring Physician: Sherrie Mustache Diagnosis Coding ICD-10 Codes Code Description 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 lower I87.331 extremity I87.322 Chronic venous hypertension (idiopathic) with inflammation of left lower extremity M32.10 Systemic lupus erythematosus, organ or system involvement unspecified L97.211 Non-pressure chronic ulcer of right calf limited to breakdown of skin Facility Procedures CPT4: Description Modifier Quantity Code [id number] BILATERAL: Application of multi-layer venous compression 1 system; leg (below knee), including ankle and foot. 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

## 2016-05-04 NOTE — Progress Notes (Signed)
[name], [name] ([id number]) Visit Report for 05/04/2016 Arrival Information Details Patient Name: [name], [name] Date of Service: 05/04/2016 1:30 PM Medical Record Patient Account Number: [id number] Number: Treating RN: Huel CoventryWoody, Kim 06/04/1956 (60 y.o. Other Clinician: Date of Birth/Sex: Female) Treating ROBSON, MICHAEL Primary Care Physician: Sherrie MustacheJADALI, FAYEGH Physician/Extender: G Referring Physician: Sherrie MustacheJADALI, FAYEGH Weeks in Treatment: 19 Visit Information History Since Last Visit Added or deleted any medications: No Patient Arrived: Ambulatory Any new allergies or adverse reactions: No Arrival Time: 13:41 Had a fall or experienced change in No Accompanied By: self activities of daily living that may affect Transfer Assistance: None risk of falls: Patient Identification Verified: Yes Signs or symptoms of abuse/neglect since last No Secondary Verification Process Yes visito Completed: Hospitalized since last visit: No Patient Requires Transmission-Based No Has Dressing in Place as Prescribed: Yes Precautions: Pain Present Now: No Patient Has Alerts: No Electronic Signature(s) Signed: 05/04/2016 4:47:59 PM By: Elliot GurneyWoody, RN, [name], [name], [name] Entered By: Elliot GurneyWoody, RN, [name], Kim on 05/04/2016 13:43:55 [name], [name] ([id number]) -------------------------------------------------------------------------------- Encounter Discharge Information Details Patient Name: [name], [name] Date of Service: 05/04/2016 1:30 PM Medical Record Patient Account Number: [id number] Number: Treating RN: Huel CoventryWoody, Kim 01/16/1956 (60 y.o. Other Clinician: Date of Birth/Sex: Female) Treating ROBSON, MICHAEL Primary Care Physician: Sherrie MustacheJADALI, FAYEGH Physician/Extender: G Referring Physician: Sherrie MustacheJADALI, FAYEGH Weeks in Treatment: 5219 Encounter Discharge Information Items Discharge Pain Level: 0 Discharge Condition: Stable Ambulatory Status: Ambulatory Discharge Destination:  Home Transportation: Other Accompanied By: son Schedule Follow-up Appointment: Yes Medication Reconciliation completed and provided to Patient/Care Yes Guhan Bruington: Provided on Clinical Summary of Care: 05/04/2016 Form Type Recipient Paper Patient GC Electronic Signature(s) Signed: 05/04/2016 2:23:49 PM By: Gwenlyn PerkingMoore, Shelia Entered By: Gwenlyn PerkingMoore, Shelia on 05/04/2016 14:23:48 [name], [name] ([id number]) -------------------------------------------------------------------------------- Lower Extremity Assessment Details Patient Name: [name], [name] Date of Service: 05/04/2016 1:30 PM Medical Record Patient Account Number: [id number] Number: Treating RN: Huel CoventryWoody, Kim 03/27/1956 (60 y.o. Other Clinician: Date of Birth/Sex: Female) Treating ROBSON, MICHAEL Primary Care Physician: Sherrie MustacheJADALI, FAYEGH Physician/Extender: G Referring Physician: Sherrie MustacheJADALI, FAYEGH Weeks in Treatment: 19 Edema Assessment Assessed: [Left: No] [Right: No] Edema: [Left: Yes] [Right: No] Calf Left: Right: Point of Measurement: 31 cm From Medial Instep 48 cm 38.5 cm Ankle Left: Right: Point of Measurement: 11 cm From Medial Instep 36.2 cm 22.5 cm Vascular Assessment Pulses: Posterior Tibial Dorsalis Pedis Palpable: [Left:Yes] [Right:Yes] Extremity colors, hair growth, and conditions: Extremity Color: [Left:Hyperpigmented] [Right:Hyperpigmented] Hair Growth on Extremity: [Left:No] [Right:No] Temperature of Extremity: [Left:Cool] [Right:Cool] Capillary Refill: [Left:> 3 seconds] [Right:< 3 seconds] Dependent Rubor: [Left:No] [Right:No] Blanched when Elevated: [Left:No] [Right:No] Lipodermatosclerosis: [Left:No] [Right:Yes] Toe Nail Assessment Left: Right: Thick: No No Discolored: No No Deformed: No No Improper Length and Hygiene: No No Electronic Signature(s) [name], [name] ([id number]) Signed: 05/04/2016 4:47:59 PM By: Elliot GurneyWoody, RN, [name], [name], [name] Entered By: Elliot GurneyWoody, RN, [name], Kim on 05/04/2016  13:59:36 [name], [name] ([id number]) -------------------------------------------------------------------------------- Multi Wound Chart Details Patient Name: [name], [name] Date of Service: 05/04/2016 1:30 PM Medical Record Patient Account Number: [id number] Number: Treating RN: Huel CoventryWoody, Kim 03/23/1956 (60 y.o. Other Clinician: Date of Birth/Sex: Female) Treating ROBSON, MICHAEL Primary Care Physician: Sherrie MustacheJADALI, FAYEGH Physician/Extender: G Referring Physician: Sherrie MustacheJADALI, FAYEGH Weeks in Treatment: 19 Vital Signs Height(in): 62 Pulse(bpm): 91 Weight(lbs): 170 Blood Pressure 169/80 (mmHg): Body Mass Index(BMI): 31 Temperature(F): 98.4 Respiratory Rate 18 (breaths/min): Photos: [1:No Photos] [N/A:N/A] Wound Location: Right, Medial Lower Leg Left Lower Leg - Posterior N/A Wounding Event: Blister Gradually Appeared  N/A Primary Etiology: Venous Leg Ulcer Lymphedema N/A Date Acquired: 12/15/2015 04/19/2016 N/A Weeks of Treatment: 19 0 N/A Wound Status: Open Open N/A Measurements [name] 0.1x0.1x0.1 1.2x0.9x0.1 N/A (cm) Area (cm) : 0.008 0.848 N/A Volume (cm) : 0.001 0.085 N/A % Reduction in Area: 100.00% N/A N/A % Reduction in Volume: 100.00% N/A N/A Classification: Partial Thickness Partial Thickness N/A Exudate Amount: N/A Medium N/A Exudate Type: N/A Serous N/A Exudate Color: N/A amber N/A Wound Margin: N/A Flat and Intact N/A Granulation Amount: N/A Small (1-33%) N/A Granulation Quality: N/A Red N/A Necrotic Amount: N/A Small (1-33%) N/A Epithelialization: N/A Large (67-100%) N/A Periwound Skin Texture: No Abnormalities Noted N/A [name], [name] ([id number]) Induration: Yes Edema: No Excoriation: No Callus: No Crepitus: No Fluctuance: No Friable: No Rash: No Scarring: No Periwound Skin No Abnormalities Noted Moist: Yes N/A Moisture: Maceration: No Dry/Scaly: No Periwound Skin Color: No Abnormalities Noted Ecchymosis: Yes N/A Hemosiderin  Staining: Yes Atrophie Blanche: No Cyanosis: No Erythema: No Mottled: No Pallor: No Rubor: No Temperature: N/A No Abnormality N/A Tenderness on No Yes N/A Palpation: Wound Preparation: N/A Ulcer Cleansing: N/A Rinsed/Irrigated with Saline Topical Anesthetic Applied: None Treatment Notes Electronic Signature(s) Signed: 05/04/2016 4:47:59 PM By: Elliot Gurney, RN, [name], [name], [name] Entered By: Elliot Gurney, RN, [name], Kim on 05/04/2016 14:19:06 Degrasse, [name] ([id number]) -------------------------------------------------------------------------------- Multi-Disciplinary Care Plan Details Patient Name: Murrill, [name] Date of Service: 05/04/2016 1:30 PM Medical Record Patient Account Number: [id number] Number: Treating RN: Huel Coventry Feb 20, 1956 (60 y.o. Other Clinician: Date of Birth/Sex: Female) Treating ROBSON, MICHAEL Primary Care Physician: Sherrie Mustache Physician/Extender: G Referring Physician: Sherrie Mustache Weeks in Treatment: 33 Active Inactive Abuse / Safety / Falls / Self Care Management Nursing Diagnoses: Impaired physical mobility Potential for falls Goals: Patient will remain injury free Date Initiated: 12/23/2015 Goal Status: Active Interventions: Assess fall risk on admission and as needed Notes: Nutrition Nursing Diagnoses: Imbalanced nutrition Goals: Patient/caregiver verbalizes understanding of need to maintain therapeutic glucose control per primary care physician Date Initiated: 12/23/2015 Goal Status: Active Interventions: Provide education on nutrition Treatment Activities: Education provided on Nutrition : 01/21/2016 Notes: Orientation to the Wound Care Program MECHEL, [name] ([id number]) Nursing Diagnoses: Knowledge deficit related to the wound healing center program Goals: Patient/caregiver will verbalize understanding of the Wound Healing Center Program Date Initiated: 12/23/2015 Goal Status: Active Interventions: Provide education  on orientation to the wound center Notes: Venous Leg Ulcer Nursing Diagnoses: Potential for venous Insuffiency (use before diagnosis confirmed) Goals: Patient will maintain optimal edema control Date Initiated: 12/23/2015 Goal Status: Active Interventions: Provide education on venous insufficiency Notes: Wound/Skin Impairment Nursing Diagnoses: Impaired tissue integrity Goals: Ulcer/skin breakdown will heal within 14 weeks Date Initiated: 12/23/2015 Goal Status: Active Interventions: Assess ulceration(s) every visit Notes: Electronic Signature(s) Signed: 05/04/2016 4:47:59 PM By: Elliot Gurney, RN, [name], [name], [name] Entered By: Elliot Gurney, RN, [name], Kim on 05/04/2016 14:18:57 Stroh, [name] ([id number]) Lea, Danyel Elbert Ewings ([id number]) -------------------------------------------------------------------------------- Pain Assessment Details Patient Name: Carn, [name] Date of Service: 05/04/2016 1:30 PM Medical Record Patient Account Number: [id number] Number: Treating RN: Huel Coventry 07-Dec-1955 (60 y.o. Other Clinician: Date of Birth/Sex: Female) Treating ROBSON, MICHAEL Primary Care Physician: Sherrie Mustache Physician/Extender: G Referring Physician: Sherrie Mustache Weeks in Treatment: 36 Active Problems Location of Pain Severity and Description of Pain Patient Has Paino Yes Site Locations Pain Location: Generalized Pain, Pain in Ulcers With Dressing Change: No Duration of the Pain. Constant / Intermittento Constant Rate the pain. Current Pain Level:  5 Worst Pain Level: 8 Character of Pain Describe the Pain: Heavy, Sharp Pain Management and Medication Current Pain Management: Notes Topical or injectable lidocaine is offered to patient for acute pain when surgical debridement is performed. If needed, Patient is instructed to use over the counter pain medication for the following 24-48 hours after debridement. Wound care MDs do not prescribed pain medications.  Patient has chronic pain or uncontrolled pain. Patient has been instructed to make an appointment with their Primary Care Physician for pain management. Electronic Signature(s) Signed: 05/04/2016 4:47:59 PM By: Elliot Gurney, RN, [name], [name], [name] Entered By: Elliot Gurney, RN, [name], Kim on 05/04/2016 13:44:23 Remer, [name] ([id number]) -------------------------------------------------------------------------------- Patient/Caregiver Education Details Patient Name: Shannahan, [name] Date of Service: 05/04/2016 1:30 PM Medical Record Patient Account Number: [id number] Number: Treating RN: Huel Coventry 12/04/1955 (60 y.o. Other Clinician: Date of Birth/Gender: Female) Treating ROBSON, MICHAEL Primary Care Physician: Sherrie Mustache Physician/Extender: G Referring Physician: Augustin Coupe in Treatment: 54 Education Assessment Education Provided To: Patient Education Topics Provided Venous: Handouts: Controlling Swelling with Compression Stockings Methods: Demonstration Responses: State content correctly Electronic Signature(s) Signed: 05/04/2016 4:47:59 PM By: Elliot Gurney, RN, [name], [name], [name] Entered By: Elliot Gurney, RN, [name], Kim on 05/04/2016 14:21:33 Anglada, [name] ([id number]) -------------------------------------------------------------------------------- Wound Assessment Details Patient Name: Lavell, [name] Date of Service: 05/04/2016 1:30 PM Medical Record Patient Account Number: [id number] Number: Treating RN: Huel Coventry [birth year] (60 y.o. Other Clinician: Date of Birth/Sex: Female) Treating ROBSON, MICHAEL Primary Care Physician: Sherrie Mustache Physician/Extender: G Referring Physician: Sherrie Mustache Weeks in Treatment: 19 Wound Status Wound Number: 1 Primary Etiology: Venous Leg Ulcer Wound Location: Right, Medial Lower Leg Wound Status: Open Wounding Event: Blister Date Acquired: 12/15/2015 Weeks Of Treatment: 19 Clustered Wound: No Photos Photo  Uploaded By: Elliot Gurney, RN, [name], Kim on 05/04/2016 16:45:24 Wound Measurements Length: (cm) 0.1 Width: (cm) 0.1 Depth: (cm) 0.1 Area: (cm) 0.008 Volume: (cm) 0.001 % Reduction in Area: 100% % Reduction in Volume: 100% Wound Description Classification: Partial Thickness Periwound Skin Texture Texture Color No Abnormalities Noted: No No Abnormalities Noted: No Moisture No Abnormalities Noted: No Treatment Notes Wound #1 (Right, Medial Lower Leg) 1. Cleansed with: [place]street, [name] ([id number]) Clean wound with Normal Saline Cleanse wound with antibacterial soap and water 4. Dressing Applied: Aquacel Ag 5. Secondary Dressing Applied ABD Pad 7. Secured with 4 Layer Compression System - Bilateral Electronic Signature(s) Signed: 05/04/2016 4:47:59 PM By: Elliot Gurney, RN, [name], [name], [name] Entered By: Elliot Gurney, RN, [name], Kim on 05/04/2016 13:54:24 Randhawa, [name] ([id number]) -------------------------------------------------------------------------------- Wound Assessment Details Patient Name: Bacci, [name] Date of Service: 05/04/2016 1:30 PM Medical Record Patient Account Number: [id number] Number: Treating RN: Huel Coventry July 15, 1956 (60 y.o. Other Clinician: Date of Birth/Sex: Female) Treating ROBSON, MICHAEL Primary Care Physician: Sherrie Mustache Physician/Extender: G Referring Physician: Sherrie Mustache Weeks in Treatment: 19 Wound Status Wound Number: 2 Primary Etiology: Lymphedema Wound Location: Left Lower Leg - Posterior Wound Status: Open Wounding Event: Gradually Appeared Date Acquired: 04/19/2016 Weeks Of Treatment: 0 Clustered Wound: No Photos Wound Measurements Length: (cm) 1.2 Width: (cm) 0.9 Depth: (cm) 0.1 Area: (cm) 0.848 Volume: (cm) 0.085 % Reduction in Area: % Reduction in Volume: Epithelialization: Large (67-100%) Tunneling: No Undermining: No Wound Description Classification: Partial Thickness Wound Margin: Flat and  Intact Exudate Amount: Medium Exudate Type: Serous Exudate Color: amber Wound Bed Granulation Amount: Small (1-33%) Exposed Structure Granulation Quality: Red Fascia Exposed: No Necrotic Amount: Small (1-33%) Fat Layer Exposed: No Necrotic Quality:  Adherent Slough Tendon Exposed: No Muscle Exposed: No Joint Exposed: No Zilberman, [name] ([id number]) Bone Exposed: No Limited to Skin Breakdown Periwound Skin Texture Texture Color No Abnormalities Noted: No No Abnormalities Noted: No Callus: No Atrophie Blanche: No Crepitus: No Cyanosis: No Excoriation: No Ecchymosis: Yes Fluctuance: No Erythema: No Friable: No Hemosiderin Staining: Yes Induration: Yes Mottled: No Localized Edema: No Pallor: No Rash: No Rubor: No Scarring: No Temperature / Pain Moisture Temperature: No Abnormality No Abnormalities Noted: No Tenderness on Palpation: Yes Dry / Scaly: No Maceration: No Moist: Yes Wound Preparation Ulcer Cleansing: Rinsed/Irrigated with Saline Topical Anesthetic Applied: None Treatment Notes Wound #2 (Left, Posterior Lower Leg) 1. Cleansed with: Clean wound with Normal Saline Cleanse wound with antibacterial soap and water 4. Dressing Applied: Aquacel Ag 5. Secondary Dressing Applied ABD Pad 7. Secured with 4 Layer Compression System - Bilateral Electronic Signature(s) Signed: 05/04/2016 4:47:59 PM By: Elliot Gurney, RN, [name], [name], [name] Entered By: Elliot Gurney, RN, [name], Kim on 05/04/2016 13:54:01 Stepney, [name] ([id number]) -------------------------------------------------------------------------------- Vitals Details Patient Name: Mauceri, [name] Date of Service: 05/04/2016 1:30 PM Medical Record Patient Account Number: [id number] Number: Treating RN: Huel Coventry 01/19/1956 (60 y.o. Other Clinician: Date of Birth/Sex: Female) Treating ROBSON, MICHAEL Primary Care Physician: Sherrie Mustache Physician/Extender: G Referring Physician: Sherrie Mustache Weeks in Treatment: 19 Vital Signs Time Taken: 13:44 Temperature (F): 98.4 Height (in): 62 Pulse (bpm): 91 Weight (lbs): 170 Respiratory Rate (breaths/min): 18 Body Mass Index (BMI): 31.1 Blood Pressure (mmHg): 169/80 Reference Range: 80 - 120 mg / dl Electronic Signature(s) Signed: 05/04/2016 4:47:59 PM By: Elliot Gurney, RN, [name], [name], [name] Entered By: Elliot Gurney, RN, [name], Kim on 05/04/2016 13:44:44

## 2016-05-11 ENCOUNTER — Encounter: Payer: Medicare Other | Admitting: Internal Medicine

## 2016-05-11 DIAGNOSIS — I87331 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 lower extremity: Secondary | ICD-10-CM | POA: Diagnosis not present

## 2016-05-12 NOTE — Progress Notes (Signed)
[name], [name] ([id number]) Visit Report for 05/11/2016 Chief Complaint Document Details [name], [name] Date of Service: 05/11/2016 2:15 PM Patient Name: [name] Account Number: [id number] Medical Record Treating RN: Curtis Sites [id number] Number: Other Clinician: 08-03-1956 (60 y.o. Treating ROBSON, MICHAEL Date of Birth/Sex: Female) Physician/Extender: G Primary Care [name], [facility] Physician: Referring Physician: Sherrie Mustache Weeks in Treatment: 20 Information Obtained from: Patient Chief Complaint Patient is here for review of a fairly substantial wound on her right medial lower leg that is been present for the last week Electronic Signature(s) Signed: 05/12/2016 8:02:21 AM By: [name] Entered By: Baltazar Najjar on 05/11/2016 15:45:25 Zanni, [name] ([id number]) -------------------------------------------------------------------------------- HPI Details [name] Date of Service: 05/11/2016 2:15 PM Patient Name: [name] Account Number: [id number] Medical Record Treating RN: Curtis Sites [id number] Number: Other Clinician: July 11, 1956 (60 y.o. Treating ROBSON, MICHAEL Date of Birth/Sex: Female) Physician/Extender: G Primary Care [name], [facility] Physician: Referring Physician: Sherrie Mustache Weeks in Treatment: 20 History of Present Illness HPI Description: 12/23/15; this is a 60 year old lady who recently relocated back to [place] from [place]. She is staying with a family friend previously lived with a daughter in [place]. 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 She is on chronic prednisone she is not a diabetic. Dose of prednisone is 10 mg. The patient tells me that roughly a week ago she developed a fairly substantial blister over this area that then ruptured. She was seen in the emergency room on 5/7 an x-ray of the leg was negative she was put on Septra although  I don't think any cultures were done. She also tells me she has had chronic edema in her legs for a number of years. She had a similar presentation to currently in the right lateral lower leg roughly a year ago that she was able to heal on her own. As noted she has systemic lupus but does not have lupus nephritis according to the patient. She has a lot of edema in her lower legs. The ABI's could not be obtained. 12/31/15; the patient's insurance which is [place] makes her out of network for any home health therefore we have been changing her dressing in our facility. I reviewed her trip to the ER on 12/21/2015 her creatinine was within the normal range hemoglobin and white count normal. Culture of the wound was negative. X-ray of the leg showed soft tissue edema no foreign bodies. We have been dressing this with Aquacel Ag due to the amount of ongoing drainage 01/07/16; the patient had her arterial studies this morning I don't have these results area she comes back in to have [place] rewrap since she doesn't have access to home health 01/14/16 I still do not have the results of her arterial studies. Our nurse correctly pointed out that we've been wrapping the left leg without an open wound I think this had to do with the fact that she had so much edema when she came in I was concerned about leaving this unwrapped. 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 We left her left leg unwrapped last week as she has no open wound here today she has extensive edema here. We did order stockings however I believe the company called but they have not called them back. She comes back in today with  extensive edema in the left leg. We have her arterial studies which showed triphasic waves and all locations tested including her posterior tibial and anterior tibial arteries bilaterally.  Report listed no hemodynamically significant bilateral lower extremity arterial stenosis. She has her venous studies later this month. I'm going to rewrap the left leg due to the extent of the edema, transitioning her into the stocking we ordered last week as soon as one is available 01/27/16 the patient arrived today with pooled serosanguineous drainage over the wound it would not absorb into her Aquacel Ag which is somewhat on. In spite of this the dimensions of her wound are down [name], [name] ([id number]) considerably and the wound bed looks healthy without any need for debridement. She is obtained her juxtalite stockings the left leg which has no open wound. We are waiting for the right leg wound to get smaller before ordering her one for the right leg 02/03/16; patient wound looked improved. No debridement was required. We've been using the juxtalite stocking on the left leg which is no open wound. 02/11/16 wound is remarkably better. No debridement was required. Healthy rim of epithelialization. We have been using Aquacel Ag, we'll try to close this down with RTD over the next 2 weeks 02/18/16 wound continues to improve down a centimeter in length and width. No debridement was required. Advancing epithelialization. We started RTD last week 02/25/16: pt brought juxtalite for right lower leg today. nurse reports RTD was dry and adhered to the wound. required removal by NS. wound bed with 100% healthy red granulation tissue. denies systemic s/s of infection. 03/02/16; Considerable improvement. no debridement. 03/09/16; wound bed is very vascular no major change in dimensions. Continuing with RTD 03/16/16; unfortunately no major change in dimensions however the wound bed looks very healthy. I will continue with RTD for one more week. If his stalls consider change to collagen. 03/30/2016 -- she's been using juxta lites on her left lower extremity but her lymphedema is very significantly increased  and she is not tolerating the juxta lites the edema has increased symptoms significantly 04/06/16; apparently the RTD started sticking to the wound therefore she was changed to sorbact. Her 4-layer wrap appears to have slipped one third of the way down her leg. She was tolerating the juxtalite on the left leg, I don't see the need to wrap the left leg. She is not a candidate for external compression pumps secondary to insurance issues. 04/13/16; wound is actually measuring larger. Has been using Sorbact 04/20/16; although the wound dimensions of really not changed all that much, the better this certainly looks healthy and there is advancing epithelialization from both sides. No evidence of infection 04/27/16 changed to [facility] last week and the dimensions appear to be a lot better. No evidence of infection. She does not put the juxtalite stocking on the left leg on correctly nevertheless she has never had an open wound here. I am really uncertain whether we ever got 2 to juxtalite stockings for her. The wound started as a blister 05/04/16; finally with Hydrofera Blue last week the area on the right leg has closed over. However in inspection of her left leg she now has a small wound on the posterior aspect of the left leg. She has been using juxta lites on this area but probably not really applying them correctly and with enough tension. She is not a candidate for compression pumps secondary to insurance issues 05/11/16; the area on her right anterior leg and  closed over last week however we noted an area on her left posterior calf. I elected to wrap both her legs in anticipation we be able to transition her to juxtalite stockings. She arrives today the wraps and fallen down on both sides. The area on the right anterior leg has reopened she also had a very tense blister on the right anterior lateral leg just above her wound which I elected to excise. The area on the posterior left leg is roughly  about the same Electronic Signature(s) Signed: 05/12/2016 8:02:21 AM By: [name] Entered By: Baltazar Najjar on 05/11/2016 15:46:53 Llorente, [name] ([id number]) -------------------------------------------------------------------------------- Physical Exam Details [name] Date of Service: 05/11/2016 2:15 PM Patient Name: [name] Account Number: [id number] Medical Record Treating RN: Curtis Sites [id number] Number: Other Clinician: Feb 17, 1956 (60 y.o. Treating ROBSON, MICHAEL Date of Birth/Sex: Female) Physician/Extender: G Primary Care [name], [facility] Physician: Referring Physician: Sherrie Mustache Weeks in Treatment: 20 Constitutional Patient is hypertensive.. Pulse regular and within target range for patient.Marland Kitchen Respirations regular, non-labored and within target range.. Temperature is normal and within the target range for the patient.. Patient's appearance is neat and clean. Appears in no acute distress. Well nourished and well developed.. Eyes Conjunctivae clear. No discharge.Marland Kitchen Respiratory Respiratory effort is easy and symmetric bilaterally. Rate is normal at rest and on room air.. Bilateral breath sounds are clear and equal in all lobes with no wheezes, rales or rhonchi.. Cardiovascular Heart rhythm and rate regular, without murmur or gallop. JVP is not elevated there is no coccyx edema. Edema present in both extremities. Very significant edema in her bilateral legs much worse than last week this actually extends upper thighs posteriorly. Gastrointestinal (GI) Abdomen is soft and non-distended without masses or tenderness. Bowel sounds active in all quadrants.. No liver or spleen enlargement or tenderness.. Lymphatic None palpable in the popliteal and inguinal area. Integumentary (Hair, Skin) Bruises on her bilateral arm are probably the result of chronic prednisone use. Psychiatric Patient appears depressed today.. Notes Wound exam; once again the  area on her right anterior leg is a large but superficial wound. She has a large tense blister above this that I excised in anticipation that this would not altogether. The area on her left posterior leg is about the same as last week certainly no worse although the edema around it is significant and it appears to be a being edema fluid. Electronic Signature(s) Signed: 05/12/2016 8:02:21 AM By: [name] Entered By: Baltazar Najjar on 05/11/2016 15:49:43 Bello, [name] ([id number]) PREESHA, BENJAMIN ([id number]) -------------------------------------------------------------------------------- Physician Orders Details [name] Date of Service: 05/11/2016 2:15 PM Patient Name: [name] Account Number: [id number] Medical Record Treating RN: Curtis Sites [id number] Number: Other Clinician: 1955-09-22 (60 y.o. Treating ROBSON, MICHAEL Date of Birth/Sex: Female) Physician/Extender: G Primary Care [name], FAYEGH Physician: Referring Physician: Sherrie Mustache Weeks in Treatment: 20 Verbal / Phone Orders: Yes Clinician: Curtis Sites Read Back and Verified: Yes Diagnosis Coding Wound Cleansing Wound #1 Right,Medial Lower Leg o Cleanse wound with mild soap and water Wound #2 Left,Posterior Lower Leg o Cleanse wound with mild soap and water Skin Barriers/Peri-Wound Care Wound #1 Right,Medial Lower Leg o Moisturizing lotion Wound #2 Left,Posterior Lower Leg o Moisturizing lotion Primary Wound Dressing Wound #1 Right,Medial Lower Leg o Aquacel Ag Wound #2 Left,Posterior Lower Leg o Aquacel Ag Secondary Dressing Wound #1 Right,Medial Lower Leg o ABD pad - xtrasorb if needed for drainage Wound #2 Left,Posterior Lower Leg o ABD pad - xtrasorb if needed  for drainage Dressing Change Frequency Wound #1 Right,Medial Lower Leg o Other: - twice weekly - once at [facility] and once by [facility] Montagna, Marney L. ([id number]) Wound #2  Left,Posterior Lower Leg o Other: - twice weekly - once at [facility] and once by [facility] Follow-up Appointments Wound #1 Right,Medial Lower Leg o Return Appointment in 1 week. Wound #2 Left,Posterior Lower Leg o Return Appointment in 1 week. Edema Control Wound #1 Right,Medial Lower Leg o 4 Layer Compression System - Bilateral Wound #2 Left,Posterior Lower Leg o 4 Layer Compression System - Bilateral Additional Orders / Instructions Wound #1 Right,Medial Lower Leg o Increase protein intake. Wound #2 Left,Posterior Lower Leg o Increase protein intake. Home Health Wound #1 Right,Medial Lower Leg o Initiate Home Health for Skilled Nursing o Home Health Nurse may visit PRN to address patientos wound care needs. o FACE TO FACE ENCOUNTER: MEDICARE and MEDICAID PATIENTS: I certify that this patient is under my care and that I had a face-to-face encounter that meets the physician face-to-face encounter requirements with this patient on this date. The encounter with the patient was in whole or in part for the following MEDICAL CONDITION: (primary reason for Home Healthcare) MEDICAL NECESSITY: I certify, that based on my findings, NURSING services are a medically necessary home health service. HOME BOUND STATUS: I certify that my clinical findings support that this patient is homebound (i.e., Due to illness or injury, pt requires aid of supportive devices such as crutches, cane, wheelchairs, walkers, the use of special transportation or the assistance of another person to leave their place of residence. There is a normal inability to leave the home and doing so requires considerable and taxing effort. Other absences are for medical reasons / religious services and are infrequent or of short duration when for other reasons). o If current dressing causes regression in wound condition, may D/C ordered dressing product/s and apply Normal Saline Moist Dressing  daily until next Wound Healing Center / Other MD appointment. Notify Wound Healing Center of regression in wound condition at [phone]. o Please direct any NON-WOUND related issues/requests for orders to patient's Primary Care Physician Wound #2 Left,Posterior Lower Leg JIMENA, WIECZOREK ([id number]) o Initiate Home Health for Skilled Nursing o Home Health Nurse may visit PRN to address patientos wound care needs. o FACE TO FACE ENCOUNTER: MEDICARE and MEDICAID PATIENTS: I certify that this patient is under my care and that I had a face-to-face encounter that meets the physician face-to-face encounter requirements with this patient on this date. The encounter with the patient was in whole or in part for the following MEDICAL CONDITION: (primary reason for Home Healthcare) MEDICAL NECESSITY: I certify, that based on my findings, NURSING services are a medically necessary home health service. HOME BOUND STATUS: I certify that my clinical findings support that this patient is homebound (i.e., Due to illness or injury, pt requires aid of supportive devices such as crutches, cane, wheelchairs, walkers, the use of special transportation or the assistance of another person to leave their place of residence. There is a normal inability to leave the home and doing so requires considerable and taxing effort. Other absences are for medical reasons / religious services and are infrequent or of short duration when for other reasons). o If current dressing causes regression in wound condition, may D/C ordered dressing product/s and apply Normal Saline Moist Dressing daily until next Wound Healing Center / Other MD appointment. Notify Wound Healing Center of regression  in wound condition at [phone]. o Please direct any NON-WOUND related issues/requests for orders to patient's Primary Care Physician Electronic Signature(s) Signed: 05/11/2016 5:05:30 PM By: Curtis Sites Signed:  05/12/2016 8:02:21 AM By: [name] Entered By: Curtis Sites on 05/11/2016 15:22:01 Pownall, [name] ([id number]) -------------------------------------------------------------------------------- Problem List Details [name] Date of Service: 05/11/2016 2:15 PM Patient Name: [name] Account Number: [id number] Medical Record Treating RN: Curtis Sites [id number] Number: Other Clinician: [birth year] (60 y.o. 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 Account Number: [id number] Medical Record Treating RN: Curtis Sites [id number] Number: Other Clinician: 1955-10-26 (60 y.o. Treating ROBSON, MICHAEL Date of Birth/Sex: Female) Physician/Extender: G Primary Care [name], [facility] Physician: Referring Physician: Sherrie Mustache Weeks in Treatment: 20 Subjective Chief Complaint Information obtained from Patient Patient is here for review of a fairly substantial wound on her right medial lower leg  that is been present for the last week History of Present Illness (HPI) 12/23/15; this is a 60 year old lady who recently relocated back to [place] from [place]. She is staying with a family friend previously lived with a daughter in [place]. 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 She is on chronic prednisone she is not a diabetic. Dose of prednisone is 10 mg. The patient tells me that roughly a week ago she developed a fairly substantial blister over this area that then ruptured. She was seen in the emergency room on 5/7 an x-ray of the leg was negative she was put on Septra although I don't think any cultures were done. She also tells me she has had chronic edema in her legs for a number of years. She had a similar presentation to currently in the right lateral lower leg roughly a year ago that she was able to heal on her own. As noted she has systemic lupus but does not have lupus nephritis according to the patient. She has a lot of edema in her lower legs. The ABI's could not be obtained. 12/31/15; the patient's insurance which is [place] makes her out of network for any home health therefore we have been changing her dressing in our facility. I reviewed her trip to the ER on 12/21/2015 her creatinine was within the normal range hemoglobin and white count normal. Culture of the wound was negative. X-ray of the leg showed soft tissue edema no foreign bodies. We have been dressing this with Aquacel Ag due to the amount of ongoing drainage 01/07/16; the patient had her arterial studies this morning I don't have these results area she comes back in to have [place] rewrap since she doesn't have access to home health 01/14/16 I still do not have the results of her arterial studies. Our nurse correctly pointed out that we've been  wrapping the left leg without an open wound I think this had to do with the fact that she had so much  edema when she came in I was concerned about leaving this unwrapped. 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 We left her left leg unwrapped last week as she has no open wound here today she has extensive edema here. We did order stockings however I believe the company called but they have not called them Taul, Sally-Anne L. ([id number]) back. She comes back in today with extensive edema in the left leg. We have her arterial studies which showed triphasic waves and all locations tested including her posterior tibial and anterior tibial arteries bilaterally. Report listed no hemodynamically significant bilateral lower extremity arterial stenosis. She has her venous studies later this month. I'm going to rewrap the left leg due to the extent of the edema, transitioning her into the stocking we ordered last week as soon as one is available 01/27/16 the patient arrived today with pooled serosanguineous drainage over the wound it would not absorb into her Aquacel Ag which is somewhat on. In spite of this the dimensions of her wound are down considerably and the wound bed looks healthy without any need for debridement. She is obtained her juxtalite stockings the left leg which has no open wound. We are waiting for the right leg wound to get smaller before ordering her one for the right leg 02/03/16; patient wound looked improved. No debridement was required. We've been using the juxtalite stocking on the left leg which is no open wound. 02/11/16 wound is remarkably better. No debridement was required. Healthy rim of epithelialization. We have been using Aquacel Ag, we'll try to close this down with RTD over the next 2 weeks 02/18/16 wound continues to improve down a centimeter in length and width. No debridement was required. Advancing epithelialization. We started RTD last  week 02/25/16: pt brought juxtalite for right lower leg today. nurse reports RTD was dry and adhered to the wound. required removal by NS. wound bed with 100% healthy red granulation tissue. denies systemic s/s of infection. 03/02/16; Considerable improvement. no debridement. 03/09/16; wound bed is very vascular no major change in dimensions. Continuing with RTD 03/16/16; unfortunately no major change in dimensions however the wound bed looks very healthy. I will continue with RTD for one more week. If his stalls consider change to collagen. 03/30/2016 -- she's been using juxta lites on her left lower extremity but her lymphedema is very significantly increased and she is not tolerating the juxta lites the edema has increased symptoms significantly 04/06/16; apparently the RTD started sticking to the wound therefore she was changed to sorbact. Her 4-layer wrap appears to have slipped one third of the way down her leg. She was tolerating the juxtalite on the left leg, I don't see the need to wrap the left leg. She is not a candidate for external compression pumps secondary to insurance issues. 04/13/16; wound is actually measuring larger. Has been using Sorbact 04/20/16; although the wound dimensions of really not changed all that much, the better this certainly looks healthy and there is advancing epithelialization from both sides. No evidence of infection 04/27/16 changed to [facility] last week and the dimensions appear to be a lot better. No evidence of infection. She does not put the juxtalite stocking on the left leg on correctly  nevertheless she has never had an open wound here. I am really uncertain whether we ever got 2 to juxtalite stockings for her. The wound started as a blister 05/04/16; finally with Hydrofera Blue last week the area on the right leg has closed over. However in inspection of her left leg she now has a small wound on the posterior aspect of the left leg. She has  been using juxta lites on this area but probably not really applying them correctly and with enough tension. She is not a candidate for compression pumps secondary to insurance issues 05/11/16; the area on her right anterior leg and closed over last week however we noted an area on her left posterior calf. I elected to wrap both her legs in anticipation we be able to transition her to juxtalite stockings. She arrives today the wraps and fallen down on both sides. The area on the right anterior leg has reopened she also had a very tense blister on the right anterior lateral leg just above her wound which I elected to excise. The area on the posterior left leg is roughly about the same ANTONETTE, HENDRICKS. ([id number]) Objective Constitutional Patient is hypertensive.. Pulse regular and within target range for patient.Marland Kitchen Respirations regular, non-labored and within target range.. Temperature is normal and within the target range for the patient.. Patient's appearance is neat and clean. Appears in no acute distress. Well nourished and well developed.. Vitals Time Taken: 2:44 PM, Height: 62 in, Weight: 170 lbs, BMI: 31.1, Temperature: 98.2 F, Pulse: 86 bpm, Respiratory Rate: 18 breaths/min, Blood Pressure: 166/93 mmHg. Eyes Conjunctivae clear. No discharge.Marland Kitchen Respiratory Respiratory effort is easy and symmetric bilaterally. Rate is normal at rest and on room air.. Bilateral breath sounds are clear and equal in all lobes with no wheezes, rales or rhonchi.. Cardiovascular Heart rhythm and rate regular, without murmur or gallop. JVP is not elevated there is no coccyx edema. Edema present in both extremities. Very significant edema in her bilateral legs much worse than last week this actually extends upper thighs posteriorly. Gastrointestinal (GI) Abdomen is soft and non-distended without masses or tenderness. Bowel sounds active in all quadrants.. No liver or spleen enlargement or  tenderness.. Lymphatic None palpable in the popliteal and inguinal area. Psychiatric Patient appears depressed today.. General Notes: Wound exam; once again the area on her right anterior leg is a large but superficial wound. She has a large tense blister above this that I excised in anticipation that this would not altogether. The area on her left posterior leg is about the same as last week certainly no worse although the edema around it is significant and it appears to be a being edema fluid. Integumentary (Hair, Skin) Bruises on her bilateral arm are probably the result of chronic prednisone use. Wound #1 status is Open. Original cause of wound was Blister. The wound is located on the Right,Medial Lower Leg. The wound measures 3.4cm length x 2.6cm width x 0.1cm depth; 6.943cm^2 area and 0.694cm^3 volume. The wound is limited to skin breakdown. There is no tunneling or undermining noted. There is a large amount of serous drainage noted. The wound margin is flat and intact. There is large (67- 100%) red granulation within the wound bed. There is no necrotic tissue within the wound bed. The YILIA, SACCA ([id number]) periwound skin appearance exhibited: Moist. The periwound skin appearance did not exhibit: Callus, Crepitus, Excoriation, Fluctuance, Friable, Induration, Localized Edema, Rash, Scarring, Dry/Scaly, Maceration, Atrophie Blanche, Cyanosis, Ecchymosis, Hemosiderin Staining, Mottled, Pallor,  Rubor, Erythema. Periwound temperature was noted as No Abnormality. Wound #2 status is Open. Original cause of wound was Gradually Appeared. The wound is located on the Left,Posterior Lower Leg. The wound measures 2.1cm length x 1cm width x 0.2cm depth; 1.649cm^2 area and 0.33cm^3 volume. The wound is limited to skin breakdown. There is no tunneling or undermining noted. There is a medium amount of serous drainage noted. The wound margin is flat and intact. There is small (1-33%) red  granulation within the wound bed. There is a small (1-33%) amount of necrotic tissue within the wound bed including Adherent Slough. The periwound skin appearance exhibited: Induration, Moist, Ecchymosis, Hemosiderin Staining. The periwound skin appearance did not exhibit: Callus, Crepitus, Excoriation, Fluctuance, Friable, Localized Edema, Rash, Scarring, Dry/Scaly, Maceration, Atrophie Blanche, Cyanosis, Mottled, Pallor, Rubor, Erythema. Periwound temperature was noted as No Abnormality. The periwound has tenderness on palpation. Assessment Active Problems ICD-10 I87.331 - 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 lower extremity I87.322 - Chronic venous hypertension (idiopathic) with inflammation of left lower extremity M32.10 - Systemic lupus erythematosus, organ or system involvement unspecified L97.211 - Non-pressure chronic ulcer of right calf limited to breakdown of skin Plan Wound Cleansing: Wound #1 Right,Medial Lower Leg: Cleanse wound with mild soap and water Wound #2 Left,Posterior Lower Leg: Cleanse wound with mild soap and water Skin Barriers/Peri-Wound Care: Wound #1 Right,Medial Lower Leg: Moisturizing lotion Wound #2 Left,Posterior Lower Leg: Moisturizing lotion Primary Wound Dressing: Wound #1 Right,Medial Lower Leg: Aquacel Ag Wound #2 Left,Posterior Lower Leg: Nakajima, Dovey L. ([id number]) Aquacel Ag Secondary Dressing: Wound #1 Right,Medial Lower Leg: ABD pad - xtrasorb if needed for drainage Wound #2 Left,Posterior Lower Leg: ABD pad - xtrasorb if needed for drainage Dressing Change Frequency: Wound #1 Right,Medial Lower Leg: Other: - [facility] #2 Left,Posterior Lower Leg: Other: - [facility] App[facility]ments: Wound #1 Right,Medial Lower Leg: Return Appointment in 1 week. Wound #2  Left,Posterior Lower Leg: Return Appointment in 1 week. Edema Control: Wound #1 Right,Medial Lower Leg: 4 Layer Compression System - Bilateral Wound #2 Left,Posterior Lower Leg: 4 Layer Compression System - Bilateral Additional Orders / Instructions: Wound #1 Right,Medial Lower Leg: Increase protein intake. Wound #2 Left,Posterior Lower Leg: Increase protein intake. Home Health: Wound #1 Right,Medial Lower Leg: Initiate Home Health for Skilled Nursing Home Health Nurse may visit PRN to address patient s wound care needs. FACE TO FACE ENCOUNTER: MEDICARE and MEDICAID PATIENTS: I certify that this patient is under my care and that I had a face-to-face encounter that meets the physician face-to-face encounter requirements with this patient on this date. The encounter with the patient was in whole or in part for the following MEDICAL CONDITION: (primary reason for Home Healthcare) MEDICAL NECESSITY: I certify, that based on my findings, NURSING services are a medically necessary home health service. HOME BOUND STATUS: I certify that my clinical findings support that this patient is homebound (i.e., Due to illness or injury, pt requires aid of supportive devices such as crutches, cane, wheelchairs, walkers, the use of special transportation or the assistance of another person to leave their place of residence. There is a normal inability to leave the home and doing so requires considerable and taxing effort. Other absences are for medical reasons / religious services and are infrequent or of short duration when for other reasons). If current dressing causes  regression in wound condition, may D/C ordered dressing product/s and apply Normal Saline Moist Dressing daily until next Wound Healing Center / Other MD appointment. Notify Wound Healing Center of regression in wound condition at [phone]. Please direct any NON-WOUND related issues/requests for orders to patient's Primary Care  Physician Wound #2 Left,Posterior Lower Leg: Initiate Home Health for Skilled Nursing Home Health Nurse may visit PRN to address patient s wound care needs. FACE TO FACE ENCOUNTER: MEDICARE and MEDICAID PATIENTS: I certify that this patient is under my care and that I had a face-to-face encounter that meets the physician face-to-face encounter requirements with this patient on this date. The encounter with the patient was in whole or in part for the following MEDICAL CONDITION: (primary reason for Home Healthcare) MEDICAL NECESSITY: I certify, SAMERA, SUDDS ([id number]) that based on my findings, NURSING services are a medically necessary home health service. HOME BOUND STATUS: I certify that my clinical findings support that this patient is homebound (i.e., Due to illness or injury, pt requires aid of supportive devices such as crutches, cane, wheelchairs, walkers, the use of special transportation or the assistance of another person to leave their place of residence. There is a normal inability to leave the home and doing so requires considerable and taxing effort. Other absences are for medical reasons / religious services and are infrequent or of short duration when for other reasons). If current dressing causes regression in wound condition, may D/C ordered dressing product/s and apply Normal Saline Moist Dressing daily until next Wound Healing Center / Other MD appointment. Notify Wound Healing Center of regression in wound condition at [phone]. Please direct any NON-WOUND related issues/requests for orders to patient's Primary Care Physician #1 we applied silver alginate, ABD pads, xtrasorb to the wounds on her right anterior leg and left posterior leg. #2 the patient's edema is extending up the posterior aspects of her thighs. This is likely a combination of venous stasis and lymphedema although I wondered whether she is fluid overloaded as well. There wasn't a lot of proof  of this however on physical exam. I had thought about putting her on a dose of diuretics to help with the swelling in her legs but it was abundantly clear that we don't even know what medication she is on. #3 in talking to the patient and then later her son who accompanied her was not really clear that she even has a primary doctor or who is renewing her prednisone imply Aquanil that she takes for her systemic lupus. #4 she will clearly need lab work checking her kidney function, albumin,. She might need a spot urine for protein #5 I've asked them to bring in all the medication bottles so we can update what is going on with this patient in terms of her medications and which doctors are following her Electronic Signature(s) Signed: 05/12/2016 8:02:21 AM By: [name] Entered By: Baltazar Najjar on 05/11/2016 15:52:36 Vanderploeg, [name] ([id number]) -------------------------------------------------------------------------------- SuperBill Details Falletta, Margurete Date of Service: 05/11/2016 Patient Name: [name] Account Number: [id number] Medical Record Treating RN: Curtis Sites [id number] Number: Other Clinician: [birth year] (60 y.o. Treating ROBSON, MICHAEL Date of Birth/Sex: Female) Physician/Extender: G Primary Care Weeks in Treatment: 24 [name], FAYEGH Physician: Referring Physician: Sherrie Mustache Diagnosis Coding ICD-10 Codes Code Description 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 lower I87.331 extremity I87.322 Chronic venous hypertension (idiopathic) with inflammation of left lower extremity M32.10 Systemic lupus erythematosus, organ or system involvement unspecified L97.211 Non-pressure  chronic ulcer of right calf limited to breakdown of skin Facility Procedures CPT4: Description Modifier Quantity Code [id number] [phone] BILATERAL: Application of multi-layer venous compression 1 system; leg (below knee), including ankle and  foot. Physician Procedures CPT4: Description Modifier Quantity Code [id number] - WC PHYS LEVEL 4 - EST PT 1 ICD-10 Description Diagnosis I87.331 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 lower extremity Electronic Signature(s) Signed: 05/11/2016 4:58:34 PM By: Curtis Sites Signed: 05/12/2016 8:02:21 AM By: [name] Entered By: Curtis Sites on 05/11/2016 16:58:34

## 2016-05-12 NOTE — Progress Notes (Signed)
[name], [name] ([id number]) Visit Report for 05/11/2016 Arrival Information Details Patient Name: [name], [name] Date of Service: 05/11/2016 2:15 PM Medical Record Patient Account Number: [id number] Number: Treating RN: Curtis SitesDorthy, Joanna 06/11/1956 (60 y.o. Other Clinician: Date of Birth/Sex: Female) Treating ROBSON, MICHAEL Primary Care Physician: Sherrie MustacheJADALI, FAYEGH Physician/Extender: G Referring Physician: Sherrie MustacheJADALI, FAYEGH Weeks in Treatment: 20 Visit Information History Since Last Visit Added or deleted any medications: No Patient Arrived: Ambulatory Any new allergies or adverse reactions: No Arrival Time: 14:44 Had a fall or experienced change in No Accompanied By: self activities of daily living that may affect Transfer Assistance: None risk of falls: Patient Identification Verified: Yes Signs or symptoms of abuse/neglect since last No Secondary Verification Process Yes visito Completed: Hospitalized since last visit: No Patient Requires Transmission-Based No Pain Present Now: No Precautions: Patient Has Alerts: No Electronic Signature(s) Signed: 05/11/2016 5:05:30 PM By: Curtis Sitesorthy, Joanna Entered By: Curtis Sitesorthy, Joanna on 05/11/2016 14:44:48 [name], [name] ([id number]) -------------------------------------------------------------------------------- Encounter Discharge Information Details Patient Name: [name], [name] Date of Service: 05/11/2016 2:15 PM Medical Record Patient Account Number: [id number] Number: Treating RN: Curtis SitesDorthy, Joanna 02/13/1956 (60 y.o. Other Clinician: Date of Birth/Sex: Female) Treating ROBSON, MICHAEL Primary Care Physician: Sherrie MustacheJADALI, FAYEGH Physician/Extender: G Referring Physician: Sherrie MustacheJADALI, FAYEGH Weeks in Treatment: 20 Encounter Discharge Information Items Discharge Pain Level: 0 Discharge Condition: Stable Ambulatory Status: Ambulatory Discharge Destination: Home Transportation: Private Auto Accompanied By:  son Schedule Follow-up Appointment: Yes Medication Reconciliation completed and provided to Patient/Care No Meleni Delahunt: Provided on Clinical Summary of Care: 05/11/2016 Form Type Recipient Paper Patient GC Electronic Signature(s) Signed: 05/11/2016 4:59:26 PM By: Curtis Sitesorthy, Joanna Previous Signature: 05/11/2016 3:47:42 PM Version By: Gwenlyn PerkingMoore, Shelia Entered By: Curtis Sitesorthy, Joanna on 05/11/2016 16:59:26 Claire, [name] ([id number]) -------------------------------------------------------------------------------- Lower Extremity Assessment Details Patient Name: [name], [name] Date of Service: 05/11/2016 2:15 PM Medical Record Patient Account Number: [id number] Number: Treating RN: Curtis SitesDorthy, Joanna 03/06/1956 (60 y.o. Other Clinician: Date of Birth/Sex: Female) Treating ROBSON, MICHAEL Primary Care Physician: Sherrie MustacheJADALI, FAYEGH Physician/Extender: G Referring Physician: Sherrie MustacheJADALI, FAYEGH Weeks in Treatment: 20 Edema Assessment Assessed: [Left: No] [Right: No] Edema: [Left: Yes] [Right: Yes] Calf Left: Right: Point of Measurement: 31 cm From Medial Instep 51 cm 50.5 cm Ankle Left: Right: Point of Measurement: 11 cm From Medial Instep 32.3 cm 25.1 cm Electronic Signature(s) Signed: 05/11/2016 5:05:30 PM By: Curtis Sitesorthy, Joanna Entered By: Curtis Sitesorthy, Joanna on 05/11/2016 14:56:52 Piekarski, [name] ([id number]) -------------------------------------------------------------------------------- Multi Wound Chart Details Patient Name: Intrieri, [name] Date of Service: 05/11/2016 2:15 PM Medical Record Patient Account Number: [id number] Number: Treating RN: Curtis SitesDorthy, Joanna 09/02/1955 (60 y.o. Other Clinician: Date of Birth/Sex: Female) Treating ROBSON, MICHAEL Primary Care Physician: Sherrie MustacheJADALI, FAYEGH Physician/Extender: G Referring Physician: Sherrie MustacheJADALI, FAYEGH Weeks in Treatment: 20 Vital Signs Height(in): 62 Pulse(bpm): 86 Weight(lbs): 170 Blood Pressure 166/93 (mmHg): Body Mass  Index(BMI): 31 Temperature(F): 98.2 Respiratory Rate 18 (breaths/min): Photos: [1:No Photos] [2:No Photos] [N/A:N/A] Wound Location: [1:Right Lower Leg - Medial] [2:Left Lower Leg - Posterior] [N/A:N/A] Wounding Event: [1:Blister] [2:Gradually Appeared] [N/A:N/A] Primary Etiology: [1:Venous Leg Ulcer] [2:Lymphedema] [N/A:N/A] Date Acquired: [1:12/15/2015] [2:04/19/2016] [N/A:N/A] Weeks of Treatment: [1:20] [2:1] [N/A:N/A] Wound Status: [1:Open] [2:Open] [N/A:N/A] Measurements L x W x D 3.4x2.6x0.1 [2:2.1x1x0.2] [N/A:N/A] (cm) Area (cm) : [1:6.943] [2:1.649] [N/A:N/A] Volume (cm) : [1:0.694] [2:0.33] [N/A:N/A] % Reduction in Area: [1:73.20%] [2:-94.50%] [N/A:N/A] % Reduction in Volume: 73.20% [2:-288.20%] [N/A:N/A] Classification: [1:Partial Thickness] [2:Partial Thickness] [N/A:N/A] Exudate Amount: [1:Large] [2:Medium] [N/A:N/A] Exudate Type: [1:Serous] [2:Serous] [N/A:N/A] Exudate Color: [1:amber] [2:amber] [N/A:N/A]  Wound Margin: [1:Flat and Intact] [2:Flat and Intact] [N/A:N/A] Granulation Amount: [1:Large (67-100%)] [2:Small (1-33%)] [N/A:N/A] Granulation Quality: [1:Red] [2:Red] [N/A:N/A] Necrotic Amount: [1:None Present (0%)] [2:Small (1-33%)] [N/A:N/A] Exposed Structures: [1:Fascia: No Fat: No Tendon: No Muscle: No Joint: No Bone: No] [2:Fascia: No Fat: No Tendon: No Muscle: No Joint: No Bone: No] [N/A:N/A] Limited to Skin Limited to Skin Breakdown Breakdown Epithelialization: Small (1-33%) Large (67-100%) N/A Periwound Skin Texture: Edema: No Induration: Yes N/A Excoriation: No Edema: No Induration: No Excoriation: No Callus: No Callus: No Crepitus: No Crepitus: No Fluctuance: No Fluctuance: No Friable: No Friable: No Rash: No Rash: No Scarring: No Scarring: No Periwound Skin Moist: Yes Moist: Yes N/A Moisture: Maceration: No Maceration: No Dry/Scaly: No Dry/Scaly: No Periwound Skin Color: Atrophie Blanche: No Ecchymosis: Yes N/A Cyanosis:  No Hemosiderin Staining: Yes Ecchymosis: No Atrophie Blanche: No Erythema: No Cyanosis: No Hemosiderin Staining: No Erythema: No Mottled: No Mottled: No Pallor: No Pallor: No Rubor: No Rubor: No Temperature: No Abnormality No Abnormality N/A Tenderness on No Yes N/A Palpation: Wound Preparation: Ulcer Cleansing: Other: Ulcer Cleansing: Other: N/A soap and water soap and water Topical Anesthetic Topical Anesthetic Applied: Other: lidocaine Applied: Other: lidocaine 4% 4% Treatment Notes Electronic Signature(s) Signed: 05/11/2016 5:05:30 PM By: Curtis Sites Entered By: Curtis Sites on 05/11/2016 15:10:04 Brickner, [name] ([id number]) -------------------------------------------------------------------------------- Multi-Disciplinary Care Plan Details Patient Name: Sirois, Lauria L. Date of Service: 05/11/2016 2:15 PM Medical Record Patient Account Number: [id number] Number: Treating RN: Curtis Sites 07-Dec-1955 (60 y.o. Other Clinician: Date of Birth/Sex: Female) Treating ROBSON, MICHAEL Primary Care Physician: Sherrie Mustache Physician/Extender: G Referring Physician: Sherrie Mustache Weeks in Treatment: 20 Active Inactive Abuse / Safety / Falls / Self Care Management Nursing Diagnoses: Impaired physical mobility Potential for falls Goals: Patient will remain injury free Date Initiated: 12/23/2015 Goal Status: Active Interventions: Assess fall risk on admission and as needed Notes: Nutrition Nursing Diagnoses: Imbalanced nutrition Goals: Patient/caregiver verbalizes understanding of need to maintain therapeutic glucose control per primary care physician Date Initiated: 12/23/2015 Goal Status: Active Interventions: Provide education on nutrition Treatment Activities: Education provided on Nutrition : 04/20/2016 Notes: Orientation to the Wound Care Program CINTYA, DAUGHETY ([id number]) Nursing Diagnoses: Knowledge deficit related to the wound  healing center program Goals: Patient/caregiver will verbalize understanding of the Wound Healing Center Program Date Initiated: 12/23/2015 Goal Status: Active Interventions: Provide education on orientation to the wound center Notes: Venous Leg Ulcer Nursing Diagnoses: Potential for venous Insuffiency (use before diagnosis confirmed) Goals: Patient will maintain optimal edema control Date Initiated: 12/23/2015 Goal Status: Active Interventions: Provide education on venous insufficiency Notes: Wound/Skin Impairment Nursing Diagnoses: Impaired tissue integrity Goals: Ulcer/skin breakdown will heal within 14 weeks Date Initiated: 12/23/2015 Goal Status: Active Interventions: Assess ulceration(s) every visit Notes: Electronic Signature(s) Signed: 05/11/2016 5:05:30 PM By: Curtis Sites Entered By: Curtis Sites on 05/11/2016 15:09:44 Shinsky, [name] ([id number]) Favaro, Daliana Elbert Ewings ([id number]) -------------------------------------------------------------------------------- Pain Assessment Details Patient Name: Frysinger, [name] Date of Service: 05/11/2016 2:15 PM Medical Record Patient Account Number: [id number] Number: Treating RN: Curtis Sites Nov 02, 1955 (60 y.o. Other Clinician: Date of Birth/Sex: Female) Treating ROBSON, MICHAEL Primary Care Physician: Sherrie Mustache Physician/Extender: G Referring Physician: Sherrie Mustache Weeks in Treatment: 20 Active Problems Location of Pain Severity and Description of Pain Patient Has Paino No Site Locations Pain Management and Medication Current Pain Management: Notes Topical or injectable lidocaine is offered to patient for acute pain when surgical debridement is performed. If needed, Patient is instructed to use over the  counter pain medication for the following 24-48 hours after debridement. Wound care MDs do not prescribed pain medications. Patient has chronic pain or uncontrolled pain. Patient has been  instructed to make an appointment with their Primary Care Physician for pain management. Electronic Signature(s) Signed: 05/11/2016 5:05:30 PM By: Curtis Sites Entered By: Curtis Sites on 05/11/2016 14:44:55 Lundahl, [name] ([id number]) -------------------------------------------------------------------------------- Patient/Caregiver Education Details Patient Name: Brodrick, Erisa L. Date of Service: 05/11/2016 2:15 PM Medical Record Patient Account Number: [id number] Number: Treating RN: Curtis Sites 1956/03/06 (60 y.o. Other Clinician: Date of Birth/Gender: Female) Treating ROBSON, MICHAEL Primary Care Physician: Sherrie Mustache Physician/Extender: G Referring Physician: Augustin Coupe in Treatment: 20 Education Assessment Education Provided To: Patient and Caregiver Education Topics Provided Medication Safety: Handouts: Other: need for PCP and bring meds next visit Methods: Explain/Verbal Responses: State content correctly Electronic Signature(s) Signed: 05/11/2016 5:05:30 PM By: Curtis Sites Entered By: Curtis Sites on 05/11/2016 16:59:58 Rickenbach, Delene Elbert Ewings ([id number]) -------------------------------------------------------------------------------- Wound Assessment Details Patient Name: Lazard, Lutisha L. Date of Service: 05/11/2016 2:15 PM Medical Record Patient Account Number: [id number] Number: Treating RN: Curtis Sites 04/29/1956 (60 y.o. Other Clinician: Date of Birth/Sex: Female) Treating ROBSON, MICHAEL Primary Care Physician: Sherrie Mustache Physician/Extender: G Referring Physician: Sherrie Mustache Weeks in Treatment: 20 Wound Status Wound Number: 1 Primary Etiology: Venous Leg Ulcer Wound Location: Right Lower Leg - Medial Wound Status: Open Wounding Event: Blister Date Acquired: 12/15/2015 Weeks Of Treatment: 20 Clustered Wound: No Photos Wound Measurements Length: (cm) 3.4 % Reduction in Are Width: (cm) 2.6 %  Reduction in Vol Depth: (cm) 0.1 Epithelialization: Area: (cm) 6.943 Tunneling: Volume: (cm) 0.694 Undermining: a: 73.2% ume: 73.2% Small (1-33%) No No Wound Description Classification: Partial Thickness Foul Odor After Cl Wound Margin: Flat and Intact Exudate Amount: Large Exudate Type: Serous Exudate Color: amber eansing: No Wound Bed Granulation Amount: Large (67-100%) Exposed Structure Granulation Quality: Red Fascia Exposed: No Necrotic Amount: None Present (0%) Fat Layer Exposed: No Albea, Katina L. ([id number]) Tendon Exposed: No Muscle Exposed: No Joint Exposed: No Bone Exposed: No Limited to Skin Breakdown Periwound Skin Texture Texture Color No Abnormalities Noted: No No Abnormalities Noted: No Callus: No Atrophie Blanche: No Crepitus: No Cyanosis: No Excoriation: No Ecchymosis: No Fluctuance: No Erythema: No Friable: No Hemosiderin Staining: No Induration: No Mottled: No Localized Edema: No Pallor: No Rash: No Rubor: No Scarring: No Temperature / Pain Moisture Temperature: No Abnormality No Abnormalities Noted: No Dry / Scaly: No Maceration: No Moist: Yes Wound Preparation Ulcer Cleansing: Other: soap and water, Topical Anesthetic Applied: Other: lidocaine 4%, Treatment Notes Wound #1 (Right, Medial Lower Leg) 1. Cleansed with: Cleanse wound with antibacterial soap and water 2. Anesthetic Topical Lidocaine 4% cream to wound bed prior to debridement 4. Dressing Applied: Aquacel Ag Other dressing (specify in notes) 5. Secondary Dressing Applied ABD Pad 7. Secured with 4 Layer Compression System - Bilateral Notes xtrasorb Psychologist, prison and probation services) Signed: 05/11/2016 5:05:30 PM By: Micah Noel, [name] ([id number]) Entered By: Curtis Sites on 05/11/2016 16:15:21 Vandiver, [name] ([id number]) -------------------------------------------------------------------------------- Wound Assessment Details Patient  Name: Farfan, Tamela L. Date of Service: 05/11/2016 2:15 PM Medical Record Patient Account Number: [id number] Number: Treating RN: Curtis Sites [birth year] (60 y.o. Other Clinician: Date of Birth/Sex: Female) Treating ROBSON, MICHAEL Primary Care Physician: Sherrie Mustache Physician/Extender: G Referring Physician: Sherrie Mustache Weeks in Treatment: 20 Wound Status Wound Number: 2 Primary Etiology: Lymphedema Wound Location: Left Lower Leg - Posterior Wound Status: Open Wounding Event: Gradually Appeared Date  Acquired: 04/19/2016 Weeks Of Treatment: 1 Clustered Wound: No Photos Wound Measurements Length: (cm) 2.1 Width: (cm) 1 Depth: (cm) 0.2 Area: (cm) 1.649 Volume: (cm) 0.33 % Reduction in Area: -94.5% % Reduction in Volume: -288.2% Epithelialization: Large (67-100%) Tunneling: No Undermining: No Wound Description Classification: Partial Thickness Wound Margin: Flat and Intact Exudate Amount: Medium Exudate Type: Serous Exudate Color: amber Wound Bed Granulation Amount: Small (1-33%) Exposed Structure Granulation Quality: Red Fascia Exposed: No Necrotic Amount: Small (1-33%) Fat Layer Exposed: No Stanly, Bayyinah L. ([id number]) Necrotic Quality: Adherent Slough Tendon Exposed: No Muscle Exposed: No Joint Exposed: No Bone Exposed: No Limited to Skin Breakdown Periwound Skin Texture Texture Color No Abnormalities Noted: No No Abnormalities Noted: No Callus: No Atrophie Blanche: No Crepitus: No Cyanosis: No Excoriation: No Ecchymosis: Yes Fluctuance: No Erythema: No Friable: No Hemosiderin Staining: Yes Induration: Yes Mottled: No Localized Edema: No Pallor: No Rash: No Rubor: No Scarring: No Temperature / Pain Moisture Temperature: No Abnormality No Abnormalities Noted: No Tenderness on Palpation: Yes Dry / Scaly: No Maceration: No Moist: Yes Wound Preparation Ulcer Cleansing: Other: soap and water, Topical Anesthetic  Applied: Other: lidocaine 4%, Treatment Notes Wound #2 (Left, Posterior Lower Leg) 1. Cleansed with: Cleanse wound with antibacterial soap and water 2. Anesthetic Topical Lidocaine 4% cream to wound bed prior to debridement 4. Dressing Applied: Aquacel Ag Other dressing (specify in notes) 5. Secondary Dressing Applied ABD Pad 7. Secured with 4 Layer Compression System - Bilateral Notes xtrasorb Psychologist, prison and probation services) Signed: 05/11/2016 5:05:30 PM By: Micah Noel, [name] ([id number]) Entered By: Curtis Sites on 05/11/2016 16:15:43 Broadwater, [name] ([id number]) -------------------------------------------------------------------------------- Vitals Details Patient Name: Thew, Bonetta L. Date of Service: 05/11/2016 2:15 PM Medical Record Patient Account Number: [id number] Number: Treating RN: Curtis Sites Aug 22, 1955 (60 y.o. Other Clinician: Date of Birth/Sex: Female) Treating ROBSON, MICHAEL Primary Care Physician: Sherrie Mustache Physician/Extender: G Referring Physician: Sherrie Mustache Weeks in Treatment: 20 Vital Signs Time Taken: 14:44 Temperature (F): 98.2 Height (in): 62 Pulse (bpm): 86 Weight (lbs): 170 Respiratory Rate (breaths/min): 18 Body Mass Index (BMI): 31.1 Blood Pressure (mmHg): 166/93 Reference Range: 80 - 120 mg / dl Electronic Signature(s) Signed: 05/11/2016 5:05:30 PM By: Curtis Sites Entered By: Curtis Sites on 05/11/2016 14:45:49

## 2016-05-18 ENCOUNTER — Encounter: Payer: Medicare Other | Attending: Physician Assistant | Admitting: Physician Assistant

## 2016-05-18 DIAGNOSIS — L97211 Non-pressure chronic ulcer of right calf limited to breakdown of skin: Secondary | ICD-10-CM | POA: Insufficient documentation

## 2016-05-18 DIAGNOSIS — M3210 Systemic lupus erythematosus, organ or system involvement unspecified: Secondary | ICD-10-CM | POA: Diagnosis not present

## 2016-05-18 DIAGNOSIS — I87331 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 lower extremity: Secondary | ICD-10-CM | POA: Insufficient documentation

## 2016-05-18 DIAGNOSIS — Z8673 Personal history of transient ischemic attack (TIA), and cerebral infarction without residual deficits: Secondary | ICD-10-CM | POA: Insufficient documentation

## 2016-05-18 DIAGNOSIS — Z7952 Long term (current) use of systemic steroids: Secondary | ICD-10-CM | POA: Diagnosis not present

## 2016-05-18 DIAGNOSIS — Z79899 Other long term (current) drug therapy: Secondary | ICD-10-CM | POA: Diagnosis not present

## 2016-05-18 DIAGNOSIS — R600 Localized edema: Secondary | ICD-10-CM | POA: Insufficient documentation

## 2016-05-18 DIAGNOSIS — I87322 Chronic venous hypertension (idiopathic) with inflammation of left lower extremity: Secondary | ICD-10-CM | POA: Insufficient documentation

## 2016-05-18 DIAGNOSIS — I10 Essential (primary) hypertension: Secondary | ICD-10-CM | POA: Insufficient documentation

## 2016-05-20 ENCOUNTER — Other Ambulatory Visit: Payer: Self-pay | Admitting: Gastroenterology

## 2016-05-20 DIAGNOSIS — R945 Abnormal results of liver function studies: Secondary | ICD-10-CM

## 2016-05-20 DIAGNOSIS — R7989 Other specified abnormal findings of blood chemistry: Secondary | ICD-10-CM

## 2016-05-20 DIAGNOSIS — Z8619 Personal history of other infectious and parasitic diseases: Secondary | ICD-10-CM

## 2016-05-20 NOTE — Progress Notes (Addendum)
[name] ([id number]) Visit Report for 05/18/2016 Chief Complaint Document Details Patient Name: [name], [name] Date of Service: 05/18/2016 1:30 PM Medical Record Number: [id number] Patient Account Number: [id number] Date of Birth/Sex: 01-13-1956 (60 y.o. Female) Treating RN: [name] Primary Care Physician: [name] Other Clinician: Referring Physician: Sherrie [name] Treating Physician/Extender: [name], [name] in Treatment: 21 Information Obtained from: Patient Chief Complaint Patient is here for follow-up regarding her bilateral lower extremity venous stasis ulcers Electronic Signature(s) Signed: 05/20/2016 1:37:32 AM By: [name] Kelp PA-C Entered By: [name] on 05/18/2016 14:53:19 [name], [name] ([id number]) -------------------------------------------------------------------------------- HPI Details Patient Name: [name], [name] Date of Service: 05/18/2016 1:30 PM Medical Record Number: [id number] Patient Account Number: [id number] Date of Birth/Sex: 09/25/1955 (60 y.o. Female) Treating RN: [name] Primary Care Physician: [name] Other Clinician: Referring Physician: Sherrie [name] Treating Physician/Extender: [name], [name] in Treatment: 21 History of Present Illness HPI Description: 12/23/15; this is a 60 year old lady who recently relocated back to [place] from [place]. She is staying with a family friend previously lived with a daughter in [place]. 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 She is on chronic prednisone she is not a diabetic. Dose of prednisone is 10 mg. The patient tells me that roughly a week ago she developed a fairly substantial blister over this area that then ruptured. She was seen in the emergency room on 5/7 an x-ray of the leg was negative she was put on Septra although I don't think any cultures were done. She also  tells me she has had chronic edema in her legs for a number of years. She had a similar presentation to currently in the right lateral lower leg roughly a year ago that she was able to heal on her own. As noted she has systemic lupus but does not have lupus nephritis according to the patient. She has a lot of edema in her lower legs. The ABI's could not be obtained. 12/31/15; the patient's insurance which is [place] makes her out of network for any home health therefore we have been changing her dressing in our facility. I reviewed her trip to the ER on 12/21/2015 her creatinine was within the normal range hemoglobin and white count normal. Culture of the wound was negative. X-ray of the leg showed soft tissue edema no foreign bodies. We have been dressing this with Aquacel Ag due to the amount of ongoing drainage 01/07/16; the patient had her arterial studies this morning I don't have these results area she comes back in to have [place] rewrap since she doesn't have access to home health 01/14/16 I still do not have the results of her arterial studies. Our nurse correctly pointed out that we've been wrapping the left leg without an open wound I think this had to do with the fact that she had so much edema when she came in I was concerned about leaving this unwrapped. 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 We left her left leg unwrapped last week as she has no open wound here today she has extensive edema here. We did order stockings however I believe the company called but they have not called them back. She comes back in today with extensive edema in the left leg. We have her  arterial studies which showed triphasic waves and all locations tested including her posterior tibial and anterior tibial arteries bilaterally. Report listed no hemodynamically significant  bilateral lower extremity arterial stenosis. She has her venous studies later this month. I'm going to rewrap the left leg due to the extent of the edema, transitioning her into the stocking we ordered last week as soon as one is available 01/27/16 the patient arrived today with pooled serosanguineous drainage over the wound it would not absorb into her Aquacel Ag which is somewhat on. In spite of this the dimensions of her wound are down considerably and the wound bed looks healthy without any need for debridement. She is obtained her juxtalite stockings the left leg which has no open wound. We are waiting for the right leg wound to get smaller before ordering her one for the right leg 02/03/16; patient wound looked improved. No debridement was required. We've been using the juxtalite [name], [name] ([id number]) stocking on the left leg which is no open wound. 02/11/16 wound is remarkably better. No debridement was required. Healthy rim of epithelialization. We have been using Aquacel Ag, we'll try to close this down with RTD over the next 2 [name] 02/18/16 wound continues to improve down a centimeter in length and width. No debridement was required. Advancing epithelialization. We started RTD last week 02/25/16: pt brought juxtalite for right lower leg today. nurse reports RTD was dry and adhered to the wound. required removal by NS. wound bed with 100% healthy red granulation tissue. denies systemic s/s of infection. 03/02/16; Considerable improvement. no debridement. 03/09/16; wound bed is very vascular no major change in dimensions. Continuing with RTD 03/16/16; unfortunately no major change in dimensions however the wound bed looks very healthy. I will continue with RTD for one more week. If his stalls consider change to collagen. 03/30/2016 -- she's been using juxta lites on her left lower extremity but her lymphedema is very significantly increased and she is not tolerating the juxta lites  the edema has increased symptoms significantly 04/06/16; apparently the RTD started sticking to the wound therefore she was changed to sorbact. Her 4-layer wrap appears to have slipped one third of the way down her leg. She was tolerating the juxtalite on the left leg, I don't see the need to wrap the left leg. She is not a candidate for external compression pumps secondary to insurance issues. 04/13/16; wound is actually measuring larger. Has been using Sorbact 04/20/16; although the wound dimensions of really not changed all that much, the better this certainly looks healthy and there is advancing epithelialization from both sides. No evidence of infection 04/27/16 changed to [facility] last week and the dimensions appear to be a lot better. No evidence of infection. She does not put the juxtalite stocking on the left leg on correctly nevertheless she has never had an open wound here. I am really uncertain whether we ever got 2 to juxtalite stockings for her. The wound started as a blister 05/04/16; finally with Hydrofera Blue last week the area on the right leg has closed over. However in inspection of her left leg she now has a small wound on the posterior aspect of the left leg. She has been using juxta lites on this area but probably not really applying them correctly and with enough tension. She is not a candidate for compression pumps secondary to insurance issues 05/11/16; the area on her right anterior leg and closed over last week however we noted an area  on her left posterior calf. I elected to wrap both her legs in anticipation we be able to transition her to juxtalite stockings. She arrives today the wraps and fallen down on both sides. The area on the right anterior leg has reopened she also had a very tense blister on the right anterior lateral leg just above her wound which I elected to excise. The area on the posterior left leg is roughly about the same 05/18/16 patient presents  today for a follow-up concerning her right and left lower extremity venous stasis ulcers. Currently her wraps which are 4-layer compression have been sliding down due to the amount of swelling that she has. This is causing stasis above the wraps were really does not need to be and subsequently leading to her having blistering especially on the left above where the wrap is. In fact she has a large wound in this area that was just blistered last week and was not nearly as bad as what it is right now. Unfortunately despite what we have tried with the compression at this point I feel that we are almost being counterproductive in this respect. She tells me that her pain as a 9 out of 10 at this point in time. this pain is worse with manipulation of the wound including cleaning as well as just touching. Electronic Signature(s) Signed: 05/20/2016 1:37:32 AM By: [name] Kelp PA-C Entered By: [name] on 05/18/2016 14:55:56 Fishel, [name] ([id number]) Fate, [name] ([id number]) -------------------------------------------------------------------------------- Physical Exam Details Patient Name: Riner, Anjulie L. Date of Service: 05/18/2016 1:30 PM Medical Record Number: [id number] Patient Account Number: [id number] Date of Birth/Sex: [birth year] (60 y.o. Female) Treating RN: [name] Primary Care Physician: [name] Other Clinician: Referring Physician: Sherrie [name] Treating Physician/Extender: [name], [name] in Treatment: 21 Constitutional Well-nourished and well-hydrated in no acute distress. Respiratory normal breathing without difficulty. clear to auscultation bilaterally. Cardiovascular regular rate and rhythm with normal S1, S2. bilateral lower extremity 2+ pitting edema noted. She has bilateral lymphedema. Psychiatric this patient is able to make decisions and demonstrates good insight into disease process. Alert and Oriented x 3. pleasant and  cooperative. Electronic Signature(s) Signed: 05/20/2016 1:37:32 AM By: [name] Kelp PA-C Entered By: [name] on 05/18/2016 14:56:33 Fick, [name] ([id number]) -------------------------------------------------------------------------------- Physician Orders Details Patient Name: [name], [name] Date of Service: 05/18/2016 1:30 PM Medical Record Number: [id number] Patient Account Number: [id number] Date of Birth/Sex: May 01, 1956 (60 y.o. Female) Treating RN: [name] Primary Care Physician: [name] Other Clinician: Referring Physician: Sherrie [name] Treating Physician/Extender: Skeet Simmer in Treatment: 21 Verbal / Phone Orders: Yes Clinician: Curtis [name] Read Back and Verified: Yes Diagnosis Coding ICD-10 Coding Code Description L97.211 Non-pressure chronic ulcer of right calf limited to breakdown of skin 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 lower I87.331 extremity I87.322 Chronic venous hypertension (idiopathic) with inflammation of left lower extremity M32.10 Systemic lupus erythematosus, organ or system involvement unspecified Wound Cleansing Wound #1 Right,Medial Lower Leg o Cleanse wound with mild soap and water Wound #2 Left,Posterior Lower Leg o Cleanse wound with mild soap and water Wound #3 Right,Proximal,Medial Lower Leg o Cleanse wound with mild soap and water Skin Barriers/Peri-Wound Care Wound #1 Right,Medial Lower Leg o Moisturizing lotion Wound #2 Left,Posterior Lower Leg o Moisturizing lotion Wound #3 Right,Proximal,Medial Lower Leg o Moisturizing lotion Primary Wound Dressing Wound #1 Right,Medial Lower Leg o Aquacel Ag Wound #2 Left,Posterior Lower Leg o Aquacel Ag Wound #  3 Right,Proximal,Medial Lower Leg Maiorana, Kajsa L. ([id number]) o Aquacel Ag Secondary Dressing Wound #1 Right,Medial Lower Leg o ABD pad - xtrasorb if needed for  drainage Wound #2 Left,Posterior Lower Leg o ABD pad - xtrasorb if needed for drainage Wound #3 Right,Proximal,Medial Lower Leg o ABD pad - xtrasorb if needed for drainage Dressing Change Frequency Wound #1 Right,Medial Lower Leg o Other: - [facility] Wound #2 Left,Posterior Lower Leg o Other: - [facility] Wound #3 Right,Proximal,Medial Lower Leg o Other: - [facility] Wound #1 Right,Medial Lower Leg o Return Appointment in 1 week. Wound #2 Left,Posterior Lower Leg o Return Appointment in 1 week. Wound #3 Right,Proximal,Medial Lower Leg o Return Appointment in 1 week. Edema Control Wound #1 Right,Medial Lower Leg o Other: - kerlix and coban from toes to 3cm below the knee Wound #2 Left,Posterior Lower Leg o Other: - kerlix and coban from toes to 3cm below the knee Wound #3 Right,Proximal,Medial Lower Leg o Other: - kerlix and coban from toes to 3cm below the knee Additional Orders / Instructions Wound #1 Right,Medial Lower Leg o Increase protein intake. Wound #2 Left,Posterior Lower Leg [name], [name] ([id number]) o Increase protein intake. Wound #3 Right,Proximal,Medial Lower Leg o Increase protein intake. Home Health Wound #1 Right,Medial Lower Leg o Initiate Home Health for Skilled Nursing o Home Health Nurse may visit PRN to address patientos wound care needs. o FACE TO FACE ENCOUNTER: MEDICARE and MEDICAID PATIENTS: I certify that this patient is under my care and that I had a face-to-face encounter that meets the physician face-to-face encounter requirements with this patient on this date. The encounter with the patient was in whole or in part for the following MEDICAL CONDITION: (primary reason for Home Healthcare) MEDICAL NECESSITY: I certify,  that based on my findings, NURSING services are a medically necessary home health service. HOME BOUND STATUS: I certify that my clinical findings support that this patient is homebound (i.e., Due to illness or injury, pt requires aid of supportive devices such as crutches, cane, wheelchairs, walkers, the use of special transportation or the assistance of another person to leave their place of residence. There is a normal inability to leave the home and doing so requires considerable and taxing effort. Other absences are for medical reasons / religious services and are infrequent or of short duration when for other reasons). o If current dressing causes regression in wound condition, may D/C ordered dressing product/s and apply Normal Saline Moist Dressing daily until next Wound Healing Center / Other MD appointment. Notify Wound Healing Center of regression in wound condition at [phone]. o Please direct any NON-WOUND related issues/requests for orders to patient's Primary Care Physician Wound #2 Left,Posterior Lower Leg o Initiate Home Health for Skilled Nursing o Home Health Nurse may visit PRN to address patientos wound care needs. o FACE TO FACE ENCOUNTER: MEDICARE and MEDICAID PATIENTS: I certify that this patient is under my care and that I had a face-to-face encounter that meets the physician face-to-face encounter requirements with this patient on this date. The encounter with the patient was in whole or in part for the following MEDICAL CONDITION: (primary reason for Home Healthcare) MEDICAL NECESSITY: I certify, that based on my findings, NURSING services are a medically necessary home health service. HOME BOUND STATUS:  I certify that my clinical findings support that this patient is homebound (i.e., Due to illness or injury, pt requires aid of supportive devices such as crutches, cane, wheelchairs, walkers, the use of special transportation or the assistance of  another person to leave their place of residence. There is a normal inability to leave the home and doing so requires considerable and taxing effort. Other absences are for medical reasons / religious services and are infrequent or of short duration when for other reasons). o If current dressing causes regression in wound condition, may D/C ordered dressing product/s and apply Normal Saline Moist Dressing daily until next Wound Healing Center / Other MD appointment. Notify Wound Healing Center of regression in wound condition at [phone]. o Please direct any NON-WOUND related issues/requests for orders to patient's Primary Care Physician Wound #3 Right,Proximal,Medial Lower Leg o [facility] for Skilled Nursing GIULLIANA, MCROBERTS ([id number]) o Home Health Nurse may visit PRN to address patientos wound care needs. o FACE TO FACE ENCOUNTER: MEDICARE and MEDICAID PATIENTS: I certify that this patient is under my care and that I had a face-to-face encounter that meets the physician face-to-face encounter requirements with this patient on this date. The encounter with the patient was in whole or in part for the following MEDICAL CONDITION: (primary reason for Home Healthcare) MEDICAL NECESSITY: I certify, that based on my findings, NURSING services are a medically necessary home health service. HOME BOUND STATUS: I certify that my clinical findings support that this patient is homebound (i.e., Due to illness or injury, pt requires aid of supportive devices such as crutches, cane, wheelchairs, walkers, the use of special transportation or the assistance of another person to leave their place of residence. There is a normal inability to leave the home and doing so requires considerable and taxing effort. Other absences are for medical reasons / religious services and are infrequent or of short duration when for other reasons). o If current dressing causes regression in  wound condition, may D/C ordered dressing product/s and apply Normal Saline Moist Dressing daily until next Wound Healing Center / Other MD appointment. Notify Wound Healing Center of regression in wound condition at [phone]. o Please direct any NON-WOUND related issues/requests for orders to patient's Primary Care Physician Electronic Signature(s) Signed: 05/18/2016 5:47:08 PM By: [name] Signed: 05/20/2016 1:37:32 AM By: [name] Kelp PA-C Entered By: [name] on 05/18/2016 14:51:38 [name], [name] ([id number]) -------------------------------------------------------------------------------- Problem List Details Patient Name: Fawbush, Tayler L. Date of Service: 05/18/2016 1:30 PM Medical Record Number: [id number] Patient Account Number: [id number] Date of Birth/Sex: 1956/05/09 (60 y.o. Female) Treating RN: [name] Primary Care Physician: [name] Other Clinician: Referring Physician: Sherrie [name] Treating Physician/Extender: Skeet Simmer in Treatment: 21 Active Problems ICD-10 Encounter Code Description Active Date Diagnosis L97.211 Non-pressure chronic ulcer of right calf limited to 03/02/2016 Yes breakdown of skin I87.331 Chronic venous hypertension (idiopathic) with ulcer and 12/23/2015 Yes inflammation of right lower extremity I87.322 Chronic venous hypertension (idiopathic) with 12/23/2015 Yes inflammation of left lower extremity M32.10 Systemic lupus erythematosus, organ or system 12/23/2015 Yes involvement unspecified Inactive Problems Resolved Problems Electronic Signature(s) Signed: 05/20/2016 1:37:32 AM By: [name] Kelp PA-C Entered By: [name] on 05/18/2016 14:52:50 Dierks, [name] ([id number]) -------------------------------------------------------------------------------- Progress Note Details Patient Name: Chance, Britaney L. Date of Service: 05/18/2016 1:30 PM Medical Record Number: [id number] Patient  Account Number: [id number] Date of Birth/Sex: [birth year] (60 y.o. Female) Treating RN: [name] Primary Care Physician: Dario Guardian,  [facility] Other Clinician: Referring Physician: Sherrie [name] Treating Physician/Extender: [name], [name] in Treatment: 21 Subjective Chief Complaint Information obtained from Patient Patient is here for follow-up regarding her bilateral lower extremity venous stasis ulcers History of Present Illness (HPI) 12/23/15; this is a 60 year old lady who recently relocated back to [place] from [place]. She is staying with a family friend previously lived with a daughter in [place]. 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 She is on chronic prednisone she is not a diabetic. Dose of prednisone is 10 mg. The patient tells me that roughly a week ago she developed a fairly substantial blister over this area that then ruptured. She was seen in the emergency room on 5/7 an x-ray of the leg was negative she was put on Septra although I don't think any cultures were done. She also tells me she has had chronic edema in her legs for a number of years. She had a similar presentation to currently in the right lateral lower leg roughly a year ago that she was able to heal on her own. As noted she has systemic lupus but does not have lupus nephritis according to the patient. She has a lot of edema in her lower legs. The ABI's could not be obtained. 12/31/15; the patient's insurance which is [place] [employer] makes her out of network for any home health therefore we have been changing her dressing in our facility. I reviewed her trip to the ER on 12/21/2015 her creatinine was within the normal range hemoglobin and white count normal. Culture of the wound was negative. X-ray of the leg showed soft tissue edema no foreign bodies. We have been dressing this with Aquacel Ag due to the amount of ongoing drainage 01/07/16;  the patient had her arterial studies this morning I don't have these results area she comes back in to have [place] rewrap since she doesn't have access to home health 01/14/16 I still do not have the results of her arterial studies. Our nurse correctly pointed out that we've been wrapping the left leg without an open wound I think this had to do with the fact that she had so much edema when she came in I was concerned about leaving this unwrapped. 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 We left her left leg unwrapped last week as she has no open wound here today she has extensive edema here. We did order stockings however I believe the company called but they have not called them back. She comes back in today with extensive edema in the left leg. We have her arterial studies which showed triphasic waves and all locations tested including her posterior tibial and anterior tibial arteries bilaterally. Report listed no hemodynamically significant bilateral lower extremity arterial stenosis. She has her venous studies later this month. I'm going to rewrap the left leg due to the extent of the edema, transitioning her into the stocking we ordered last week as soon as one is available KALISKA, VINCE. ([id number]) 01/27/16 the patient arrived today with pooled serosanguineous drainage over the wound it would not absorb into her Aquacel Ag which is somewhat on. In spite of this the dimensions of her wound are down considerably and the wound bed looks healthy without any need for debridement. She is obtained her juxtalite  stockings the left leg which has no open wound. We are waiting for the right leg wound to get smaller before ordering her one for the right leg 02/03/16; patient wound looked improved. No debridement was required. We've been using the juxtalite stocking on the  left leg which is no open wound. 02/11/16 wound is remarkably better. No debridement was required. Healthy rim of epithelialization. We have been using Aquacel Ag, we'll try to close this down with RTD over the next 2 [name] 02/18/16 wound continues to improve down a centimeter in length and width. No debridement was required. Advancing epithelialization. We started RTD last week 02/25/16: pt brought juxtalite for right lower leg today. nurse reports RTD was dry and adhered to the wound. required removal by NS. wound bed with 100% healthy red granulation tissue. denies systemic s/s of infection. 03/02/16; Considerable improvement. no debridement. 03/09/16; wound bed is very vascular no major change in dimensions. Continuing with RTD 03/16/16; unfortunately no major change in dimensions however the wound bed looks very healthy. I will continue with RTD for one more week. If his stalls consider change to collagen. 03/30/2016 -- she's been using juxta lites on her left lower extremity but her lymphedema is very significantly increased and she is not tolerating the juxta lites the edema has increased symptoms significantly 04/06/16; apparently the RTD started sticking to the wound therefore she was changed to sorbact. Her 4-layer wrap appears to have slipped one third of the way down her leg. She was tolerating the juxtalite on the left leg, I don't see the need to wrap the left leg. She is not a candidate for external compression pumps secondary to insurance issues. 04/13/16; wound is actually measuring larger. Has been using Sorbact 04/20/16; although the wound dimensions of really not changed all that much, the better this certainly looks healthy and there is advancing epithelialization from both sides. No evidence of infection 04/27/16 changed to [facility] last week and the dimensions appear to be a lot better. No evidence of infection. She does not put the juxtalite stocking on the left leg on  correctly nevertheless she has never had an open wound here. I am really uncertain whether we ever got 2 to juxtalite stockings for her. The wound started as a blister 05/04/16; finally with Hydrofera Blue last week the area on the right leg has closed over. However in inspection of her left leg she now has a small wound on the posterior aspect of the left leg. She has been using juxta lites on this area but probably not really applying them correctly and with enough tension. She is not a candidate for compression pumps secondary to insurance issues 05/11/16; the area on her right anterior leg and closed over last week however we noted an area on her left posterior calf. I elected to wrap both her legs in anticipation we be able to transition her to juxtalite stockings. She arrives today the wraps and fallen down on both sides. The area on the right anterior leg has reopened she also had a very tense blister on the right anterior lateral leg just above her wound which I elected to excise. The area on the posterior left leg is roughly about the same 05/18/16 patient presents today for a follow-up concerning her right and left lower extremity venous stasis ulcers. Currently her wraps which are 4-layer compression have been sliding down due to the amount of swelling that she has. This is causing stasis above the wraps were  really does not need to be and subsequently leading to her having blistering especially on the left above where the wrap is. In fact she has a large wound in this area that was just blistered last week and was not nearly as bad as what it is right now. Unfortunately despite what we have tried with the compression at this point I feel that we are almost being counterproductive in this respect. She tells me that her pain as a 9 out of 10 at this point in time. this pain is worse with manipulation of the wound including cleaning as well as just touching. [name], [name]  ([id number]) Objective Constitutional Well-nourished and well-hydrated in no acute distress. Vitals Time Taken: 2:20 PM, Height: 62 in, Weight: 170 lbs, BMI: 31.1, Temperature: 98.1 F, Pulse: 90 bpm, Respiratory Rate: 18 breaths/min, Blood Pressure: 181/97 mmHg. Respiratory normal breathing without difficulty. clear to auscultation bilaterally. Cardiovascular regular rate and rhythm with normal S1, S2. bilateral lower extremity 2+ pitting edema noted. She has bilateral lymphedema. Psychiatric this patient is able to make decisions and demonstrates good insight into disease process. Alert and Oriented x 3. pleasant and cooperative. Integumentary (Hair, Skin) Wound #1 status is Open. Original cause of wound was Blister. The wound is located on the Right,Medial Lower Leg. The wound measures 3.6cm length x 2.2cm width x 0.1cm depth; 6.22cm^2 area and 0.622cm^3 volume. The wound is limited to skin breakdown. There is no tunneling or undermining noted. There is a large amount of serous drainage noted. The wound margin is flat and intact. There is large (67- 100%) red granulation within the wound bed. There is no necrotic tissue within the wound bed. The periwound skin appearance exhibited: Moist. The periwound skin appearance did not exhibit: Callus, Crepitus, Excoriation, Fluctuance, Friable, Induration, Localized Edema, Rash, Scarring, Dry/Scaly, Maceration, Atrophie Blanche, Cyanosis, Ecchymosis, Hemosiderin Staining, Mottled, Pallor, Rubor, Erythema. Periwound temperature was noted as No Abnormality. Wound #2 status is Open. Original cause of wound was Gradually Appeared. The wound is located on the Left,Posterior Lower Leg. The wound measures 1.7cm length x 1.4cm width x 0.2cm depth; 1.869cm^2 area and 0.374cm^3 volume. The wound is limited to skin breakdown. There is no tunneling or undermining noted. There is a medium amount of serous drainage noted. The wound margin is flat and intact.  There is small (1-33%) red granulation within the wound bed. There is a small (1-33%) amount of necrotic tissue within the wound bed including Adherent Slough. The periwound skin appearance exhibited: Induration, Moist, Ecchymosis, Hemosiderin Staining. The periwound skin appearance did not exhibit: Callus, Crepitus, Excoriation, Fluctuance, Friable, Localized Edema, Rash, Scarring, Dry/Scaly, Maceration, Atrophie Blanche, Cyanosis, Mottled, Pallor, Rubor, Erythema. Periwound temperature was noted as No Abnormality. The periwound has tenderness on palpation. [name], [name] ([id number]) Wound #3 status is Open. Original cause of wound was Blister. The wound is located on the Right,Proximal,Medial Lower Leg. The wound measures 4.4cm length x 6cm width x 0.2cm depth; 20.735cm^2 area and 4.147cm^3 volume. The wound is limited to skin breakdown. There is no tunneling or undermining noted. There is a large amount of sanguinous drainage noted. The wound margin is flat and intact. There is large (67-100%) red, pink granulation within the wound bed. There is a small (1-33%) amount of necrotic tissue within the wound bed including Adherent Slough. The periwound skin appearance did not exhibit: Callus, Crepitus, Excoriation, Fluctuance, Friable, Induration, Localized Edema, Rash, Scarring, Dry/Scaly, Maceration, Moist, Atrophie Blanche, Cyanosis, Ecchymosis, Hemosiderin Staining, Mottled, Pallor, Rubor, Erythema. The  periwound has tenderness on palpation. Assessment Active Problems ICD-10 L97.211 - Non-pressure chronic ulcer of right calf limited to breakdown of skin I87.331 - 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 lower extremity I87.322 - Chronic venous hypertension (idiopathic) with inflammation of left lower extremity M32.10 - Systemic lupus erythematosus, organ or system involvement unspecified Diagnoses ICD-10 L97.211: Non-pressure chronic ulcer of  right calf limited to breakdown of skin I87.331: 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 lower extremity I87.322: Chronic venous hypertension (idiopathic) with inflammation of left lower extremity M32.10: Systemic lupus erythematosus, organ or system involvement unspecified Plan Wound Cleansing: Wound #1 Right,Medial Lower Leg: Cleanse wound with mild soap and water Wound #2 Left,Posterior Lower Leg: Cleanse wound with mild soap and water Wound #3 Right,Proximal,Medial Lower Leg: Cleanse wound with mild soap and water Skin Barriers/Peri-Wound Care: Wound #1 Right,Medial Lower Leg: Moisturizing lotion Wound #2 Left,Posterior Lower Leg: Moisturizing lotion Wound #3 Right,Proximal,Medial Lower Leg: [name], [name] ([id number]) Moisturizing lotion Primary Wound Dressing: Wound #1 Right,Medial Lower Leg: Aquacel Ag Wound #2 Left,Posterior Lower Leg: Aquacel Ag Wound #3 Right,Proximal,Medial Lower Leg: Aquacel Ag Secondary Dressing: Wound #1 Right,Medial Lower Leg: ABD pad - xtrasorb if needed for drainage Wound #2 Left,Posterior Lower Leg: ABD pad - xtrasorb if needed for drainage Wound #3 Right,Proximal,Medial Lower Leg: ABD pad - xtrasorb if needed for drainage Dressing Change Frequency: Wound #1 Right,Medial Lower Leg: Other: - twice weekly - once at [facility] and once by [facility] Wound #2 Left,Posterior Lower Leg: Other: - twice weekly - once at [facility] and once by [facility] Wound #3 Right,Proximal,Medial Lower Leg: Other: - twice weekly - once at [facility] and once by [facility] Follow-up Appointments: Wound #1 Right,Medial Lower Leg: Return Appointment in 1 week. Wound #2 Left,Posterior Lower Leg: Return Appointment in 1 week. Wound #3 Right,Proximal,Medial Lower Leg: Return Appointment in 1 week. Edema Control: Wound #1 Right,Medial Lower Leg: Other: - kerlix and coban from toes to 3cm  below the knee Wound #2 Left,Posterior Lower Leg: Other: - kerlix and coban from toes to 3cm below the knee Wound #3 Right,Proximal,Medial Lower Leg: Other: - kerlix and coban from toes to 3cm below the knee Additional Orders / Instructions: Wound #1 Right,Medial Lower Leg: Increase protein intake. Wound #2 Left,Posterior Lower Leg: Increase protein intake. Wound #3 Right,Proximal,Medial Lower Leg: Increase protein intake. Home Health: Wound #1 Right,Medial Lower Leg: Initiate Home Health for Skilled Nursing Home Health Nurse may visit PRN to address patient s wound care needs. FACE TO FACE ENCOUNTER: MEDICARE and MEDICAID PATIENTS: I certify that this patient is under my care and that I had a face-to-face encounter that meets the physician face-to-face encounter requirements with this patient on this date. The encounter with the patient was in whole or in part for the following MEDICAL CONDITION: (primary reason for Home Healthcare) MEDICAL NECESSITY: I certify, AMIE, COWENS ([id number]) that based on my findings, NURSING services are a medically necessary home health service. HOME BOUND STATUS: I certify that my clinical findings support that this patient is homebound (i.e., Due to illness or injury, pt requires aid of supportive devices such as crutches, cane, wheelchairs, walkers, the use of special transportation or the assistance of another person to leave their place of residence. There is a normal inability to leave the home and doing so requires considerable and taxing effort. Other absences are for medical reasons / religious services and are infrequent  or of short duration when for other reasons). If current dressing causes regression in wound condition, may D/C ordered dressing product/s and apply Normal Saline Moist Dressing daily until next Wound Healing Center / Other MD appointment. Notify Wound Healing Center of regression in wound condition at  [phone]. Please direct any NON-WOUND related issues/requests for orders to patient's Primary Care Physician Wound #2 Left,Posterior Lower Leg: Initiate Home Health for Skilled Nursing Home Health Nurse may visit PRN to address patient s wound care needs. FACE TO FACE ENCOUNTER: MEDICARE and MEDICAID PATIENTS: I certify that this patient is under my care and that I had a face-to-face encounter that meets the physician face-to-face encounter requirements with this patient on this date. The encounter with the patient was in whole or in part for the following MEDICAL CONDITION: (primary reason for Home Healthcare) MEDICAL NECESSITY: I certify, that based on my findings, NURSING services are a medically necessary home health service. HOME BOUND STATUS: I certify that my clinical findings support that this patient is homebound (i.e., Due to illness or injury, pt requires aid of supportive devices such as crutches, cane, wheelchairs, walkers, the use of special transportation or the assistance of another person to leave their place of residence. There is a normal inability to leave the home and doing so requires considerable and taxing effort. Other absences are for medical reasons / religious services and are infrequent or of short duration when for other reasons). If current dressing causes regression in wound condition, may D/C ordered dressing product/s and apply Normal Saline Moist Dressing daily until next Wound Healing Center / Other MD appointment. Notify Wound Healing Center of regression in wound condition at [phone]. Please direct any NON-WOUND related issues/requests for orders to patient's Primary Care Physician Wound #3 Right,Proximal,Medial Lower Leg: Initiate Home Health for Skilled Nursing Home Health Nurse may visit PRN to address patient s wound care needs. FACE TO FACE ENCOUNTER: MEDICARE and MEDICAID PATIENTS: I certify that this patient is under my care and that I had  a face-to-face encounter that meets the physician face-to-face encounter requirements with this patient on this date. The encounter with the patient was in whole or in part for the following MEDICAL CONDITION: (primary reason for Home Healthcare) MEDICAL NECESSITY: I certify, that based on my findings, NURSING services are a medically necessary home health service. HOME BOUND STATUS: I certify that my clinical findings support that this patient is homebound (i.e., Due to illness or injury, pt requires aid of supportive devices such as crutches, cane, wheelchairs, walkers, the use of special transportation or the assistance of another person to leave their place of residence. There is a normal inability to leave the home and doing so requires considerable and taxing effort. Other absences are for medical reasons / religious services and are infrequent or of short duration when for other reasons). If current dressing causes regression in wound condition, may D/C ordered dressing product/s and apply Normal Saline Moist Dressing daily until next Wound Healing Center / Other MD appointment. Notify Wound Healing Center of regression in wound condition at [phone]. Please direct any NON-WOUND related issues/requests for orders to patient's Primary Care Physician Follow-Up Appointments: A follow-up appointment should be scheduled. A Patient Clinical Summary of Care was provided to [facility], [name] ([id number]) At this point in time and actually alter her compression therapy at this point. I'm going to change her to a 2 layer compression hopefully this will still provide immediate relief of the fluid retention  along with keeping the wraps in place. Patient is in agreement with this plan. We will continue with the Aquacel Ag dressings to all 3 wound areas. I will see her back for follow-up in one week to see where things stand at that point in time. If she has any concerns or questions in the  interim she will contact the office and let us know otherwise it was a pleasure seeing this patient today and all questions and concerns were answered to the best my ability at this point. Electronic Signature(s) Signed: 06/09/2016 4:35:56 PM By: [name] Kelp PA-C Previous Signature: 05/20/2016 1:37:32 AM Version By: [name] Kelp PA-C Entered By: [name] on 06/09/2016 16:35:56 [name], [name] ([id number]) -------------------------------------------------------------------------------- SuperBill Details Patient Name: Meddaugh, [name] Date of Service: 05/18/2016 Medical Record Number: [id number] Patient Account Number: [id number] Date of Birth/Sex: [birth year] (60 y.o. Female) Treating RN: [name] Primary Care Physician: [name] Other Clinician: Referring Physician: Sherrie [name] Treating Physician/Extender: [name], [name] in Treatment: 21 Diagnosis Coding ICD-10 Codes Code Description L97.211 Non-pressure chronic ulcer of right calf limited to breakdown of skin 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 lower I87.331 extremity I87.322 Chronic venous hypertension (idiopathic) with inflammation of left lower extremity M32.10 Systemic lupus erythematosus, organ or system involvement unspecified Facility Procedures CPT4 Code: [id number] Description: 64403 - WOUND CARE VISIT-LEV 5 EST PT Modifier: Quantity: 1 Physician Procedures CPT4: Description Modifier Quantity Code [id number] - WC PHYS LEVEL 4 - EST PT 1 ICD-10 Description Diagnosis L97.211 Non-pressure chronic ulcer of right calf limited to breakdown of skin I87.331 Chronic venous hypertension (idiopathic) with ulcer and  inflammation of right lower extremity I87.322 Chronic venous hypertension (idiopathic) with inflammation of left lower extremity M32.10 Systemic lupus erythematosus, organ or system involvement unspecified Electronic Signature(s) Signed:  05/18/2016 5:11:34 PM By: [name] Signed: 05/20/2016 1:37:32 AM By: [name] Kelp PA-C Entered By: [name] on 05/18/2016 17:11:33

## 2016-05-21 DIAGNOSIS — I87331 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 lower extremity: Secondary | ICD-10-CM | POA: Diagnosis not present

## 2016-05-21 NOTE — Progress Notes (Signed)
[name], [name] ([id number]) Visit Report for 05/18/2016 Arrival Information Details Patient Name: [name], [name] Date of Service: 05/18/2016 1:30 PM Medical Record Number: [id number] Patient Account Number: [id number] Date of Birth/Sex: 1956/03/14 (60 y.o. Female) Treating RN: Huel Coventry Primary Care Physician: Sherrie Mustache Other Clinician: Referring Physician: Sherrie Mustache Treating Physician/Extender: Linwood Dibbles, HOYT Weeks in Treatment: 21 Visit Information History Since Last Visit Added or deleted any medications: No Patient Arrived: Ambulatory Any new allergies or adverse reactions: No Arrival Time: 14:19 Had a fall or experienced change in No Accompanied By: son in activities of daily living that may affect lobby risk of falls: Transfer Assistance: Manual Signs or symptoms of abuse/neglect since last No Patient Identification Verified: Yes visito Secondary Verification Process Yes Hospitalized since last visit: No Completed: Has Dressing in Place as Prescribed: Yes Patient Requires Transmission-Based No Pain Present Now: No Precautions: Patient Has Alerts: No Electronic Signature(s) Signed: 05/20/2016 4:28:52 PM By: Elliot Gurney, RN, [name], [name], [name] Entered By: Elliot Gurney, RN, [name], Kim on 05/18/2016 14:20:08 Stebbins, [name] ([id number]) -------------------------------------------------------------------------------- Clinic Level of Care Assessment Details Patient Name: [name], [name] Date of Service: 05/18/2016 1:30 PM Medical Record Number: [id number] Patient Account Number: [id number] Date of Birth/Sex: Nov 01, 1955 (60 y.o. Female) Treating RN: Curtis Sites Primary Care Physician: Sherrie Mustache Other Clinician: Referring Physician: Sherrie Mustache Treating Physician/Extender: Linwood Dibbles, HOYT Weeks in Treatment: 21 Clinic Level of Care Assessment Items TOOL 4 Quantity Score []  - Use when only an EandM is performed on FOLLOW-UP visit 0 ASSESSMENTS - Nursing  Assessment / Reassessment X - Reassessment of Co-morbidities (includes updates in patient status) 1 10 X - Reassessment of Adherence to Treatment Plan 1 5 ASSESSMENTS - Wound and Skin Assessment / Reassessment []  - Simple Wound Assessment / Reassessment - one wound 0 X - Complex Wound Assessment / Reassessment - multiple wounds 3 5 []  - Dermatologic / Skin Assessment (not related to wound area) 0 ASSESSMENTS - Focused Assessment X - Circumferential Edema Measurements - multi extremities 2 5 []  - Nutritional Assessment / Counseling / Intervention 0 X - Lower Extremity Assessment (monofilament, tuning fork, pulses) 1 5 []  - Peripheral Arterial Disease Assessment (using hand held doppler) 0 ASSESSMENTS - Ostomy and/or Continence Assessment and Care []  - Incontinence Assessment and Management 0 []  - Ostomy Care Assessment and Management (repouching, etc.) 0 PROCESS - Coordination of Care X - Simple Patient / Family Education for ongoing care 1 15 []  - Complex (extensive) Patient / Family Education for ongoing care 0 []  - Staff obtains , Records, Test Results / Process Orders 0 []  - Staff telephones HHA, Nursing Homes / Clarify orders / etc 0 []  - Routine Transfer to another Facility (non-emergent condition) 0 [name], [name] ( ) []  - Routine Hospital Admission (non-emergent condition) 0 []  - New Admissions / / Ordering NPWT, Apligraf, etc. 0 []  - Emergency Hospital Admission (emergent condition) 0 X - Simple Discharge Coordination 1 10 []  - Complex (extensive) Discharge Coordination 0 PROCESS - Special Needs []  - Pediatric / Minor Patient Management 0 []  - Isolation Patient Management 0 []  - Hearing / Language / Visual special needs 0 []  - Assessment of Community assistance (transportation, D/C planning, etc.) 0 []  - Additional assistance / Altered mentation 0 []  - Support Surface(s) Assessment (bed, cushion, seat, etc.) 0 INTERVENTIONS -  Wound Cleansing / Measurement []  - Simple Wound Cleansing - one wound 0 X - Complex Wound Cleansing - multiple wounds 3 5 X - Wound  Imaging (photographs - any number of wounds) 1 5 []  - Wound Tracing (instead of photographs) 0 []  - Simple Wound Measurement - one wound 0 X - Complex Wound Measurement - multiple wounds 3 5 INTERVENTIONS - Wound Dressings []  - Small Wound Dressing one or multiple wounds 0 []  - Medium Wound Dressing one or multiple wounds 0 X - Large Wound Dressing one or multiple wounds 3 20 []  - Application of Medications - topical 0 []  - Application of Medications - injection 0 INTERVENTIONS - Miscellaneous []  - External ear exam 0 Gaede, Everleigh L. ([id number]) []  - Specimen Collection (cultures, biopsies, blood, body fluids, etc.) 0 []  - Specimen(s) / Culture(s) sent or taken to Lab for analysis 0 []  - Patient Transfer (multiple staff / Michiel Sites Lift / Similar devices) 0 []  - Simple Staple / Suture removal (25 or less) 0 []  - Complex Staple / Suture removal (26 or more) 0 []  - Hypo / Hyperglycemic Management (close monitor of Blood Glucose) 0 []  - Ankle / Brachial Index (ABI) - do not check if billed separately 0 X - Vital Signs 1 5 Has the patient been seen at the hospital within the last three years: Yes Total Score: 170 Level Of Care: New/Established - Level 5 Electronic Signature(s) Signed: 05/18/2016 5:47:08 PM By: Curtis Sites Entered By: Curtis Sites on 05/18/2016 17:09:58 [name], [name] ([id number]) -------------------------------------------------------------------------------- Encounter Discharge Information Details Patient Name: [name], [name] Date of Service: 05/18/2016 1:30 PM Medical Record Number: [id number] Patient Account Number: [id number] Date of Birth/Sex: 1956-04-18 (60 y.o. Female) Treating RN: Curtis Sites Primary Care Physician: Sherrie Mustache Other Clinician: Referring Physician: Sherrie Mustache Treating Physician/Extender:  Linwood Dibbles, HOYT Weeks in Treatment: 21 Encounter Discharge Information Items Discharge Pain Level: 0 Discharge Condition: Stable Ambulatory Status: Ambulatory Discharge Destination: Home Transportation: Private Auto Accompanied By: self Schedule Follow-up Appointment: Yes Medication Reconciliation completed and provided to Patient/Care No Khira Cudmore: Provided on Clinical Summary of Care: 05/18/2016 Form Type Recipient Paper Patient GC Electronic Signature(s) Signed: 05/18/2016 5:13:27 PM By: Curtis Sites Previous Signature: 05/18/2016 3:06:04 PM Version By: Gwenlyn Perking Entered By: Curtis Sites on 05/18/2016 17:13:27 [name], [name] ([id number]) -------------------------------------------------------------------------------- Lower Extremity Assessment Details Patient Name: Hainline, Aerionna L. Date of Service: 05/18/2016 1:30 PM Medical Record Number: [id number] Patient Account Number: [id number] Date of Birth/Sex: 1956/01/02 (60 y.o. Female) Treating RN: Curtis Sites Primary Care Physician: Sherrie Mustache Other Clinician: Referring Physician: Sherrie Mustache Treating Physician/Extender: Linwood Dibbles, HOYT Weeks in Treatment: 21 Edema Assessment Assessed: [Left: No] [Right: No] Edema: [Left: Yes] [Right: Yes] Calf Left: Right: Point of Measurement: 31 cm From Medial Instep 47 cm 52 cm Ankle Left: Right: Point of Measurement: 11 cm From Medial Instep 27.5 cm 25 cm Vascular Assessment Pulses: Posterior Tibial Dorsalis Pedis Palpable: [Left:Yes] [Right:Yes] Extremity colors, hair growth, and conditions: Extremity Color: [Left:Hyperpigmented] [Right:Hyperpigmented] Hair Growth on Extremity: [Left:No] [Right:No] Temperature of Extremity: [Left:Warm] [Right:Warm] Capillary Refill: [Left:< 3 seconds] [Right:< 3 seconds] Electronic Signature(s) Signed: 05/18/2016 5:47:08 PM By: Curtis Sites Entered By: Curtis Sites on 05/18/2016 14:35:18 [name], [name]  ([id number]) -------------------------------------------------------------------------------- Multi Wound Chart Details Patient Name: [name], [name] Date of Service: 05/18/2016 1:30 PM Medical Record Number: [id number] Patient Account Number: [id number] Date of Birth/Sex: June 07, 1956 (60 y.o. Female) Treating RN: Curtis Sites Primary Care Physician: Sherrie Mustache Other Clinician: Referring Physician: Sherrie Mustache Treating Physician/Extender: Linwood Dibbles, HOYT Weeks in Treatment: 21 Vital Signs Height(in): 62 Pulse(bpm): 90 Weight(lbs): 170 Blood Pressure 181/97 (mmHg): Body Mass Index(BMI): 31 Temperature(F): 98.1  Respiratory Rate 18 (breaths/min): Photos: [1:No Photos] [2:No Photos] [3:No Photos] Wound Location: [1:Right Lower Leg - Medial Left Lower Leg - Posterior Right Lower Leg - Medial,] [3:Proximal] Wounding Event: [1:Blister] [2:Gradually Appeared] [3:Blister] Primary Etiology: [1:Venous Leg Ulcer] [2:Lymphedema] [3:Venous Leg Ulcer] Date Acquired: [1:12/15/2015] [2:04/19/2016] [3:05/18/2016] Weeks of Treatment: [1:21] [2:2] [3:0] Wound Status: [1:Open] [2:Open] [3:Open] Measurements L x W x D 3.6x2.2x0.1 [2:1.7x1.4x0.2] [3:4.4x6x0.2] (cm) Area (cm) : [1:6.22] [2:1.869] [3:20.735] Volume (cm) : [1:0.622] [2:0.374] [3:4.147] % Reduction in Area: [1:76.00%] [2:-120.40%] [3:N/A] % Reduction in Volume: 76.00% [2:-340.00%] [3:N/A] Classification: [1:Partial Thickness] [2:Partial Thickness] [3:Full Thickness Without Exposed Support Structures] Exudate Amount: [1:Large] [2:Medium] [3:Large] Exudate Type: [1:Serous] [2:Serous] [3:Sanguinous] Exudate Color: [1:amber] [2:amber] [3:red] Wound Margin: [1:Flat and Intact] [2:Flat and Intact] [3:Flat and Intact] Granulation Amount: [1:Large (67-100%)] [2:Small (1-33%)] [3:Large (67-100%)] Granulation Quality: [1:Red] [2:Red] [3:Red, Pink] Necrotic Amount: [1:None Present (0%)] [2:Small (1-33%)] [3:Small (1-33%)] Exposed  Structures: [1:Fascia: No Fat: No Tendon: No Muscle: No Joint: No Bone: No] [2:Fascia: No Fat: No Tendon: No Muscle: No Joint: No Bone: No] [3:Fascia: No Fat: No Tendon: No Muscle: No Joint: No Bone: No] Limited to Skin Limited to Skin Limited to Skin Breakdown Breakdown Breakdown Epithelialization: Small (1-33%) Large (67-100%) None Periwound Skin Texture: Edema: No Induration: Yes Edema: No Excoriation: No Edema: No Excoriation: No Induration: No Excoriation: No Induration: No Callus: No Callus: No Callus: No Crepitus: No Crepitus: No Crepitus: No Fluctuance: No Fluctuance: No Fluctuance: No Friable: No Friable: No Friable: No Rash: No Rash: No Rash: No Scarring: No Scarring: No Scarring: No Periwound Skin Moist: Yes Moist: Yes Maceration: No Moisture: Maceration: No Maceration: No Moist: No Dry/Scaly: No Dry/Scaly: No Dry/Scaly: No Periwound Skin Color: Atrophie Blanche: No Ecchymosis: Yes Atrophie Blanche: No Cyanosis: No Hemosiderin Staining: Yes Cyanosis: No Ecchymosis: No Atrophie Blanche: No Ecchymosis: No Erythema: No Cyanosis: No Erythema: No Hemosiderin Staining: No Erythema: No Hemosiderin Staining: No Mottled: No Mottled: No Mottled: No Pallor: No Pallor: No Pallor: No Rubor: No Rubor: No Rubor: No Temperature: No Abnormality No Abnormality N/A Tenderness on No Yes Yes Palpation: Wound Preparation: Ulcer Cleansing: Other: Ulcer Cleansing: Other: Ulcer Cleansing: Other: soap and water soap and water soap and water Topical Anesthetic Topical Anesthetic Topical Anesthetic Applied: Other: lidocaine Applied: Other: lidocaine Applied: None 4% 4% Treatment Notes Electronic Signature(s) Signed: 05/18/2016 5:47:08 PM By: Curtis Sites Entered By: Curtis Sites on 05/18/2016 14:47:54 [name], [name] ([id number]) -------------------------------------------------------------------------------- Multi-Disciplinary Care Plan  Details Patient Name: [name], [name] Date of Service: 05/18/2016 1:30 PM Medical Record Number: [id number] Patient Account Number: [id number] Date of Birth/Sex: 21-Oct-1955 (61 y.o. Female) Treating RN: Curtis Sites Primary Care Physician: Sherrie Mustache Other Clinician: Referring Physician: Sherrie Mustache Treating Physician/Extender: Linwood Dibbles, HOYT Weeks in Treatment: 21 Active Inactive Abuse / Safety / Falls / Self Care Management Nursing Diagnoses: Impaired physical mobility Potential for falls Goals: Patient will remain injury free Date Initiated: 12/23/2015 Goal Status: Active Interventions: Assess fall risk on admission and as needed Notes: Nutrition Nursing Diagnoses: Imbalanced nutrition Goals: Patient/caregiver verbalizes understanding of need to maintain therapeutic glucose control per primary care physician Date Initiated: 12/23/2015 Goal Status: Active Interventions: Provide education on nutrition Treatment Activities: Education provided on Nutrition : 01/21/2016 Notes: Orientation to the Wound Care Program Nursing Diagnoses: SYREN, BEAMES ([id number]) Knowledge deficit related to the wound healing center program Goals: Patient/caregiver will verbalize understanding of the Wound Healing Center Program Date Initiated: 12/23/2015 Goal Status: Active Interventions: Provide education on orientation to the  wound center Notes: Venous Leg Ulcer Nursing Diagnoses: Potential for venous Insuffiency (use before diagnosis confirmed) Goals: Patient will maintain optimal edema control Date Initiated: 12/23/2015 Goal Status: Active Interventions: Provide education on venous insufficiency Notes: Wound/Skin Impairment Nursing Diagnoses: Impaired tissue integrity Goals: Ulcer/skin breakdown will heal within 14 weeks Date Initiated: 12/23/2015 Goal Status: Active Interventions: Assess ulceration(s) every visit Notes: Electronic Signature(s) Signed: 05/18/2016  5:47:08 PM By: Curtis Sitesorthy, Joanna Entered By: Curtis Sitesorthy, Joanna on 05/18/2016 14:47:42 Bayless, Nastashia Elbert EwingsL. ([id number]) -------------------------------------------------------------------------------- Pain Assessment Details Patient Name: Scannell, Chelsie L. Date of Service: 05/18/2016 1:30 PM Medical Record Number: [id number] Patient Account Number: [id number] Date of Birth/Sex: 04/03/1956 (60 y.o. Female) Treating RN: Huel CoventryWoody, Kim Primary Care Physician: Sherrie MustacheJADALI, FAYEGH Other Clinician: Referring Physician: Sherrie MustacheJADALI, FAYEGH Treating Physician/Extender: Linwood DibblesSTONE III, HOYT Weeks in Treatment: 21 Active Problems Location of Pain Severity and Description of Pain Patient Has Paino Yes Site Locations Pain Location: Generalized Pain With Dressing Change: Yes Rate the pain. Current Pain Level: 8 Worst Pain Level: 10 Least Pain Level: 5 Character of Pain Describe the Pain: Sharp, Shooting, Throbbing Pain Management and Medication Current Pain Management: Notes Topical or injectable lidocaine is offered to patient for acute pain when surgical debridement is performed. If needed, Patient is instructed to use over the counter pain medication for the following 24-48 hours after debridement. Wound care MDs do not prescribed pain medications. Patient has chronic pain or uncontrolled pain. Patient has been instructed to make an appointment with their Primary Care Physician for pain management. Electronic Signature(s) Signed: 05/20/2016 4:28:52 PM By: Elliot GurneyWoody, RN, [name], [name], [name] Entered By: Elliot GurneyWoody, RN, [name], Kim on 05/18/2016 14:20:41 Iyengar, [name] MallardGLORIA L. ([id number]) -------------------------------------------------------------------------------- Patient/Caregiver Education Details Patient Name: Salsbury, Maddie L. Date of Service: 05/18/2016 1:30 PM Medical Record Number: [id number] Patient Account Number: [id number] Date of Birth/Gender: 06/08/1956 (60 y.o. Female) Treating RN: Curtis Sitesorthy, Joanna Primary Care  Physician: Sherrie MustacheJADALI, FAYEGH Other Clinician: Referring Physician: Sherrie MustacheJADALI, FAYEGH Treating Physician/Extender: Skeet SimmerSTONE III, HOYT Weeks in Treatment: 21 Education Assessment Education Provided To: Patient Education Topics Provided Wound/Skin Impairment: Handouts: Other: come in for rewrap later this week Methods: Explain/Verbal Responses: State content correctly Electronic Signature(s) Signed: 05/18/2016 5:47:08 PM By: Curtis Sitesorthy, Joanna Entered By: Curtis Sitesorthy, Joanna on 05/18/2016 17:13:50 Sessa, [name] MallardGLORIA L. ([id number]) -------------------------------------------------------------------------------- Wound Assessment Details Patient Name: Wescoat, Minervia L. Date of Service: 05/18/2016 1:30 PM Medical Record Number: [id number] Patient Account Number: [id number] Date of Birth/Sex: 07/25/1956 (60 y.o. Female) Treating RN: Curtis Sitesorthy, Joanna Primary Care Physician: Sherrie MustacheJADALI, FAYEGH Other Clinician: Referring Physician: Sherrie MustacheJADALI, FAYEGH Treating Physician/Extender: Linwood DibblesSTONE III, HOYT Weeks in Treatment: 21 Wound Status Wound Number: 1 Primary Etiology: Venous Leg Ulcer Wound Location: Right Lower Leg - Medial Wound Status: Open Wounding Event: Blister Date Acquired: 12/15/2015 Weeks Of Treatment: 21 Clustered Wound: No Photos Wound Measurements Length: (cm) 3.6 Width: (cm) 2.2 Depth: (cm) 0.1 Area: (cm) 6.22 Volume: (cm) 0.622 % Reduction in Area: 76% % Reduction in Volume: 76% Epithelialization: Small (1-33%) Tunneling: No Undermining: No Wound Description Classification: Partial Thickness Wound Margin: Flat and Intact Exudate Amount: Large Exudate Type: Serous Exudate Color: amber Foul Odor After Cleansing: No Wound Bed Granulation Amount: Large (67-100%) Exposed Structure Granulation Quality: Red Fascia Exposed: No Necrotic Amount: None Present (0%) Fat Layer Exposed: No Tendon Exposed: No Muscle Exposed: No Seguin, Reyanne L. ([id number]) Joint Exposed: No Bone Exposed:  No Limited to Skin Breakdown Periwound Skin Texture Texture Color No Abnormalities Noted: No No Abnormalities Noted: No Callus: No Atrophie Blanche: No Crepitus: No Cyanosis: No Excoriation:  No Ecchymosis: No Fluctuance: No Erythema: No Friable: No Hemosiderin Staining: No Induration: No Mottled: No Localized Edema: No Pallor: No Rash: No Rubor: No Scarring: No Temperature / Pain Moisture Temperature: No Abnormality No Abnormalities Noted: No Dry / Scaly: No Maceration: No Moist: Yes Wound Preparation Ulcer Cleansing: Other: soap and water, Topical Anesthetic Applied: Other: lidocaine 4%, Treatment Notes Wound #1 (Right, Medial Lower Leg) 1. Cleansed with: Cleanse wound with antibacterial soap and water 2. Anesthetic Topical Lidocaine 4% cream to wound bed prior to debridement 4. Dressing Applied: Aquacel Ag 5. Secondary Dressing Applied ABD Pad 7. Secured with Other (specify in notes) Notes kerlix and coban wrap from toes to 3cm below the knee Electronic Signature(s) Signed: 05/18/2016 5:47:08 PM By: Curtis Sites Entered By: Curtis Sites on 05/18/2016 15:45:48 Kernan, Nalani Elbert [name] ([id number]) -------------------------------------------------------------------------------- Wound Assessment Details Patient Name: Gatton, Gilberte L. Date of Service: 05/18/2016 1:30 PM Medical Record Number: [id number] Patient Account Number: [id number] Date of Birth/Sex: 06-Jul-1956 (60 y.o. Female) Treating RN: Curtis Sites Primary Care Physician: Sherrie Mustache Other Clinician: Referring Physician: Sherrie Mustache Treating Physician/Extender: Linwood Dibbles, HOYT Weeks in Treatment: 21 Wound Status Wound Number: 2 Primary Etiology: Lymphedema Wound Location: Left Lower Leg - Posterior Wound Status: Open Wounding Event: Gradually Appeared Date Acquired: 04/19/2016 Weeks Of Treatment: 2 Clustered Wound: No Photos Wound Measurements Length: (cm) 1.7 Width: (cm)  1.4 Depth: (cm) 0.2 Area: (cm) 1.869 Volume: (cm) 0.374 % Reduction in Area: -120.4% % Reduction in Volume: -340% Epithelialization: Large (67-100%) Tunneling: No Undermining: No Wound Description Classification: Partial Thickness Wound Margin: Flat and Intact Exudate Amount: Medium Exudate Type: Serous Exudate Color: amber Wound Bed Granulation Amount: Small (1-33%) Exposed Structure Granulation Quality: Red Fascia Exposed: No Necrotic Amount: Small (1-33%) Fat Layer Exposed: No Necrotic Quality: Adherent Slough Tendon Exposed: No Muscle Exposed: No Treadwell, Folasade L. ([id number]) Joint Exposed: No Bone Exposed: No Limited to Skin Breakdown Periwound Skin Texture Texture Color No Abnormalities Noted: No No Abnormalities Noted: No Callus: No Atrophie Blanche: No Crepitus: No Cyanosis: No Excoriation: No Ecchymosis: Yes Fluctuance: No Erythema: No Friable: No Hemosiderin Staining: Yes Induration: Yes Mottled: No Localized Edema: No Pallor: No Rash: No Rubor: No Scarring: No Temperature / Pain Moisture Temperature: No Abnormality No Abnormalities Noted: No Tenderness on Palpation: Yes Dry / Scaly: No Maceration: No Moist: Yes Wound Preparation Ulcer Cleansing: Other: soap and water, Topical Anesthetic Applied: Other: lidocaine 4%, Treatment Notes Wound #2 (Left, Posterior Lower Leg) 1. Cleansed with: Cleanse wound with antibacterial soap and water 2. Anesthetic Topical Lidocaine 4% cream to wound bed prior to debridement 4. Dressing Applied: Aquacel Ag 5. Secondary Dressing Applied ABD Pad 7. Secured with Other (specify in notes) Notes kerlix and coban wrap from toes to 3cm below the knee Electronic Signature(s) Signed: 05/18/2016 5:47:08 PM By: Curtis Sites Entered By: Curtis Sites on 05/18/2016 15:46:21 Nyce, Shavawn Elbert [name] ([id number]) -------------------------------------------------------------------------------- Wound Assessment  Details Patient Name: Mccasland, Reola L. Date of Service: 05/18/2016 1:30 PM Medical Record Number: [id number] Patient Account Number: [id number] Date of Birth/Sex: 09-18-1955 (60 y.o. Female) Treating RN: Curtis Sites Primary Care Physician: Sherrie Mustache Other Clinician: Referring Physician: Sherrie Mustache Treating Physician/Extender: Linwood Dibbles, HOYT Weeks in Treatment: 21 Wound Status Wound Number: 3 Primary Etiology: Venous Leg Ulcer Wound Location: Right Lower Leg - Medial, Wound Status: Open Proximal Wounding Event: Blister Date Acquired: 05/18/2016 Weeks Of Treatment: 0 Clustered Wound: No Photos Wound Measurements Length: (cm) 4.4 Width: (cm) 6 Depth: (cm) 0.2 Area: (cm) 20.735 Volume: (cm)  4.147 % Reduction in Area: 0% % Reduction in Volume: 0% Epithelialization: None Tunneling: No Undermining: No Wound Description Full Thickness Without Exposed Classification: Support Structures Wound Margin: Flat and Intact Exudate Large Amount: Exudate Type: Sanguinous Exudate Color: red Foul Odor After Cleansing: No Wound Bed Granulation Amount: Large (67-100%) Exposed Structure Granulation Quality: Red, Pink Fascia Exposed: No Necrotic Amount: Small (1-33%) Fat Layer Exposed: No Linthicum, Masiyah L. ([id number]) Necrotic Quality: Adherent Slough Tendon Exposed: No Muscle Exposed: No Joint Exposed: No Bone Exposed: No Limited to Skin Breakdown Periwound Skin Texture Texture Color No Abnormalities Noted: No No Abnormalities Noted: No Callus: No Atrophie Blanche: No Crepitus: No Cyanosis: No Excoriation: No Ecchymosis: No Fluctuance: No Erythema: No Friable: No Hemosiderin Staining: No Induration: No Mottled: No Localized Edema: No Pallor: No Rash: No Rubor: No Scarring: No Temperature / Pain Moisture Tenderness on Palpation: Yes No Abnormalities Noted: No Dry / Scaly: No Maceration: No Moist: No Wound Preparation Ulcer Cleansing: Other:  soap and water, Topical Anesthetic Applied: None Treatment Notes Wound #3 (Right, Proximal, Medial Lower Leg) 1. Cleansed with: Cleanse wound with antibacterial soap and water 2. Anesthetic Topical Lidocaine 4% cream to wound bed prior to debridement 4. Dressing Applied: Aquacel Ag 5. Secondary Dressing Applied ABD Pad 7. Secured with Other (specify in notes) Notes kerlix and coban wrap from toes to 3cm below the knee Electronic Signature(s) Signed: 05/18/2016 5:47:08 PM By: Curtis Sites Entered By: Curtis Sites on 05/18/2016 15:46:45 [place], [name] ([id number]) Scarlett, Jillana Elbert [name] ([id number]) -------------------------------------------------------------------------------- Vitals Details Patient Name: Perdue, Emiliya L. Date of Service: 05/18/2016 1:30 PM Medical Record Number: [id number] Patient Account Number: [id number] Date of Birth/Sex: [birth year] (60 y.o. Female) Treating RN: Huel Coventry Primary Care Physician: Sherrie Mustache Other Clinician: Referring Physician: Sherrie Mustache Treating Physician/Extender: Linwood Dibbles, HOYT Weeks in Treatment: 21 Vital Signs Time Taken: 14:20 Temperature (F): 98.1 Height (in): 62 Pulse (bpm): 90 Weight (lbs): 170 Respiratory Rate (breaths/min): 18 Body Mass Index (BMI): 31.1 Blood Pressure (mmHg): 181/97 Reference Range: 80 - 120 mg / dl Electronic Signature(s) Signed: 05/20/2016 4:28:52 PM By: Elliot Gurney, RN, [name], [name], [name] Entered By: Elliot Gurney, RN, [name], Kim on 05/18/2016 14:21:00

## 2016-05-22 NOTE — Progress Notes (Signed)
[name] ([id number]) Visit Report for 05/21/2016 Arrival Information Details Patient Name: [name], [name] Date of Service: 05/21/2016 2:15 PM Medical Record Number: [id number] Patient Account Number: [id number] Date of Birth/Sex: 03-02-1956 (60 y.o. Female) Treating RN: Curtis Sites Primary Care Physician: Sherrie Mustache Other Clinician: Referring Physician: Sherrie Mustache Treating Physician/Extender: Eugene Garnet in Treatment: 21 Visit Information History Since Last Visit Added or deleted any medications: No Patient Arrived: Ambulatory Any new allergies or adverse reactions: No Arrival Time: 14:28 Had a fall or experienced change in No Accompanied By: self activities of daily living that may affect Transfer Assistance: None risk of falls: Patient Identification Verified: Yes Signs or symptoms of abuse/neglect since last No Secondary Verification Process Yes visito Completed: Hospitalized since last visit: No Patient Requires Transmission-Based No Pain Present Now: No Precautions: Patient Has Alerts: No Electronic Signature(s) Signed: 05/21/2016 5:08:51 PM By: Curtis Sites Entered By: Curtis Sites on 05/21/2016 14:28:37 Martus, Gwendy Elbert Ewings ([id number]) -------------------------------------------------------------------------------- Clinic Level of Care Assessment Details Patient Name: [name], [name] Date of Service: 05/21/2016 2:15 PM Medical Record Number: [id number] Patient Account Number: [id number] Date of Birth/Sex: 1956/07/18 (60 y.o. Female) Treating RN: Curtis Sites Primary Care Physician: Sherrie Mustache Other Clinician: Referring Physician: Sherrie Mustache Treating Physician/Extender: Eugene Garnet in Treatment: 21 Clinic Level of Care Assessment Items TOOL 4 Quantity Score []  - Use when only an EandM is performed on FOLLOW-UP visit 0 ASSESSMENTS - Nursing Assessment / Reassessment X - Reassessment of Co-morbidities  (includes updates in patient status) 1 10 X - Reassessment of Adherence to Treatment Plan 1 5 ASSESSMENTS - Wound and Skin Assessment / Reassessment []  - Simple Wound Assessment / Reassessment - one wound 0 X - Complex Wound Assessment / Reassessment - multiple wounds 3 5 []  - Dermatologic / Skin Assessment (not related to wound area) 0 ASSESSMENTS - Focused Assessment []  - Circumferential Edema Measurements - multi extremities 0 []  - Nutritional Assessment / Counseling / Intervention 0 []  - Lower Extremity Assessment (monofilament, tuning fork, pulses) 0 []  - Peripheral Arterial Disease Assessment (using hand held doppler) 0 ASSESSMENTS - Ostomy and/or Continence Assessment and Care []  - Incontinence Assessment and Management 0 []  - Ostomy Care Assessment and Management (repouching, etc.) 0 PROCESS - Coordination of Care X - Simple Patient / Family Education for ongoing care 1 15 []  - Complex (extensive) Patient / Family Education for ongoing care 0 []  - Staff obtains , Records, Test Results / Process Orders 0 []  - Staff telephones HHA, Nursing Homes / Clarify orders / etc 0 []  - Routine Transfer to another Facility (non-emergent condition) 0 Crowl, Edla L. ( ) []  - Routine Hospital Admission (non-emergent condition) 0 []  - New Admissions / / Ordering NPWT, Apligraf, etc. 0 []  - Emergency Hospital Admission (emergent condition) 0 X - Simple Discharge Coordination 1 10 []  - Complex (extensive) Discharge Coordination 0 PROCESS - Special Needs []  - Pediatric / Minor Patient Management 0 []  - Isolation Patient Management 0 []  - Hearing / Language / Visual special needs 0 []  - Assessment of Community assistance (transportation, D/C planning, etc.) 0 []  - Additional assistance / Altered mentation 0 []  - Support Surface(s) Assessment (bed, cushion, seat, etc.) 0 INTERVENTIONS - Wound Cleansing / Measurement []  - Simple Wound Cleansing - one  wound 0 X - Complex Wound Cleansing - multiple wounds 3 5 X - Wound Imaging (photographs - any number of wounds) 1 5 []  - Wound Tracing (instead of photographs) 0 []  -  Simple Wound Measurement - one wound 0 X - Complex Wound Measurement - multiple wounds 3 5 INTERVENTIONS - Wound Dressings []  - Small Wound Dressing one or multiple wounds 0 []  - Medium Wound Dressing one or multiple wounds 0 X - Large Wound Dressing one or multiple wounds 3 20 []  - Application of Medications - topical 0 []  - Application of Medications - injection 0 INTERVENTIONS - Miscellaneous []  - External ear exam 0 Langhorne, Aundreya L. ([id number]) []  - Specimen Collection (cultures, biopsies, blood, body fluids, etc.) 0 []  - Specimen(s) / Culture(s) sent or taken to Lab for analysis 0 []  - Patient Transfer (multiple staff / Michiel Sites Lift / Similar devices) 0 []  - Simple Staple / Suture removal (25 or less) 0 []  - Complex Staple / Suture removal (26 or more) 0 []  - Hypo / Hyperglycemic Management (close monitor of Blood Glucose) 0 []  - Ankle / Brachial Index (ABI) - do not check if billed separately 0 X - Vital Signs 1 5 Has the patient been seen at the hospital within the last three years: Yes Total Score: 155 Level Of Care: New/Established - Level 4 Electronic Signature(s) Signed: 05/21/2016 5:08:51 PM By: Curtis Sites Entered By: Curtis Sites on 05/21/2016 15:24:29 Veasey, Aayra Elbert Ewings ([id number]) -------------------------------------------------------------------------------- Encounter Discharge Information Details Patient Name: [name], [name] Date of Service: 05/21/2016 2:15 PM Medical Record Number: [id number] Patient Account Number: [id number] Date of Birth/Sex: [birth year] (60 y.o. Female) Treating RN: Curtis Sites Primary Care Physician: Sherrie Mustache Other Clinician: Referring Physician: Sherrie Mustache Treating Physician/Extender: Eugene Garnet in Treatment: 21 Encounter Discharge  Information Items Discharge Pain Level: 0 Discharge Condition: Stable Ambulatory Status: Ambulatory Discharge Destination: Home Private Transportation: Auto Accompanied By: self Schedule Follow-up Appointment: Yes Medication Reconciliation completed and No provided to Patient/Care Iren Whipp: Clinical Summary of Care: Electronic Signature(s) Signed: 05/21/2016 3:23:43 PM By: Curtis Sites Entered By: Curtis Sites on 05/21/2016 15:23:43 Abbs, [name] ([id number]) -------------------------------------------------------------------------------- Patient/Caregiver Education Details Patient Name: [name]. Date of Service: 05/21/2016 2:15 PM Medical Record Number: [id number] Patient Account Number: [id number] Date of Birth/Gender: 21-Oct-1955 (60 y.o. Female) Treating RN: Curtis Sites Primary Care Physician: Sherrie Mustache Other Clinician: Referring Physician: Sherrie Mustache Treating Physician/Extender: Eugene Garnet in Treatment: 21 Education Assessment Education Provided To: Patient Education Topics Provided Venous: Handouts: Other: leg elevation Methods: Demonstration, Explain/Verbal Responses: State content correctly Electronic Signature(s) Signed: 05/21/2016 5:08:51 PM By: Curtis Sites Entered By: Curtis Sites on 05/21/2016 15:23:17 Finch, Cataleah Elbert Ewings ([id number]) -------------------------------------------------------------------------------- Wound Assessment Details Patient Name: Markwood, Noreen L. Date of Service: 05/21/2016 2:15 PM Medical Record Number: [id number] Patient Account Number: [id number] Date of Birth/Sex: June 09, 1956 (60 y.o. Female) Treating RN: Curtis Sites Primary Care Physician: Sherrie Mustache Other Clinician: Referring Physician: Sherrie Mustache Treating Physician/Extender: Eugene Garnet in Treatment: 21 Wound Status Wound Number: 1 Primary Etiology: Venous Leg Ulcer Wound Location: Right Lower Leg -  Medial Wound Status: Open Wounding Event: Blister Date Acquired: 12/15/2015 Weeks Of Treatment: 21 Clustered Wound: No Photos Wound Measurements Length: (cm) 3.6 Width: (cm) 2.2 Depth: (cm) 0.1 Area: (cm) 6.22 Volume: (cm) 0.622 % Reduction in Area: 76% % Reduction in Volume: 76% Epithelialization: Small (1-33%) Tunneling: No Undermining: No Wound Description Classification: Partial Thickness Wound Margin: Flat and Intact Exudate Amount: Large Exudate Type: Serous Exudate Color: amber Foul Odor After Cleansing: No Wound Bed Granulation Amount: Large (67-100%) Exposed Structure Granulation Quality: Red Fascia Exposed: No Necrotic Amount: None Present (0%) Fat Layer Exposed: No Tendon Exposed: No Muscle Exposed:  No Meacham, Norlene L. ([id number]) Joint Exposed: No Bone Exposed: No Limited to Skin Breakdown Periwound Skin Texture Texture Color No Abnormalities Noted: No No Abnormalities Noted: No Callus: No Atrophie Blanche: No Crepitus: No Cyanosis: No Excoriation: No Ecchymosis: No Fluctuance: No Erythema: No Friable: No Hemosiderin Staining: No Induration: No Mottled: No Localized Edema: No Pallor: No Rash: No Rubor: No Scarring: No Temperature / Pain Moisture Temperature: No Abnormality No Abnormalities Noted: No Dry / Scaly: No Maceration: No Moist: Yes Wound Preparation Ulcer Cleansing: Other: soap and water, Topical Anesthetic Applied: None Treatment Notes Wound #1 (Right, Medial Lower Leg) 1. Cleansed with: Cleanse wound with antibacterial soap and water 4. Dressing Applied: Aquacel Ag 5. Secondary Dressing Applied ABD Pad 7. Secured with Other (specify in notes) Notes kerlix and coban wrap from toes to 3cm below the knee Electronic Signature(s) Signed: 05/21/2016 5:08:51 PM By: Curtis Sites Entered By: Curtis Sites on 05/21/2016 14:50:28 Dam, Kiyanna Elbert Ewings  ([id number]) -------------------------------------------------------------------------------- Wound Assessment Details Patient Name: Mula, Yuliya L. Date of Service: 05/21/2016 2:15 PM Medical Record Number: [id number] Patient Account Number: [id number] Date of Birth/Sex: 27-Mar-1956 (60 y.o. Female) Treating RN: Curtis Sites Primary Care Physician: Sherrie Mustache Other Clinician: Referring Physician: Sherrie Mustache Treating Physician/Extender: Eugene Garnet in Treatment: 21 Wound Status Wound Number: 2 Primary Etiology: Lymphedema Wound Location: Left Lower Leg - Posterior Wound Status: Open Wounding Event: Gradually Appeared Date Acquired: 04/19/2016 Weeks Of Treatment: 2 Clustered Wound: No Photos Wound Measurements Length: (cm) 1.7 Width: (cm) 1.4 Depth: (cm) 0.2 Area: (cm) 1.869 Volume: (cm) 0.374 % Reduction in Area: -120.4% % Reduction in Volume: -340% Epithelialization: Large (67-100%) Tunneling: No Undermining: No Wound Description Classification: Partial Thickness Wound Margin: Flat and Intact Exudate Amount: Medium Exudate Type: Serous Exudate Color: amber Wound Bed Granulation Amount: Small (1-33%) Exposed Structure Granulation Quality: Red Fascia Exposed: No Necrotic Amount: Small (1-33%) Fat Layer Exposed: No Necrotic Quality: Adherent Slough Tendon Exposed: No Muscle Exposed: No Scaffidi, Shanley L. ([id number]) Joint Exposed: No Bone Exposed: No Limited to Skin Breakdown Periwound Skin Texture Texture Color No Abnormalities Noted: No No Abnormalities Noted: No Callus: No Atrophie Blanche: No Crepitus: No Cyanosis: No Excoriation: No Ecchymosis: Yes Fluctuance: No Erythema: No Friable: No Hemosiderin Staining: Yes Induration: Yes Mottled: No Localized Edema: No Pallor: No Rash: No Rubor: No Scarring: No Temperature / Pain Moisture Temperature: No Abnormality No Abnormalities Noted: No Tenderness on Palpation: Yes Dry  / Scaly: No Maceration: No Moist: Yes Wound Preparation Ulcer Cleansing: Other: soap and water, Topical Anesthetic Applied: None Treatment Notes Wound #2 (Left, Posterior Lower Leg) 1. Cleansed with: Cleanse wound with antibacterial soap and water 4. Dressing Applied: Aquacel Ag 5. Secondary Dressing Applied ABD Pad 7. Secured with Other (specify in notes) Notes kerlix and coban wrap from toes to 3cm below the knee Electronic Signature(s) Signed: 05/21/2016 5:08:51 PM By: Curtis Sites Entered By: Curtis Sites on 05/21/2016 14:50:54 Hejl, Kyana Elbert Ewings ([id number]) -------------------------------------------------------------------------------- Wound Assessment Details Patient Name: Rubano, Dafne L. Date of Service: 05/21/2016 2:15 PM Medical Record Number: [id number] Patient Account Number: [id number] Date of Birth/Sex: [birth year] (60 y.o. Female) Treating RN: Curtis Sites Primary Care Physician: Sherrie Mustache Other Clinician: Referring Physician: Sherrie Mustache Treating Physician/Extender: Eugene Garnet in Treatment: 21 Wound Status Wound Number: 3 Primary Etiology: Venous Leg Ulcer Wound Location: Right Lower Leg - Medial, Wound Status: Open Proximal Wounding Event: Blister Date Acquired: 05/18/2016 Weeks Of Treatment: 0 Clustered Wound: No Photos Wound Measurements Length: (cm) 4.4 Width: (cm) 6  Depth: (cm) 0.2 Area: (cm) 20.735 Volume: (cm) 4.147 % Reduction in Area: 0% % Reduction in Volume: 0% Epithelialization: None Tunneling: No Undermining: No Wound Description Full Thickness Without Exposed Classification: Support Structures Wound Margin: Flat and Intact Exudate Large Amount: Exudate Type: Sanguinous Exudate Color: red Foul Odor After Cleansing: No Wound Bed Granulation Amount: Large (67-100%) Exposed Structure Granulation Quality: Red, Pink Fascia Exposed: No Necrotic Amount: Small (1-33%) Fat Layer Exposed:  No Coffield, Chenika L. ([id number]) Necrotic Quality: Adherent Slough Tendon Exposed: No Muscle Exposed: No Joint Exposed: No Bone Exposed: No Limited to Skin Breakdown Periwound Skin Texture Texture Color No Abnormalities Noted: No No Abnormalities Noted: No Callus: No Atrophie Blanche: No Crepitus: No Cyanosis: No Excoriation: No Ecchymosis: No Fluctuance: No Erythema: No Friable: No Hemosiderin Staining: No Induration: No Mottled: No Localized Edema: No Pallor: No Rash: No Rubor: No Scarring: No Temperature / Pain Moisture Tenderness on Palpation: Yes No Abnormalities Noted: No Dry / Scaly: No Maceration: No Moist: No Wound Preparation Ulcer Cleansing: Other: soap and water, Topical Anesthetic Applied: None Treatment Notes Wound #3 (Right, Proximal, Medial Lower Leg) 1. Cleansed with: Cleanse wound with antibacterial soap and water 4. Dressing Applied: Aquacel Ag 5. Secondary Dressing Applied ABD Pad 7. Secured with Other (specify in notes) Notes kerlix and coban wrap from toes to 3cm below the knee Electronic Signature(s) Signed: 05/21/2016 5:08:51 PM By: Curtis Sites Entered By: Curtis Sites on 05/21/2016 14:51:19

## 2016-05-25 ENCOUNTER — Encounter: Payer: Medicare Other | Admitting: Physician Assistant

## 2016-05-25 DIAGNOSIS — I87331 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 lower extremity: Secondary | ICD-10-CM | POA: Diagnosis not present

## 2016-05-26 ENCOUNTER — Ambulatory Visit
Admission: RE | Admit: 2016-05-26 | Discharge: 2016-05-26 | Disposition: A | Discharge status: Home / Self Care | Payer: Medicare Other | Source: Ambulatory Visit | Attending: Gastroenterology | Admitting: Gastroenterology

## 2016-05-26 ENCOUNTER — Inpatient Hospital Stay: Admission: RE | Admit: 2016-05-26 | Payer: Medicare Other | Source: Ambulatory Visit

## 2016-05-26 DIAGNOSIS — R945 Abnormal results of liver function studies: Secondary | ICD-10-CM

## 2016-05-26 DIAGNOSIS — R932 Abnormal findings on diagnostic imaging of liver and biliary tract: Secondary | ICD-10-CM | POA: Diagnosis not present

## 2016-05-26 DIAGNOSIS — Z9049 Acquired absence of other specified parts of digestive tract: Secondary | ICD-10-CM | POA: Diagnosis not present

## 2016-05-26 DIAGNOSIS — R7989 Other specified abnormal findings of blood chemistry: Secondary | ICD-10-CM | POA: Insufficient documentation

## 2016-05-26 DIAGNOSIS — Z8619 Personal history of other infectious and parasitic diseases: Secondary | ICD-10-CM | POA: Insufficient documentation

## 2016-05-26 NOTE — Progress Notes (Signed)
[name] ([id number]) Visit Report for 05/25/2016 Chief Complaint Document Details Patient Name: [name], [name] Date of Service: 05/25/2016 1:30 PM Medical Record Number: [id number] Patient Account Number: [id number] Date of Birth/Sex: 01/02/1956 (60 y.o. Female) Treating RN: Curtis Sites Primary Care Physician: Sherrie Mustache Other Clinician: Referring Physician: Sherrie Mustache Treating Physician/Extender: Linwood Dibbles, [name]: 22 Information Obtained from: Patient Chief Complaint Patient is here for follow-up regarding her bilateral lower extremity venous stasis ulcers Electronic Signature(s) Signed: 05/26/2016 9:39:47 AM By: Lenda Kelp PA-C Entered By: Lenda Kelp on 05/25/2016 14:34:39 [name] ([id number]) -------------------------------------------------------------------------------- HPI Details Patient Name: [name], [name] Date of Service: 05/25/2016 1:30 PM Medical Record Number: [id number] Patient Account Number: [id number] Date of Birth/Sex: August 03, 1956 (60 y.o. Female) Treating RN: Curtis Sites Primary Care Physician: Sherrie Mustache Other Clinician: Referring Physician: Sherrie Mustache Treating Physician/Extender: Linwood Dibbles, [name]: 22 History of Present Illness HPI Description: 12/23/15; this is a 59 year old lady who recently relocated back to [place] from [place]. She is staying with a family friend previously lived with a daughter in [place]. 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 She is on chronic prednisone she is not a diabetic. Dose of prednisone is 10 mg. The patient tells me that roughly a week ago she developed a fairly substantial blister over this area that then ruptured. She was seen in the emergency room on 5/7 an x-ray of the leg was negative she was put on Septra although I don't think any cultures were done. She  also tells me she has had chronic edema in her legs for a number of years. She had a similar presentation to currently in the right lateral lower leg roughly a year ago that she was able to heal on her own. As noted she has systemic lupus but does not have lupus nephritis according to the patient. She has a lot of edema in her lower legs. The ABI's could not be obtained. 12/31/15; the patient's insurance which is [place] makes her out of network for any home health therefore we have been changing her dressing in our facility. I reviewed her trip to the ER on 12/21/2015 her creatinine was within the normal range hemoglobin and white count normal. Culture of the wound was negative. X-ray of the leg showed soft tissue edema no foreign bodies. We have been dressing this with Aquacel Ag due to the amount of ongoing drainage 01/07/16; the patient had her arterial studies this morning I don't have these results area she comes back in to have [place] rewrap since she doesn't have access to home health 01/14/16 I still do not have the results of her arterial studies. Our nurse correctly pointed out that we've been wrapping the left leg without an open wound I think this had to do with the fact that she had so much edema when she came in I was concerned about leaving this unwrapped. 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 We left her left leg unwrapped last week as she has no open wound here today she has extensive edema here. We did order stockings however I believe the company called but they have not called them back. She comes back in today with extensive edema in the left leg. We have her  arterial studies which showed triphasic waves and all locations tested including her posterior tibial and anterior tibial arteries bilaterally. Report listed no hemodynamically significant  bilateral lower extremity arterial stenosis. She has her venous studies later this month. I'm going to rewrap the left leg due to the extent of the edema, transitioning her into the stocking we ordered last week as soon as one is available 01/27/16 the patient arrived today with pooled serosanguineous drainage over the wound it would not absorb into her Aquacel Ag which is somewhat on. In spite of this the dimensions of her wound are down considerably and the wound bed looks healthy without any need for debridement. She is obtained her juxtalite stockings the left leg which has no open wound. We are waiting for the right leg wound to get smaller before ordering her one for the right leg 02/03/16; patient wound looked improved. No debridement was required. We've been using the juxtalite [name], [name] ([id number]) stocking on the left leg which is no open wound. 02/11/16 wound is remarkably better. No debridement was required. Healthy rim of epithelialization. We have been using Aquacel Ag, we'll try to close this down with RTD over the next 2 weeks 02/18/16 wound continues to improve down a centimeter in length and width. No debridement was required. Advancing epithelialization. We started RTD last week 02/25/16: pt brought juxtalite for right lower leg today. nurse reports RTD was dry and adhered to the wound. required removal by NS. wound bed with 100% healthy red granulation tissue. denies systemic s/s of infection. 03/02/16; Considerable improvement. no debridement. 03/09/16; wound bed is very vascular no major change in dimensions. Continuing with RTD 03/16/16; unfortunately no major change in dimensions however the wound bed looks very healthy. I will continue with RTD for one more week. If his stalls consider change to collagen. 03/30/2016 -- she's been using juxta lites on her left lower extremity but her lymphedema is very significantly increased and she is not tolerating the juxta lites  the edema has increased symptoms significantly 04/06/16; apparently the RTD started sticking to the wound therefore she was changed to sorbact. Her 4-layer wrap appears to have slipped one third of the way down her leg. She was tolerating the juxtalite on the left leg, I don't see the need to wrap the left leg. She is not a candidate for external compression pumps secondary to insurance issues. 04/13/16; wound is actually measuring larger. Has been using Sorbact 04/20/16; although the wound dimensions of really not changed all that much, the better this certainly looks healthy and there is advancing epithelialization from both sides. No evidence of infection 04/27/16 changed to [facility] last week and the dimensions appear to be a lot better. No evidence of infection. She does not put the juxtalite stocking on the left leg on correctly nevertheless she has never had an open wound here. I am really uncertain whether we ever got 2 to juxtalite stockings for her. The wound started as a blister 05/04/16; finally with Hydrofera Blue last week the area on the right leg has closed over. However in inspection of her left leg she now has a small wound on the posterior aspect of the left leg. She has been using juxta lites on this area but probably not really applying them correctly and with enough tension. She is not a candidate for compression pumps secondary to insurance issues 05/11/16; the area on her right anterior leg and closed over last week however we noted an area  on her left posterior calf. I elected to wrap both her legs in anticipation we be able to transition her to juxtalite stockings. She arrives today the wraps and fallen down on both sides. The area on the right anterior leg has reopened she also had a very tense blister on the right anterior lateral leg just above her wound which I elected to excise. The area on the posterior left leg is roughly about the same 05/18/16 patient presents  today for a follow-up concerning her right and left lower extremity venous stasis ulcers. Currently her wraps which are 4-layer compression have been sliding down due to the amount of swelling that she has. This is causing stasis above the wraps were really does not need to be and subsequently leading to her having blistering especially on the left above where the wrap is. In fact she has a large wound in this area that was just blistered last week and was not nearly as bad as what it is right now. Unfortunately despite what we have tried with the compression at this point I feel that we are almost being counterproductive in this respect. She tells me that her pain as a 9 out of 10 at this point in time. this pain is worse with manipulation of the wound including cleaning as well as just touching. 05/25/16 patient presents today for follow-up unfortunately her bilateral lower extremities actually significantly more edematous throughout. Last visit her wraps that actually slipped down which had been the course of things of the past several visits. For that reason we actually with light of compression to hopefully make this more uniform. Unfortunately the Kerlex and Coban compression at this point in time appears to have been suboptimal. she continues to have a significant amount of swelling in bilateral lower Delvecchio, Liticia L. ([id number]) extremities and unfortunately had several new wounds which have surfaced of the right lower extremity at this point in time. Again venous in nature. She tells me that she does "elevate her legs" as much as she can. She notes that her legs felt tight and she relates this to be may be a 8 out of 10 at this point in time but is not having as much discomfort as last week. she is on Lasix currently 20 mg. Electronic Signature(s) Signed: 05/26/2016 9:39:47 AM By: Lenda Kelp PA-C Entered By: Lenda Kelp on 05/25/2016 14:37:16 Hag[place]orn, [name]  ([id number]) -------------------------------------------------------------------------------- Physical Exam Details Patient Name: [name], [name] Date of Service: 05/25/2016 1:30 PM Medical Record Number: [id number] Patient Account Number: [id number] Date of Birth/Sex: 1956/04/27 (60 y.o. Female) Treating RN: Curtis Sites Primary Care Physician: Sherrie Mustache Other Clinician: Referring Physician: Sherrie Mustache Treating Physician/Extender: STONE III, [name]: 22 Constitutional obese but in no apparent distress. Respiratory normal breathing without difficulty. Cardiovascular 2+ dorsalis pedis/posterior tibialis pulses. Patient has significant bilateral lower extremity edemawith evidence of lymphedema stage III.Marland Kitchen Psychiatric this patient is able to make decisions and demonstrates good insight into disease process. Alert and Oriented x 3. pleasant and cooperative. Electronic Signature(s) Signed: 05/26/2016 9:39:47 AM By: Lenda Kelp PA-C Entered By: Lenda Kelp on 05/25/2016 14:38:39 Newcom, [name] ([id number]) -------------------------------------------------------------------------------- Physician Orders Details Patient Name: [name], [name] Date of Service: 05/25/2016 1:30 PM Medical Record Number: [id number] Patient Account Number: [id number] Date of Birth/Sex: Jul 15, 1956 (60 y.o. Female) Treating RN: Curtis Sites Primary Care Physician: Sherrie Mustache Other Clinician: Referring Physician: Sherrie Mustache Treating Physician/Extender: STONE III, [name]: 22  Verbal / Phone Orders: Yes Clinician: Curtis Sites Read Back and Verified: Yes Diagnosis Coding Wound Cleansing Wound #1 Right,Medial Lower Leg o Cleanse wound with mild soap and water Wound #2 Left,Posterior Lower Leg o Cleanse wound with mild soap and water Wound #3 Right,Proximal,Medial Lower Leg o Cleanse wound with mild soap and water Wound #4  Right,Posterior Lower Leg o Cleanse wound with mild soap and water Wound #5 Left,Proximal,Medial Lower Leg o Cleanse wound with mild soap and water Skin Barriers/Peri-Wound Care Wound #1 Right,Medial Lower Leg o Moisturizing lotion Wound #2 Left,Posterior Lower Leg o Moisturizing lotion Wound #3 Right,Proximal,Medial Lower Leg o Moisturizing lotion Wound #4 Right,Posterior Lower Leg o Moisturizing lotion Wound #5 Left,Proximal,Medial Lower Leg o Moisturizing lotion Primary Wound Dressing Wound #1 Right,Medial Lower Leg o Sorbalgon Ag - in clinic - silver alginate for [facility] Wound #2 Left,Posterior Lower Leg Clausing, Avryl L. ([id number]) o Sorbalgon Ag - in clinic - silver alginate for [facility] Wound #3 Right,Proximal,Medial Lower Leg o Sorbalgon Ag - in clinic - silver alginate for [facility] Wound #4 Right,Posterior Lower Leg o Sorbalgon Ag - in clinic - silver alginate for [facility] Wound #5 Left,Proximal,Medial Lower Leg o Sorbalgon Ag - in clinic - silver alginate for [facility] Secondary Dressing Wound #1 Right,Medial Lower Leg o ABD pad - xtrasorb if needed for drainage Wound #2 Left,Posterior Lower Leg o ABD pad - xtrasorb if needed for drainage Wound #3 Right,Proximal,Medial Lower Leg o ABD pad - xtrasorb if needed for drainage Wound #4 Right,Posterior Lower Leg o ABD pad - xtrasorb if needed for drainage Wound #5 Left,Proximal,Medial Lower Leg o ABD pad - xtrasorb if needed for drainage Dressing Change Frequency Wound #1 Right,Medial Lower Leg o Other: - twice weekly - once at [facility] and once by [facility] Wound #2 Left,Posterior Lower Leg o Other: - twice weekly - once at [facility] and once by [facility] Wound #3 Right,Proximal,Medial Lower Leg o Other: - twice weekly - once at [facility] and once by [facility] Wound #4 Right,Posterior Lower Leg o Other: - twice weekly - once at [facility] and  once by [facility] Wound #5 Left,Proximal,Medial Lower Leg o Other: - twice weekly - once at [facility] and once by [facility] Follow-up Appointments Wound #1 Right,Medial Lower Leg o Return Appointment in 1 week. Wound #2 Left,Posterior Lower Leg Chiang, Zacari L. ([id number]) o Return Appointment in 1 week. Wound #3 Right,Proximal,Medial Lower Leg o Return Appointment in 1 week. Wound #4 Right,Posterior Lower Leg o Return Appointment in 1 week. Wound #5 Left,Proximal,Medial Lower Leg o Return Appointment in 1 week. Edema Control Wound #1 Right,Medial Lower Leg o 2 Layer Lite Compression System - Bilateral Wound #2 Left,Posterior Lower Leg o 2 Layer Lite Compression System - Bilateral Wound #3 Right,Proximal,Medial Lower Leg o 2 Layer Lite Compression System - Bilateral Wound #4 Right,Posterior Lower Leg o 2 Layer Lite Compression System - Bilateral Wound #5 Left,Proximal,Medial Lower Leg o 2 Layer Lite Compression System - Bilateral Additional Orders / Instructions Wound #1 Right,Medial Lower Leg o Increase protein intake. Wound #2 Left,Posterior Lower Leg o Increase protein intake. Wound #3 Right,Proximal,Medial Lower Leg o Increase protein intake. Wound #4 Right,Posterior Lower Leg o Increase protein intake. Wound #5 Left,Proximal,Medial Lower Leg o Increase protein intake. Home Health Wound #1 Right,Medial Lower Leg o Continue Home Health Visits - Advanced o Home Health Nurse may visit PRN to address patientos wound care needs. Mynhier, Halcyon  L. ([id number]) o FACE TO FACE ENCOUNTER: MEDICARE and MEDICAID PATIENTS: I certify that this patient is under my care and that I had a face-to-face encounter that meets the physician face-to-face encounter requirements with this patient on this date. The encounter with the patient was in whole or in part for the following MEDICAL CONDITION: (primary reason for Home Healthcare) MEDICAL  NECESSITY: I certify, that based on my findings, NURSING services are a medically necessary home health service. HOME BOUND STATUS: I certify that my clinical findings support that this patient is homebound (i.e., Due to illness or injury, pt requires aid of supportive devices such as crutches, cane, wheelchairs, walkers, the use of special transportation or the assistance of another person to leave their place of residence. There is a normal inability to leave the home and doing so requires considerable and taxing effort. Other absences are for medical reasons / religious services and are infrequent or of short duration when for other reasons). o If current dressing causes regression in wound condition, may D/C ordered dressing product/s and apply Normal Saline Moist Dressing daily until next Wound Healing Center / Other MD appointment. Notify Wound Healing Center of regression in wound condition at [phone]. o Please direct any NON-WOUND related issues/requests for orders to patient's Primary Care Physician Wound #2 Left,Posterior Lower Leg o Continue Home Health Visits - Advanced o Home Health Nurse may visit PRN to address patientos wound care needs. o FACE TO FACE ENCOUNTER: MEDICARE and MEDICAID PATIENTS: I certify that this patient is under my care and that I had a face-to-face encounter that meets the physician face-to-face encounter requirements with this patient on this date. The encounter with the patient was in whole or in part for the following MEDICAL CONDITION: (primary reason for Home Healthcare) MEDICAL NECESSITY: I certify, that based on my findings, NURSING services are a medically necessary home health service. HOME BOUND STATUS: I certify that my clinical findings support that this patient is homebound (i.e., Due to illness or injury, pt requires aid of supportive devices such as crutches, cane, wheelchairs, walkers, the use of special transportation or the  assistance of another person to leave their place of residence. There is a normal inability to leave the home and doing so requires considerable and taxing effort. Other absences are for medical reasons / religious services and are infrequent or of short duration when for other reasons). o If current dressing causes regression in wound condition, may D/C ordered dressing product/s and apply Normal Saline Moist Dressing daily until next Wound Healing Center / Other MD appointment. Notify Wound Healing Center of regression in wound condition at [phone]. o Please direct any NON-WOUND related issues/requests for orders to patient's Primary Care Physician Wound #3 Right,Proximal,Medial Lower Leg o Continue Home Health Visits - Advanced o Home Health Nurse may visit PRN to address patientos wound care needs. o FACE TO FACE ENCOUNTER: MEDICARE and MEDICAID PATIENTS: I certify that this patient is under my care and that I had a face-to-face encounter that meets the physician face-to-face encounter requirements with this patient on this date. The encounter with the patient was in whole or in part for the following MEDICAL CONDITION: (primary reason for Home Healthcare) MEDICAL NECESSITY: I certify, that based on my findings, NURSING services are a medically necessary home health service. HOME BOUND STATUS: I certify that my clinical findings support that this patient is homebound (i.e., Due to illness or injury, pt requires aid of supportive devices such as crutches, cane,  wheelchairs, walkers, the use of special [name], Albertia L. ([id number]) transportation or the assistance of another person to leave their place of residence. There is a normal inability to leave the home and doing so requires considerable and taxing effort. Other absences are for medical reasons / religious services and are infrequent or of short duration when for other reasons). o If current dressing causes  regression in wound condition, may D/C ordered dressing product/s and apply Normal Saline Moist Dressing daily until next Wound Healing Center / Other MD appointment. Notify Wound Healing Center of regression in wound condition at [phone]. o Please direct any NON-WOUND related issues/requests for orders to patient's Primary Care Physician Wound #4 Right,Posterior Lower Leg o Continue Home Health Visits - Advanced o Home Health Nurse may visit PRN to address patientos wound care needs. o FACE TO FACE ENCOUNTER: MEDICARE and MEDICAID PATIENTS: I certify that this patient is under my care and that I had a face-to-face encounter that meets the physician face-to-face encounter requirements with this patient on this date. The encounter with the patient was in whole or in part for the following MEDICAL CONDITION: (primary reason for Home Healthcare) MEDICAL NECESSITY: I certify, that based on my findings, NURSING services are a medically necessary home health service. HOME BOUND STATUS: I certify that my clinical findings support that this patient is homebound (i.e., Due to illness or injury, pt requires aid of supportive devices such as crutches, cane, wheelchairs, walkers, the use of special transportation or the assistance of another person to leave their place of residence. There is a normal inability to leave the home and doing so requires considerable and taxing effort. Other absences are for medical reasons / religious services and are infrequent or of short duration when for other reasons). o If current dressing causes regression in wound condition, may D/C ordered dressing product/s and apply Normal Saline Moist Dressing daily until next Wound Healing Center / Other MD appointment. Notify Wound Healing Center of regression in wound condition at [phone]. o Please direct any NON-WOUND related issues/requests for orders to patient's Primary Care Physician Wound #5  Left,Proximal,Medial Lower Leg o Continue Home Health Visits - Advanced o Home Health Nurse may visit PRN to address patientos wound care needs. o FACE TO FACE ENCOUNTER: MEDICARE and MEDICAID PATIENTS: I certify that this patient is under my care and that I had a face-to-face encounter that meets the physician face-to-face encounter requirements with this patient on this date. The encounter with the patient was in whole or in part for the following MEDICAL CONDITION: (primary reason for Home Healthcare) MEDICAL NECESSITY: I certify, that based on my findings, NURSING services are a medically necessary home health service. HOME BOUND STATUS: I certify that my clinical findings support that this patient is homebound (i.e., Due to illness or injury, pt requires aid of supportive devices such as crutches, cane, wheelchairs, walkers, the use of special transportation or the assistance of another person to leave their place of residence. There is a normal inability to leave the home and doing so requires considerable and taxing effort. Other absences are for medical reasons / religious services and are infrequent or of short duration when for other reasons). o If current dressing causes regression in wound condition, may D/C ordered dressing product/s and apply Normal Saline Moist Dressing daily until next Wound Healing Center / Other MD appointment. Notify Wound Healing Center of regression in wound condition at [phone]. [name], [name] ([id number]) o Please direct any NON-WOUND  related issues/requests for orders to patient's Primary Care Physician Electronic Signature(s) Signed: 05/25/2016 4:06:00 PM By: Curtis Sites Signed: 05/26/2016 9:39:47 AM By: Lenda Kelp PA-C Entered By: Curtis Sites on 05/25/2016 14:16:47 [name], [name] ([id number]) -------------------------------------------------------------------------------- Problem List Details Patient Name:  [name], [name] Date of Service: 05/25/2016 1:30 PM Medical Record Number: [id number] Patient Account Number: [id number] Date of Birth/Sex: Jan 06, 1956 (60 y.o. Female) Treating RN: Curtis Sites Primary Care Physician: Sherrie Mustache Other Clinician: Referring Physician: Sherrie Mustache Treating Physician/Extender: Linwood Dibbles, [name]: 22 Active Problems ICD-10 Encounter Code Description Active Date Diagnosis L97.211 Non-pressure chronic ulcer of right calf limited to 03/02/2016 Yes breakdown of skin I87.331 Chronic venous hypertension (idiopathic) with ulcer and 12/23/2015 Yes inflammation of right lower extremity I87.322 Chronic venous hypertension (idiopathic) with 12/23/2015 Yes inflammation of left lower extremity M32.10 Systemic lupus erythematosus, organ or system 12/23/2015 Yes involvement unspecified Inactive Problems Resolved Problems Electronic Signature(s) Signed: 05/26/2016 9:39:47 AM By: Lenda Kelp PA-C Entered By: Lenda Kelp on 05/25/2016 14:34:28 Kaney, Maria Elbert [name] ([id number]) -------------------------------------------------------------------------------- Progress Note Details Patient Name: [name], [name] Date of Service: 05/25/2016 1:30 PM Medical Record Number: [id number] Patient Account Number: [id number] Date of Birth/Sex: [birth year] (60 y.o. Female) Treating RN: Curtis Sites Primary Care Physician: Sherrie Mustache Other Clinician: Referring Physician: Sherrie Mustache Treating Physician/Extender: Linwood Dibbles, [name]: 22 Subjective Chief Complaint Information obtained from Patient Patient is here for follow-up regarding her bilateral lower extremity venous stasis ulcers History of Present Illness (HPI) 12/23/15; this is a 60 year old lady who recently relocated back to [place] from [place]. She is staying with a family friend previously lived with a daughter in [place]. 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 She is on chronic prednisone she is not a diabetic. Dose of prednisone is 10 mg. The patient tells me that roughly a week ago she developed a fairly substantial blister over this area that then ruptured. She was seen in the emergency room on 5/7 an x-ray of the leg was negative she was put on Septra although I don't think any cultures were done. She also tells me she has had chronic edema in her legs for a number of years. She had a similar presentation to currently in the right lateral lower leg roughly a year ago that she was able to heal on her own. As noted she has systemic lupus but does not have lupus nephritis according to the patient. She has a lot of edema in her lower legs. The ABI's could not be obtained. 12/31/15; the patient's insurance which is [place] makes her out of network for any home health therefore we have been changing her dressing in our facility. I reviewed her trip to the ER on 12/21/2015 her creatinine was within the normal range hemoglobin and white count normal. Culture of the wound was negative. X-ray of the leg showed soft tissue edema no foreign bodies. We have been dressing this with Aquacel Ag due to the amount of ongoing drainage 01/07/16; the patient had her arterial studies this morning I don't have these results area she comes back in to have [place] rewrap since she doesn't have access to home health 01/14/16 I still do not have the results of her arterial studies. Our nurse correctly pointed out that we've been wrapping the left leg without an open wound I think this had to do with the fact that  she had so much edema when she came in I was concerned about leaving this unwrapped. 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 We left her left leg unwrapped last week as she  has no open wound here today she has extensive edema here. We did order stockings however I believe the company called but they have not called them back. She comes back in today with extensive edema in the left leg. We have her arterial studies which showed triphasic waves and all locations tested including her posterior tibial and anterior tibial arteries bilaterally. Report listed no hemodynamically significant bilateral lower extremity arterial stenosis. She has her venous studies later this month. I'm going to rewrap the left leg due to the extent of the edema, transitioning her into the stocking we ordered last week as soon as one is available [name], [name] ([id number]) 01/27/16 the patient arrived today with pooled serosanguineous drainage over the wound it would not absorb into her Aquacel Ag which is somewhat on. In spite of this the dimensions of her wound are down considerably and the wound bed looks healthy without any need for debridement. She is obtained her juxtalite stockings the left leg which has no open wound. We are waiting for the right leg wound to get smaller before ordering her one for the right leg 02/03/16; patient wound looked improved. No debridement was required. We've been using the juxtalite stocking on the left leg which is no open wound. 02/11/16 wound is remarkably better. No debridement was required. Healthy rim of epithelialization. We have been using Aquacel Ag, we'll try to close this down with RTD over the next 2 weeks 02/18/16 wound continues to improve down a centimeter in length and width. No debridement was required. Advancing epithelialization. We started RTD last week 02/25/16: pt brought juxtalite for right lower leg today. nurse reports RTD was dry and adhered to the wound. required removal by NS. wound bed with 100% healthy red granulation tissue. denies systemic s/s of infection. 03/02/16; Considerable improvement. no debridement. 03/09/16; wound  bed is very vascular no major change in dimensions. Continuing with RTD 03/16/16; unfortunately no major change in dimensions however the wound bed looks very healthy. I will continue with RTD for one more week. If his stalls consider change to collagen. 03/30/2016 -- she's been using juxta lites on her left lower extremity but her lymphedema is very significantly increased and she is not tolerating the juxta lites the edema has increased symptoms significantly 04/06/16; apparently the RTD started sticking to the wound therefore she was changed to sorbact. Her 4-layer wrap appears to have slipped one third of the way down her leg. She was tolerating the juxtalite on the left leg, I don't see the need to wrap the left leg. She is not a candidate for external compression pumps secondary to insurance issues. 04/13/16; wound is actually measuring larger. Has been using Sorbact 04/20/16; although the wound dimensions of really not changed all that much, the better this certainly looks healthy and there is advancing epithelialization from both sides. No evidence of infection 04/27/16 changed to [facility] last week and the dimensions appear to be a lot better. No evidence of infection. She does not put the juxtalite stocking on the left leg on correctly nevertheless she has never had an open wound here. I am really uncertain whether we ever got 2  to juxtalite stockings for her. The wound started as a blister 05/04/16; finally with Hydrofera Blue last week the area on the right leg has closed over. However in inspection of her left leg she now has a small wound on the posterior aspect of the left leg. She has been using juxta lites on this area but probably not really applying them correctly and with enough tension. She is not a candidate for compression pumps secondary to insurance issues 05/11/16; the area on her right anterior leg and closed over last week however we noted an area on her left posterior  calf. I elected to wrap both her legs in anticipation we be able to transition her to juxtalite stockings. She arrives today the wraps and fallen down on both sides. The area on the right anterior leg has reopened she also had a very tense blister on the right anterior lateral leg just above her wound which I elected to excise. The area on the posterior left leg is roughly about the same 05/18/16 patient presents today for a follow-up concerning her right and left lower extremity venous stasis ulcers. Currently her wraps which are 4-layer compression have been sliding down due to the amount of swelling that she has. This is causing stasis above the wraps were really does not need to be and subsequently leading to her having blistering especially on the left above where the wrap is. In fact she has a large wound in this area that was just blistered last week and was not nearly as bad as what it is right now. Unfortunately despite what we have tried with the compression at this point I feel that we are almost being counterproductive in this respect. She tells me that her pain as a 9 out of 10 at this point in time. this pain is worse with manipulation of the wound including cleaning as well as just touching. JACKEE, [name] ([id number]) 05/25/16 patient presents today for follow-up unfortunately her bilateral lower extremities actually significantly more edematous throughout. Last visit her wraps that actually slipped down which had been the course of things of the past several visits. For that reason we actually with light of compression to hopefully make this more uniform. Unfortunately the Kerlex and Coban compression at this point in time appears to have been suboptimal. she continues to have a significant amount of swelling in bilateral lower extremities and unfortunately had several new wounds which have surfaced of the right lower extremity at this point in time. Again venous in nature. She  tells me that she does "elevate her legs" as much as she can. She notes that her legs felt tight and she relates this to be may be a 8 out of 10 at this point in time but is not having as much discomfort as last week. she is on Lasix currently 20 mg. Objective Constitutional obese but in no apparent distress. Vitals Time Taken: 1:36 PM, Height: 62 in, Weight: 170 lbs, BMI: 31.1, Temperature: 97.8 F, Pulse: 95 bpm, Respiratory Rate: 18 breaths/min, Blood Pressure: 165/87 mmHg. Respiratory normal breathing without difficulty. Cardiovascular 2+ dorsalis pedis/posterior tibialis pulses. Patient has significant bilateral lower extremity edemawith evidence of lymphedema stage III.Marland Kitchen Psychiatric this patient is able to make decisions and demonstrates good insight into disease process. Alert and Oriented x 3. pleasant and cooperative. Integumentary (Hair, Skin) Wound #1 status is Open. Original cause of wound was Blister. The wound is located on the Right,Medial Lower Leg. The wound measures 5cm length  x 5.3cm width x 0.1cm depth; 20.813cm^2 area and 2.081cm^3 volume. The wound is limited to skin breakdown. There is no tunneling or undermining noted. There is a large amount of serous drainage noted. The wound margin is flat and intact. There is large (67- 100%) red granulation within the wound bed. There is no necrotic tissue within the wound bed. The periwound skin appearance exhibited: Moist. The periwound skin appearance did not exhibit: Callus, Crepitus, Excoriation, Fluctuance, Friable, Induration, Localized Edema, Rash, Scarring, Dry/Scaly, Maceration, Atrophie Blanche, Cyanosis, Ecchymosis, Hemosiderin Staining, Mottled, Pallor, Rubor, Erythema. Periwound temperature was noted as No Abnormality. Wound #2 status is Open. Original cause of wound was Gradually Appeared. The wound is located on the [place]. ([id number]) Left,Posterior Lower Leg. The wound measures 1.3cm length x 1cm  width x 0.1cm depth; 1.021cm^2 area and 0.102cm^3 volume. The wound is limited to skin breakdown. There is no tunneling or undermining noted. There is a medium amount of serous drainage noted. The wound margin is flat and intact. There is medium (34-66%) red granulation within the wound bed. There is a medium (34-66%) amount of necrotic tissue within the wound bed including Adherent Slough. The periwound skin appearance exhibited: Induration, Moist, Ecchymosis, Hemosiderin Staining. The periwound skin appearance did not exhibit: Callus, Crepitus, Excoriation, Fluctuance, Friable, Localized Edema, Rash, Scarring, Dry/Scaly, Maceration, Atrophie Blanche, Cyanosis, Mottled, Pallor, Rubor, Erythema. Periwound temperature was noted as No Abnormality. The periwound has tenderness on palpation. Wound #3 status is Open. Original cause of wound was Blister. The wound is located on the Right,Proximal,Medial Lower Leg. The wound measures 2.8cm length x 5.6cm width x 0.1cm depth; 12.315cm^2 area and 1.232cm^3 volume. The wound is limited to skin breakdown. There is no tunneling or undermining noted. There is a large amount of sanguinous drainage noted. The wound margin is flat and intact. There is medium (34-66%) red, pink granulation within the wound bed. There is a medium (34-66%) amount of necrotic tissue within the wound bed including Adherent Slough. The periwound skin appearance did not exhibit: Callus, Crepitus, Excoriation, Fluctuance, Friable, Induration, Localized Edema, Rash, Scarring, Dry/Scaly, Maceration, Moist, Atrophie Blanche, Cyanosis, Ecchymosis, Hemosiderin Staining, Mottled, Pallor, Rubor, Erythema. The periwound has tenderness on palpation. Wound #4 status is Open. Original cause of wound was Gradually Appeared. The wound is located on the Right,Posterior Lower Leg. The wound measures 1cm length x 1.8cm width x 0.1cm depth; 1.414cm^2 area and 0.141cm^3 volume. The wound is limited to  skin breakdown. There is no tunneling or undermining noted. There is a large amount of serous drainage noted. The wound margin is flat and intact. There is large (67-100%) red, pink granulation within the wound bed. There is no necrotic tissue within the wound bed. The periwound skin appearance exhibited: Moist. The periwound skin appearance did not exhibit: Callus, Crepitus, Excoriation, Fluctuance, Friable, Induration, Localized Edema, Rash, Scarring, Dry/Scaly, Maceration, Atrophie Blanche, Cyanosis, Ecchymosis, Hemosiderin Staining, Mottled, Pallor, Rubor, Erythema. Periwound temperature was noted as No Abnormality. Wound #5 status is Open. Original cause of wound was Blister. The wound is located on the Left,Proximal,Medial Lower Leg. The wound measures 1.8cm length x 1.9cm width x 0.1cm depth; 2.686cm^2 area and 0.269cm^3 volume. The wound is limited to skin breakdown. There is no tunneling or undermining noted. There is a large amount of serous drainage noted. The wound margin is flat and intact. There is large (67-100%) pink granulation within the wound bed. There is no necrotic tissue within the wound bed. The periwound skin appearance did not exhibit:  Callus, Crepitus, Excoriation, Fluctuance, Friable, Induration, Localized Edema, Rash, Scarring, Dry/Scaly, Maceration, Moist, Atrophie Blanche, Cyanosis, Ecchymosis, Hemosiderin Staining, Mottled, Pallor, Rubor, Erythema. Periwound temperature was noted as No Abnormality. Assessment Active Problems ICD-10 L97.211 - Non-pressure chronic ulcer of right calf limited to breakdown of skin I87.331 - 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 lower extremity KAITY, PITSTICK ([id number]) I87.322 - Chronic venous hypertension (idiopathic) with inflammation of left lower extremity M32.10 - Systemic lupus erythematosus, organ or system involvement unspecified Diagnoses ICD-10 L97.211: Non-pressure chronic  ulcer of right calf limited to breakdown of skin I87.331: 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 lower extremity I87.322: Chronic venous hypertension (idiopathic) with inflammation of left lower extremity M32.10: Systemic lupus erythematosus, organ or system involvement unspecified Plan Wound Cleansing: Wound #1 Right,Medial Lower Leg: Cleanse wound with mild soap and water Wound #2 Left,Posterior Lower Leg: Cleanse wound with mild soap and water Wound #3 Right,Proximal,Medial Lower Leg: Cleanse wound with mild soap and water Wound #4 Right,Posterior Lower Leg: Cleanse wound with mild soap and water Wound #5 Left,Proximal,Medial Lower Leg: Cleanse wound with mild soap and water Skin Barriers/Peri-Wound Care: Wound #1 Right,Medial Lower Leg: Moisturizing lotion Wound #2 Left,Posterior Lower Leg: Moisturizing lotion Wound #3 Right,Proximal,Medial Lower Leg: Moisturizing lotion Wound #4 Right,Posterior Lower Leg: Moisturizing lotion Wound #5 Left,Proximal,Medial Lower Leg: Moisturizing lotion Primary Wound Dressing: Wound #1 Right,Medial Lower Leg: Sorbalgon Ag - in clinic - silver alginate for HHRN Wound #2 Left,Posterior Lower Leg: Sorbalgon Ag - in clinic - silver alginate for HHRN Wound #3 Right,Proximal,Medial Lower Leg: Sorbalgon Ag - in clinic - silver alginate for HHRN Wound #4 Right,Posterior Lower Leg: Sorbalgon Ag - in clinic - silver alginate for HHRN Wound #5 Left,Proximal,Medial Lower Leg: Sorbalgon Ag - in clinic - silver alginate for HHRN [name], [name] ([id number]) Secondary Dressing: Wound #1 Right,Medial Lower Leg: ABD pad - xtrasorb if needed for drainage Wound #2 Left,Posterior Lower Leg: ABD pad - xtrasorb if needed for drainage Wound #3 Right,Proximal,Medial Lower Leg: ABD pad - xtrasorb if needed for drainage Wound #4 Right,Posterior Lower Leg: ABD pad - xtrasorb if needed for drainage Wound #5  Left,Proximal,Medial Lower Leg: ABD pad - xtrasorb if needed for drainage Dressing Change Frequency: Wound #1 Right,Medial Lower Leg: Other: - twice weekly - once at [facility] and once by [facility] Wound #2 Left,Posterior Lower Leg: Other: - twice weekly - once at [facility] and once by [facility] Wound #3 Right,Proximal,Medial Lower Leg: Other: - twice weekly - once at [facility] and once by [facility] Wound #4 Right,Posterior Lower Leg: Other: - twice weekly - once at [facility] and once by [facility] Wound #5 Left,Proximal,Medial Lower Leg: Other: - twice weekly - once at [facility] and once by [facility] Follow-up Appointments: Wound #1 Right,Medial Lower Leg: Return Appointment in 1 week. Wound #2 Left,Posterior Lower Leg: Return Appointment in 1 week. Wound #3 Right,Proximal,Medial Lower Leg: Return Appointment in 1 week. Wound #4 Right,Posterior Lower Leg: Return Appointment in 1 week. Wound #5 Left,Proximal,Medial Lower Leg: Return Appointment in 1 week. Edema Control: Wound #1 Right,Medial Lower Leg: 2 Layer Lite Compression System - Bilateral Wound #2 Left,Posterior Lower Leg: 2 Layer Lite Compression System - Bilateral Wound #3 Right,Proximal,Medial Lower Leg: 2 Layer Lite Compression System - Bilateral Wound #4 Right,Posterior Lower Leg: 2 Layer Lite Compression System - Bilateral Wound #5 Left,Proximal,Medial Lower Leg: 2 Layer Lite Compression  System - Bilateral Additional Orders / Instructions: Wound #1 Right,Medial Lower Leg: Increase protein intake. Wound #2 Left,Posterior Lower Leg: Increase protein intake. Wound #3 Right,Proximal,Medial Lower Leg: Increase protein intake. Liane ComberCRAWFORD, Lynde L. ([id number]) Wound #4 Right,Posterior Lower Leg: Increase protein intake. Wound #5 Left,Proximal,Medial Lower Leg: Increase protein intake. Home Health: Wound #1 Right,Medial Lower Leg: Continue Home Health Visits -  [facility] Nurse may visit PRN to address patient s wound care needs. FACE TO FACE ENCOUNTER: MEDICARE and MEDICAID PATIENTS: I certify that this patient is under my care and that I had a face-to-face encounter that meets the physician face-to-face encounter requirements with this patient on this date. The encounter with the patient was in whole or in part for the following MEDICAL CONDITION: (primary reason for Home Healthcare) MEDICAL NECESSITY: I certify, that based on my findings, NURSING services are a medically necessary home health service. HOME BOUND STATUS: I certify that my clinical findings support that this patient is homebound (i.e., Due to illness or injury, pt requires aid of supportive devices such as crutches, cane, wheelchairs, walkers, the use of special transportation or the assistance of another person to leave their place of residence. There is a normal inability to leave the home and doing so requires considerable and taxing effort. Other absences are for medical reasons / religious services and are infrequent or of short duration when for other reasons). If current dressing causes regression in wound condition, may D/C ordered dressing product/s and apply Normal Saline Moist Dressing daily until next Wound Healing Center / Other MD appointment. Notify Wound Healing Center of regression in wound condition at [phone]. Please direct any NON-WOUND related issues/requests for orders to patient's Primary Care Physician Wound #2 Left,Posterior Lower Leg: Continue Home Health Visits - [facility] Nurse may visit PRN to address patient s wound care needs. FACE TO FACE ENCOUNTER: MEDICARE and MEDICAID PATIENTS: I certify that this patient is under my care and that I had a face-to-face encounter that meets the physician face-to-face encounter requirements with this patient on this date. The encounter with the patient was in whole or in part for the following  MEDICAL CONDITION: (primary reason for Home Healthcare) MEDICAL NECESSITY: I certify, that based on my findings, NURSING services are a medically necessary home health service. HOME BOUND STATUS: I certify that my clinical findings support that this patient is homebound (i.e., Due to illness or injury, pt requires aid of supportive devices such as crutches, cane, wheelchairs, walkers, the use of special transportation or the assistance of another person to leave their place of residence. There is a normal inability to leave the home and doing so requires considerable and taxing effort. Other absences are for medical reasons / religious services and are infrequent or of short duration when for other reasons). If current dressing causes regression in wound condition, may D/C ordered dressing product/s and apply Normal Saline Moist Dressing daily until next Wound Healing Center / Other MD appointment. Notify Wound Healing Center of regression in wound condition at [phone]. Please direct any NON-WOUND related issues/requests for orders to patient's Primary Care Physician Wound #3 Right,Proximal,Medial Lower Leg: Continue Home Health Visits - [facility] Nurse may visit PRN to address patient s wound care needs. FACE TO FACE ENCOUNTER: MEDICARE and MEDICAID PATIENTS: I certify that this patient is under my care and that I had a face-to-face encounter that meets the physician face-to-face encounter requirements with this patient on this date. The encounter with the  patient was in whole or in part for the following MEDICAL CONDITION: (primary reason for Home Healthcare) MEDICAL NECESSITY: I certify, that based on my findings, NURSING services are a medically necessary home health service. HOME BOUND STATUS: I certify that my clinical findings support that this patient is homebound (i.e., Due to illness or injury, pt requires aid of supportive devices such as crutches, cane, wheelchairs,  walkers, the use of special transportation or the assistance of another person to leave their place of residence. There is a normal inability to leave the home and doing so requires considerable and taxing effort. Other absences are TYAISHA, MISHKIN ([id number]) for medical reasons / religious services and are infrequent or of short duration when for other reasons). If current dressing causes regression in wound condition, may D/C ordered dressing product/s and apply Normal Saline Moist Dressing daily until next Wound Healing Center / Other MD appointment. Notify Wound Healing Center of regression in wound condition at [phone]. Please direct any NON-WOUND related issues/requests for orders to patient's Primary Care Physician Wound #4 Right,Posterior Lower Leg: Continue Home Health Visits - [facility] Nurse may visit PRN to address patient s wound care needs. FACE TO FACE ENCOUNTER: MEDICARE and MEDICAID PATIENTS: I certify that this patient is under my care and that I had a face-to-face encounter that meets the physician face-to-face encounter requirements with this patient on this date. The encounter with the patient was in whole or in part for the following MEDICAL CONDITION: (primary reason for Home Healthcare) MEDICAL NECESSITY: I certify, that based on my findings, NURSING services are a medically necessary home health service. HOME BOUND STATUS: I certify that my clinical findings support that this patient is homebound (i.e., Due to illness or injury, pt requires aid of supportive devices such as crutches, cane, wheelchairs, walkers, the use of special transportation or the assistance of another person to leave their place of residence. There is a normal inability to leave the home and doing so requires considerable and taxing effort. Other absences are for medical reasons / religious services and are infrequent or of short duration when for other reasons). If current  dressing causes regression in wound condition, may D/C ordered dressing product/s and apply Normal Saline Moist Dressing daily until next Wound Healing Center / Other MD appointment. Notify Wound Healing Center of regression in wound condition at [phone]. Please direct any NON-WOUND related issues/requests for orders to patient's Primary Care Physician Wound #5 Left,Proximal,Medial Lower Leg: Continue Home Health Visits - [facility] Nurse may visit [facility] to address patient s wound care needs. FACE TO FACE ENCOUNTER: MEDICARE and MEDICAID PATIENTS: I certify that this patient is under my care and that I had a face-to-face encounter that meets the physician face-to-face encounter requirements with this patient on this date. The encounter with the patient was in whole or in part for the following MEDICAL CONDITION: (primary reason for Home Healthcare) MEDICAL NECESSITY: I certify, that based on my findings, NURSING services are a medically necessary home health service. HOME BOUND STATUS: I certify that my clinical findings support that this patient is homebound (i.e., Due to illness or injury, pt requires aid of supportive devices such as crutches, cane, wheelchairs, walkers, the use of special transportation or the assistance of another person to leave their place of residence. There is a normal inability to leave the home and doing so requires considerable and taxing effort. Other absences are for medical reasons / religious services and are infrequent  or of short duration when for other reasons). If current dressing causes regression in wound condition, may D/C ordered dressing product/s and apply Normal Saline Moist Dressing daily until next Wound Healing Center / Other MD appointment. Notify Wound Healing Center of regression in wound condition at [phone]. Please direct any NON-WOUND related issues/requests for orders to patient's Primary Care Physician Unfortunately at this  point in time patient is have any significant amount of lower extremity swelling and edema which has not been very well controlled with various wraps that we have attempted. The 4 layer compressive wraps keep sliding down and subsequently the Kerlex and Coban wraps that we did last week do not seem to be applying enough compression. For that reason I am recommending a 2 layer Coban 2 compressive wrap at this point in time. This should give a little bit more compression than what she [place], [place] L. ([id number]) experienced over the past week which hopefully will improve her situation to a degree. with that being said I would like for her to get in touch with her primary care provider Dr. Dario Guardian for appointment for evaluation next week to see if there is anything he can do to aid in reduction of the fluid of her lower extremities bilaterally. She is on Lasix as prescribed by him currently so I'm unsure if there is anything he would suggest. I would also like to ensure that she is not having any other fluid retention complications or conditions. She tells me she does not have CHF. We will see her for a follow-up visit in one week and in the interim we will continue with the silver alginate dressings and will go with the 2 layer Kuban to compressive wrap. Electronic Signature(s) Signed: 05/26/2016 9:39:47 AM By: Lenda Kelp PA-C Entered By: Lenda Kelp on 05/25/2016 14:42:32 Bona, [name] ([id number]) -------------------------------------------------------------------------------- SuperBill Details Patient Name: Griffin, Verneda L. Date of Service: 05/25/2016 Medical Record Number: [id number] Patient Account Number: [id number] Date of Birth/Sex: 1955/10/05 (60 y.o. 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 Physician Procedures CPT4: Description Modifier Quantity Code [id number] - WC PHYS LEVEL 3 - EST PT 1 ICD-10 Description Diagnosis L97.211 Non-pressure chronic ulcer of right calf limited to breakdown of skin I87.331 Chronic venous hypertension (idiopathic) with ulcer and  inflammation of right lower extremity I87.322 Chronic venous hypertension (idiopathic) with inflammation of left lower extremity M32.10 Systemic lupus erythematosus, organ or system involvement unspecified Electronic Signature(s) Signed: 05/26/2016 9:39:47 AM By: Lenda Kelp PA-C Entered By: Lenda Kelp on 05/25/2016 14:43:05

## 2016-05-26 NOTE — Progress Notes (Signed)
[name] [name] ([id number]) Visit Report for 05/25/2016 Arrival Information Details Patient Name: [name] [name]. Date of Service: 05/25/2016 1:30 PM Medical Record Number: [id number] Patient Account Number: [id number] Date of Birth/Sex: 1956/04/09 (60 y.o. Female) Treating RN: Curtis Sites Primary Care Physician: Sherrie Mustache Other Clinician: Referring Physician: Sherrie Mustache Treating Physician/Extender: Linwood Dibbles, HOYT Weeks in Treatment: 22 Visit Information History Since Last Visit Added or deleted any medications: No Patient Arrived: Ambulatory Any new allergies or adverse reactions: No Arrival Time: 13:34 Had a fall or experienced change in No Accompanied By: self activities of daily living that may affect Transfer Assistance: None risk of falls: Patient Identification Verified: Yes Signs or symptoms of abuse/neglect since last No Secondary Verification Process Yes visito Completed: Hospitalized since last visit: No Patient Requires Transmission-Based No Pain Present Now: No Precautions: Patient Has Alerts: No Electronic Signature(s) Signed: 05/25/2016 4:06:00 PM By: Curtis Sites Entered By: Curtis Sites on 05/25/2016 13:36:25 [name] [name] Elbert Ewings ([id number]) -------------------------------------------------------------------------------- Encounter Discharge Information Details Patient Name: [name] [name] L. Date of Service: 05/25/2016 1:30 PM Medical Record Number: [id number] Patient Account Number: [id number] Date of Birth/Sex: November 23, 1955 (60 y.o. Female) Treating RN: Curtis Sites Primary Care Physician: Sherrie Mustache Other Clinician: Referring Physician: Sherrie Mustache Treating Physician/Extender: Linwood Dibbles, HOYT Weeks in Treatment: 22 Encounter Discharge Information Items Discharge Pain Level: 0 Discharge Condition: Stable Ambulatory Status: Walker Discharge Destination: Home Transportation: Private Auto Accompanied By: self Schedule  Follow-up Appointment: Yes Medication Reconciliation completed and provided to Patient/Care No Avary Eichenberger: Provided on Clinical Summary of Care: 05/25/2016 Form Type Recipient Paper Patient GC Electronic Signature(s) Signed: 05/25/2016 3:20:14 PM By: Curtis Sites Previous Signature: 05/25/2016 2:39:09 PM Version By: Gwenlyn Perking Entered By: Curtis Sites on 05/25/2016 15:20:13 [name] [name] Elbert Ewings ([id number]) -------------------------------------------------------------------------------- Lower Extremity Assessment Details Patient Name: [name] [name] L. Date of Service: 05/25/2016 1:30 PM Medical Record Number: [id number] Patient Account Number: [id number] Date of Birth/Sex: December 01, 1955 (60 y.o. Female) Treating RN: Curtis Sites Primary Care Physician: Sherrie Mustache Other Clinician: Referring Physician: Sherrie Mustache Treating Physician/Extender: Linwood Dibbles, HOYT Weeks in Treatment: 22 Edema Assessment Assessed: [Left: No] [Right: No] Edema: [Left: Yes] [Right: Yes] Calf Left: Right: Point of Measurement: 31 cm From Medial Instep 51.2 cm 54.9 cm Ankle Left: Right: Point of Measurement: 11 cm From Medial Instep 29.8 cm 28.4 cm Vascular Assessment Pulses: Posterior Tibial Dorsalis Pedis Palpable: [Left:Yes] [Right:Yes] Extremity colors, hair growth, and conditions: Extremity Color: [Left:Hyperpigmented] [Right:Hyperpigmented] Hair Growth on Extremity: [Left:No] [Right:No] Temperature of Extremity: [Left:Warm] [Right:Warm] Capillary Refill: [Left:< 3 seconds] [Right:< 3 seconds] Electronic Signature(s) Signed: 05/25/2016 4:06:00 PM By: Curtis Sites Entered By: Curtis Sites on 05/25/2016 13:52:18 [name] [name] Elbert Ewings ([id number]) -------------------------------------------------------------------------------- Multi Wound Chart Details Patient Name: [name] [name] L. Date of Service: 05/25/2016 1:30 PM Medical Record Number: [id number] Patient Account Number:  [id number] Date of Birth/Sex: 1956/05/08 (60 y.o. Female) Treating RN: Curtis Sites Primary Care Physician: Sherrie Mustache Other Clinician: Referring Physician: Sherrie Mustache Treating Physician/Extender: Linwood Dibbles, HOYT Weeks in Treatment: 22 Vital Signs Height(in): 62 Pulse(bpm): 95 Weight(lbs): 170 Blood Pressure 165/87 (mmHg): Body Mass Index(BMI): 31 Temperature(F): 97.8 Respiratory Rate 18 (breaths/min): Photos: Wound Location: Right Lower Leg - Medial Left Lower Leg - Posterior Right Lower Leg - Medial, Proximal Wounding Event: Blister Gradually Appeared Blister Primary Etiology: Venous Leg Ulcer Lymphedema Venous Leg Ulcer Date Acquired: 12/15/2015 04/19/2016 05/18/2016 Weeks of Treatment: 22 3 1  Wound Status: Open Open Open Measurements L x W x D 5x5.3x0.1 1.3x1x0.1 2.8x5.6x0.1 (cm) Area (cm) : 20.813 1.021 12.315  Volume (cm) : 2.081 0.102 1.232 % Reduction in Area: 19.70% -20.40% 40.60% % Reduction in Volume: 19.70% -20.00% 70.30% Classification: Partial Thickness Partial Thickness Full Thickness Without Exposed Support Structures Exudate Amount: Large Medium Large Exudate Type: Serous Serous Sanguinous Exudate Color: amber amber red Wound Margin: Flat and Intact Flat and Intact Flat and Intact Granulation Amount: Large (67-100%) Medium (34-66%) Medium (34-66%) Granulation Quality: Red Red Red, Pink Necrotic Amount: None Present (0%) Medium (34-66%) Medium (34-66%) [name] [name] L. ([id number]) Exposed Structures: Fascia: No Fascia: No Fascia: No Fat: No Fat: No Fat: No Tendon: No Tendon: No Tendon: No Muscle: No Muscle: No Muscle: No Joint: No Joint: No Joint: No Bone: No Bone: No Bone: No Limited to Skin Limited to Skin Limited to Skin Breakdown Breakdown Breakdown Epithelialization: Small (1-33%) Large (67-100%) None Periwound Skin Texture: Edema: No Induration: Yes Edema: No Excoriation: No Edema: No Excoriation: No Induration:  No Excoriation: No Induration: No Callus: No Callus: No Callus: No Crepitus: No Crepitus: No Crepitus: No Fluctuance: No Fluctuance: No Fluctuance: No Friable: No Friable: No Friable: No Rash: No Rash: No Rash: No Scarring: No Scarring: No Scarring: No Periwound Skin Moist: Yes Moist: Yes Maceration: No Moisture: Maceration: No Maceration: No Moist: No Dry/Scaly: No Dry/Scaly: No Dry/Scaly: No Periwound Skin Color: Atrophie Blanche: No Ecchymosis: Yes Atrophie Blanche: No Cyanosis: No Hemosiderin Staining: Yes Cyanosis: No Ecchymosis: No Atrophie Blanche: No Ecchymosis: No Erythema: No Cyanosis: No Erythema: No Hemosiderin Staining: No Erythema: No Hemosiderin Staining: No Mottled: No Mottled: No Mottled: No Pallor: No Pallor: No Pallor: No Rubor: No Rubor: No Rubor: No Temperature: No Abnormality No Abnormality N/A Tenderness on No Yes Yes Palpation: Wound Preparation: Ulcer Cleansing: Other: Ulcer Cleansing: Other: Ulcer Cleansing: Other: soap and water soap and water soap and water Topical Anesthetic Topical Anesthetic Topical Anesthetic Applied: None Applied: None Applied: None Wound Number: 4 5 N/A Photos: N/A Wound Location: Right Lower Leg - Left Lower Leg - Medial, N/A Posterior Proximal Wounding Event: Gradually Appeared Blister N/A Primary Etiology: Venous Leg Ulcer Venous Leg Ulcer N/A [name] [name] [name] [name] ([id number]) Date Acquired: 05/25/2016 05/25/2016 N/A Weeks of Treatment: 0 0 N/A Wound Status: Open Open N/A Measurements L x W x D 1x1.8x0.1 1.8x1.9x0.1 N/A (cm) Area (cm) : 1.414 2.686 N/A Volume (cm) : 0.141 0.269 N/A % Reduction in Area: N/A N/A N/A % Reduction in Volume: N/A N/A N/A Classification: Partial Thickness Partial Thickness N/A Exudate Amount: Large Large N/A Exudate Type: Serous Serous N/A Exudate Color: amber amber N/A Wound Margin: Flat and Intact Flat and Intact N/A Granulation Amount: Large  (67-100%) Large (67-100%) N/A Granulation Quality: Red, Pink Pink N/A Necrotic Amount: None Present (0%) None Present (0%) N/A Exposed Structures: Fascia: No Fascia: No N/A Fat: No Fat: No Tendon: No Tendon: No Muscle: No Muscle: No Joint: No Joint: No Bone: No Bone: No Limited to Skin Limited to Skin Breakdown Breakdown Epithelialization: Small (1-33%) Small (1-33%) N/A Periwound Skin Texture: Edema: No Edema: No N/A Excoriation: No Excoriation: No Induration: No Induration: No Callus: No Callus: No Crepitus: No Crepitus: No Fluctuance: No Fluctuance: No Friable: No Friable: No Rash: No Rash: No Scarring: No Scarring: No Periwound Skin Moist: Yes Maceration: No N/A Moisture: Maceration: No Moist: No Dry/Scaly: No Dry/Scaly: No Periwound Skin Color: Atrophie Blanche: No Atrophie Blanche: No N/A Cyanosis: No Cyanosis: No Ecchymosis: No Ecchymosis: No Erythema: No Erythema: No Hemosiderin Staining: No Hemosiderin Staining: No Mottled: No Mottled: No Pallor: No Pallor: No Rubor: No  Rubor: No Temperature: No Abnormality No Abnormality N/A Tenderness on No No N/A Palpation: Wound Preparation: N/A [name] [name] [name] [name] ([id number]) Ulcer Cleansing: Other: Ulcer Cleansing: Other: soap and water soap and water Topical Anesthetic Applied: None Treatment Notes Electronic Signature(s) Signed: 05/25/2016 4:06:00 PM By: Curtis Sites Entered By: Curtis Sites on 05/25/2016 14:15:12 [name] [name] Mallard ([id number]) -------------------------------------------------------------------------------- Multi-Disciplinary Care Plan Details Patient Name: Vari, Jaryiah L. Date of Service: 05/25/2016 1:30 PM Medical Record Number: [id number] Patient Account Number: [id number] Date of Birth/Sex: 1956-04-22 (60 y.o. Female) Treating RN: Curtis Sites Primary Care Physician: Sherrie Mustache Other Clinician: Referring Physician: Sherrie Mustache Treating  Physician/Extender: Linwood Dibbles, HOYT Weeks in Treatment: 22 Active Inactive Abuse / Safety / Falls / Self Care Management Nursing Diagnoses: Impaired physical mobility Potential for falls Goals: Patient will remain injury free Date Initiated: 12/23/2015 Goal Status: Active Interventions: Assess fall risk on admission and as needed Notes: Nutrition Nursing Diagnoses: Imbalanced nutrition Goals: Patient/caregiver verbalizes understanding of need to maintain therapeutic glucose control per primary care physician Date Initiated: 12/23/2015 Goal Status: Active Interventions: Provide education on nutrition Treatment Activities: Education provided on Nutrition : 04/20/2016 Notes: Orientation to the Wound Care Program Nursing Diagnoses: SIAH, KANNAN ([id number]) Knowledge deficit related to the wound healing center program Goals: Patient/caregiver will verbalize understanding of the Wound Healing Center Program Date Initiated: 12/23/2015 Goal Status: Active Interventions: Provide education on orientation to the wound center Notes: Venous Leg Ulcer Nursing Diagnoses: Potential for venous Insuffiency (use before diagnosis confirmed) Goals: Patient will maintain optimal edema control Date Initiated: 12/23/2015 Goal Status: Active Interventions: Provide education on venous insufficiency Notes: Wound/Skin Impairment Nursing Diagnoses: Impaired tissue integrity Goals: Ulcer/skin breakdown will heal within 14 weeks Date Initiated: 12/23/2015 Goal Status: Active Interventions: Assess ulceration(s) every visit Notes: Electronic Signature(s) Signed: 05/25/2016 4:06:00 PM By: Curtis Sites Entered By: Curtis Sites on 05/25/2016 14:14:53 Platz, Celinda Elbert Ewings ([id number]) -------------------------------------------------------------------------------- Pain Assessment Details Patient Name: Tetzlaff, Zarria L. Date of Service: 05/25/2016 1:30 PM Medical Record Number:  [id number] Patient Account Number: [id number] Date of Birth/Sex: [birth year] (60 y.o. Female) Treating RN: Curtis Sites Primary Care Physician: Sherrie Mustache Other Clinician: Referring Physician: Sherrie Mustache Treating Physician/Extender: Linwood Dibbles, HOYT Weeks in Treatment: 22 Active Problems Location of Pain Severity and Description of Pain Patient Has Paino No Site Locations Pain Management and Medication Current Pain Management: Notes Topical or injectable lidocaine is offered to patient for acute pain when surgical debridement is performed. If needed, Patient is instructed to use over the counter pain medication for the following 24-48 hours after debridement. Wound care MDs do not prescribed pain medications. Patient has chronic pain or uncontrolled pain. Patient has been instructed to make an appointment with their Primary Care Physician for pain management. Electronic Signature(s) Signed: 05/25/2016 4:06:00 PM By: Curtis Sites Entered By: Curtis Sites on 05/25/2016 13:36:32 Sulton, [name] Mallard ([id number]) -------------------------------------------------------------------------------- Patient/Caregiver Education Details Patient Name: Lounsbury, Hasana L. Date of Service: 05/25/2016 1:30 PM Medical Record Number: [id number] Patient Account Number: [id number] Date of Birth/Gender: 1956-06-30 (60 y.o. Female) Treating RN: Curtis Sites Primary Care Physician: Sherrie Mustache Other Clinician: Referring Physician: Sherrie Mustache Treating Physician/Extender: Skeet Simmer in Treatment: 22 Education Assessment Education Provided To: Patient Education Topics Provided Venous: Handouts: Other: leg elevation Methods: Explain/Verbal Responses: State content correctly Electronic Signature(s) Signed: 05/25/2016 4:06:00 PM By: Curtis Sites Entered By: Curtis Sites on 05/25/2016 15:21:26 Banko, Hampton Elbert Ewings  ([id number]) -------------------------------------------------------------------------------- Wound Assessment Details Patient Name: Belcourt, Reatha L. Date of Service: 05/25/2016 1:30 PM Medical  Record Number: [id number] Patient Account Number: [id number] Date of Birth/Sex: 12/07/1955 [age] y.o. Female) Treating RN: Curtis Sitesorthy, Joanna Primary Care Physician: Sherrie MustacheJADALI, FAYEGH Other Clinician: Referring Physician: Sherrie MustacheJADALI, FAYEGH Treating Physician/Extender: Linwood DibblesSTONE III, HOYT Weeks in Treatment: 22 Wound Status Wound Number: 1 Primary Etiology: Venous Leg Ulcer Wound Location: Right Lower Leg - Medial Wound Status: Open Wounding Event: Blister Date Acquired: 12/15/2015 Weeks Of Treatment: 22 Clustered Wound: No Photos Wound Measurements Length: (cm) 5 Width: (cm) 5.3 Depth: (cm) 0.1 Area: (cm) 20.813 Volume: (cm) 2.081 % Reduction in Area: 19.7% % Reduction in Volume: 19.7% Epithelialization: Small (1-33%) Tunneling: No Undermining: No Wound Description Classification: Partial Thickness Wound Margin: Flat and Intact Exudate Amount: Large Exudate Type: Serous Exudate Color: amber Foul Odor After Cleansing: No Wound Bed Granulation Amount: Large (67-100%) Exposed Structure Granulation Quality: Red Fascia Exposed: No Necrotic Amount: None Present (0%) Fat Layer Exposed: No Tendon Exposed: No Muscle Exposed: No [name] [name] L. ([id number]) Joint Exposed: No Bone Exposed: No Limited to Skin Breakdown Periwound Skin Texture Texture Color No Abnormalities Noted: No No Abnormalities Noted: No Callus: No Atrophie Blanche: No Crepitus: No Cyanosis: No Excoriation: No Ecchymosis: No Fluctuance: No Erythema: No Friable: No Hemosiderin Staining: No Induration: No Mottled: No Localized Edema: No Pallor: No Rash: No Rubor: No Scarring: No Temperature / Pain Moisture Temperature: No Abnormality No Abnormalities Noted: No Dry / Scaly: No Maceration: No Moist:  Yes Wound Preparation Ulcer Cleansing: Other: soap and water, Topical Anesthetic Applied: None Treatment Notes Wound #1 (Right, Medial Lower Leg) 1. Cleansed with: Cleanse wound with antibacterial soap and water 4. Dressing Applied: Other dressing (specify in notes) 5. Secondary Dressing Applied ABD Pad 7. Secured with 2 Layer Lite Compression System - Bilateral Notes sorbalgon and Armed forces operational officerxtrasorb Electronic Signature(s) Signed: 05/25/2016 4:06:00 PM By: Curtis Sitesorthy, Joanna Entered By: Curtis Sitesorthy, Joanna on 05/25/2016 14:12:46 Coltrin, Marnie Elbert EwingsL. ([id number]) -------------------------------------------------------------------------------- Wound Assessment Details Patient Name: Spadafore, Debbora L. Date of Service: 05/25/2016 1:30 PM Medical Record Number: [id number] Patient Account Number: [id number] Date of Birth/Sex: 11/14/1955 (60 y.o. Female) Treating RN: Curtis Sitesorthy, Joanna Primary Care Physician: Sherrie MustacheJADALI, FAYEGH Other Clinician: Referring Physician: Sherrie MustacheJADALI, FAYEGH Treating Physician/Extender: Linwood DibblesSTONE III, HOYT Weeks in Treatment: 22 Wound Status Wound Number: 2 Primary Etiology: Lymphedema Wound Location: Left Lower Leg - Posterior Wound Status: Open Wounding Event: Gradually Appeared Date Acquired: 04/19/2016 Weeks Of Treatment: 3 Clustered Wound: No Photos Wound Measurements Length: (cm) 1.3 Width: (cm) 1 Depth: (cm) 0.1 Area: (cm) 1.021 Volume: (cm) 0.102 % Reduction in Area: -20.4% % Reduction in Volume: -20% Epithelialization: Large (67-100%) Tunneling: No Undermining: No Wound Description Classification: Partial Thickness Wound Margin: Flat and Intact Exudate Amount: Medium Exudate Type: Serous Exudate Color: amber Wound Bed Granulation Amount: Medium (34-66%) Exposed Structure Granulation Quality: Red Fascia Exposed: No Necrotic Amount: Medium (34-66%) Fat Layer Exposed: No Necrotic Quality: Adherent Slough Tendon Exposed: No Muscle Exposed: No [name] [name]  L. ([id number]) Joint Exposed: No Bone Exposed: No Limited to Skin Breakdown Periwound Skin Texture Texture Color No Abnormalities Noted: No No Abnormalities Noted: No Callus: No Atrophie Blanche: No Crepitus: No Cyanosis: No Excoriation: No Ecchymosis: Yes Fluctuance: No Erythema: No Friable: No Hemosiderin Staining: Yes Induration: Yes Mottled: No Localized Edema: No Pallor: No Rash: No Rubor: No Scarring: No Temperature / Pain Moisture Temperature: No Abnormality No Abnormalities Noted: No Tenderness on Palpation: Yes Dry / Scaly: No Maceration: No Moist: Yes Wound Preparation Ulcer Cleansing: Other: soap and water, Topical Anesthetic Applied: None Treatment Notes Wound #2 (Left, Posterior Lower  Leg) 1. Cleansed with: Cleanse wound with antibacterial soap and water 4. Dressing Applied: Other dressing (specify in notes) 5. Secondary Dressing Applied ABD Pad 7. Secured with 2 Layer Lite Compression System - Bilateral Notes sorbalgon and Armed forces operational officer) Signed: 05/25/2016 4:06:00 PM By: Curtis Sites Entered By: Curtis Sites on 05/25/2016 14:13:14 Vey, Morgen Elbert Ewings ([id number]) -------------------------------------------------------------------------------- Wound Assessment Details Patient Name: Cislo, Barbie L. Date of Service: 05/25/2016 1:30 PM Medical Record Number: [id number] Patient Account Number: [id number] Date of Birth/Sex: 14-Dec-1955 (60 y.o. Female) Treating RN: Curtis Sites Primary Care Physician: Sherrie Mustache Other Clinician: Referring Physician: Sherrie Mustache Treating Physician/Extender: Linwood Dibbles, HOYT Weeks in Treatment: 22 Wound Status Wound Number: 3 Primary Etiology: Venous Leg Ulcer Wound Location: Right Lower Leg - Medial, Wound Status: Open Proximal Wounding Event: Blister Date Acquired: 05/18/2016 Weeks Of Treatment: 1 Clustered Wound: No Photos Wound Measurements Length: (cm) 2.8 Width: (cm)  5.6 Depth: (cm) 0.1 Area: (cm) 12.315 Volume: (cm) 1.232 % Reduction in Area: 40.6% % Reduction in Volume: 70.3% Epithelialization: None Tunneling: No Undermining: No Wound Description Full Thickness Without Exposed Classification: Support Structures Wound Margin: Flat and Intact Exudate Large Amount: Exudate Type: Sanguinous Exudate Color: red Foul Odor After Cleansing: No Wound Bed Granulation Amount: Medium (34-66%) Exposed Structure Granulation Quality: Red, Pink Fascia Exposed: No Necrotic Amount: Medium (34-66%) Fat Layer Exposed: No Lipa, Khamya L. ([id number]) Necrotic Quality: Adherent Slough Tendon Exposed: No Muscle Exposed: No Joint Exposed: No Bone Exposed: No Limited to Skin Breakdown Periwound Skin Texture Texture Color No Abnormalities Noted: No No Abnormalities Noted: No Callus: No Atrophie Blanche: No Crepitus: No Cyanosis: No Excoriation: No Ecchymosis: No Fluctuance: No Erythema: No Friable: No Hemosiderin Staining: No Induration: No Mottled: No Localized Edema: No Pallor: No Rash: No Rubor: No Scarring: No Temperature / Pain Moisture Tenderness on Palpation: Yes No Abnormalities Noted: No Dry / Scaly: No Maceration: No Moist: No Wound Preparation Ulcer Cleansing: Other: soap and water, Topical Anesthetic Applied: None Treatment Notes Wound #3 (Right, Proximal, Medial Lower Leg) 1. Cleansed with: Cleanse wound with antibacterial soap and water 4. Dressing Applied: Other dressing (specify in notes) 5. Secondary Dressing Applied ABD Pad 7. Secured with 2 Layer Lite Compression System - Bilateral Notes sorbalgon and Armed forces operational officer) Signed: 05/25/2016 4:06:00 PM By: Curtis Sites Entered By: Curtis Sites on 05/25/2016 14:13:48 Kuchera, Malie Elbert Ewings ([id number]) -------------------------------------------------------------------------------- Wound Assessment Details Patient Name: Spilde, Lemma  L. Date of Service: 05/25/2016 1:30 PM Medical Record Number: [id number] Patient Account Number: [id number] Date of Birth/Sex: 03-12-1956 (60 y.o. Female) Treating RN: Curtis Sites Primary Care Physician: Sherrie Mustache Other Clinician: Referring Physician: Sherrie Mustache Treating Physician/Extender: Linwood Dibbles, HOYT Weeks in Treatment: 22 Wound Status Wound Number: 4 Primary Etiology: Venous Leg Ulcer Wound Location: Right Lower Leg - Posterior Wound Status: Open Wounding Event: Gradually Appeared Date Acquired: 05/25/2016 Weeks Of Treatment: 0 Clustered Wound: No Photos Wound Measurements Length: (cm) 1 Width: (cm) 1.8 Depth: (cm) 0.1 Area: (cm) 1.414 Volume: (cm) 0.141 % Reduction in Area: % Reduction in Volume: Epithelialization: Small (1-33%) Tunneling: No Undermining: No Wound Description Classification: Partial Thickness Wound Margin: Flat and Intact Exudate Amount: Large Exudate Type: Serous Exudate Color: amber Foul Odor After Cleansing: No Wound Bed Granulation Amount: Large (67-100%) Exposed Structure Granulation Quality: Red, Pink Fascia Exposed: No Necrotic Amount: None Present (0%) Fat Layer Exposed: No Tendon Exposed: No Muscle Exposed: No Casler, [place]ane[place] L. ([id number]) Joint Exposed: No Bone Exposed: No Limited to Skin Breakdown Periwound Skin Texture Texture  Color No Abnormalities Noted: No No Abnormalities Noted: No Callus: No Atrophie Blanche: No Crepitus: No Cyanosis: No Excoriation: No Ecchymosis: No Fluctuance: No Erythema: No Friable: No Hemosiderin Staining: No Induration: No Mottled: No Localized Edema: No Pallor: No Rash: No Rubor: No Scarring: No Temperature / Pain Moisture Temperature: No Abnormality No Abnormalities Noted: No Dry / Scaly: No Maceration: No Moist: Yes Wound Preparation Ulcer Cleansing: Other: soap and water, Treatment Notes Wound #4 (Right, Posterior Lower Leg) 1. Cleansed with: Cleanse  wound with antibacterial soap and water 4. Dressing Applied: Other dressing (specify in notes) 5. Secondary Dressing Applied ABD Pad 7. Secured with 2 Layer Lite Compression System - Bilateral Notes sorbalgon and Armed forces operational officer) Signed: 05/25/2016 4:06:00 PM By: Curtis Sites Entered By: Curtis Sites on 05/25/2016 13:53:58 Pignato, Mischa Elbert Ewings ([id number]) -------------------------------------------------------------------------------- Wound Assessment Details Patient Name: Fiske, Brooklynne L. Date of Service: 05/25/2016 1:30 PM Medical Record Number: [id number] Patient Account Number: [id number] Date of Birth/Sex: 05-01-1956 (60 y.o. Female) Treating RN: Curtis Sites Primary Care Physician: Sherrie Mustache Other Clinician: Referring Physician: Sherrie Mustache Treating Physician/Extender: Linwood Dibbles, HOYT Weeks in Treatment: 22 Wound Status Wound Number: 5 Primary Etiology: Venous Leg Ulcer Wound Location: Left Lower Leg - Medial, Wound Status: Open Proximal Wounding Event: Blister Date Acquired: 05/25/2016 Weeks Of Treatment: 0 Clustered Wound: No Photos Wound Measurements Length: (cm) 1.8 Width: (cm) 1.9 Depth: (cm) 0.1 Area: (cm) 2.686 Volume: (cm) 0.269 % Reduction in Area: % Reduction in Volume: Epithelialization: Small (1-33%) Tunneling: No Undermining: No Wound Description Classification: Partial Thickness Wound Margin: Flat and Intact Exudate Amount: Large Exudate Type: Serous Exudate Color: amber Foul Odor After Cleansing: No Wound Bed Granulation Amount: Large (67-100%) Exposed Structure Granulation Quality: Pink Fascia Exposed: No Necrotic Amount: None Present (0%) Fat Layer Exposed: No Tendon Exposed: No Stebbins, Taron L. ([id number]) Muscle Exposed: No Joint Exposed: No Bone Exposed: No Limited to Skin Breakdown Periwound Skin Texture Texture Color No Abnormalities Noted: No No Abnormalities Noted: No Callus:  No Atrophie Blanche: No Crepitus: No Cyanosis: No Excoriation: No Ecchymosis: No Fluctuance: No Erythema: No Friable: No Hemosiderin Staining: No Induration: No Mottled: No Localized Edema: No Pallor: No Rash: No Rubor: No Scarring: No Temperature / Pain Moisture Temperature: No Abnormality No Abnormalities Noted: No Dry / Scaly: No Maceration: No Moist: No Wound Preparation Ulcer Cleansing: Other: soap and water, Topical Anesthetic Applied: None Treatment Notes Wound #5 (Left, Proximal, Medial Lower Leg) 1. Cleansed with: Cleanse wound with antibacterial soap and water 4. Dressing Applied: Other dressing (specify in notes) 5. Secondary Dressing Applied ABD Pad 7. Secured with 2 Layer Lite Compression System - Bilateral Notes sorbalgon and [employer] forces operational officer) Signed: 05/25/2016 4:06:00 PM By: Curtis Sites Entered By: Curtis Sites on 05/25/2016 13:55:24 Bundrick, [name] Mallard ([id number]) -------------------------------------------------------------------------------- Vitals Details Patient Name: Fogel, Doshia L. Date of Service: 05/25/2016 1:30 PM Medical Record Number: [id number] Patient Account Number: [id number] Date of Birth/Sex: [birth year] (60 y.o. Female) Treating RN: Curtis Sites Primary Care Physician: Sherrie Mustache Other Clinician: Referring Physician: Sherrie Mustache Treating Physician/Extender: Linwood Dibbles, HOYT Weeks in Treatment: 22 Vital Signs Time Taken: 13:36 Temperature (F): 97.8 Height (in): 62 Pulse (bpm): 95 Weight (lbs): 170 Respiratory Rate (breaths/min): 18 Body Mass Index (BMI): 31.1 Blood Pressure (mmHg): 165/87 Reference Range: 80 - 120 mg / dl Electronic Signature(s) Signed: 05/25/2016 4:06:00 PM By: Curtis Sites Entered By: Curtis Sites on 05/25/2016 13:37:14

## 2016-05-28 DIAGNOSIS — I87331 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 lower extremity: Secondary | ICD-10-CM | POA: Diagnosis not present

## 2016-05-29 NOTE — Progress Notes (Signed)
LEVETTE, PAULICK ([id number]) Visit Report for 05/28/2016 Arrival Information Details Patient Name: [name], [name] Date of Service: 05/28/2016 3:00 PM Medical Record Number: [id number] Patient Account Number: [id number] Date of Birth/Sex: 09-27-1955 (60 y.o. Female) Treating RN: Georgana Curio Primary Care Physician: Sherrie Mustache Other Clinician: Referring Physician: Sherrie Mustache Treating Physician/Extender: Eugene Garnet in Treatment: 22 Visit Information History Since Last Visit All ordered tests and consults were completed: No Patient Arrived: Ambulatory Added or deleted any medications: No Arrival Time: 15:54 Any new allergies or adverse reactions: No Accompanied By: son Had a fall or experienced change in No Transfer Assistance: None activities of daily living that may affect Patient Identification Verified: Yes risk of falls: Secondary Verification Process Yes Signs or symptoms of abuse/neglect since last No Completed: visito Patient Requires Transmission-Based No Hospitalized since last visit: No Precautions: Has Dressing in Place as Prescribed: Yes Patient Has Alerts: No Has Compression in Place as Prescribed: Yes Pain Present Now: Yes Electronic Signature(s) Signed: 05/28/2016 5:52:12 PM By: Georgana Curio Entered By: Georgana Curio on 05/28/2016 16:06:11 [name] ([id number]) -------------------------------------------------------------------------------- Wound Assessment Details Patient Name: [name], [name] Date of Service: 05/28/2016 3:00 PM Medical Record Number: [id number] Patient Account Number: [id number] Date of Birth/Sex: 11-09-1955 (60 y.o. Female) Treating RN: Georgana Curio Primary Care Physician: Sherrie Mustache Other Clinician: Referring Physician: Sherrie Mustache Treating Physician/Extender: Eugene Garnet in Treatment: 22 Wound Status Wound Number: 1 Primary Etiology: Venous Leg Ulcer Wound Location: Right  Lower Leg - Medial Wound Status: Open Wounding Event: Blister Date Acquired: 12/15/2015 Weeks Of Treatment: 22 Clustered Wound: No Wound Measurements Length: (cm) 5 Width: (cm) 5.3 Depth: (cm) 0.1 Area: (cm) 20.813 Volume: (cm) 2.081 % Reduction in Area: 19.7% % Reduction in Volume: 19.7% Epithelialization: Small (1-33%) Tunneling: No Undermining: No Wound Description Classification: Partial Thickness Wound Margin: Flat and Intact Exudate Amount: Large Exudate Type: Serous Exudate Color: amber Foul Odor After Cleansing: No Wound Bed Granulation Amount: Large (67-100%) Exposed Structure Granulation Quality: Red Fascia Exposed: No Necrotic Amount: None Present (0%) Fat Layer Exposed: No Tendon Exposed: No Muscle Exposed: No Joint Exposed: No Bone Exposed: No Limited to Skin Breakdown Periwound Skin Texture Texture Color No Abnormalities Noted: No No Abnormalities Noted: No Callus: No Atrophie Blanche: No Crepitus: No Cyanosis: No Excoriation: No Ecchymosis: No Fluctuance: No Erythema: No Russman, [name] Friable: No Hemosiderin Staining: No Induration: No Mottled: No Localized Edema: No Pallor: No Rash: No Rubor: No Scarring: No Temperature / Pain Moisture Temperature: No Abnormality No Abnormalities Noted: No Dry / Scaly: No Maceration: No Moist: Yes Wound Preparation Ulcer Cleansing: Other: soap and water, Topical Anesthetic Applied: None Treatment Notes Wound #1 (Right, Medial Lower Leg) 1. Cleansed with: Cleanse wound with antibacterial soap and water 4. Dressing Applied: Aquacel Ag 5. Secondary Dressing Applied ABD Pad 7. Secured with 2 Layer Lite Compression System - Bilateral Electronic Signature(s) Signed: 05/28/2016 5:52:12 PM By: Georgana Curio Entered By: Georgana Curio on 05/28/2016 16:37:56 [name] ([id number]) -------------------------------------------------------------------------------- Wound  Assessment Details Patient Name: [name], [name] Date of Service: 05/28/2016 3:00 PM Medical Record Number: [id number] Patient Account Number: [id number] Date of Birth/Sex: 05-16-1956 (60 y.o. Female) Treating RN: Georgana Curio Primary Care Physician: Sherrie Mustache Other Clinician: Referring Physician: Sherrie Mustache Treating Physician/Extender: Eugene Garnet in Treatment: 22 Wound Status Wound Number: 2 Primary Etiology: Lymphedema Wound Location: Left Lower Leg - Posterior Wound Status: Open Wounding Event: Gradually Appeared Date Acquired: 04/19/2016 Weeks Of Treatment: 3 Clustered Wound:  No Wound Measurements Length: (cm) 1.3 Width: (cm) 1 Depth: (cm) 0.1 Area: (cm) 1.021 Volume: (cm) 0.102 % Reduction in Area: -20.4% % Reduction in Volume: -20% Epithelialization: Large (67-100%) Tunneling: No Undermining: No Wound Description Classification: Partial Thickness Wound Margin: Flat and Intact Exudate Amount: Medium Exudate Type: Serous Exudate Color: amber Wound Bed Granulation Amount: Medium (34-66%) Exposed Structure Granulation Quality: Red Fascia Exposed: No Necrotic Amount: Medium (34-66%) Fat Layer Exposed: No Necrotic Quality: Adherent Slough Tendon Exposed: No Muscle Exposed: No Joint Exposed: No Bone Exposed: No Limited to Skin Breakdown Periwound Skin Texture Texture Color No Abnormalities Noted: No No Abnormalities Noted: No Callus: No Atrophie Blanche: No Crepitus: No Cyanosis: No Excoriation: No Ecchymosis: Yes Fluctuance: No Erythema: No [name], [name] Friable: No Hemosiderin Staining: Yes Induration: Yes Mottled: No Localized Edema: No Pallor: No Rash: No Rubor: No Scarring: No Temperature / Pain Moisture Temperature: No Abnormality No Abnormalities Noted: No Tenderness on Palpation: Yes Dry / Scaly: No Maceration: No Moist: Yes Wound Preparation Ulcer Cleansing: Other: soap and  water, Topical Anesthetic Applied: None Treatment Notes Wound #2 (Left, Posterior Lower Leg) 1. Cleansed with: Cleanse wound with antibacterial soap and water 4. Dressing Applied: Aquacel Ag 5. Secondary Dressing Applied ABD Pad 7. Secured with 2 Layer Lite Compression System - Bilateral Electronic Signature(s) Signed: 05/28/2016 5:52:12 PM By: Georgana Curio Entered By: Georgana Curio on 05/28/2016 16:38:04 Brandle, Durward [name] ([id number]) -------------------------------------------------------------------------------- Wound Assessment Details Patient Name: [name], [name] Date of Service: 05/28/2016 3:00 PM Medical Record Number: [id number] Patient Account Number: [id number] Date of Birth/Sex: 1955/10/06 (60 y.o. Female) Treating RN: Georgana Curio Primary Care Physician: Sherrie Mustache Other Clinician: Referring Physician: Sherrie Mustache Treating Physician/Extender: Eugene Garnet in Treatment: 22 Wound Status Wound Number: 3 Primary Etiology: Venous Leg Ulcer Wound Location: Right Lower Leg - Medial, Wound Status: Open Proximal Wounding Event: Blister Date Acquired: 05/18/2016 Weeks Of Treatment: 1 Clustered Wound: No Wound Measurements Length: (cm) 2.8 Width: (cm) 5.6 Depth: (cm) 0.1 Area: (cm) 12.315 Volume: (cm) 1.232 % Reduction in Area: 40.6% % Reduction in Volume: 70.3% Epithelialization: None Tunneling: No Undermining: No Wound Description Full Thickness Without Exposed Classification: Support Structures Wound Margin: Flat and Intact Exudate Large Amount: Exudate Type: Sanguinous Exudate Color: red Foul Odor After Cleansing: No Wound Bed Granulation Amount: Medium (34-66%) Exposed Structure Granulation Quality: Red, Pink Fascia Exposed: No Necrotic Amount: Medium (34-66%) Fat Layer Exposed: No Necrotic Quality: Adherent Slough Tendon Exposed: No Muscle Exposed: No Joint Exposed: No Bone Exposed: No Limited to Skin  Breakdown Periwound Skin Texture Texture Color No Abnormalities Noted: No No Abnormalities Noted: No Callus: No Atrophie Blanche: No Speich, Gwendolynn L. ([id number]) Crepitus: No Cyanosis: No Excoriation: No Ecchymosis: No Fluctuance: No Erythema: No Friable: No Hemosiderin Staining: No Induration: No Mottled: No Localized Edema: No Pallor: No Rash: No Rubor: No Scarring: No Temperature / Pain Moisture Tenderness on Palpation: Yes No Abnormalities Noted: No Dry / Scaly: No Maceration: No Moist: No Wound Preparation Ulcer Cleansing: Other: soap and water, Topical Anesthetic Applied: None Treatment Notes Wound #3 (Right, Proximal, Medial Lower Leg) 1. Cleansed with: Cleanse wound with antibacterial soap and water 4. Dressing Applied: Aquacel Ag 5. Secondary Dressing Applied ABD Pad 7. Secured with 2 Layer Lite Compression System - Bilateral Electronic Signature(s) Signed: 05/28/2016 5:52:12 PM By: Georgana Curio Entered By: Georgana Curio on 05/28/2016 16:38:17 Gazda, Durward [name] ([id number]) -------------------------------------------------------------------------------- Wound Assessment Details Patient Name: [name], [name] Date of Service: 05/28/2016 3:00 PM Medical Record  Number: [id number] Patient Account Number: [id number] Date of Birth/Sex: 03-26-1956 (60 y.o. Female) Treating RN: Georgana Curio Primary Care Physician: Sherrie Mustache Other Clinician: Referring Physician: Sherrie Mustache Treating Physician/Extender: Eugene Garnet in Treatment: 22 Wound Status Wound Number: 4 Primary Etiology: Venous Leg Ulcer Wound Location: Right Lower Leg - Posterior Wound Status: Open Wounding Event: Gradually Appeared Date Acquired: 05/25/2016 Weeks Of Treatment: 0 Clustered Wound: No Wound Measurements Length: (cm) 1 Width: (cm) 1.8 Depth: (cm) 0.1 Area: (cm) 1.414 Volume: (cm) 0.141 % Reduction in Area: 0% % Reduction in Volume:  0% Epithelialization: Small (1-33%) Tunneling: No Undermining: No Wound Description Classification: Partial Thickness Wound Margin: Flat and Intact Exudate Amount: Large Exudate Type: Serous Exudate Color: amber Foul Odor After Cleansing: No Wound Bed Granulation Amount: Large (67-100%) Exposed Structure Granulation Quality: Red, Pink Fascia Exposed: No Necrotic Amount: None Present (0%) Fat Layer Exposed: No Tendon Exposed: No Muscle Exposed: No Joint Exposed: No Bone Exposed: No Limited to Skin Breakdown Periwound Skin Texture Texture Color No Abnormalities Noted: No No Abnormalities Noted: No Callus: No Atrophie Blanche: No Crepitus: No Cyanosis: No Excoriation: No Ecchymosis: No Fluctuance: No Erythema: No Borah, Devany L. ([id number]) Friable: No Hemosiderin Staining: No Induration: No Mottled: No Localized Edema: No Pallor: No Rash: No Rubor: No Scarring: No Temperature / Pain Moisture Temperature: No Abnormality No Abnormalities Noted: No Dry / Scaly: No Maceration: No Moist: Yes Wound Preparation Ulcer Cleansing: Other: soap and water, Treatment Notes Wound #4 (Right, Posterior Lower Leg) 1. Cleansed with: Cleanse wound with antibacterial soap and water 4. Dressing Applied: Aquacel Ag 5. Secondary Dressing Applied ABD Pad 7. Secured with 2 Layer Lite Compression System - Bilateral Electronic Signature(s) Signed: 05/28/2016 5:52:12 PM By: Georgana Curio Entered By: Georgana Curio on 05/28/2016 16:38:25 Bagsby, Durward [name] ([id number]) -------------------------------------------------------------------------------- Wound Assessment Details Patient Name: [name], [name] Date of Service: 05/28/2016 3:00 PM Medical Record Number: [id number] Patient Account Number: [id number] Date of Birth/Sex: [birth year] (60 y.o. Female) Treating RN: Georgana Curio Primary Care Physician: Sherrie Mustache Other Clinician: Referring Physician: Sherrie Mustache Treating Physician/Extender: Eugene Garnet in Treatment: 22 Wound Status Wound Number: 5 Primary Etiology: Venous Leg Ulcer Wound Location: Left, Proximal, Medial Lower Wound Status: Open Leg Wounding Event: Blister Date Acquired: 05/25/2016 Weeks Of Treatment: 0 Clustered Wound: No Wound Measurements Length: (cm) 1.8 Width: (cm) 1.9 Depth: (cm) 0.1 Area: (cm) 2.686 Volume: (cm) 0.269 % Reduction in Area: 0% % Reduction in Volume: 0% Wound Description Classification: Partial Thickness Periwound Skin Texture Texture Color No Abnormalities Noted: No No Abnormalities Noted: No Moisture No Abnormalities Noted: No Treatment Notes Wound #5 (Left, Proximal, Medial Lower Leg) 1. Cleansed with: Cleanse wound with antibacterial soap and water 4. Dressing Applied: Aquacel Ag 5. Secondary Dressing Applied ABD Pad 7. Secured with 2 Layer Lite Compression System - Bilateral Electronic Signature(s) Signed: 05/28/2016 5:52:12 PM By: Claria Dice, Durward [name] ([id number]) Entered By: Georgana Curio on 05/28/2016 16:37:46

## 2016-06-01 ENCOUNTER — Encounter: Payer: Medicare Other | Admitting: Internal Medicine

## 2016-06-01 DIAGNOSIS — I87331 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 lower extremity: Secondary | ICD-10-CM | POA: Diagnosis not present

## 2016-06-02 ENCOUNTER — Emergency Department: Payer: Medicare Other

## 2016-06-02 ENCOUNTER — Observation Stay
Admission: EM | Admit: 2016-06-02 | Discharge: 2016-06-07 | Disposition: A | Discharge status: Skilled Nursing Facility | Payer: Medicare Other | Attending: Internal Medicine | Admitting: Internal Medicine

## 2016-06-02 ENCOUNTER — Encounter: Payer: Self-pay | Admitting: Medical Oncology

## 2016-06-02 DIAGNOSIS — J849 Interstitial pulmonary disease, unspecified: Secondary | ICD-10-CM | POA: Diagnosis not present

## 2016-06-02 DIAGNOSIS — Z90710 Acquired absence of both cervix and uterus: Secondary | ICD-10-CM | POA: Insufficient documentation

## 2016-06-02 DIAGNOSIS — R600 Localized edema: Secondary | ICD-10-CM | POA: Diagnosis not present

## 2016-06-02 DIAGNOSIS — S81801A Unspecified open wound, right lower leg, initial encounter: Secondary | ICD-10-CM | POA: Insufficient documentation

## 2016-06-02 DIAGNOSIS — I890 Lymphedema, not elsewhere classified: Secondary | ICD-10-CM | POA: Insufficient documentation

## 2016-06-02 DIAGNOSIS — M7989 Other specified soft tissue disorders: Secondary | ICD-10-CM | POA: Diagnosis not present

## 2016-06-02 DIAGNOSIS — Z79899 Other long term (current) drug therapy: Secondary | ICD-10-CM | POA: Insufficient documentation

## 2016-06-02 DIAGNOSIS — Z87891 Personal history of nicotine dependence: Secondary | ICD-10-CM | POA: Insufficient documentation

## 2016-06-02 DIAGNOSIS — M79671 Pain in right foot: Secondary | ICD-10-CM

## 2016-06-02 DIAGNOSIS — Z8673 Personal history of transient ischemic attack (TIA), and cerebral infarction without residual deficits: Secondary | ICD-10-CM

## 2016-06-02 DIAGNOSIS — S81802A Unspecified open wound, left lower leg, initial encounter: Secondary | ICD-10-CM | POA: Diagnosis not present

## 2016-06-02 DIAGNOSIS — B1920 Unspecified viral hepatitis C without hepatic coma: Secondary | ICD-10-CM | POA: Diagnosis not present

## 2016-06-02 DIAGNOSIS — T148XXA Other injury of unspecified body region, initial encounter: Secondary | ICD-10-CM

## 2016-06-02 DIAGNOSIS — I872 Venous insufficiency (chronic) (peripheral): Secondary | ICD-10-CM | POA: Insufficient documentation

## 2016-06-02 DIAGNOSIS — X58XXXA Exposure to other specified factors, initial encounter: Secondary | ICD-10-CM | POA: Insufficient documentation

## 2016-06-02 DIAGNOSIS — L03115 Cellulitis of right lower limb: Secondary | ICD-10-CM | POA: Insufficient documentation

## 2016-06-02 DIAGNOSIS — L03116 Cellulitis of left lower limb: Secondary | ICD-10-CM | POA: Diagnosis not present

## 2016-06-02 DIAGNOSIS — L089 Local infection of the skin and subcutaneous tissue, unspecified: Secondary | ICD-10-CM | POA: Diagnosis present

## 2016-06-02 DIAGNOSIS — M329 Systemic lupus erythematosus, unspecified: Secondary | ICD-10-CM | POA: Diagnosis not present

## 2016-06-02 DIAGNOSIS — IMO0002 Reserved for concepts with insufficient information to code with codable children: Secondary | ICD-10-CM | POA: Diagnosis present

## 2016-06-02 DIAGNOSIS — R262 Difficulty in walking, not elsewhere classified: Secondary | ICD-10-CM

## 2016-06-02 DIAGNOSIS — Z7902 Long term (current) use of antithrombotics/antiplatelets: Secondary | ICD-10-CM | POA: Diagnosis not present

## 2016-06-02 DIAGNOSIS — M6281 Muscle weakness (generalized): Secondary | ICD-10-CM

## 2016-06-02 LAB — BASIC METABOLIC PANEL
Anion gap: 8 (ref 5–15)
BUN: 18 mg/dL (ref 6–20)
CALCIUM: 8.6 mg/dL — AB (ref 8.9–10.3)
CO2: 26 mmol/L (ref 22–32)
CREATININE: 0.98 mg/dL (ref 0.44–1.00)
Chloride: 109 mmol/L (ref 101–111)
Glucose, Bld: 97 mg/dL (ref 65–99)
Potassium: 4 mmol/L (ref 3.5–5.1)
Sodium: 143 mmol/L (ref 135–145)

## 2016-06-02 LAB — SEDIMENTATION RATE: SED RATE: 14 mm/h (ref 0–30)

## 2016-06-02 LAB — CBC
HEMATOCRIT: 45.1 % (ref 35.0–47.0)
Hemoglobin: 14.7 g/dL (ref 12.0–16.0)
MCH: 31.6 pg (ref 26.0–34.0)
MCHC: 32.7 g/dL (ref 32.0–36.0)
MCV: 96.8 fL (ref 80.0–100.0)
PLATELETS: 103 10*3/uL — AB (ref 150–440)
RBC: 4.66 MIL/uL (ref 3.80–5.20)
RDW: 14.3 % (ref 11.5–14.5)
WBC: 6.1 10*3/uL (ref 3.6–11.0)

## 2016-06-02 LAB — BRAIN NATRIURETIC PEPTIDE: B Natriuretic Peptide: 55 pg/mL (ref 0.0–100.0)

## 2016-06-02 MED ORDER — HYDROXYCHLOROQUINE SULFATE 200 MG PO TABS
200.0000 mg | ORAL_TABLET | Freq: Two times a day (BID) | ORAL | Status: DC
  Administered 2016-06-03 – 2016-06-07 (×7): 200 mg via ORAL
  Filled 2016-06-02 (×8): qty 1

## 2016-06-02 MED ORDER — ENOXAPARIN SODIUM 40 MG/0.4ML ~~LOC~~ SOLN
40.0000 mg | SUBCUTANEOUS | Status: DC
  Administered 2016-06-03 – 2016-06-06 (×4): 40 mg via SUBCUTANEOUS
  Filled 2016-06-02 (×4): qty 0.4

## 2016-06-02 MED ORDER — ACETAMINOPHEN 325 MG PO TABS
650.0000 mg | ORAL_TABLET | Freq: Four times a day (QID) | ORAL | Status: DC | PRN
Start: 2016-06-02 — End: 2016-06-07

## 2016-06-02 MED ORDER — ONDANSETRON HCL 4 MG/2ML IJ SOLN
4.0000 mg | Freq: Four times a day (QID) | INTRAMUSCULAR | Status: DC | PRN
  Administered 2016-06-03 – 2016-06-06 (×2): 4 mg via INTRAVENOUS
  Filled 2016-06-02 (×2): qty 2

## 2016-06-02 MED ORDER — ACETAMINOPHEN 650 MG RE SUPP: 650.0000 mg | Freq: Four times a day (QID) | RECTAL | Status: DC | PRN

## 2016-06-02 MED ORDER — ONDANSETRON HCL 4 MG PO TABS: 4.0000 mg | ORAL_TABLET | Freq: Four times a day (QID) | ORAL | Status: DC | PRN

## 2016-06-02 MED ORDER — VANCOMYCIN HCL 10 G IV SOLR
1750.0000 mg | Freq: Once | INTRAVENOUS | Status: AC
  Administered 2016-06-02: 1750 mg via INTRAVENOUS
  Filled 2016-06-02: qty 1750

## 2016-06-02 MED ORDER — VANCOMYCIN HCL 10 G IV SOLR: 1750.0000 mg | Freq: Once | INTRAVENOUS | Status: DC

## 2016-06-02 MED ORDER — VANCOMYCIN HCL 10 G IV SOLR
1250.0000 mg | INTRAVENOUS | Status: DC
  Administered 2016-06-03: 1250 mg via INTRAVENOUS
  Filled 2016-06-02 (×2): qty 1250

## 2016-06-02 MED ORDER — CLOPIDOGREL BISULFATE 75 MG PO TABS
75.0000 mg | ORAL_TABLET | Freq: Every day | ORAL | Status: DC
  Administered 2016-06-03 – 2016-06-07 (×5): 75 mg via ORAL
  Filled 2016-06-02 (×5): qty 1

## 2016-06-02 MED ORDER — ATORVASTATIN CALCIUM 20 MG PO TABS
40.0000 mg | ORAL_TABLET | Freq: Every day | ORAL | Status: DC
  Administered 2016-06-03 – 2016-06-06 (×4): 40 mg via ORAL
  Filled 2016-06-02 (×5): qty 2

## 2016-06-02 MED ORDER — HYDROCODONE-ACETAMINOPHEN 5-325 MG PO TABS
1.0000 | ORAL_TABLET | ORAL | Status: DC | PRN
  Administered 2016-06-02 – 2016-06-07 (×6): 1 via ORAL
  Filled 2016-06-02 (×6): qty 1

## 2016-06-02 MED ORDER — FUROSEMIDE 20 MG PO TABS
20.0000 mg | ORAL_TABLET | Freq: Every day | ORAL | Status: DC
  Administered 2016-06-03 – 2016-06-07 (×5): 20 mg via ORAL
  Filled 2016-06-02 (×5): qty 1

## 2016-06-02 NOTE — Progress Notes (Signed)
Pharmacy Antibiotic Note  [name] is a 60 y.o. female admitted on 06/02/2016 with wound infection.  Pharmacy has been consulted for vancomycin dosing.  Plan: DW 67kg  Vd 47L kei 0.058 hr-1  T1/2 12 hours 1250 mg q 18 hours ordered with stacked dosing. Level before 5th dose. Goal trough 15-20.  Height: 5\' 2"  (157.5 cm) Weight: 201 lb (91.2 kg) IBW/kg (Calculated) : 50.1  Temp (24hrs), Avg:98.1 F (36.7 C), Min:97.9 F (36.6 C), Max:98.3 F (36.8 C)   Recent Labs Lab 06/02/16 1841  WBC 6.1  CREATININE 0.98    Estimated Creatinine Clearance: 63.3 mL/min (by C-G formula based on SCr of 0.98 mg/dL).    No Known Allergies  Antimicrobials this admission: vancomycin  >>    >>   Dose adjustments this admission:   Microbiology results: 10/17 BCx: pending    Thank you for allowing pharmacy to be a part of this patient's care.  Tommie Bohlken S 06/02/2016 11:07 PM

## 2016-06-02 NOTE — Progress Notes (Signed)
[name], [name] ([id number]) Visit Report for 06/01/2016 Chief Complaint Document Details [name], [name] Date of [name]: 06/01/2016 1:30 PM Patient Name: [name] Account Number: [id number] Medical Record Treating RN: Huel Coventry [id number] Number: Other Clinician: [birth year] (60 y.o. Treating ROBSON, MICHAEL Date of Birth/Sex: Female) Physician/Extender: G Primary Care [name], [facility] Physician: Referring Physician: Sherrie Mustache Weeks in Treatment: 23 Information Obtained from: Patient Chief Complaint Patient is here for follow-up regarding her bilateral lower extremity venous stasis ulcers Electronic Signature(s) Signed: 06/02/2016 8:01:59 AM By: [name] Entered By: Baltazar Najjar on 06/01/2016 14:18:29 [name], [name] ([id number]) -------------------------------------------------------------------------------- HPI Details [name]: 06/01/2016 1:30 PM Patient Name: [name] Account Number: [id number] Medical Record Treating RN: Huel Coventry [id number] Number: Other Clinician: [birth year] (60 y.o. Treating ROBSON, MICHAEL Date of Birth/Sex: Female) Physician/Extender: G Primary Care [name], [facility] Physician: Referring Physician: Sherrie Mustache Weeks in Treatment: 23 History of Present Illness HPI Description: 12/23/15; this is a 60 year old lady who recently relocated back to [place] from [place]. She is staying with a family friend previously lived with a daughter in [place]. 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 She is on chronic prednisone she is not a diabetic. Dose of prednisone is 10 mg. The patient tells me that roughly a week ago she developed a fairly substantial blister over this area that then ruptured. She was seen in the emergency room on 5/7 an x-ray of the leg was negative she was put on Septra although I don't think any cultures were done.  She also tells me she has had chronic edema in her legs for a number of years. She had a similar presentation to currently in the right lateral lower leg roughly a year ago that she was able to heal on her own. As noted she has systemic lupus but does not have lupus nephritis according to the patient. She has a lot of edema in her lower legs. The ABI's could not be obtained. 12/31/15; the patient's insurance which is [place] makes her out of network for any home health therefore we have been changing her dressing in our facility. I reviewed her trip to the ER on 12/21/2015 her creatinine was within the normal range hemoglobin and white count normal. Culture of the wound was negative. X-ray of the leg showed soft tissue edema no foreign bodies. We have been dressing this with Aquacel Ag due to the amount of ongoing drainage 01/07/16; the patient had her arterial studies this morning I don't have these results area she comes back in to have [place] rewrap since she doesn't have access to home health 01/14/16 I still do not have the results of her arterial studies. Our nurse correctly pointed out that we've been wrapping the left leg without an open wound I think this had to do with the fact that she had so much edema when she came in I was concerned about leaving this unwrapped. 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 We left her left leg unwrapped last week as she has no open wound here today she has extensive edema here. We did order stockings however I believe the company called but they have not called them back. She comes back in today with extensive edema in the left leg. We have her arterial studies  which showed triphasic waves and all locations tested including her posterior tibial and anterior tibial arteries bilaterally. Report listed no hemodynamically  significant bilateral lower extremity arterial stenosis. She has her venous studies later this month. I'm going to rewrap the left leg due to the extent of the edema, transitioning her into the stocking we ordered last week as soon as one is available 01/27/16 the patient arrived today with pooled serosanguineous drainage over the wound it would not absorb into her Aquacel Ag which is somewhat on. In spite of this the dimensions of her wound are down [name], [name] ([id number]) considerably and the wound bed looks healthy without any need for debridement. She is obtained her juxtalite stockings the left leg which has no open wound. We are waiting for the right leg wound to get smaller before ordering her one for the right leg 02/03/16; patient wound looked improved. No debridement was required. We've been using the juxtalite stocking on the left leg which is no open wound. 02/11/16 wound is remarkably better. No debridement was required. Healthy rim of epithelialization. We have been using Aquacel Ag, we'll try to close this down with RTD over the next 2 weeks 02/18/16 wound continues to improve down a centimeter in length and width. No debridement was required. Advancing epithelialization. We started RTD last week 02/25/16: pt brought juxtalite for right lower leg today. nurse reports RTD was dry and adhered to the wound. required removal by NS. wound bed with 100% healthy red granulation tissue. denies systemic s/s of infection. 03/02/16; Considerable improvement. no debridement. 03/09/16; wound bed is very vascular no major change in dimensions. Continuing with RTD 03/16/16; unfortunately no major change in dimensions however the wound bed looks very healthy. I will continue with RTD for one more week. If his stalls consider change to collagen. 03/30/2016 -- she's been using juxta lites on her left lower extremity but her lymphedema is very significantly increased and she is not tolerating the  juxta lites the edema has increased symptoms significantly 04/06/16; apparently the RTD started sticking to the wound therefore she was changed to sorbact. Her 4-layer wrap appears to have slipped one third of the way down her leg. She was tolerating the juxtalite on the left leg, I don't see the need to wrap the left leg. She is not a candidate for external compression pumps secondary to insurance issues. 04/13/16; wound is actually measuring larger. Has been using Sorbact 04/20/16; although the wound dimensions of really not changed all that much, the better this certainly looks healthy and there is advancing epithelialization from both sides. No evidence of infection 04/27/16 changed to [facility] last week and the dimensions appear to be a lot better. No evidence of infection. She does not put the juxtalite stocking on the left leg on correctly nevertheless she has never had an open wound here. I am really uncertain whether we ever got 2 to juxtalite stockings for her. The wound started as a blister 05/04/16; finally with Hydrofera Blue last week the area on the right leg has closed over. However in inspection of her left leg she now has a small wound on the posterior aspect of the left leg. She has been using juxta lites on this area but probably not really applying them correctly and with enough tension. She is not a candidate for compression pumps secondary to insurance issues 05/11/16; the area on her right anterior leg and closed over last week however we noted an area on her  left posterior calf. I elected to wrap both her legs in anticipation we be able to transition her to juxtalite stockings. She arrives today the wraps and fallen down on both sides. The area on the right anterior leg has reopened she also had a very tense blister on the right anterior lateral leg just above her wound which I elected to excise. The area on the posterior left leg is roughly about the same 05/18/16  patient presents today for a follow-up concerning her right and left lower extremity venous stasis ulcers. Currently her wraps which are 4-layer compression have been sliding down due to the amount of swelling that she has. This is causing stasis above the wraps were really does not need to be and subsequently leading to her having blistering especially on the left above where the wrap is. In fact she has a large wound in this area that was just blistered last week and was not nearly as bad as what it is right now. Unfortunately despite what we have tried with the compression at this point I feel that we are almost being counterproductive in this respect. She tells me that her pain as a 9 out of 10 at this point in time. this pain is worse with manipulation of the wound including cleaning as well as just touching. 05/25/16 patient presents today for follow-up unfortunately her bilateral lower extremities actually [name], [name] ([id number]) significantly more edematous throughout. Last visit her wraps that actually slipped down which had been the course of things of the past several visits. For that reason we actually with light of compression to hopefully make this more uniform. Unfortunately the Kerlex and Coban compression at this point in time appears to have been suboptimal. she continues to have a significant amount of swelling in bilateral lower extremities and unfortunately had several new wounds which have surfaced of the right lower extremity at this point in time. Again venous in nature. She tells me that she does "elevate her legs" as much as she can. She notes that her legs felt tight and she relates this to be may be a 8 out of 10 at this point in time but is not having as much discomfort as last week. she is on Lasix currently 20 mg. 06/01/16; apparently over the last week or 2 the patient is developed a lot more edema in her right greater than left leg. This is resulted in  substantial increase in the wounds in the right leg to a lesser extent the left leg. For reasons that are unclear she is also had reduction in the wraps she is wearing down down to a 2 layer wrap. She has a appointment with her primary physician tomorrow, currently using 40 mg of Lasix a day. The patient has a history of lupus which apparently was systemic. She also has a history of hepatitis C. Somebody has done an ultrasound with a history of a PET hepatitis C elevated LFTs. This is labeled Hepatic Elastography,. Liver is noted to have fatty infiltration no focal abnormality. Her IVC had no abnormality visualized. She did not have ascites. Chronic medical renal disease. Electronic Signature(s) Signed: 06/02/2016 8:01:59 AM By: [name] Entered By: Baltazar Najjar on 06/01/2016 14:21:20 [name], [name] ([id number]) -------------------------------------------------------------------------------- Physical Exam Details [name]: 06/01/2016 1:30 PM Patient Name: [name] Account Number: [id number] Medical Record Treating RN: Huel Coventry [id number] Number: Other Clinician: 11-Nov-1955 (60 y.o. Treating ROBSON, MICHAEL Date of Birth/Sex: Female) Physician/Extender: Reece Agar  Primary Care [name], [facility] Physician: Referring Physician: Sherrie Mustache Weeks in Treatment: 64 Constitutional Patient is hypertensive.. Pulse regular and within target range for patient.Marland Kitchen Respirations regular, non-labored and within target range.. Temperature is normal and within the target range for the patient.. Patient's appearance is neat and clean. Appears in no acute distress. Well nourished and well developed.. Eyes Conjunctivae clear. No discharge.Marland Kitchen Respiratory Respiratory effort is easy and symmetric bilaterally. Rate is normal at rest and on room air.. Bilateral breath sounds are clear and equal in all lobes with no wheezes, rales or rhonchi.. Cardiovascular Heart sounds are  normal no S3 no elevated JVP. Gastrointestinal (GI) Abdomen is soft and non-distended without masses or tenderness. Bowel sounds active in all quadrants.. No liver or spleen enlargement or tenderness.Marland Kitchen Psychiatric No evidence of depression, anxiety, or agitation. Calm, cooperative, and communicative. Appropriate interactions and affect.. Notes Wound exam; worsening extensive but superficial wounds on the right leg also the medial left leg. There is pitting edema on the right leg without evidence of cellulitis. This does not extend above the knee on either side that I can tell. No doubts that she has a combination of chronic venous insufficiency as well as lymphedema. The cause of the weeping coming out of the right leg particularly and the worsening of this over the last week or 2 is not really clear to me. There is no evidence that she is systemically fluid overloaded. Electronic Signature(s) Signed: 06/02/2016 8:01:59 AM By: [name] Entered By: Baltazar Najjar on 06/01/2016 14:23:22 [name] ([id number]) -------------------------------------------------------------------------------- Physician Orders Details [name]: 06/01/2016 1:30 PM Patient Name: [name] Account Number: [id number] Medical Record Treating RN: Huel Coventry [id number] Number: Other Clinician: [birth year] (60 y.o. Treating ROBSON, MICHAEL Date of Birth/Sex: Female) Physician/Extender: G Primary Care [name], FAYEGH Physician: Referring Physician: Sherrie Mustache Weeks in Treatment: 20 Verbal / Phone Orders: Yes Clinician: Huel Coventry Read Back and Verified: Yes Diagnosis Coding ICD-10 Coding Code Description L97.211 Non-pressure chronic ulcer of right calf limited to breakdown of skin 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 lower I87.331 extremity I87.322 Chronic venous hypertension (idiopathic) with inflammation of left lower  extremity M32.10 Systemic lupus erythematosus, organ or system involvement unspecified I89.0 Lymphedema, not elsewhere classified Wound Cleansing Wound #1 Right,Medial Lower Leg o Cleanse wound with mild soap and water Primary Wound Dressing Wound #1 Right,Medial Lower Leg o Sorbalgon Ag - in clinic - silver alginate for [facility] Wound #2 Left,Posterior Lower Leg o Sorbalgon Ag - in clinic - silver alginate for HHRN Wound #3 Right,Proximal,Medial Lower Leg o Sorbalgon Ag - in clinic - silver alginate for [facility] Wound #4 Right,Posterior Lower Leg o Sorbalgon Ag - in clinic - silver alginate for HHRN Wound #5 Left,Proximal,Medial Lower Leg o Sorbalgon Ag - in clinic - silver alginate for [facility] Secondary Dressing Wound #1 Right,Medial Lower Leg o ABD pad - xtrasorb if needed for drainage ZYIAH, [name] ([id number]) Wound #2 Left,Posterior Lower Leg o ABD pad - xtrasorb if needed for drainage Wound #3 Right,Proximal,Medial Lower Leg o ABD pad - xtrasorb if needed for drainage Wound #4 Right,Posterior Lower Leg o ABD pad - xtrasorb if needed for drainage Wound #5 Left,Proximal,Medial Lower Leg o ABD pad - xtrasorb if needed for drainage Dressing Change Frequency Wound #1 Right,Medial Lower Leg o Change Dressing Monday, Wednesday, Friday Wound #2 Left,Posterior Lower Leg o Change Dressing Monday, Wednesday, Friday Wound #3 Right,Proximal,Medial Lower Leg o Change Dressing Monday, Wednesday, Friday  Wound #4 Right,Posterior Lower Leg o Change Dressing Monday, Wednesday, Friday Wound #5 Left,Proximal,Medial Lower Leg o Change Dressing Monday, Wednesday, Friday Follow-up Appointments Wound #1 Right,Medial Lower Leg o Return Appointment in 1 week. Wound #2 Left,Posterior Lower Leg o Return Appointment in 1 week. Wound #3 Right,Proximal,Medial Lower Leg o Return Appointment in 1 week. Wound #4 Right,Posterior Lower Leg o Return Appointment in 1  week. Wound #5 Left,Proximal,Medial Lower Leg o Return Appointment in 1 week. Edema Control Wound #1 Right,Medial Lower Leg o 4 Layer Compression System - Bilateral [name], [name] ([id number]) Wound #2 Left,Posterior Lower Leg o 4 Layer Compression System - Bilateral Wound #3 Right,Proximal,Medial Lower Leg o 4 Layer Compression System - Bilateral Wound #4 Right,Posterior Lower Leg o 4 Layer Compression System - Bilateral Wound #5 Left,Proximal,Medial Lower Leg o 4 Layer Compression System - Bilateral Additional Orders / Instructions Wound #1 Right,Medial Lower Leg o Increase protein intake. Wound #2 Left,Posterior Lower Leg o Increase protein intake. Wound #3 Right,Proximal,Medial Lower Leg o Increase protein intake. Wound #4 Right,Posterior Lower Leg o Increase protein intake. Wound #5 Left,Proximal,Medial Lower Leg o Increase protein intake. Home Health Wound #1 Right,Medial Lower Leg o Continue Home Health Visits - Advanced o Home Health Nurse may visit PRN to address patientos wound care needs. o FACE TO FACE ENCOUNTER: MEDICARE and MEDICAID PATIENTS: I certify that this patient is under my care and that I had a face-to-face encounter that meets the physician face-to-face encounter requirements with this patient on this date. The encounter with the patient was in whole or in part for the following MEDICAL CONDITION: (primary reason for Home Healthcare) MEDICAL NECESSITY: I certify, that based on my findings, NURSING services are a medically necessary home health [name]. HOME BOUND STATUS: I certify that my clinical findings support that this patient is homebound (i.e., Due to illness or injury, pt requires aid of supportive devices such as crutches, cane, wheelchairs, walkers, the use of special transportation or the assistance of another person to leave their place of residence. There is a normal inability to leave the home and doing so  requires considerable and taxing effort. Other absences are for medical reasons / religious services and are infrequent or of short duration when for other reasons). o If current dressing causes regression in wound condition, may D/C ordered dressing product/s and apply Normal Saline Moist Dressing daily until next Wound Healing Center / Other MD appointment. Notify Wound Healing Center of regression in wound condition at [phone]. [name], [name] ([id number]) o Please direct any NON-WOUND related issues/requests for orders to patient's Primary Care Physician Wound #2 Left,Posterior Lower Leg o Continue Home Health Visits - Advanced o Home Health Nurse may visit PRN to address patientos wound care needs. o FACE TO FACE ENCOUNTER: MEDICARE and MEDICAID PATIENTS: I certify that this patient is under my care and that I had a face-to-face encounter that meets the physician face-to-face encounter requirements with this patient on this date. The encounter with the patient was in whole or in part for the following MEDICAL CONDITION: (primary reason for Home Healthcare) MEDICAL NECESSITY: I certify, that based on my findings, NURSING services are a medically necessary home health [name]. HOME BOUND STATUS: I certify that my clinical findings support that this patient is homebound (i.e., Due to illness or injury, pt requires aid of supportive devices such as crutches, cane, wheelchairs, walkers, the use of special transportation or the assistance of another person to leave their place of residence. There is  a normal inability to leave the home and doing so requires considerable and taxing effort. Other absences are for medical reasons / religious services and are infrequent or of short duration when for other reasons). o If current dressing causes regression in wound condition, may D/C ordered dressing product/s and apply Normal Saline Moist Dressing daily until next Wound Healing  Center / Other MD appointment. Notify Wound Healing Center of regression in wound condition at [phone]. o Please direct any NON-WOUND related issues/requests for orders to patient's Primary Care Physician Wound #3 Right,Proximal,Medial Lower Leg o Continue Home Health Visits - Advanced o Home Health Nurse may visit PRN to address patientos wound care needs. o FACE TO FACE ENCOUNTER: MEDICARE and MEDICAID PATIENTS: I certify that this patient is under my care and that I had a face-to-face encounter that meets the physician face-to-face encounter requirements with this patient on this date. The encounter with the patient was in whole or in part for the following MEDICAL CONDITION: (primary reason for Home Healthcare) MEDICAL NECESSITY: I certify, that based on my findings, NURSING services are a medically necessary home health [name]. HOME BOUND STATUS: I certify that my clinical findings support that this patient is homebound (i.e., Due to illness or injury, pt requires aid of supportive devices such as crutches, cane, wheelchairs, walkers, the use of special transportation or the assistance of another person to leave their place of residence. There is a normal inability to leave the home and doing so requires considerable and taxing effort. Other absences are for medical reasons / religious services and are infrequent or of short duration when for other reasons). o If current dressing causes regression in wound condition, may D/C ordered dressing product/s and apply Normal Saline Moist Dressing daily until next Wound Healing Center / Other MD appointment. Notify Wound Healing Center of regression in wound condition at [phone]. o Please direct any NON-WOUND related issues/requests for orders to patient's Primary Care Physician Wound #4 Right,Posterior Lower Leg o Continue Home Health Visits - Advanced o Home Health Nurse may visit PRN to address patientos wound  care needs. o FACE TO FACE ENCOUNTER: MEDICARE and MEDICAID PATIENTS: I certify that this patient is under my care and that I had a face-to-face encounter that meets the physician face-to-face LEEANDRA, ELLERSON ([id number]) encounter requirements with this patient on this date. The encounter with the patient was in whole or in part for the following MEDICAL CONDITION: (primary reason for Home Healthcare) MEDICAL NECESSITY: I certify, that based on my findings, NURSING services are a medically necessary home health [name]. HOME BOUND STATUS: I certify that my clinical findings support that this patient is homebound (i.e., Due to illness or injury, pt requires aid of supportive devices such as crutches, cane, wheelchairs, walkers, the use of special transportation or the assistance of another person to leave their place of residence. There is a normal inability to leave the home and doing so requires considerable and taxing effort. Other absences are for medical reasons / religious services and are infrequent or of short duration when for other reasons). o If current dressing causes regression in wound condition, may D/C ordered dressing product/s and apply Normal Saline Moist Dressing daily until next Wound Healing Center / Other MD appointment. Notify Wound Healing Center of regression in wound condition at [phone]. o Please direct any NON-WOUND related issues/requests for orders to patient's Primary Care Physician Wound #5 Left,Proximal,Medial Lower Leg o Continue Home Health Visits - Advanced o Home Health Nurse  may visit PRN to address patientos wound care needs. o FACE TO FACE ENCOUNTER: MEDICARE and MEDICAID PATIENTS: I certify that this patient is under my care and that I had a face-to-face encounter that meets the physician face-to-face encounter requirements with this patient on this date. The encounter with the patient was in whole or in part for the following  MEDICAL CONDITION: (primary reason for Home Healthcare) MEDICAL NECESSITY: I certify, that based on my findings, NURSING services are a medically necessary home health [name]. HOME BOUND STATUS: I certify that my clinical findings support that this patient is homebound (i.e., Due to illness or injury, pt requires aid of supportive devices such as crutches, cane, wheelchairs, walkers, the use of special transportation or the assistance of another person to leave their place of residence. There is a normal inability to leave the home and doing so requires considerable and taxing effort. Other absences are for medical reasons / religious services and are infrequent or of short duration when for other reasons). o If current dressing causes regression in wound condition, may D/C ordered dressing product/s and apply Normal Saline Moist Dressing daily until next [facility]aling [facility] / Other MD appointment. Notify W[facility]aling [facility] of regression in wound condition at [phone]. o Please direct any NON-WOUND related issues/requests for orders to patient's Primary Care Physician Electronic Signature(s) Signed: 06/01/2016 5:35:41 PM By: Elliot Gurney RN, [name], [name], [name] Signed: 06/02/2016 8:01:59 AM By: [name] Entered By: Elliot Gurney, RN, [name], Kim on 06/01/2016 17:31:07 Kawai, [name] ([id number]) -------------------------------------------------------------------------------- Problem List Details [name]: 06/01/2016 1:30 PM Patient Name: [name] Account Number: [id number] Medical Record Treating RN: Huel Coventry [id number] Number: Other Clinician: 1955/09/05 (60 y.o. Treating ROBSON, MICHAEL Date of Birth/Sex: Female) Physician/Extender: G Primary Care [name], [facility] Physician: Referring Physician: Sherrie Mustache Weeks in Treatment: 23 Active Problems ICD-10 Encounter Code Description Active Date Diagnosis L97.211 Non-pressure chronic ulcer of right  calf limited to 03/02/2016 Yes breakdown of skin I87.331 Chronic venous hypertension (idiopathic) with ulcer and 12/23/2015 Yes inflammation of right lower extremity I87.322 Chronic venous hypertension (idiopathic) with 12/23/2015 Yes inflammation of left lower extremity M32.10 Systemic lupus erythematosus, organ or system 12/23/2015 Yes involvement unspecified I89.0 Lymphedema, not elsewhere classified 06/01/2016 Yes Inactive Problems Resolved Problems Electronic Signature(s) Signed: 06/02/2016 8:01:59 AM By: [name] Entered By: Baltazar Najjar on 06/01/2016 14:17:57 [name], [name] ([id number]) -------------------------------------------------------------------------------- Progress Note Details [name], [name] Date of [name]: 06/01/2016 1:30 PM Patient Name: [name] Account Number: [id number] Medical Record Treating RN: Huel Coventry [id number] Number: Other Clinician: 12/04/1955 (60 y.o. Treating ROBSON, MICHAEL Date of Birth/Sex: Female) Physician/Extender: G Primary Care [name], [facility] Physician: Referring Physician: Sherrie Mustache Weeks in Treatment: 23 Subjective Chief Complaint Information obtained from Patient Patient is here for follow-up regarding her bilateral lower extremity venous stasis ulcers History of Present Illness (HPI) 12/23/15; this is a 60 year old lady who recently relocated back to [place] from [place]. She is staying with a family friend previously lived with a daughter in [place]. 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 She is on chronic prednisone she is not a diabetic. Dose of prednisone is 10 mg. The patient tells me that roughly a week ago she developed a fairly substantial blister over this area that then ruptured. She was seen in the emergency room on 5/7 an x-ray of the leg was negative she was put on Septra although I don't  think any cultures were done. She also  tells me she has had chronic edema in her legs for a number of years. She had a similar presentation to currently in the right lateral lower leg roughly a year ago that she was able to heal on her own. As noted she has systemic lupus but does not have lupus nephritis according to the patient. She has a lot of edema in her lower legs. The ABI's could not be obtained. 12/31/15; the patient's insurance which is [place] makes her out of network for any home health therefore we have been changing her dressing in our facility. I reviewed her trip to the ER on 12/21/2015 her creatinine was within the normal range hemoglobin and white count normal. Culture of the wound was negative. X-ray of the leg showed soft tissue edema no foreign bodies. We have been dressing this with Aquacel Ag due to the amount of ongoing drainage 01/07/16; the patient had her arterial studies this morning I don't have these results area she comes back in to have [place] rewrap since she doesn't have access to home health 01/14/16 I still do not have the results of her arterial studies. Our nurse correctly pointed out that we've been wrapping the left leg without an open wound I think this had to do with the fact that she had so much edema when she came in I was concerned about leaving this unwrapped. 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 We left her left leg unwrapped last week as she has no open wound here today she has extensive edema here. We did order stockings however I believe the company called but they have not called them back. She comes back in today with extensive edema in the left leg. We have her arterial studies which Harmsen, Dae L. ([id number]) showed triphasic waves and all locations tested including her posterior tibial and anterior tibial arteries bilaterally. Report listed no  hemodynamically significant bilateral lower extremity arterial stenosis. She has her venous studies later this month. I'm going to rewrap the left leg due to the extent of the edema, transitioning her into the stocking we ordered last week as soon as one is available 01/27/16 the patient arrived today with pooled serosanguineous drainage over the wound it would not absorb into her Aquacel Ag which is somewhat on. In spite of this the dimensions of her wound are down considerably and the wound bed looks healthy without any need for debridement. She is obtained her juxtalite stockings the left leg which has no open wound. We are waiting for the right leg wound to get smaller before ordering her one for the right leg 02/03/16; patient wound looked improved. No debridement was required. We've been using the juxtalite stocking on the left leg which is no open wound. 02/11/16 wound is remarkably better. No debridement was required. Healthy rim of epithelialization. We have been using Aquacel Ag, we'll try to close this down with RTD over the next 2 weeks 02/18/16 wound continues to improve down a centimeter in length and width. No debridement was required. Advancing epithelialization. We started RTD last week 02/25/16: pt brought juxtalite for right lower leg today. nurse reports RTD was dry and adhered to the wound. required removal by NS. wound bed with 100% healthy red granulation tissue. denies systemic s/s of infection. 03/02/16; Considerable improvement. no debridement. 03/09/16;  wound bed is very vascular no major change in dimensions. Continuing with RTD 03/16/16; unfortunately no major change in dimensions however the wound bed looks very healthy. I will continue with RTD for one more week. If his stalls consider change to collagen. 03/30/2016 -- she's been using juxta lites on her left lower extremity but her lymphedema is very significantly increased and she is not tolerating the juxta lites the  edema has increased symptoms significantly 04/06/16; apparently the RTD started sticking to the wound therefore she was changed to sorbact. Her 4-layer wrap appears to have slipped one third of the way down her leg. She was tolerating the juxtalite on the left leg, I don't see the need to wrap the left leg. She is not a candidate for external compression pumps secondary to insurance issues. 04/13/16; wound is actually measuring larger. Has been using Sorbact 04/20/16; although the wound dimensions of really not changed all that much, the better this certainly looks healthy and there is advancing epithelialization from both sides. No evidence of infection 04/27/16 changed to [facility] last week and the dimensions appear to be a lot better. No evidence of infection. She does not put the juxtalite stocking on the left leg on correctly nevertheless she has never had an open wound here. I am really uncertain whether we ever got 2 to juxtalite stockings for her. The wound started as a blister 05/04/16; finally with Hydrofera Blue last week the area on the right leg has closed over. However in inspection of her left leg she now has a small wound on the posterior aspect of the left leg. She has been using juxta lites on this area but probably not really applying them correctly and with enough tension. She is not a candidate for compression pumps secondary to insurance issues 05/11/16; the area on her right anterior leg and closed over last week however we noted an area on her left posterior calf. I elected to wrap both her legs in anticipation we be able to transition her to juxtalite stockings. She arrives today the wraps and fallen down on both sides. The area on the right anterior leg has reopened she also had a very tense blister on the right anterior lateral leg just above her wound which I elected to excise. The area on the posterior left leg is roughly about the same 05/18/16 patient presents today  for a follow-up concerning her right and left lower extremity venous stasis ulcers. Currently her wraps which are 4-layer compression have been sliding down due to the amount of swelling that she has. This is causing stasis above the wraps were really does not need to be and subsequently leading to her having blistering especially on the left above where the wrap is. In fact she has [name], Brayla L. ([id number]) a large wound in this area that was just blistered last week and was not nearly as bad as what it is right now. Unfortunately despite what we have tried with the compression at this point I feel that we are almost being counterproductive in this respect. She tells me that her pain as a 9 out of 10 at this point in time. this pain is worse with manipulation of the wound including cleaning as well as just touching. 05/25/16 patient presents today for follow-up unfortunately her bilateral lower extremities actually significantly more edematous throughout. Last visit her wraps that actually slipped down which had been the course of things of the past several visits. For that reason  we actually with light of compression to hopefully make this more uniform. Unfortunately the Kerlex and Coban compression at this point in time appears to have been suboptimal. she continues to have a significant amount of swelling in bilateral lower extremities and unfortunately had several new wounds which have surfaced of the right lower extremity at this point in time. Again venous in nature. She tells me that she does "elevate her legs" as much as she can. She notes that her legs felt tight and she relates this to be may be a 8 out of 10 at this point in time but is not having as much discomfort as last week. she is on Lasix currently 20 mg. 06/01/16; apparently over the last week or 2 the patient is developed a lot more edema in her right greater than left leg. This is resulted in substantial increase in the  wounds in the right leg to a lesser extent the left leg. For reasons that are unclear she is also had reduction in the wraps she is wearing down down to a 2 layer wrap. She has a appointment with her primary physician tomorrow, currently using 40 mg of Lasix a day. The patient has a history of lupus which apparently was systemic. She also has a history of hepatitis C. Somebody has done an ultrasound with a history of a PET hepatitis C elevated LFTs. This is labeled Hepatic Elastography,. Liver is noted to have fatty infiltration no focal abnormality. Her IVC had no abnormality visualized. She did not have ascites. Chronic medical renal disease. Objective Constitutional Patient is hypertensive.. Pulse regular and within target range for patient.Marland Kitchen Respirations regular, non-labored and within target range.. Temperature is normal and within the target range for the patient.. Patient's appearance is neat and clean. Appears in no acute distress. Well nourished and well developed.. Vitals Time Taken: 1:47 PM, Height: 62 in, Weight: 170 lbs, BMI: 31.1, Temperature: 98.0 F, Pulse: 98 bpm, Respiratory Rate: 16 breaths/min, Blood Pressure: 155/90 mmHg. Eyes Conjunctivae clear. No discharge.Marland Kitchen Respiratory Respiratory effort is easy and symmetric bilaterally. Rate is normal at rest and on room air.. Bilateral breath sounds are clear and equal in all lobes with no wheezes, rales or rhonchi.. Cardiovascular Heart sounds are normal no S3 no elevated JVP. Gastrointestinal (GI) [name], [name] ([id number]) Abdomen is soft and non-distended without masses or tenderness. Bowel sounds active in all quadrants.. No liver or spleen enlargement or tenderness.Marland Kitchen Psychiatric No evidence of depression, anxiety, or agitation. Calm, cooperative, and communicative. Appropriate interactions and affect.. General Notes: Wound exam; worsening extensive but superficial wounds on the right leg also the medial left leg.  There is pitting edema on the right leg without evidence of cellulitis. This does not extend above the knee on either side that I can tell. No doubts that she has a combination of chronic venous insufficiency as well as lymphedema. The cause of the weeping coming out of the right leg particularly and the worsening of this over the last week or 2 is not really clear to me. There is no evidence that she is systemically fluid overloaded. Integumentary (Hair, Skin) Wound #1 status is Open. Original cause of wound was Blister. The wound is located on the Right,Medial Lower Leg. The wound measures 8cm length x 12cm width x 0.1cm depth; 75.398cm^2 area and 7.54cm^3 volume. Wound #2 status is Open. Original cause of wound was Gradually Appeared. The wound is located on the Left,Posterior Lower Leg. The wound measures 11cm length x 7cm  width x 0.1cm depth; 60.476cm^2 area and 6.048cm^3 volume. Wound #3 status is Open. Original cause of wound was Blister. The wound is located on the Right,Proximal,Medial Lower Leg. The wound measures 3cm length x 5.4cm width x 0.1cm depth; 12.723cm^2 area and 1.272cm^3 volume. Wound #4 status is Open. Original cause of wound was Gradually Appeared. The wound is located on the Right,Posterior Lower Leg. The wound measures 2cm length x 1.5cm width x 0.1cm depth; 2.356cm^2 area and 0.236cm^3 volume. Wound #5 status is Open. Original cause of wound was Blister. The wound is located on the Left,Proximal,Medial Lower Leg. The wound measures 2cm length x 3.5cm width x 0.1cm depth; 5.498cm^2 area and 0.55cm^3 volume. Assessment Active Problems ICD-10 L97.211 - Non-pressure chronic ulcer of right calf limited to breakdown of skin I87.331 - 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 lower extremity I87.322 - Chronic venous hypertension (idiopathic) with inflammation of left lower extremity M32.10 - Systemic lupus erythematosus, organ or  system involvement unspecified I89.0 - Lymphedema, not elsewhere classified Hendon, Tomesha L. ([id number]) Plan o #1 in terms a actual wound care there is little else we can do except for absorbent dressings such as calcium alginate, drawtex, AVD pads and she clearly needs more compression not less therefore will increase her back to Profore wraps up. I think once we put some of the swelling out of her leg this should help with the weeping edema. #2 as to why she has this increasing edema in the first place I am not really sure. She will see her primary physician tomorrow and hopefully here she will increase the Lasix which should help. There doesn't seem to be a lot of evidence of systemic fluid overload. In particular she doesn't appear to be in congestive heart failure. She does have hepatitis C with elevated liver function tests although her recent abdominal ultrasound on 05/26/16 doesn't really give a good explanation for any increasing lower extremity edema. Particular she does not have ascites or evidence of IVC compression. She probably should be checked for a protein losing enteropathy with a spot urine for protein or a protein to creatinine ratio 3) see her back later in the week for wrap change Electronic Signature(s) Signed: 06/02/2016 8:01:59 AM By: Baltazar Najjarobson, Michael MD Entered By: Baltazar Najjarobson, Michael on 06/01/2016 14:26:22 Thies, [name] MallardGLORIA L. ([id number]) -------------------------------------------------------------------------------- SuperBill Details Sabic, Melisia Date of [name]: 06/01/2016 Patient Name: [name] Account Number: [id number] Medical Record Treating RN: Huel CoventryWoody, Kim [id number] Number: Other Clinician: 05/22/1956 (60 y.o. Treating ROBSON, MICHAEL Date of Birth/Sex: Female) Physician/Extender: G Primary Care Weeks in Treatment: 5423 [name], FAYEGH Physician: Referring Physician: Sherrie MustacheJADALI, FAYEGH Diagnosis Coding ICD-10 Codes Code Description [phone]  Non-pressure chronic ulcer of right calf limited to breakdown of skin 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 lower I87.331 extremity I87.322 Chronic venous hypertension (idiopathic) with inflammation of left lower extremity M32.10 Systemic lupus erythematosus, organ or system involvement unspecified I89.0 Lymphedema, not elsewhere classified Facility Procedures CPT4: Description Modifier Quantity Code [id number] BILATERAL: Application of multi-layer venous compression 1 system; leg (below knee), including ankle and foot. Physician Procedures CPT4: Description Modifier Quantity Code [id number] 99214 - WC PHYS LEVEL 4 - EST PT 1 ICD-10 Description Diagnosis I87.331 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 lower extremity Electronic Signature(s) Signed: 06/01/2016 5:35:41 PM By: Elliot GurneyWoody, RN, [name], [name], [name] Signed: 06/02/2016 8:01:59 AM By: Baltazar Najjarobson, Michael MD Entered By: Elliot GurneyWoody, RN, [name], Kim on 06/01/2016 17:31:34

## 2016-06-02 NOTE — H&P (Signed)
[facility] [place] at [facility]   PATIENT NAME: [name]    MR#:  [id number]  DATE OF BIRTH:  [birth year]  DATE OF ADMISSION:  06/02/2016  PRIMARY CARE PHYSICIAN: Sherrie Mustache, MD   REQUESTING/REFERRING PHYSICIAN: Sharma Covert, MD  CHIEF COMPLAINT:   Chief Complaint  Patient presents with  . Wound Check    HISTORY OF PRESENT ILLNESS:  [name]  is a 60 y.o. female who presents with Chronic lower extremity wounds which have developed purulent drainage over the last couple of days. She was at her primary care physician's office today for a follow-up and he sent her to the ED due to the same. Here in the ED her vitals are within normal limits, lab work is also within normal limits. Her wounds do have a purulent drainage with some surrounding skin discoloration. Hospitalists were called for admission for IV antibiotics.  PAST MEDICAL HISTORY:   Past Medical History:  Diagnosis Date  . ILD (interstitial lung disease) (HCC)   . Lupus   . Stroke [facility])     PAST SURGICAL HISTORY:   Past Surgical History:  Procedure Laterality Date  . ABDOMINAL HYSTERECTOMY    . APPENDECTOMY    . TONSILLECTOMY      SOCIAL HISTORY:   Social History  Substance Use Topics  . Smoking status: Former Games developer  . Smokeless tobacco: Not on file  . Alcohol use No    FAMILY HISTORY:   Family History  Problem Relation Age of Onset  . Hypertension Other     DRUG ALLERGIES:  No Known Allergies  MEDICATIONS AT HOME:   Prior to Admission medications   Medication Sig Start Date End Date Taking? Authorizing Provider  atorvastatin (LIPITOR) 40 MG tablet Take 1 tablet (40 mg total) by mouth daily at 6 PM. 01/19/16  Yes Wyatt Haste, MD  clopidogrel (PLAVIX) 75 MG tablet Take 75 mg by mouth daily.   Yes Historical Provider, MD  furosemide (LASIX) 20 MG tablet Take 20 mg by mouth.   Yes Historical Provider, MD  hydroxychloroquine (PLAQUENIL) 200 MG tablet Take 200  mg by mouth 2 (two) times daily.    Yes Historical Provider, MD  nitroGLYCERIN (NITROSTAT) 0.4 MG SL tablet Place 1 tablet (0.4 mg total) under the tongue every 5 (five) minutes as needed for chest pain. 01/19/16  Yes Wyatt Haste, MD    REVIEW OF SYSTEMS:  Review of Systems  Constitutional: Negative for chills, fever, malaise/fatigue and weight loss.  HENT: Negative for ear pain, hearing loss and tinnitus.   Eyes: Negative for blurred vision, double vision, pain and redness.  Respiratory: Negative for cough, hemoptysis and shortness of breath.   Cardiovascular: Negative for chest pain, palpitations, orthopnea and leg swelling.  Gastrointestinal: Negative for abdominal pain, constipation, diarrhea, nausea and vomiting.  Genitourinary: Negative for dysuria, frequency and hematuria.  Musculoskeletal: Negative for back pain, joint pain and neck pain.  Skin:       Wound erythema and purulent drainage  Neurological: Negative for dizziness, tremors, focal weakness and weakness.  Endo/Heme/Allergies: Negative for polydipsia. Does not bruise/bleed easily.  Psychiatric/Behavioral: Negative for depression. The patient is not nervous/anxious and does not have insomnia.      VITAL SIGNS:   Vitals:   06/02/16 1555 06/02/16 2015  BP: (!) 153/83 (!) 157/107  Pulse: 88 82  Resp: 18 19  Temp: 98.3 F (36.8 C) 97.9 F (36.6 C)  TempSrc: Oral Oral  SpO2: 100% 100%  Weight: 91.2 kg (201 lb)   Height: 5\' 2"  (1.575 m)    Wt Readings from Last 3 Encounters:  06/02/16 91.2 kg (201 lb)  01/17/16 88 kg (194 lb)  12/20/15 68 kg (150 lb)    PHYSICAL EXAMINATION:  Physical Exam  Vitals reviewed. Constitutional: She is oriented to person, place, and time. She appears well-developed and well-nourished. No distress.  HENT:  Head: Normocephalic and atraumatic.  Mouth/Throat: Oropharynx is clear and moist.  Eyes: Conjunctivae and EOM are normal. Pupils are equal, round, and reactive to light. No  scleral icterus.  Neck: Normal range of motion. Neck supple. No JVD present. No thyromegaly present.  Cardiovascular: Normal rate, regular rhythm and intact distal pulses.  Exam reveals no gallop and no friction rub.   No murmur heard. Respiratory: Effort normal and breath sounds normal. No respiratory distress. She has no wheezes. She has no rales.  GI: Soft. Bowel sounds are normal. She exhibits no distension. There is no tenderness.  Musculoskeletal: Normal range of motion. She exhibits no edema.  No arthritis, no gout  Lymphadenopathy:    She has no cervical adenopathy.  Neurological: She is alert and oriented to person, place, and time. No cranial nerve deficit.  No dysarthria, no aphasia  Skin: Skin is warm. No rash noted. There is erythema (bilateral lower extremity peri-wound discoloration, right greater than left,).  Wound purulent drainage, right greater than left lower extremity  Psychiatric: She has a normal mood and affect. Her behavior is normal. Judgment and thought content normal.    LABORATORY PANEL:   CBC  Recent Labs Lab 06/02/16 1841  WBC 6.1  HGB 14.7  HCT 45.1  PLT 103*   ------------------------------------------------------------------------------------------------------------------  Chemistries   Recent Labs Lab 06/02/16 1841  NA 143  K 4.0  CL 109  CO2 26  GLUCOSE 97  BUN 18  CREATININE 0.98  CALCIUM 8.6*   ------------------------------------------------------------------------------------------------------------------  Cardiac Enzymes No results for input(s): TROPONINI in the last 168 hours. ------------------------------------------------------------------------------------------------------------------  RADIOLOGY:  Dg Tibia/fibula Left  Result Date: 06/02/2016 CLINICAL DATA:  Open wounds at BILATERAL lower extremities, history lupus EXAM: LEFT TIBIA AND FIBULA - 2 VIEW COMPARISON:  None FINDINGS: Diffuse soft tissue swelling.  Osseous mineralization appears diffusely decreased. Knee and ankle joint alignments normal. No acute fracture, dislocation, or bone destruction. Subcutaneous calcifications at the knee and proximal lower leg are identified, similar to those identified in the RIGHT lower extremity. IMPRESSION: No acute osseous abnormalities. Electronically Signed   By: 06/04/2016 M.D.   On: 06/02/2016 20:48   Dg Tibia/fibula Right  Result Date: 06/02/2016 CLINICAL DATA:  Open wounds at BILATERAL lower extremities, history lupus EXAM: RIGHT TIBIA AND FIBULA - 2 VIEW COMPARISON:  12/21/2015 FINDINGS: Diffuse soft tissue swelling RIGHT lower leg. Osseous demineralization. Joint spaces preserved. No acute fracture or dislocation. Soft tissue calcifications subcutaneously at the knee and distal spine appear unchanged. IMPRESSION: Chronic soft tissue calcifications at the distal thigh and knee anteriorly. Diffuse soft tissue swelling without acute osseous abnormalities. Electronically Signed   By: 02/20/2016 M.D.   On: 06/02/2016 20:43   06/04/2016 Venous Img Lower Bilateral  Result Date: 06/02/2016 CLINICAL DATA:  Chronic bilateral lower extremity edema and soft tissue ulcerations. Initial encounter. EXAM: BILATERAL LOWER EXTREMITY VENOUS DOPPLER ULTRASOUND TECHNIQUE: Gray-scale sonography with graded compression, as well as color Doppler and duplex ultrasound were performed to evaluate the lower extremity deep venous systems from the level of the common femoral vein and including the  common femoral, femoral, profunda femoral, popliteal and calf veins including the posterior tibial, peroneal and gastrocnemius veins when visible. The superficial great saphenous vein was also interrogated. Spectral Doppler was utilized to evaluate flow at rest and with distal augmentation maneuvers in the common femoral, femoral and popliteal veins. COMPARISON:  None. FINDINGS: RIGHT LOWER EXTREMITY Common Femoral Vein: No evidence of thrombus. Normal  compressibility, respiratory phasicity and response to augmentation. Saphenofemoral Junction: No evidence of thrombus. Normal compressibility and flow on color Doppler imaging. Profunda Femoral Vein: No evidence of thrombus. Normal compressibility and flow on color Doppler imaging. Femoral Vein: No evidence of thrombus. Normal compressibility, respiratory phasicity and response to augmentation. Popliteal Vein: No evidence of thrombus. Normal compressibility, respiratory phasicity and response to augmentation. Calf Veins: No evidence of thrombus. Normal compressibility and flow on color Doppler imaging. The peroneal vein is not visualized. Superficial Great Saphenous Vein: No evidence of thrombus. Normal compressibility and flow on color Doppler imaging. Venous Reflux:  None. Other Findings:  None. LEFT LOWER EXTREMITY Common Femoral Vein: No evidence of thrombus. Normal compressibility, respiratory phasicity and response to augmentation. Saphenofemoral Junction: No evidence of thrombus. Normal compressibility and flow on color Doppler imaging. Profunda Femoral Vein: No evidence of thrombus. Normal compressibility and flow on color Doppler imaging. Femoral Vein: No evidence of thrombus. Normal compressibility, respiratory phasicity and response to augmentation. Popliteal Vein: No evidence of thrombus. Normal compressibility, respiratory phasicity and response to augmentation. Calf Veins: Not well characterized due to the patient's habitus and overlying soft tissue wounds. Superficial Great Saphenous Vein: No evidence of thrombus. Normal compressibility and flow on color Doppler imaging. Venous Reflux:  None. Other Findings:  None. IMPRESSION: No evidence of deep venous thrombosis. Evaluation mildly suboptimal due to the patient's habitus and overlying soft tissue wounds. Electronically Signed   By: Roanna Raider M.D.   On: 06/02/2016 20:06    EKG:   Orders placed or performed during the hospital encounter of  01/16/16  . EKG    IMPRESSION AND PLAN:  Principal Problem:   Wound infection - right greater than left lower extremity wounds present, with purulent drainage and some surrounding erythema. IV vancomycin started in the ED, we'll be continued on admission. Active Problems:   Lupus - on daily Plaquenil, continue   H/O: stroke - continue home dose Plavix  All the records are reviewed and case discussed with ED provider. Management plans discussed with the patient and/or family.  DVT PROPHYLAXIS: SubQ lovenox  GI PROPHYLAXIS: None  ADMISSION STATUS: Observation  CODE STATUS: Full Code Status History    Date Active Date Inactive Code Status Order ID Comments User Context   01/17/2016  2:33 AM 01/19/2016  9:16 PM Full Code [id number]  Oralia Manis, MD ED      TOTAL TIME TAKING CARE OF THIS PATIENT: 40 minutes.    Letonia Stead FIELDING 06/02/2016, 8:55 PM  [employer] [facility]  Office  [phone]  CC: Primary care physician; Sherrie Mustache, MD

## 2016-06-02 NOTE — ED Triage Notes (Signed)
Pt reports she has had wounds to BLE for months that she has been seen by wound care twice a week for, was seen yesterday and dressing placed. Pt reports over past 2 weeks she has noted that the wounds have not improved and have actually worsened. Pain has worsened also.

## 2016-06-02 NOTE — Progress Notes (Signed)
DEVONY, MCGRADY ([id number]) Visit Report for 06/01/2016 Arrival Information Details Patient Name: [name], [name] Date of Service: 06/01/2016 1:30 PM Medical Record Patient Account Number: [id number] Number: Treating RN: Huel Coventry 10-Sep-1955 (60 y.o. Other Clinician: Date of Birth/Sex: Female) Treating ROBSON, MICHAEL Primary Care Physician: Sherrie Mustache Physician/Extender: G Referring Physician: Sherrie Mustache Weeks in Treatment: 23 Visit Information History Since Last Visit Added or deleted any medications: No Patient Arrived: Ambulatory Any new allergies or adverse reactions: No Arrival Time: 13:46 Had a fall or experienced change in No Accompanied By: son activities of daily living that may affect Transfer Assistance: None risk of falls: Patient Identification Verified: Yes Signs or symptoms of abuse/neglect since last No Secondary Verification Process Yes visito Completed: Hospitalized since last visit: No Patient Requires Transmission-Based No Has Dressing in Place as Prescribed: Yes Precautions: Pain Present Now: No Patient Has Alerts: No Electronic Signature(s) Signed: 06/01/2016 3:51:22 PM By: Elliot Gurney, RN, [name], [name], [name] Entered By: Elliot Gurney, RN, [name], Kim on 06/01/2016 13:47:01 Lund, [name] ([id number]) -------------------------------------------------------------------------------- Encounter Discharge Information Details Patient Name: [name], [name] Date of Service: 06/01/2016 1:30 PM Medical Record Patient Account Number: [id number] Number: Treating RN: Huel Coventry 1956/04/30 (60 y.o. Other Clinician: Date of Birth/Sex: Female) Treating ROBSON, MICHAEL Primary Care Physician: Sherrie Mustache Physician/Extender: G Referring Physician: Sherrie Mustache Weeks in Treatment: 20 Encounter Discharge Information Items Discharge Pain Level: 0 Discharge Condition: Stable Ambulatory Status: Ambulatory Discharge Destination:  Home Private Transportation: Auto Accompanied By: self Schedule Follow-up Appointment: Yes Medication Reconciliation completed and Yes provided to Patient/Care Markelle Najarian: Clinical Summary of Care: Electronic Signature(s) Signed: 06/01/2016 5:35:41 PM By: Elliot Gurney, RN, [name], [name], [name] Entered By: Elliot Gurney, RN, [name], Kim on 06/01/2016 17:33:04 Weathers, [name] ([id number]) -------------------------------------------------------------------------------- General Visit Notes Details Patient Name: [name], [name] Date of Service: 06/01/2016 1:30 PM Medical Record Patient Account Number: [id number] Number: Treating RN: Huel Coventry 09-02-1955 (60 y.o. Other Clinician: Date of Birth/Sex: Female) Treating ROBSON, MICHAEL Primary Care Physician: Sherrie Mustache Physician/Extender: G Referring Physician: Sherrie Mustache Weeks in Treatment: 23 Notes Patient comes in today with bilateral lower edema +3. Her legs are large. Patients states there is no pain or discomfort, just swelling. Per MD patient has tolerated 4 layer wrap in the past, we have placed patient in 4 layer wrap today to try to get her swelling under control. Patient is to see Dr. Dario Guardian her PCP tomorrow at 2:30pm to discuss possible causes for the excessive swelling. Electronic Signature(s) Signed: 06/01/2016 5:35:41 PM By: Elliot Gurney, RN, [name], [name], [name] Entered By: Elliot Gurney, RN, [name], Kim on 06/01/2016 17:35:27 [name], [name] ([id number]) -------------------------------------------------------------------------------- Lower Extremity Assessment Details Patient Name: [name], [name] Date of Service: 06/01/2016 1:30 PM Medical Record Patient Account Number: [id number] Number: Treating RN: Huel Coventry 1956-05-06 (60 y.o. Other Clinician: Date of Birth/Sex: Female) Treating ROBSON, MICHAEL Primary Care Physician: Sherrie Mustache Physician/Extender: G Referring Physician: Sherrie Mustache Weeks in Treatment:  23 Edema Assessment Assessed: [Left: No] [Right: No] E[Left: dema] [Right: :] Calf Left: Right: Point of Measurement: 31 cm From Medial Instep 50 cm 50.5 cm Ankle Left: Right: Point of Measurement: 11 cm From Medial Instep 35.5 cm 34 cm Vascular Assessment Pulses: Posterior Tibial Dorsalis Pedis Palpable: [Left:No] [Right:No] Doppler: [Left:Monophasic] [Right:Monophasic] Extremity colors, hair growth, and conditions: Extremity Color: [Left:Hyperpigmented] [Right:Hyperpigmented] Hair Growth on Extremity: [Left:No] [Right:No] Temperature of Extremity: [Left:Cold] [Right:Cold] Capillary Refill: [Left:> 3 seconds] [Right:> 3 seconds] Dependent Rubor: [Left:No] [Right:No] Blanched when Elevated: [Left:No] [  Right:No] Lipodermatosclerosis: [Left:No] [Right:No] Toe Nail Assessment Left: Right: Thick: No No Discolored: No No Deformed: No No Improper Length and Hygiene: No No NIKERA, FURER ([id number]) Electronic Signature(s) Signed: 06/01/2016 3:51:22 PM By: Elliot Gurney, RN, [name], [name], [name] Entered By: Elliot Gurney, RN, [name], Kim on 06/01/2016 13:59:04 [name], [name] ([id number]) -------------------------------------------------------------------------------- Multi Wound Chart Details Patient Name: [name], [name] Date of Service: 06/01/2016 1:30 PM Medical Record Patient Account Number: [id number] Number: Treating RN: Huel Coventry 04/03/1956 (60 y.o. Other Clinician: Date of Birth/Sex: Female) Treating ROBSON, MICHAEL Primary Care Physician: Sherrie Mustache Physician/Extender: G Referring Physician: Sherrie Mustache Weeks in Treatment: 23 Vital Signs Height(in): 62 Pulse(bpm): 98 Weight(lbs): 170 Blood Pressure 155/90 (mmHg): Body Mass Index(BMI): 31 Temperature(F): 98.0 Respiratory Rate 16 (breaths/min): Photos: Wound Location: Right, Medial Lower Leg Left, Posterior Lower Leg Right, Proximal, Medial Lower Leg Wounding Event: Blister Gradually Appeared  Blister Primary Etiology: Venous Leg Ulcer Lymphedema Venous Leg Ulcer Date Acquired: 12/15/2015 04/19/2016 05/18/2016 Weeks of Treatment: 23 4 2  Wound Status: Open Open Open Clustered Wound: No Yes No Measurements L x W x D 8x12x0.1 11x7x0.1 3x5.4x0.1 (cm) Area (cm) : 75.398 60.476 12.723 Volume (cm) : 7.54 6.048 1.272 % Reduction in Area: -190.90% -7031.60% 38.60% % Reduction in Volume: -190.90% -7015.30% 69.30% Classification: Partial Thickness Partial Thickness Full Thickness Without Exposed Support Structures Periwound Skin Texture: No Abnormalities Noted No Abnormalities Noted No Abnormalities Noted Periwound Skin No Abnormalities Noted No Abnormalities Noted No Abnormalities Noted Moisture: Periwound Skin Color: No Abnormalities Noted No Abnormalities Noted No Abnormalities Noted Vaneaton, [name] ([id number]) Tenderness on No No No Palpation: Wound Number: 4 5 N/A Photos: N/A Wound Location: Right, Posterior Lower Left, Proximal, Medial N/A Leg Lower Leg Wounding Event: Gradually Appeared Blister N/A Primary Etiology: Venous Leg Ulcer Venous Leg Ulcer N/A Date Acquired: 05/25/2016 05/25/2016 N/A Weeks of Treatment: 1 1 N/A Wound Status: Open Open N/A Clustered Wound: No No N/A Measurements L x W x D 2x1.5x0.1 2x3.5x0.1 N/A (cm) Area (cm) : 2.356 5.498 N/A Volume (cm) : 0.236 0.55 N/A % Reduction in Area: -66.60% -104.70% N/A % Reduction in Volume: -67.40% -104.50% N/A Classification: Partial Thickness Partial Thickness N/A Periwound Skin Texture: No Abnormalities Noted No Abnormalities Noted N/A Periwound Skin No Abnormalities Noted No Abnormalities Noted N/A Moisture: Periwound Skin Color: No Abnormalities Noted No Abnormalities Noted N/A Tenderness on No No N/A Palpation: Treatment Notes Electronic Signature(s) Signed: 06/01/2016 5:35:41 PM By: Elliot Gurney, RN, [name], [name], [name] Entered By: Elliot Gurney, RN, [name], Kim on 06/01/2016 17:26:13 [name], [name]  ([id number]) -------------------------------------------------------------------------------- Multi-Disciplinary Care Plan Details Patient Name: Sagun, Riely L. Date of Service: 06/01/2016 1:30 PM Medical Record Patient Account Number: [id number] Number: Treating RN: Huel Coventry 09/02/1955 (60 y.o. Other Clinician: Date of Birth/Sex: Female) Treating ROBSON, MICHAEL Primary Care Physician: Sherrie Mustache Physician/Extender: G Referring Physician: Sherrie Mustache Weeks in Treatment: 48 Active Inactive Abuse / Safety / Falls / Self Care Management Nursing Diagnoses: Impaired physical mobility Potential for falls Goals: Patient will remain injury free Date Initiated: 12/23/2015 Goal Status: Active Interventions: Assess fall risk on admission and as needed Notes: Nutrition Nursing Diagnoses: Imbalanced nutrition Goals: Patient/caregiver verbalizes understanding of need to maintain therapeutic glucose control per primary care physician Date Initiated: 12/23/2015 Goal Status: Active Interventions: Provide education on nutrition Treatment Activities: Education provided on Nutrition : 01/21/2016 Notes: Orientation to the Wound Care Program TAMIJA, AHERNE. ([id number]) Nursing Diagnoses: Knowledge deficit related to the wound healing center program Goals: Patient/caregiver will verbalize  understanding of the Wound Healing Center Program Date Initiated: 12/23/2015 Goal Status: Active Interventions: Provide education on orientation to the wound center Notes: Venous Leg Ulcer Nursing Diagnoses: Potential for venous Insuffiency (use before diagnosis confirmed) Goals: Patient will maintain optimal edema control Date Initiated: 12/23/2015 Goal Status: Active Interventions: Provide education on venous insufficiency Notes: Wound/Skin Impairment Nursing Diagnoses: Impaired tissue integrity Goals: Ulcer/skin breakdown will heal within 14 weeks Date Initiated:  12/23/2015 Goal Status: Active Interventions: Assess ulceration(s) every visit Notes: Electronic Signature(s) Signed: 06/01/2016 5:35:41 PM By: Elliot GurneyWoody, RN, [name], [name], [name] Entered By: Elliot GurneyWoody, RN, [name], Kim on 06/01/2016 17:26:05 Casco, [name] MallardGLORIA L. ([id number]) [name], [name] Elbert EwingsL. ([id number]) -------------------------------------------------------------------------------- Pain Assessment Details Patient Name: [name], [name] Date of Service: 06/01/2016 1:30 PM Medical Record Patient Account Number: [id number] Number: Treating RN: Huel CoventryWoody, Kim 08/13/1956 (60 y.o. Other Clinician: Date of Birth/Sex: Female) Treating ROBSON, MICHAEL Primary Care Physician: Sherrie MustacheJADALI, FAYEGH Physician/Extender: G Referring Physician: Sherrie MustacheJADALI, FAYEGH Weeks in Treatment: 23 Active Problems Location of Pain Severity and Description of Pain Patient Has Paino No Site Locations With Dressing Change: No Pain Management and Medication Current Pain Management: Notes Topical or injectable lidocaine is offered to patient for acute pain when surgical debridement is performed. If needed, Patient is instructed to use over the counter pain medication for the following 24-48 hours after debridement. Wound care MDs do not prescribed pain medications. Patient has chronic pain or uncontrolled pain. Patient has been instructed to make an appointment with their Primary Care Physician for pain management. Electronic Signature(s) Signed: 06/01/2016 3:51:22 PM By: Elliot GurneyWoody, RN, [name], [name], [name] Entered By: Elliot GurneyWoody, RN, [name], Kim on 06/01/2016 13:47:13 [name], [name] MallardGLORIA L. ([id number]) -------------------------------------------------------------------------------- Patient/Caregiver Education Details Patient Name: Pringle, Aziah L. Date of Service: 06/01/2016 1:30 PM Medical Record Patient Account Number: [id number] Number: Treating RN: Huel CoventryWoody, Kim 01/17/1956 (60 y.o. Other Clinician: Date of  Birth/Gender: Female) Treating ROBSON, MICHAEL Primary Care Physician: Sherrie MustacheJADALI, FAYEGH Physician/Extender: G Referring Physician: Augustin CoupeJADALI, FAYEGH Weeks in Treatment: 4423 Education Assessment Education Provided To: Patient Education Topics Provided Wound/Skin Impairment: Handouts: Caring for Your Ulcer Methods: Demonstration Responses: State content correctly Electronic Signature(s) Signed: 06/01/2016 5:35:41 PM By: Elliot GurneyWoody, RN, [name], [name], [name] Entered By: Elliot GurneyWoody, RN, [name], Kim on 06/01/2016 17:33:20 Teschner, [name] MallardGLORIA L. ([id number]) -------------------------------------------------------------------------------- Wound Assessment Details Patient Name: Collard, Telia L. Date of Service: 06/01/2016 1:30 PM Medical Record Patient Account Number: [id number] Number: Treating RN: Huel CoventryWoody, Kim 04/11/1956 (60 y.o. Other Clinician: Date of Birth/Sex: Female) Treating ROBSON, MICHAEL Primary Care Physician: Sherrie MustacheJADALI, FAYEGH Physician/Extender: G Referring Physician: Sherrie MustacheJADALI, FAYEGH Weeks in Treatment: 23 Wound Status Wound Number: 1 Primary Etiology: Venous Leg Ulcer Wound Location: Right, Medial Lower Leg Wound Status: Open Wounding Event: Blister Date Acquired: 12/15/2015 Weeks Of Treatment: 23 Clustered Wound: No Photos Photo Uploaded By: Elliot GurneyWoody, RN, [name], Kim on 06/01/2016 15:46:46 Wound Measurements Length: (cm) 8 Width: (cm) 12 Depth: (cm) 0.1 Area: (cm) 75.398 Volume: (cm) 7.54 % Reduction in Area: -190.9% % Reduction in Volume: -190.9% Wound Description Classification: Partial Thickness Periwound Skin Texture Texture Color No Abnormalities Noted: No No Abnormalities Noted: No Moisture No Abnormalities Noted: No Treatment Notes Barbato, Katasha L. ([id number]) Wound #1 (Right, Medial Lower Leg) 1. Cleansed with: Cleanse wound with antibacterial soap and water 4. Dressing Applied: Calcium Alginate with Silver 5. Secondary Dressing Applied ABD Pad 7. Secured  with 4 Layer Compression System - Bilateral Notes Xtra Sorb, drawtex, abd Electronic Signature(s) Signed: 06/01/2016 3:51:22 PM By: Elliot GurneyWoody, RN, [name], [name],  [name] Entered By: Elliot Gurney, RN, [name], Kim on 06/01/2016 14:02:43 Craze, [name] ([id number]) -------------------------------------------------------------------------------- Wound Assessment Details Patient Name: Willcutt, Shaylee L. Date of Service: 06/01/2016 1:30 PM Medical Record Patient Account Number: [id number] Number: Treating RN: Huel Coventry 02-03-1956 (60 y.o. Other Clinician: Date of Birth/Sex: Female) Treating ROBSON, MICHAEL Primary Care Physician: Sherrie Mustache Physician/Extender: G Referring Physician: Sherrie Mustache Weeks in Treatment: 23 Wound Status Wound Number: 2 Primary Etiology: Lymphedema Wound Location: Left, Posterior Lower Leg Wound Status: Open Wounding Event: Gradually Appeared Date Acquired: 04/19/2016 Weeks Of Treatment: 4 Clustered Wound: No Photos Photo Uploaded By: Elliot Gurney, RN, [name], Kim on 06/01/2016 15:46:46 Wound Measurements Length: (cm) 11 Width: (cm) 7 Depth: (cm) 0.1 Area: (cm) 60.476 Volume: (cm) 6.048 % Reduction in Area: -7031.6% % Reduction in Volume: -7015.3% Wound Description Classification: Partial Thickness Periwound Skin Texture Texture Color No Abnormalities Noted: No No Abnormalities Noted: No Moisture No Abnormalities Noted: No Electronic Signature(s) TAITLYN, BINGLEY ([id number]) Signed: 06/01/2016 3:51:22 PM By: Elliot Gurney, RN, [name], [name], [name] Entered By: Elliot Gurney, RN, [name], Kim on 06/01/2016 14:02:43 Loewe, [name] ([id number]) -------------------------------------------------------------------------------- Wound Assessment Details Patient Name: Tubbs, Loys L. Date of Service: 06/01/2016 1:30 PM Medical Record Patient Account Number: [id number] Number: Treating RN: Huel Coventry Jan 07, 1956 (60 y.o. Other Clinician: Date of  Birth/Sex: Female) Treating ROBSON, MICHAEL Primary Care Physician: Sherrie Mustache Physician/Extender: G Referring Physician: Sherrie Mustache Weeks in Treatment: 23 Wound Status Wound Number: 3 Primary Etiology: Venous Leg Ulcer Wound Location: Right, Proximal, Medial Lower Wound Status: Open Leg Wounding Event: Blister Date Acquired: 05/18/2016 Weeks Of Treatment: 2 Clustered Wound: No Photos Photo Uploaded By: Elliot Gurney, RN, [name], Kim on 06/01/2016 15:47:37 Wound Measurements Length: (cm) 3 Width: (cm) 5.4 Depth: (cm) 0.1 Area: (cm) 12.723 Volume: (cm) 1.272 % Reduction in Area: 38.6% % Reduction in Volume: 69.3% Wound Description Full Thickness Without Exposed Classification: Support Structures Periwound Skin Texture Texture Color No Abnormalities Noted: No No Abnormalities Noted: No Moisture No Abnormalities Noted: No [place], Kiyara L. ([id number]) Treatment Notes Wound #3 (Right, Proximal, Medial Lower Leg) 1. Cleansed with: Cleanse wound with antibacterial soap and water 4. Dressing Applied: Calcium Alginate with Silver 5. Secondary Dressing Applied ABD Pad 7. Secured with 4 Layer Compression System - Bilateral Notes Xtra Sorb, drawtex, abd Electronic Signature(s) Signed: 06/01/2016 3:51:22 PM By: Elliot Gurney, RN, [name], [name], [name] Entered By: Elliot Gurney, RN, [name], Kim on 06/01/2016 14:02:43 Donelan, [name] ([id number]) -------------------------------------------------------------------------------- Wound Assessment Details Patient Name: Fasig, Gaynelle L. Date of Service: 06/01/2016 1:30 PM Medical Record Patient Account Number: [id number] Number: Treating RN: Huel Coventry [birth year] (60 y.o. Other Clinician: Date of Birth/Sex: Female) Treating ROBSON, MICHAEL Primary Care Physician: Sherrie Mustache Physician/Extender: G Referring Physician: Sherrie Mustache Weeks in Treatment: 23 Wound Status Wound Number: 4 Primary Etiology: Venous Leg  Ulcer Wound Location: Right, Posterior Lower Leg Wound Status: Open Wounding Event: Gradually Appeared Date Acquired: 05/25/2016 Weeks Of Treatment: 1 Clustered Wound: No Photos Photo Uploaded By: Elliot Gurney, RN, [name], Kim on 06/01/2016 15:47:37 Wound Measurements Length: (cm) 2 Width: (cm) 1.5 Depth: (cm) 0.1 Area: (cm) 2.356 Volume: (cm) 0.236 % Reduction in Area: -66.6% % Reduction in Volume: -67.4% Wound Description Classification: Partial Thickness Periwound Skin Texture Texture Color No Abnormalities Noted: No No Abnormalities Noted: No Moisture No Abnormalities Noted: No Treatment Notes Betzer, Ananiah L. ([id number]) Wound #4 (Right, Posterior Lower Leg) 1. Cleansed with: Cleanse wound with antibacterial soap and water 4. Dressing Applied: Calcium Alginate with Silver 5. Secondary Dressing  Applied ABD Pad 7. Secured with 4 Layer Compression System - Bilateral Notes Xtra Sorb, drawtex, abd Electronic Signature(s) Signed: 06/01/2016 3:51:22 PM By: Elliot Gurney, RN, [name], [name], [name] Entered By: Elliot Gurney, RN, [name], Kim on 06/01/2016 14:02:44 Zeiger, [name] ([id number]) -------------------------------------------------------------------------------- Wound Assessment Details Patient Name: Repka, Vidya L. Date of Service: 06/01/2016 1:30 PM Medical Record Patient Account Number: [id number] Number: Treating RN: Huel Coventry Jul 26, 1956 (60 y.o. Other Clinician: Date of Birth/Sex: Female) Treating ROBSON, MICHAEL Primary Care Physician: Sherrie Mustache Physician/Extender: G Referring Physician: Sherrie Mustache Weeks in Treatment: 23 Wound Status Wound Number: 5 Primary Etiology: Venous Leg Ulcer Wound Location: Left, Proximal, Medial Lower Wound Status: Open Leg Wounding Event: Blister Date Acquired: 05/25/2016 Weeks Of Treatment: 1 Clustered Wound: No Photos Photo Uploaded By: Elliot Gurney, RN, [name], Kim on 06/01/2016 15:48:28 Wound Measurements Length: (cm)  2 Width: (cm) 3.5 Depth: (cm) 0.1 Area: (cm) 5.498 Volume: (cm) 0.55 % Reduction in Area: -104.7% % Reduction in Volume: -104.5% Wound Description Classification: Partial Thickness Periwound Skin Texture Texture Color No Abnormalities Noted: No No Abnormalities Noted: No Moisture No Abnormalities Noted: No Sorce, Chenise L. ([id number]) Treatment Notes Wound #5 (Left, Proximal, Medial Lower Leg) 1. Cleansed with: Cleanse wound with antibacterial soap and water 4. Dressing Applied: Calcium Alginate with Silver 5. Secondary Dressing Applied ABD Pad 7. Secured with 4 Layer Compression System - Bilateral Notes Xtra Sorb, drawtex, abd Electronic Signature(s) Signed: 06/01/2016 3:51:22 PM By: Elliot Gurney, RN, [name], [name], [name] Entered By: Elliot Gurney, RN, [name], Kim on 06/01/2016 14:02:44 Settle, [name] ([id number]) -------------------------------------------------------------------------------- Vitals Details Patient Name: Aigner, Krissia L. Date of Service: 06/01/2016 1:30 PM Medical Record Patient Account Number: [id number] Number: Treating RN: Huel Coventry 03-Aug-1956 (60 y.o. Other Clinician: Date of Birth/Sex: Female) Treating ROBSON, MICHAEL Primary Care Physician: Sherrie Mustache Physician/Extender: G Referring Physician: Sherrie Mustache Weeks in Treatment: 23 Vital Signs Time Taken: 13:47 Temperature (F): 98.0 Height (in): 62 Pulse (bpm): 98 Weight (lbs): 170 Respiratory Rate (breaths/min): 16 Body Mass Index (BMI): 31.1 Blood Pressure (mmHg): 155/90 Reference Range: 80 - 120 mg / dl Electronic Signature(s) Signed: 06/01/2016 3:51:22 PM By: Elliot Gurney, RN, [name], [name], [name] Entered By: Elliot Gurney, RN, [name], Kim on 06/01/2016 13:47:32

## 2016-06-02 NOTE — ED Provider Notes (Signed)
[facility] Emergency Department Provider Note  ____________________________________________  Time seen: Approximately 6:16 PM  I have reviewed the triage vital signs and the nursing notes.   HISTORY  Chief Complaint Wound Check    HPI [name] is a 60 y.o. female with a history of lupus, stroke, and nonhealing bilateral lower extremity ulcers presenting with increased swelling, pain, and drainage from her ulcers. The patient reports that she has been followed by the wound care clinic for approximate 4 months for her wounds. Over the last 2 weeks, she has had increased swelling, and pain as well as yellow malodorous discharge. She denies any fever but has had some nausea and vomiting last week. she denies any chest pain, shortness of breath.   Past Medical History:  Diagnosis Date  . ILD (interstitial lung disease) (HCC)   . Lupus   . Stroke [facility])     Patient Active Problem List   Diagnosis Date Noted  . Chest pain 01/17/2016  . Lupus 01/17/2016  . H/O: stroke 01/17/2016    Past Surgical History:  Procedure Laterality Date  . ABDOMINAL HYSTERECTOMY    . APPENDECTOMY    . TONSILLECTOMY      Current Outpatient Rx  . Order #: [id number] Class: Print  . Order #: [id number] Class: Historical Med  . Order #: [id number] Class: Historical Med  . Order #: [id number] Class: Historical Med  . Order #: [id number] Class: Print    Allergies Review of patient's allergies indicates no known allergies.  No family history on file.  Social History Social History  Substance Use Topics  . Smoking status: Former Games developer  . Smokeless tobacco: Not on file  . Alcohol use No    Review of Systems Constitutional: No fever/chills.No lightheadedness or syncope. Eyes: No visual changes. ENT: No sore throat. No congestion or rhinorrhea. Cardiovascular: Denies chest pain. Denies palpitations. Respiratory: Denies shortness of breath.  No cough. Gastrointestinal:  No abdominal pain.  Positive nausea, positive vomiting.  No diarrhea.  No constipation. Genitourinary: Negative for dysuria. Musculoskeletal: Negative for back pain. Positive for bilateral lower extremity swelling. Skin: Positive for bilateral lower extremity ulcers that are leaking yellow discharge and malodorous. Neurological: Negative for headaches. No focal numbness, tingling or weakness.   10-point ROS otherwise negative.  ____________________________________________   PHYSICAL EXAM:  VITAL SIGNS: ED Triage Vitals  Enc Vitals Group     BP 06/02/16 1555 (!) 153/83     Pulse Rate 06/02/16 1555 88     Resp 06/02/16 1555 18     Temp 06/02/16 1555 98.3 F (36.8 C)     Temp Source 06/02/16 1555 Oral     SpO2 06/02/16 1555 100 %     Weight 06/02/16 1555 201 lb (91.2 kg)     Height 06/02/16 1555 5\' 2"  (1.575 m)     Head Circumference --      Peak Flow --      Pain Score 06/02/16 1556 5     Pain Loc --      Pain Edu? --      Excl. in GC? --     Constitutional: Alert and oriented. Chronically ill appearing but not toxic. Answers questions appropriately. Eyes: Conjunctivae are normal.  EOMI. No scleral icterus. Head: Atraumatic. Nose: No congestion/rhinnorhea. Mouth/Throat: Mucous membranes are moist.  Neck: No stridor.  Supple.   Cardiovascular: Normal rate, regular rhythm. No murmurs, rubs or gallops.  Respiratory: Normal respiratory effort.  No accessory muscle use or retractions. Lungs CTAB.  No wheezes, rales or ronchi. Gastrointestinal: Obese. Soft, nontender and nondistended.  No guarding or rebound.  No peritoneal signs. Musculoskeletal: Positive bilateral lower extremity swelling. The patient's legs are wrapped, and upon unwrapping them: Right lower extremity with multiple open wounds that have some granulomatous tissue, some erythema, but multiple areas on the anterior and posterior aspect of the lower part of the extremity with purulent discharge. There is minor  involvement of the foot as well. On the left side, the patient has 2 areas of skin breakdown on the proximal lateral lower leg and on the distal medial aspect of the leg with a similar appearance as above. Cap refill is approximately 2-3 seconds, the patient has decreased sensation to light touch diffusely, and pulses are not palpable due to significant swelling. Neurologic:  A&Ox3.  Speech is clear.  Face and smile are symmetric.  EOMI.  Moves all extremities well. Skin:  Skin is warm, dry and intact. No rash noted. Psychiatric: Mood and affect are normal. Speech and behavior are normal.  Normal judgement.  ____________________________________________   LABS (all labs ordered are listed, but only abnormal results are displayed)  Labs Reviewed  CBC - Abnormal; Notable for the following:       Result Value   Platelets 103 (*)    All other components within normal limits  BASIC METABOLIC PANEL - Abnormal; Notable for the following:    Calcium 8.6 (*)    All other components within normal limits  CULTURE, BLOOD (ROUTINE X 2)  CULTURE, BLOOD (ROUTINE X 2)  BRAIN NATRIURETIC PEPTIDE  SEDIMENTATION RATE  C-REACTIVE PROTEIN   ____________________________________________  EKG  Not indicated  ____________________________________________  RADIOLOGY  Dg Tibia/fibula Right  Result Date: 06/02/2016 CLINICAL DATA:  Open wounds at BILATERAL lower extremities, history lupus EXAM: RIGHT TIBIA AND FIBULA - 2 VIEW COMPARISON:  12/21/2015 FINDINGS: Diffuse soft tissue swelling RIGHT lower leg. Osseous demineralization. Joint spaces preserved. No acute fracture or dislocation. Soft tissue calcifications subcutaneously at the knee and distal spine appear unchanged. IMPRESSION: Chronic soft tissue calcifications at the distal thigh and knee anteriorly. Diffuse soft tissue swelling without acute osseous abnormalities. Electronically Signed   By: Ulyses Southward M.D.   On: 06/02/2016 20:43   US Venous Img  Lower Bilateral  Result Date: 06/02/2016 CLINICAL DATA:  Chronic bilateral lower extremity edema and soft tissue ulcerations. Initial encounter. EXAM: BILATERAL LOWER EXTREMITY VENOUS DOPPLER ULTRASOUND TECHNIQUE: Gray-scale sonography with graded compression, as well as color Doppler and duplex ultrasound were performed to evaluate the lower extremity deep venous systems from the level of the common femoral vein and including the common femoral, femoral, profunda femoral, popliteal and calf veins including the posterior tibial, peroneal and gastrocnemius veins when visible. The superficial great saphenous vein was also interrogated. Spectral Doppler was utilized to evaluate flow at rest and with distal augmentation maneuvers in the common femoral, femoral and popliteal veins. COMPARISON:  None. FINDINGS: RIGHT LOWER EXTREMITY Common Femoral Vein: No evidence of thrombus. Normal compressibility, respiratory phasicity and response to augmentation. Saphenofemoral Junction: No evidence of thrombus. Normal compressibility and flow on color Doppler imaging. Profunda Femoral Vein: No evidence of thrombus. Normal compressibility and flow on color Doppler imaging. Femoral Vein: No evidence of thrombus. Normal compressibility, respiratory phasicity and response to augmentation. Popliteal Vein: No evidence of thrombus. Normal compressibility, respiratory phasicity and response to augmentation. Calf Veins: No evidence of thrombus. Normal compressibility and flow on color Doppler imaging. The peroneal vein is not visualized. Superficial Great  Saphenous Vein: No evidence of thrombus. Normal compressibility and flow on color Doppler imaging. Venous Reflux:  None. Other Findings:  None. LEFT LOWER EXTREMITY Common Femoral Vein: No evidence of thrombus. Normal compressibility, respiratory phasicity and response to augmentation. Saphenofemoral Junction: No evidence of thrombus. Normal compressibility and flow on color Doppler  imaging. Profunda Femoral Vein: No evidence of thrombus. Normal compressibility and flow on color Doppler imaging. Femoral Vein: No evidence of thrombus. Normal compressibility, respiratory phasicity and response to augmentation. Popliteal Vein: No evidence of thrombus. Normal compressibility, respiratory phasicity and response to augmentation. Calf Veins: Not well characterized due to the patient's habitus and overlying soft tissue wounds. Superficial Great Saphenous Vein: No evidence of thrombus. Normal compressibility and flow on color Doppler imaging. Venous Reflux:  None. Other Findings:  None. IMPRESSION: No evidence of deep venous thrombosis. Evaluation mildly suboptimal due to the patient's habitus and overlying soft tissue wounds. Electronically Signed   By: Roanna Raider M.D.   On: 06/02/2016 20:06    ____________________________________________   PROCEDURES  Procedure(s) performed: None  Procedures  Critical Care performed: No ____________________________________________   INITIAL IMPRESSION / ASSESSMENT AND PLAN / ED COURSE  Pertinent labs & imaging results that were available during my care of the patient were reviewed by me and considered in my medical decision making (see chart for details).  60 y.o. female with a 4 month history of nonhealing lower extremity wounds presenting with 2 weeks of increased swelling, pain, and malodorous discharge. Overall, the patient's vital signs are reassuring and she is afebrile. However, there are multiple areas of open wounds with purulence on my examination. We'll get x-ray to rule out any air or evidence of gangrene, sedimentation rate and CRP, evaluate for osteomyelitis. In addition, she'll have an ultrasound to evaluate for bilateral lower extremity DVTs. We will initiate immediate antibiotics and plan admission for the patient.  ----------------------------------------- 8:46 PM on  06/02/2016 -----------------------------------------  The patient's bilateral lower extremity ultrasounds, although suboptimal due to body habitus and her wounds, do not show any gross evidence of DVT. In addition, there is no evidence of bony involvement or gas on her plain x-rays. Her vital signs continued to be reassuring, and her sedimentation rate is within normal limits. At this time, I'll plan to admit the patient for further treatment of her infection ____________________________________________  FINAL CLINICAL IMPRESSION(S) / ED DIAGNOSES  Final diagnoses:  Nonhealing nonsurgical wound  Localized swelling of lower extremity    Clinical Course      NEW MEDICATIONS STARTED DURING THIS VISIT:  New Prescriptions   No medications on file      Rockne Menghini, MD 06/02/16 2047

## 2016-06-03 LAB — BASIC METABOLIC PANEL
Anion gap: 7 (ref 5–15)
BUN: 18 mg/dL (ref 6–20)
CALCIUM: 8 mg/dL — AB (ref 8.9–10.3)
CO2: 23 mmol/L (ref 22–32)
Chloride: 113 mmol/L — ABNORMAL HIGH (ref 101–111)
Creatinine, Ser: 1.02 mg/dL — ABNORMAL HIGH (ref 0.44–1.00)
GFR calc Af Amer: >60 mL/min (ref >60)
GFR, EST NON AFRICAN AMERICAN: 58 mL/min — AB (ref >60)
GLUCOSE: 86 mg/dL (ref 65–99)
Potassium: 3.4 mmol/L — ABNORMAL LOW (ref 3.5–5.1)
Sodium: 143 mmol/L (ref 135–145)

## 2016-06-03 LAB — C-REACTIVE PROTEIN: CRP: 1.5 mg/dL — AB (ref <1.0)

## 2016-06-03 LAB — CBC
HEMATOCRIT: 42 % (ref 35.0–47.0)
Hemoglobin: 13.9 g/dL (ref 12.0–16.0)
MCH: 31.9 pg (ref 26.0–34.0)
MCHC: 33 g/dL (ref 32.0–36.0)
MCV: 96.7 fL (ref 80.0–100.0)
Platelets: 96 10*3/uL — ABNORMAL LOW (ref 150–440)
RBC: 4.34 MIL/uL (ref 3.80–5.20)
RDW: 14.7 % — AB (ref 11.5–14.5)
WBC: 6.3 10*3/uL (ref 3.6–11.0)

## 2016-06-03 MED ORDER — MORPHINE SULFATE (PF) 2 MG/ML IV SOLN
2.0000 mg | INTRAVENOUS | Status: DC | PRN
  Administered 2016-06-03 – 2016-06-05 (×8): 2 mg via INTRAVENOUS
  Filled 2016-06-03 (×8): qty 1

## 2016-06-03 MED ORDER — DOXYCYCLINE HYCLATE 100 MG PO TABS
100.0000 mg | ORAL_TABLET | Freq: Two times a day (BID) | ORAL | Status: DC
  Administered 2016-06-03 – 2016-06-07 (×8): 100 mg via ORAL
  Filled 2016-06-03 (×8): qty 1

## 2016-06-03 MED ORDER — MORPHINE SULFATE (PF) 2 MG/ML IV SOLN
INTRAVENOUS | Status: AC
  Filled 2016-06-03: qty 1

## 2016-06-03 NOTE — Progress Notes (Addendum)
[facility] - [place] [facility]   PATIENT NAME: [name]    MR#:  [id number]  DATE OF BIRTH:  February 23, 1956  SUBJECTIVE:  CHIEF COMPLAINT:  Patient is resting comfortably. Has chronic lower extremity edema  REVIEW OF SYSTEMS:  CONSTITUTIONAL: No fever, fatigue or weakness.  EYES: No blurred or double vision.  EARS, NOSE, AND THROAT: No tinnitus or ear pain.  RESPIRATORY: No cough, shortness of breath, wheezing or hemoptysis.  CARDIOVASCULAR: No chest pain, orthopnea, edema.  GASTROINTESTINAL: No nausea, vomiting, diarrhea or abdominal pain.  GENITOURINARY: No dysuria, hematuria.  ENDOCRINE: No polyuria, nocturia,  HEMATOLOGY: No anemia, easy bruising or bleeding SKIN: Has chronic lower extremity edema , lower leg wounds MUSCULOSKELETAL: No joint pain or arthritis.   NEUROLOGIC: No tingling, numbness, weakness.  PSYCHIATRY: No anxiety or depression.   DRUG ALLERGIES:  No Known Allergies  VITALS:  Blood pressure 117/60, pulse (!) 109, temperature 98.3 F (36.8 C), temperature source Oral, resp. rate 18, height 5\' 2"  (1.575 m), weight 91.2 kg (201 lb), SpO2 100 %.  PHYSICAL EXAMINATION:  GENERAL:  60 y.o.-year-old patient lying in the bed with no acute distress.  EYES: Pupils equal, round, reactive to light and accommodation. No scleral icterus. Extraocular muscles intact.  HEENT: Head atraumatic, normocephalic. Oropharynx and nasopharynx clear.  NECK:  Supple, no jugular venous distention. No thyroid enlargement, no tenderness.  LUNGS: Normal breath sounds bilaterally, no wheezing, rales,rhonchi or crepitation. No use of accessory muscles of respiration.  CARDIOVASCULAR: S1, S2 normal. No murmurs, rubs, or gallops.  ABDOMEN: Soft, nontender, nondistended. Bowel sounds present. No organomegaly or mass.  EXTREMITIES: Bilateral lower extremity wounds , right greater than left erythematous base no purulence No cyanosis, or clubbing.  NEUROLOGIC:  Cranial nerves II through XII are intact. Muscle strength 5/5 in all extremities. Sensation intact. Gait not checked.  PSYCHIATRIC: The patient is alert and oriented x 3.  SKIN: No obvious rash, lesion, or ulcer.    LABORATORY PANEL:   CBC  Recent Labs Lab 06/03/16 0550  WBC 6.3  HGB 13.9  HCT 42.0  PLT 96*   ------------------------------------------------------------------------------------------------------------------  Chemistries   Recent Labs Lab 06/03/16 0550  NA 143  K 3.4*  CL 113*  CO2 23  GLUCOSE 86  BUN 18  CREATININE 1.02*  CALCIUM 8.0*   ------------------------------------------------------------------------------------------------------------------  Cardiac Enzymes No results for input(s): TROPONINI in the last 168 hours. ------------------------------------------------------------------------------------------------------------------  RADIOLOGY:  Dg Tibia/fibula Left  Result Date: 06/02/2016 CLINICAL DATA:  Open wounds at BILATERAL lower extremities, history lupus EXAM: LEFT TIBIA AND FIBULA - 2 VIEW COMPARISON:  None FINDINGS: Diffuse soft tissue swelling. Osseous mineralization appears diffusely decreased. Knee and ankle joint alignments normal. No acute fracture, dislocation, or bone destruction. Subcutaneous calcifications at the knee and proximal lower leg are identified, similar to those identified in the RIGHT lower extremity. IMPRESSION: No acute osseous abnormalities. Electronically Signed   By: 06/04/2016 M.D.   On: 06/02/2016 20:48   Dg Tibia/fibula Right  Result Date: 06/02/2016 CLINICAL DATA:  Open wounds at BILATERAL lower extremities, history lupus EXAM: RIGHT TIBIA AND FIBULA - 2 VIEW COMPARISON:  12/21/2015 FINDINGS: Diffuse soft tissue swelling RIGHT lower leg. Osseous demineralization. Joint spaces preserved. No acute fracture or dislocation. Soft tissue calcifications subcutaneously at the knee and distal spine appear unchanged.  IMPRESSION: Chronic soft tissue calcifications at the distal thigh and knee anteriorly. Diffuse soft tissue swelling without acute osseous abnormalities. Electronically Signed   By: 02/20/2016  M.D.   On: 06/02/2016 20:43   US Venous Img Lower Bilateral  Result Date: 06/02/2016 CLINICAL DATA:  Chronic bilateral lower extremity edema and soft tissue ulcerations. Initial encounter. EXAM: BILATERAL LOWER EXTREMITY VENOUS DOPPLER ULTRASOUND TECHNIQUE: Gray-scale sonography with graded compression, as well as color Doppler and duplex ultrasound were performed to evaluate the lower extremity deep venous systems from the level of the common femoral vein and including the common femoral, femoral, profunda femoral, popliteal and calf veins including the posterior tibial, peroneal and gastrocnemius veins when visible. The superficial great saphenous vein was also interrogated. Spectral Doppler was utilized to evaluate flow at rest and with distal augmentation maneuvers in the common femoral, femoral and popliteal veins. COMPARISON:  None. FINDINGS: RIGHT LOWER EXTREMITY Common Femoral Vein: No evidence of thrombus. Normal compressibility, respiratory phasicity and response to augmentation. Saphenofemoral Junction: No evidence of thrombus. Normal compressibility and flow on color Doppler imaging. Profunda Femoral Vein: No evidence of thrombus. Normal compressibility and flow on color Doppler imaging. Femoral Vein: No evidence of thrombus. Normal compressibility, respiratory phasicity and response to augmentation. Popliteal Vein: No evidence of thrombus. Normal compressibility, respiratory phasicity and response to augmentation. Calf Veins: No evidence of thrombus. Normal compressibility and flow on color Doppler imaging. The peroneal vein is not visualized. Superficial Great Saphenous Vein: No evidence of thrombus. Normal compressibility and flow on color Doppler imaging. Venous Reflux:  None. Other Findings:  None. LEFT  LOWER EXTREMITY Common Femoral Vein: No evidence of thrombus. Normal compressibility, respiratory phasicity and response to augmentation. Saphenofemoral Junction: No evidence of thrombus. Normal compressibility and flow on color Doppler imaging. Profunda Femoral Vein: No evidence of thrombus. Normal compressibility and flow on color Doppler imaging. Femoral Vein: No evidence of thrombus. Normal compressibility, respiratory phasicity and response to augmentation. Popliteal Vein: No evidence of thrombus. Normal compressibility, respiratory phasicity and response to augmentation. Calf Veins: Not well characterized due to the patient's habitus and overlying soft tissue wounds. Superficial Great Saphenous Vein: No evidence of thrombus. Normal compressibility and flow on color Doppler imaging. Venous Reflux:  None. Other Findings:  None. IMPRESSION: No evidence of deep venous thrombosis. Evaluation mildly suboptimal due to the patient's habitus and overlying soft tissue wounds. Electronically Signed   By: Roanna Raider M.D.   On: 06/02/2016 20:06    EKG:   Orders placed or performed during the hospital encounter of 01/16/16  . EKG    ASSESSMENT AND PLAN:    # Wound infection - right greater than left lower extremity wounds  Continue iV vancomycin  Wound care consult placed  Pain management with the Norco as needed CRP normal range at 1.5, sedimentation rate 14 normal  #Bilateral lower extremity edema BNP 57 normal range Continue home medication Lasix 20 g by mouth once daily encouraged patient to keep her legs elevated  #Recent diagnosis of hepatitis C Consult infectious diseases for establishment of care and outpatient GI follow-up   #Lupus - on daily Plaquenil, continue    # H/O: stroke - continue home dose Plavix, Lipitor 40 mg by mouth once daily    All the records are reviewed and case discussed with Care Management/Social Workerr. Management plans discussed with the patient, family  and they are in agreement.  CODE STATUS: fc   TOTAL TIME TAKING CARE OF THIS PATIENT: 35 minutes.   POSSIBLE D/C IN 1-2DAYS, DEPENDING ON CLINICAL CONDITION.  Note: This dictation was prepared with Dragon dictation along with smaller phrase technology. Any transcriptional errors that result  from this process are unintentional.   Ramonita Lab M.D on 06/03/2016 at 1:03 PM  Between 7am to 6pm - Pager - [phone] After 6pm go to [email or web address] - password [email or web address] [facility]  [facility]  Office  [phone]  CC: Primary care physician; Sherrie Mustache, MD

## 2016-06-03 NOTE — Progress Notes (Signed)
Pharmacy Antibiotic Note  [name] is a 60 y.o. female admitted on 06/02/2016 with wound infection.  Pharmacy has been consulted for vancomycin dosing.  Plan: Patient received one dose vancomycin 1750 mg IV on 10/18. Pt will be started on vanc 1250 mg IV q 18 hours ordered with stacked dosing.   Discussed needing additional coverage (for anaerobes and/or gram neg) and per MD it is not needed at this time.  DW 67kg  Vd 47L kei 0.058 hr-1  T1/2 12 hours Level before 5th dose. Goal trough 15-20.  Height: 5\' 2"  (157.5 cm) Weight: 201 lb (91.2 kg) IBW/kg (Calculated) : 50.1  Temp (24hrs), Avg:98 F (36.7 C), Min:97.5 F (36.4 C), Max:98.3 F (36.8 C)   Recent Labs Lab 06/02/16 1841 06/03/16 0550  WBC 6.1 6.3  CREATININE 0.98 1.02*    Estimated Creatinine Clearance: 60.8 mL/min (by C-G formula based on SCr of 1.02 mg/dL (H)).    No Known Allergies  Antimicrobials this admission: vancomycin  >>    Dose adjustments this admission:   Microbiology results: 10/17 BCx: pending: no growth < 12 hrs  Thank you for allowing pharmacy to be a part of this patient's care.  11/17, PharmD Pharmacy Resident 06/03/2016 2:43 PM

## 2016-06-03 NOTE — Consult Note (Signed)
[name]     Reason for Consult: LE wound infection    Referring Physician: Nicholes Mango Date of Admission:  06/02/2016   Principal Problem:   Wound infection Active Problems:   Lupus   H/O: stroke   HPI: [name] is a 60 y.o. female with lupus, CVA admitted with bil LE wounds and ulcers.   She follows with the wound care center since May.  It seems that the main issue has been lymphedema and venous stasis and that the arterial studies were normal.  On admission her wbc is nml and no fevers. I see no culture data. Today her wounds are wrapped with several layer wrap.  Past Medical History:  Diagnosis Date  . ILD (interstitial lung [name]) ([place])   . Lupus   . Stroke [facility])    Past Surgical History:  Procedure Laterality Date  . ABDOMINAL HYSTERECTOMY    . APPENDECTOMY    . TONSILLECTOMY     Social History  Substance Use Topics  . Smoking status: Former Research scientist (life sciences)  . Smokeless tobacco: Former Systems developer  . Alcohol use No   Family History  Problem Relation Age of Onset  . Hypertension Other     Allergies: No Known Allergies  Current antibiotics: Antibiotics Given (last 72 hours)    Date/Time Action Medication Dose Rate   06/03/16 1011 Given   hydroxychloroquine (PLAQUENIL) tablet 200 mg 200 mg    06/03/16 1458 Given   vancomycin (VANCOCIN) 1,250 mg in sodium chloride 0.9 % 250 mL IVPB 1,250 mg 166.7 mL/hr      MEDICATIONS: . atorvastatin  40 mg Oral q1800  . clopidogrel  75 mg Oral Daily  . enoxaparin (LOVENOX) injection  40 mg Subcutaneous Q24H  . furosemide  20 mg Oral Daily  . hydroxychloroquine  200 mg Oral BID  . vancomycin  1,250 mg Intravenous Q18H    Review of Systems - 11 systems reviewed and negative per HPI   OBJECTIVE: Temp:  [97.5 F (36.4 C)-98.3 F (36.8 C)] 97.9 F (36.6 C) (10/19 1435) Pulse Rate:  [82-109] 108 (10/19 1435) Resp:  [17-20] 20 (10/19 1435) BP: (117-172)/(60-116) 131/81 (10/19 1435) SpO2:  [77  %-100 %] 98 % (10/19 1435) Physical Exam  Constitutional:  Frail HENT: [place]/AT, PERRLA, no scleral icterus Mouth/Throat: Oropharynx is clear and moist. No oropharyngeal exudate.  Cardiovascular: Normal rate, regular rhythm and normal heart sounds. Pulmonary/Chest: Effort normal and breath sounds normal.Neck = supple, no nuchal rigidity Abdominal: Soft. Bowel sounds are normal.  exhibits no distension. There is no tenderness.  Lymphadenopathy: no cervical adenopathy. No axillary adenopathy Neurological: confused slightly   Skin: Bil le wrapped with several layer wrap .  LABS: Results for orders placed or performed during the hospital encounter of 06/02/16 (from the past 48 hour(s))  CBC     Status: Abnormal   Collection Time: 06/02/16  6:41 PM  Result Value Ref Range   WBC 6.1 3.6 - 11.0 K/uL   RBC 4.66 3.80 - 5.20 MIL/uL   Hemoglobin 14.7 12.0 - 16.0 g/dL   HCT 45.1 35.0 - 47.0 %   MCV 96.8 80.0 - 100.0 fL   MCH 31.6 26.0 - 34.0 pg   MCHC 32.7 32.0 - 36.0 g/dL   RDW 14.3 11.5 - 14.5 %   Platelets 103 (L) 150 - 440 K/uL  Basic metabolic panel     Status: Abnormal   Collection Time: 06/02/16  6:41 PM  Result Value Ref Range  Sodium 143 135 - 145 mmol/L   Potassium 4.0 3.5 - 5.1 mmol/L   Chloride 109 101 - 111 mmol/L   CO2 26 22 - 32 mmol/L   Glucose, Bld 97 65 - 99 mg/dL   BUN 18 6 - 20 mg/dL   Creatinine, Ser 0.98 0.44 - 1.00 mg/dL   Calcium 8.6 (L) 8.9 - 10.3 mg/dL   GFR calc non Af Amer >60 >60 mL/min   GFR calc Af Amer >60 >60 mL/min    Comment: (NOTE) The eGFR has been calculated using the CKD EPI equation. This calculation has not been validated in all clinical situations. eGFR's persistently <60 mL/min signify possible Chronic Kidney [name].    Anion gap 8 5 - 15  Blood culture (routine x 2)     Status: None (Preliminary result)   Collection Time: 06/02/16  6:41 PM  Result Value Ref Range   Specimen Description BLOOD RIGHT ASSIST CONTROL    Special Requests  BOTTLES DRAWN AEROBIC AND ANAEROBIC 9CCAERO,2CCANA    Culture NO GROWTH < 12 HOURS    Report Status PENDING   Brain natriuretic peptide     Status: None   Collection Time: 06/02/16  6:41 PM  Result Value Ref Range   B Natriuretic Peptide 55.0 0.0 - 100.0 pg/mL  Sedimentation rate     Status: None   Collection Time: 06/02/16  6:41 PM  Result Value Ref Range   Sed Rate 14 0 - 30 mm/hr  C-reactive protein     Status: Abnormal   Collection Time: 06/02/16  6:41 PM  Result Value Ref Range   CRP 1.5 (H) <1.0 mg/dL    Comment: Performed at [facility]  Blood culture (routine x 2)     Status: None (Preliminary result)   Collection Time: 06/02/16  8:24 PM  Result Value Ref Range   Specimen Description BLOOD LEFT HAND    Special Requests BOTTLES DRAWN AEROBIC AND ANAEROBIC 4CC    Culture NO GROWTH < 12 HOURS    Report Status PENDING   Basic metabolic panel     Status: Abnormal   Collection Time: 06/03/16  5:50 AM  Result Value Ref Range   Sodium 143 135 - 145 mmol/L   Potassium 3.4 (L) 3.5 - 5.1 mmol/L   Chloride 113 (H) 101 - 111 mmol/L   CO2 23 22 - 32 mmol/L   Glucose, Bld 86 65 - 99 mg/dL   BUN 18 6 - 20 mg/dL   Creatinine, Ser 1.02 (H) 0.44 - 1.00 mg/dL   Calcium 8.0 (L) 8.9 - 10.3 mg/dL   GFR calc non Af Amer 58 (L) >60 mL/min   GFR calc Af Amer >60 >60 mL/min    Comment: (NOTE) The eGFR has been calculated using the CKD EPI equation. This calculation has not been validated in all clinical situations. eGFR's persistently <60 mL/min signify possible Chronic Kidney [name].    Anion gap 7 5 - 15  CBC     Status: Abnormal   Collection Time: 06/03/16  5:50 AM  Result Value Ref Range   WBC 6.3 3.6 - 11.0 K/uL   RBC 4.34 3.80 - 5.20 MIL/uL   Hemoglobin 13.9 12.0 - 16.0 g/dL   HCT 42.0 35.0 - 47.0 %   MCV 96.7 80.0 - 100.0 fL   MCH 31.9 26.0 - 34.0 pg   MCHC 33.0 32.0 - 36.0 g/dL   RDW 14.7 (H) 11.5 - 14.5 %   Platelets 96 (L)  150 - 440 K/uL   No components found  for: ESR, C REACTIVE PROTEIN MICRO: Recent Results (from the past 720 hour(s))  Blood culture (routine x 2)     Status: None (Preliminary result)   Collection Time: 06/02/16  6:41 PM  Result Value Ref Range Status   Specimen Description BLOOD RIGHT ASSIST CONTROL  Final   Special Requests BOTTLES DRAWN AEROBIC AND ANAEROBIC 9CCAERO,2CCANA  Final   Culture NO GROWTH < 12 HOURS  Final   Report Status PENDING  Incomplete  Blood culture (routine x 2)     Status: None (Preliminary result)   Collection Time: 06/02/16  8:24 PM  Result Value Ref Range Status   Specimen Description BLOOD LEFT HAND  Final   Special Requests BOTTLES DRAWN AEROBIC AND ANAEROBIC 4CC  Final   Culture NO GROWTH < 12 HOURS  Final   Report Status PENDING  Incomplete    IMAGING: Dg Tibia/fibula Left  Result Date: 06/02/2016 CLINICAL DATA:  Open wounds at BILATERAL lower extremities, history lupus EXAM: LEFT TIBIA AND FIBULA - 2 VIEW COMPARISON:  None FINDINGS: Diffuse soft tissue swelling. Osseous mineralization appears diffusely decreased. Knee and ankle joint alignments normal. No acute fracture, dislocation, or bone destruction. Subcutaneous calcifications at the knee and proximal lower leg are identified, similar to those identified in the RIGHT lower extremity. IMPRESSION: No acute osseous abnormalities. Electronically Signed   By: Lavonia Dana M.D.   On: 06/02/2016 20:48   Dg Tibia/fibula Right  Result Date: 06/02/2016 CLINICAL DATA:  Open wounds at BILATERAL lower extremities, history lupus EXAM: RIGHT TIBIA AND FIBULA - 2 VIEW COMPARISON:  12/21/2015 FINDINGS: Diffuse soft tissue swelling RIGHT lower leg. Osseous demineralization. Joint spaces preserved. No acute fracture or dislocation. Soft tissue calcifications subcutaneously at the knee and distal spine appear unchanged. IMPRESSION: Chronic soft tissue calcifications at the distal thigh and knee anteriorly. Diffuse soft tissue swelling without acute osseous  abnormalities. Electronically Signed   By: Lavonia Dana M.D.   On: 06/02/2016 20:43   US Abdomen Complete  Result Date: 05/26/2016 CLINICAL DATA:  Hepatitis-C.  Elevated LFTs. EXAM: ULTRASOUND ABDOMEN ULTRASOUND HEPATIC ELASTOGRAPHY TECHNIQUE: Sonography of the upper abdomen was performed. In addition, ultrasound elastography evaluation of the liver was performed. A region of interest was placed within the right lobe of the liver. Following application of a compressive sonographic pulse, shear waves were detected in the adjacent hepatic tissue and the shear wave velocity was calculated. Multiple assessments were performed at the selected site. Median shear wave velocity is correlated to a Metavir fibrosis score. COMPARISON:  None. FINDINGS: ULTRASOUND ABDOMEN Gallbladder: Prior cholecystectomy Common bile duct: Diameter: Normal caliber, 4 mm Liver: Increased echotexture compatible with fatty infiltration. No focal abnormality or biliary ductal dilatation. IVC: No abnormality visualized. Pancreas: Visualized portion unremarkable. Spleen: Size and appearance within normal limits. Right Kidney: Length: 8.6 cm. Mildly increased echotexture. No hydronephrosis or mass. Left Kidney: Length: 8.6 cm. Mildly increased echotexture. No hydronephrosis or mass. Abdominal aorta: No aneurysm visualized. Other findings: None. ULTRASOUND HEPATIC ELASTOGRAPHY Device: Siemens Helix VTQ Patient position: Supine Transducer 6 C1 Number of measurements: 10 Hepatic segment:  8 Median velocity:   2.49  m/sec IQR: 0.25 IQR/Median velocity ratio: 0.100 402 Corresponding Metavir fibrosis score:  Summed F3+ F4 Risk of fibrosis: High Limitations of exam: None Pertinent findings noted on other imaging exams:  None Please note that abnormal shear wave velocities may also be identified in clinical settings other than with hepatic fibrosis, such  as: acute hepatitis, elevated right heart and central venous pressures including use of beta blockers,  veno-occlusive [name] (Budd-Chiari), infiltrative processes such as mastocytosis/amyloidosis/infiltrative tumor, extrahepatic cholestasis, in the post-prandial state, and liver transplantation. Correlation with patient history, laboratory data, and clinical condition recommended. IMPRESSION: ULTRASOUND ABDOMEN: Increased echotexture throughout the liver compatible with fatty infiltration. Prior cholecystectomy. Increased echotexture within the kidneys suggesting chronic medical renal [name]. ULTRASOUND HEPATIC ELASTOGRAPHY: Median hepatic shear wave velocity is calculated at 2.49 m/sec. Corresponding Metavir fibrosis score is  F3/F4. Risk of fibrosis is high. Follow-up: Advised Electronically Signed   By: Rolm Baptise M.D.   On: 05/26/2016 10:48   [place] Arfi Elastography Add On  Result Date: 05/26/2016 CLINICAL DATA:  Hepatitis-C.  Elevated LFTs. EXAM: ULTRASOUND ABDOMEN ULTRASOUND HEPATIC ELASTOGRAPHY TECHNIQUE: Sonography of the upper abdomen was performed. In addition, ultrasound elastography evaluation of the liver was performed. A region of interest was placed within the right lobe of the liver. Following application of a compressive sonographic pulse, shear waves were detected in the adjacent hepatic tissue and the shear wave velocity was calculated. Multiple assessments were performed at the selected site. Median shear wave velocity is correlated to a Metavir fibrosis score. COMPARISON:  None. FINDINGS: ULTRASOUND ABDOMEN Gallbladder: Prior cholecystectomy Common bile duct: Diameter: Normal caliber, 4 mm Liver: Increased echotexture compatible with fatty infiltration. No focal abnormality or biliary ductal dilatation. IVC: No abnormality visualized. Pancreas: Visualized portion unremarkable. Spleen: Size and appearance within normal limits. Right Kidney: Length: 8.6 cm. Mildly increased echotexture. No hydronephrosis or mass. Left Kidney: Length: 8.6 cm. Mildly increased echotexture. No hydronephrosis or  mass. Abdominal aorta: No aneurysm visualized. Other findings: None. ULTRASOUND HEPATIC ELASTOGRAPHY Device: Siemens Helix VTQ Patient position: Supine Transducer 6 C1 Number of measurements: 10 Hepatic segment:  8 Median velocity:   2.49  m/sec IQR: 0.25 IQR/Median velocity ratio: 0.100 402 Corresponding Metavir fibrosis score:  Summed F3+ F4 Risk of fibrosis: High Limitations of exam: None Pertinent findings noted on other imaging exams:  None Please note that abnormal shear wave velocities may also be identified in clinical settings other than with hepatic fibrosis, such as: acute hepatitis, elevated right heart and central venous pressures including use of beta blockers, veno-occlusive [name] (Budd-Chiari), infiltrative processes such as mastocytosis/amyloidosis/infiltrative tumor, extrahepatic cholestasis, in the post-prandial state, and liver transplantation. Correlation with patient history, laboratory data, and clinical condition recommended. IMPRESSION: ULTRASOUND ABDOMEN: Increased echotexture throughout the liver compatible with fatty infiltration. Prior cholecystectomy. Increased echotexture within the kidneys suggesting chronic medical renal [name]. ULTRASOUND HEPATIC ELASTOGRAPHY: Median hepatic shear wave velocity is calculated at 2.49 m/sec. Corresponding Metavir fibrosis score is  F3/F4. Risk of fibrosis is high. Follow-up: Advised Electronically Signed   By: Rolm Baptise M.D.   On: 05/26/2016 10:48   US Venous Img Lower Bilateral  Result Date: 06/02/2016 CLINICAL DATA:  Chronic bilateral lower extremity edema and soft tissue ulcerations. Initial encounter. EXAM: BILATERAL LOWER EXTREMITY VENOUS DOPPLER ULTRASOUND TECHNIQUE: Gray-scale sonography with graded compression, as well as color Doppler and duplex ultrasound were performed to evaluate the lower extremity deep venous systems from the level of the common femoral vein and including the common femoral, femoral, profunda femoral,  popliteal and calf veins including the posterior tibial, peroneal and gastrocnemius veins when visible. The superficial great saphenous vein was also interrogated. Spectral Doppler was utilized to evaluate flow at rest and with distal augmentation maneuvers in the common femoral, femoral and popliteal veins. COMPARISON:  None. FINDINGS: RIGHT LOWER EXTREMITY Common Femoral Vein:  No evidence of thrombus. Normal compressibility, respiratory phasicity and response to augmentation. Saphenofemoral Junction: No evidence of thrombus. Normal compressibility and flow on color Doppler imaging. Profunda Femoral Vein: No evidence of thrombus. Normal compressibility and flow on color Doppler imaging. Femoral Vein: No evidence of thrombus. Normal compressibility, respiratory phasicity and response to augmentation. Popliteal Vein: No evidence of thrombus. Normal compressibility, respiratory phasicity and response to augmentation. Calf Veins: No evidence of thrombus. Normal compressibility and flow on color Doppler imaging. The peroneal vein is not visualized. Superficial Great Saphenous Vein: No evidence of thrombus. Normal compressibility and flow on color Doppler imaging. Venous Reflux:  None. Other Findings:  None. LEFT LOWER EXTREMITY Common Femoral Vein: No evidence of thrombus. Normal compressibility, respiratory phasicity and response to augmentation. Saphenofemoral Junction: No evidence of thrombus. Normal compressibility and flow on color Doppler imaging. Profunda Femoral Vein: No evidence of thrombus. Normal compressibility and flow on color Doppler imaging. Femoral Vein: No evidence of thrombus. Normal compressibility, respiratory phasicity and response to augmentation. Popliteal Vein: No evidence of thrombus. Normal compressibility, respiratory phasicity and response to augmentation. Calf Veins: Not well characterized due to the patient's habitus and overlying soft tissue wounds. Superficial Great Saphenous Vein: No  evidence of thrombus. Normal compressibility and flow on color Doppler imaging. Venous Reflux:  None. Other Findings:  None. IMPRESSION: No evidence of deep venous thrombosis. Evaluation mildly suboptimal due to the patient's habitus and overlying soft tissue wounds. Electronically Signed   By: Garald Balding M.D.   On: 06/02/2016 20:06    Assessment:   LANORA REVERON is a 61 y.o. female with chronic lymphedema bil LE and open wounds weeping fluid. This has been a chronic issue and she has been following with wound care since May 2017. Appears venous in nature with prior ABIS reportedly normal. She has no fevers or leukocytosis.  Unclear what abx she has had if any recently.  ESR and CRP are nml.   Echo was nml in August.  LE dopplers negative I do not think there is overwhelming infection at this point and mainly needs control of the edema and possibly a short course of oral abx  Recommendations Will order culture of the wounds at time of next wrap change Can change to oral doxy in place of vanco Would follow wound recs for the LE dressings Thank you very much for allowing me to participate in the care of this patient. Please call with questions.   Cheral Marker. Ola Spurr, MD

## 2016-06-03 NOTE — Care Management Obs Status (Signed)
MEDICARE OBSERVATION STATUS NOTIFICATION   Patient Details  Name: AYLEAH HOFMEISTER MRN: [id number] Date of Birth: [birth year]   Medicare Observation Status Notification Given:  Yes    Berna Bue, RN 06/03/2016, 8:44 AM

## 2016-06-03 NOTE — Consult Note (Signed)
WOC Nurse wound consult note Reason for Consult: Chronic venous insufficiency.  Wears compression through [facility] and wound care center.  Nonintact lesions with cellulitis Wound type:Chronic venous insufficiency with cellulitis Pressure Ulcer POA: N/A Measurement:Multiple 2 cm x 2 cm x 0.1 cm scattered nonintact lesions.  Ruddy red and warm to touch Wound bed:red and warm.  tender Drainage (amount, consistency, odor) Moderate serous weeping Periwound:Edema and erythema  tenderness Dressing procedure/placement/frequency:Clenase bilateral legs with soap and water and pat gently dry.  Apply calcium alginate to open wounds, zinc layer, kerlix for absorption and secure with self adherent Coban.  Change twice weekly. Monday and Thursday.  WOC team will follow.  Maple Hudson RN [name] CWON Pager [phone]

## 2016-06-04 ENCOUNTER — Ambulatory Visit: Payer: Medicare Other

## 2016-06-04 MED ORDER — HYDROCODONE-ACETAMINOPHEN 5-325 MG PO TABS: 1.0000 | ORAL_TABLET | Freq: Four times a day (QID) | ORAL | 0 refills | Status: DC | PRN

## 2016-06-04 MED ORDER — DOXYCYCLINE HYCLATE 100 MG PO TABS: 100.0000 mg | ORAL_TABLET | Freq: Two times a day (BID) | ORAL | 0 refills | Status: DC

## 2016-06-04 NOTE — Evaluation (Signed)
Physical Therapy Evaluation Patient Details Name: [name] MRN: [id number] DOB: March 29, 1956 Today's Date: 06/04/2016   History of Present Illness  Pt admitted for possible infected wound infection. Pt with complaints of chronic LE wounds that developed purelent drainage over past few days. Pt with history of interstital lung disease, lupus, and CGA.  Clinical Impression  Pt is a pleasant 60 year old female who was admitted for drainage from wounds. Currently B LEs wrapped and elvated off bed upon arrival. Pt performs bed mobility/transfers with min assist and ambulation with min/mod assist. Pt reports needing to use bathroom upon arrival and says she has previously ambulated to bathroom. Secondary to pain, pt unable to ambulate to bathroom; used BSC with assistance required. Pt currently not at baseline level at this time. Pt demonstrates deficits with strength/endurance/pain/balance. Would benefit from skilled PT to address above deficits and promote optimal return to PLOF; recommend transition to STR upon discharge from acute hospitalization.       Follow Up Recommendations SNF    Equipment Recommendations       Recommendations for Other Services       Precautions / Restrictions Precautions Precautions: Fall Restrictions Weight Bearing Restrictions: No      Mobility  Bed Mobility Overal bed mobility: Needs Assistance Bed Mobility: Supine to Sit     Supine to sit: Min assist     General bed mobility comments: assist for bed mobility and scooting out towards EOB. Pt struggles with lifting B LE off bed to slide to edge. Once at EOB, pt able to sit with cga. Increased pain in B LE while in dependent position.   Transfers Overall transfer level: Needs assistance Equipment used: Rolling walker (2 wheeled) Transfers: Sit to/from Stand Sit to Stand: Min assist         General transfer comment: Pt able to perform first attempt without RW, however unable to fully  acheive upright posture. 2nd attempt performed with AD with improved ease, however post leaning noted secondary to R foot pain. Limited ability to weight bear on R LE.  Ambulation/Gait Ambulation/Gait assistance: Min assist;Mod assist Ambulation Distance (Feet): 3 Feet Assistive device: Rolling walker (2 wheeled) Gait Pattern/deviations: Step-to pattern     General Gait Details: Pt with shuffling gait pattern during ambulation to [facility]. Pt with increased unsteadiness noted. Heavy reliance of B UE on RW.  Stairs            Wheelchair Mobility    Modified Rankin (Stroke Patients Only)       Balance Overall balance assessment: Needs assistance;History of Falls Sitting-balance support: Feet supported Sitting balance-Leahy Scale: Fair     Standing balance support: Bilateral upper extremity supported Standing balance-Leahy Scale: Fair                               Pertinent Vitals/Pain Pain Assessment: 0-10 Pain Score: 8  Pain Location: R foot on plantar surface Pain Descriptors / Indicators: Dull;Discomfort Pain Intervention(s): Limited activity within patient's tolerance;Repositioned    Home Living Family/patient expects to be discharged to:: Private residence Living Arrangements: Children (reports she lives with son) Available Help at Discharge: Family Type of Home: House Home Access: Stairs to enter Entrance Stairs-Rails: Can reach both Entrance Stairs-Number of Steps: 4 Home Layout: One level Home Equipment: None      Prior Function Level of Independence: Independent  Hand Dominance        Extremity/Trunk Assessment   Upper Extremity Assessment: Generalized weakness (B UE grossly 4/5)           Lower Extremity Assessment: Generalized weakness (B LE grossly 3/5; unable to perform SLR)         Communication   Communication: No difficulties  Cognition Arousal/Alertness: Awake/alert Behavior During Therapy: WFL for  tasks assessed/performed Overall Cognitive Status: Within Functional Limits for tasks assessed                      General Comments      Exercises Other Exercises Other Exercises: Pt ambulated to [facility] with min/mod assist and needs heavy cues for sequencing and reaching back for seated surface. Pt then requires min assist to stand in order to perform self hygiene. Assisted pt back to bed   Assessment/Plan    PT Assessment Patient needs continued PT services  PT Problem List Decreased strength;Decreased balance;Decreased mobility;Pain          PT Treatment Interventions Gait training;DME instruction;Therapeutic exercise    PT Goals (Current goals can be found in the Care Plan section)  Acute Rehab PT Goals Patient Stated Goal: to get stronger PT Goal Formulation: With patient Time For Goal Achievement: 06/18/16 Potential to Achieve Goals: Good    Frequency Min 2X/week   Barriers to discharge        Co-evaluation               End of Session Equipment Utilized During Treatment: Gait belt Activity Tolerance: Patient tolerated treatment well Patient left: in bed;with bed alarm set (pt sat at EOB as lunch tray entered room.) Nurse Communication: Mobility status    Functional Assessment Tool Used: clinical judgement Functional Limitation: Mobility: Walking and moving around Mobility: Walking and Moving Around Current Status (O1157): At least 40 percent but less than 60 percent impaired, limited or restricted Mobility: Walking and Moving Around Goal Status [phone]): At least 20 percent but less than 40 percent impaired, limited or restricted    Time: 1315-1330 PT Time Calculation (min) (ACUTE ONLY): 15 min   Charges:   PT Evaluation $PT Eval Moderate Complexity: 1 Procedure PT Treatments $Therapeutic Activity: 8-22 mins   PT G Codes:   PT G-Codes **NOT FOR INPATIENT CLASS** Functional Assessment Tool Used: clinical judgement Functional Limitation:  Mobility: Walking and moving around Mobility: Walking and Moving Around Current Status (T5974): At least 40 percent but less than 60 percent impaired, limited or restricted Mobility: Walking and Moving Around Goal Status [phone]): At least 20 percent but less than 40 percent impaired, limited or restricted    Shelden Raborn 06/04/2016, 2:43 PM  Elizabeth Palau, PT, DPT [phone]

## 2016-06-04 NOTE — Discharge Instructions (Signed)
Follow-up with primary care physician in a week Follow-up with infectious disease in 2 weeks Continue wound care and follow-up with wound care , wound care to get wound cultures next time when they changed the dressing Keep legs elevated

## 2016-06-04 NOTE — Discharge Summary (Addendum)
[facility] - [place] at [facility]   PATIENT NAME: [name]    MR#:  [id number]  DATE OF BIRTH:  15-Jan-1956  DATE OF ADMISSION:  06/02/2016 ADMITTING PHYSICIAN: Oralia Manis, MD  DATE OF DISCHARGE: 06/07/2016  PRIMARY CARE PHYSICIAN: Sherrie Mustache, MD    ADMISSION DIAGNOSIS:  Nonhealing nonsurgical wound [T14.8XXA] Localized swelling of lower extremity [M79.89]  DISCHARGE DIAGNOSIS:  Venous ulcers from chronic venous insufficiency with cellulitis  SECONDARY DIAGNOSIS:   Past Medical History:  Diagnosis Date  . ILD (interstitial lung disease) (HCC)   . Lupus   . Stroke [facility])     HOSPITAL COURSE:    Chronic lymphedema with bilateral lower extremity open Wound infection - right greater than left lower extremity wounds  Given iV vancomycin , changed to by mouth doxycycline per ID recommendations  UNNA boot and encouraged the patient to elevate legs  Appreciate wound care recommendations .wound cultures  today during next Dressing changes Pain management with the Norco as needed CRP normal range at 1.5, sedimentation rate 14 normal  #Bilateral lower extremity edema BNP 57 normal range Continue home medication Lasix 20 g by mouth once daily encouraged patient to keep her legs elevated  #Recent diagnosis of hepatitis C Consult infectious diseases for establishment of care and outpatient GI follow-up   #Lupus - on daily Plaquenil, continue   #H/O: stroke - continue home dose Plavix, Lipitor 40 mg by mouth once daily  PT has recommended rehabilitation-insurance doen't cover , pt is getting discharged to home with home health PT, RN, social worker and health aide. Hospital bed and walker will be given. Discussed with patient and patient's sister ms.[name] over phone agreeable with the current plan of care  DISCHARGE CONDITIONS:   fair  CONSULTS OBTAINED:  Treatment Team:  Ramonita Lab, MD   PROCEDURES none  DRUG ALLERGIES:   No Known Allergies  DISCHARGE MEDICATIONS:   Current Discharge Medication List    START taking these medications   Details  docusate sodium (COLACE) 100 MG capsule Take 1 capsule (100 mg total) by mouth 2 (two) times daily as needed for mild constipation. Qty: 10 capsule, Refills: 0    doxycycline (VIBRA-TABS) 100 MG tablet Take 1 tablet (100 mg total) by mouth every 12 (twelve) hours. Qty: 10 tablet, Refills: 0    HYDROcodone-acetaminophen (NORCO/VICODIN) 5-325 MG tablet Take 1 tablet by mouth every 6 (six) hours as needed for moderate pain. Qty: 30 tablet, Refills: 0      CONTINUE these medications which have NOT CHANGED   Details  atorvastatin (LIPITOR) 40 MG tablet Take 1 tablet (40 mg total) by mouth daily at 6 PM. Qty: 30 tablet, Refills: 0    clopidogrel (PLAVIX) 75 MG tablet Take 75 mg by mouth daily.    furosemide (LASIX) 20 MG tablet Take 20 mg by mouth.    hydroxychloroquine (PLAQUENIL) 200 MG tablet Take 200 mg by mouth 2 (two) times daily.     nitroGLYCERIN (NITROSTAT) 0.4 MG SL tablet Place 1 tablet (0.4 mg total) under the tongue every 5 (five) minutes as needed for chest pain. Qty: 30 tablet, Refills: 12         DISCHARGE INSTRUCTIONS:   Follow-up with primary care physician in a week Follow-up with infectious disease in 2 weeks Continue wound care and follow-up with wound care , wound care to get wound cultures next time when they changed the dressing Keep legs elevated  DIET:  Cardiac diet  DISCHARGE CONDITION:  Fair  ACTIVITY:  Activity as tolerated  OXYGEN:  Home Oxygen: No.   Oxygen Delivery: room air  DISCHARGE LOCATION:  home   If you experience worsening of your admission symptoms, develop shortness of breath, life threatening emergency, suicidal or homicidal thoughts you must seek medical attention immediately by calling 911 or calling your MD immediately  if symptoms less severe.  You Must read complete instructions/literature  along with all the possible adverse reactions/side effects for all the Medicines you take and that have been prescribed to you. Take any new Medicines after you have completely understood and accpet all the possible adverse reactions/side effects.   Please note  You were cared for by a hospitalist during your hospital stay. If you have any questions about your discharge medications or the care you received while you were in the hospital after you are discharged, you can call the unit and asked to speak with the hospitalist on call if the hospitalist that took care of you is not available. Once you are discharged, your primary care physician will handle any further medical issues. Please note that NO REFILLS for any discharge medications will be authorized once you are discharged, as it is imperative that you return to your primary care physician (or establish a relationship with a primary care physician if you do not have one) for your aftercare needs so that they can reassess your need for medications and monitor your lab values.     Today  Chief Complaint  Patient presents with  . Wound Check  Patient is resting comfortably. Sees wound care as an outpatient.Denies any new complaints   ROS:  CONSTITUTIONAL: Denies fevers, chills. Denies any fatigue, weakness.  EYES: Denies blurry vision, double vision, eye pain. EARS, NOSE, THROAT: Denies tinnitus, ear pain, hearing loss. RESPIRATORY: Denies cough, wheeze, shortness of breath.  CARDIOVASCULAR: Denies chest pain, palpitations, edema.  GASTROINTESTINAL: Denies nausea, vomiting, diarrhea, abdominal pain. Denies bright red blood per rectum. GENITOURINARY: Denies dysuria, hematuria. ENDOCRINE: Denies nocturia or thyroid problems. HEMATOLOGIC AND LYMPHATIC: Denies easy bruising or bleeding. SKIN: Has chronic extremity wounds chronic and swelling  MUSCULOSKELETAL: Denies pain in neck, back, shoulder, knees, hips or arthritic symptoms.   NEUROLOGIC: Denies paralysis, paresthesias.  PSYCHIATRIC: Denies anxiety or depressive symptoms.   VITAL SIGNS:  Blood pressure 115/69, pulse 90, temperature 98.5 F (36.9 C), temperature source Oral, resp. rate 17, height 5\' 2"  (1.575 m), weight 91.2 kg (201 lb), SpO2 100 %.  I/O:    Intake/Output Summary (Last 24 hours) at 06/07/16 1320 Last data filed at 06/07/16 1152  Gross per 24 hour  Intake                0 ml  Output              270 ml  Net             -270 ml    PHYSICAL EXAMINATION:  GENERAL:  60 y.o.-year-old patient lying in the bed with no acute distress.  EYES: Pupils equal, round, reactive to light and accommodation. No scleral icterus. Extraocular muscles intact.  HEENT: Head atraumatic, normocephalic. Oropharynx and nasopharynx clear.  NECK:  Supple, no jugular venous distention. No thyroid enlargement, no tenderness.  LUNGS: Normal breath sounds bilaterally, no wheezing, rales,rhonchi or crepitation. No use of accessory muscles of respiration.  CARDIOVASCULAR: S1, S2 normal. No murmurs, rubs, or gallops.  ABDOMEN: Soft, non-tender, non-distended. Bowel sounds present. No organomegaly or mass.  EXTREMITIES: Bilateral lower  extremity wounds , right greater than left erythematous base no purulence No cyanosis, or clubbing. 2+ pedal edema, ,no cyanosis, or clubbing.  NEUROLOGIC: Cranial nerves II through XII are intact. Muscle strength 5/5 in all extremities. Sensation intact. Gait not checked.  PSYCHIATRIC: The patient is alert and oriented x 3.  SKIN: No obvious rash,  or ulcer.   DATA REVIEW:   CBC  Recent Labs Lab 06/07/16 0512  WBC 6.5  HGB 12.4  HCT 36.5  PLT 94*    Chemistries   Recent Labs Lab 06/07/16 0512  NA 137  K 3.4*  CL 108  CO2 26  GLUCOSE 79  BUN 18  CREATININE 0.94  CALCIUM 7.5*    Cardiac Enzymes No results for input(s): TROPONINI in the last 168 hours.  Microbiology Results  Results for orders placed or performed  during the hospital encounter of 06/02/16  Blood culture (routine x 2)     Status: None   Collection Time: 06/02/16  6:41 PM  Result Value Ref Range Status   Specimen Description BLOOD RIGHT ASSIST CONTROL  Final   Special Requests BOTTLES DRAWN AEROBIC AND ANAEROBIC 9CCAERO,2CCANA  Final   Culture NO GROWTH 5 DAYS  Final   Report Status 06/07/2016 FINAL  Final  Blood culture (routine x 2)     Status: None   Collection Time: 06/02/16  8:24 PM  Result Value Ref Range Status   Specimen Description BLOOD LEFT HAND  Final   Special Requests BOTTLES DRAWN AEROBIC AND ANAEROBIC 4CC  Final   Culture NO GROWTH 5 DAYS  Final   Report Status 06/07/2016 FINAL  Final    RADIOLOGY:  No results found.  EKG:   Orders placed or performed during the hospital encounter of 01/16/16  . EKG      Management plans discussed with the patient, family and they are in agreement.  CODE STATUS:     Code Status Orders        Start     Ordered   06/02/16 2228  Full code  Continuous     06/02/16 2227    Code Status History    Date Active Date Inactive Code Status Order ID Comments User Context   01/17/2016  2:33 AM 01/19/2016  9:16 PM Full Code [id number]  Oralia Manis, MD ED      TOTAL TIME TAKING CARE OF THIS PATIENT: 45 minutes.   Note: This dictation was prepared with Dragon dictation along with smaller phrase technology. Any transcriptional errors that result from this process are unintentional.   @MEC @  on 06/07/2016 at 1:20 PM  Between 7am to 6pm - Pager - [phone]  After 6pm go to [email or web address] - password [email or web address] [facility]  [place] [facility]  Office  [phone]  CC: Primary care physician; [phone], MD

## 2016-06-04 NOTE — [place] FL2 (Signed)
[place] MEDICAID FL2 LEVEL OF CARE SCREENING TOOL     IDENTIFICATION  Patient Name: [name] Birthdate: 06/09/1956 Sex: female Admission Date (Current Location): 06/02/2016  [place] and [place] Number:  Chiropodist and Address:  [facility], [address], [place], [place] [address]      Provider Number: [id number]  Attending Physician Name and Address:  Ramonita Lab, MD  Relative Name and Phone Number:       Current Level of Care: Hospital Recommended Level of Care: Skilled Nursing Facility Prior Approval Number:    Date Approved/Denied:   PASRR Number: [id number]  Discharge Plan: SNF    Current Diagnoses: Patient Active Problem List   Diagnosis Date Noted  . Wound infection 06/02/2016  . Chest pain 01/17/2016  . Lupus 01/17/2016  . H/O: stroke 01/17/2016    Orientation RESPIRATION BLADDER Height & Weight     Self, Time, Situation, Place  Normal Continent Weight: 201 lb (91.2 kg) Height:  5\' 2"  (157.5 cm)  BEHAVIORAL SYMPTOMS/MOOD NEUROLOGICAL BOWEL NUTRITION STATUS   (none)  (none) Continent Diet (cardiac)  AMBULATORY STATUS COMMUNICATION OF NEEDS Skin   Extensive Assist Verbally  (venous insufficiency with cellulitis of lower extremities; weeping; lesions)                       Personal Care Assistance Level of Assistance  Bathing, Dressing Bathing Assistance: Limited assistance   Dressing Assistance: Limited assistance     Functional Limitations Info   (no issues)          SPECIAL CARE FACTORS FREQUENCY  PT (By licensed PT)                    Contractures Contractures Info: Not present    Additional Factors Info  Code Status, Allergies Code Status Info: full Allergies Info: nka           Current Medications (06/04/2016):  This is the current hospital active medication list Current Facility-Administered Medications  Medication Dose Route Frequency Provider Last Rate Last Dose   . acetaminophen (TYLENOL) tablet 650 mg  650 mg Oral Q6H PRN 06/06/2016, MD       Or  . acetaminophen (TYLENOL) suppository 650 mg  650 mg Rectal Q6H PRN Oralia Manis, MD      . atorvastatin (LIPITOR) tablet 40 mg  40 mg Oral q1800 Oralia Manis, MD   40 mg at 06/03/16 1930  . clopidogrel (PLAVIX) tablet 75 mg  75 mg Oral Daily 06/05/16, MD   75 mg at 06/04/16 0942  . doxycycline (VIBRA-TABS) tablet 100 mg  100 mg Oral Q12H 06/06/16, MD   100 mg at 06/04/16 0942  . enoxaparin (LOVENOX) injection 40 mg  40 mg Subcutaneous Q24H 06/06/16, MD   40 mg at 06/03/16 2037  . furosemide (LASIX) tablet 20 mg  20 mg Oral Daily 2038, MD   20 mg at 06/04/16 0942  . HYDROcodone-acetaminophen (NORCO/VICODIN) 5-325 MG per tablet 1 tablet  1 tablet Oral Q4H PRN 06/06/16, MD   1 tablet at 06/02/16 2246  . hydroxychloroquine (PLAQUENIL) tablet 200 mg  200 mg Oral BID 2247, MD   200 mg at 06/04/16 06/06/16  . morphine 2 MG/ML injection 2 mg  2 mg Intravenous Q4H PRN 4982, MD   2 mg at 06/04/16 0542  . ondansetron (ZOFRAN) tablet 4 mg  4 mg Oral Q6H PRN  Oralia Manis, MD       Or  . ondansetron [facility]) injection 4 mg  4 mg Intravenous Q6H PRN Oralia Manis, MD   4 mg at 06/03/16 2106     Discharge Medications: Please see discharge summary for a list of discharge medications.  Relevant Imaging Results:  Relevant Lab Results:   Additional Information ss: [id number]  York Spaniel, LCSW

## 2016-06-04 NOTE — Clinical Social Work Note (Signed)
Clinical Social Work Assessment  Patient Details  Name: [name] MRN: [id number] Date of Birth: 05/23/1956  Date of referral:  06/04/16               Reason for consult:  Facility Placement                Permission sought to share information with:  Facility Medical sales representative, Family Supports Permission granted to share information::  Yes, Verbal Permission Granted  Name::        Agency::     Relationship::     Contact Information:     Housing/Transportation Living arrangements for the past 2 months:  Single Family Home Source of Information:  Patient, Adult Children (Sibling) Patient Interpreter Needed:  None Criminal Activity/Legal Involvement Pertinent to Current Situation/Hospitalization:  No - Comment as needed Significant Relationships:  Adult Children, Friend, Siblings Lives with:  Friends Do you feel safe going back to the place where you live?  Yes Need for family participation in patient care:  Yes (Comment)  Care giving concerns:  Patient resides with a friend.   Social Worker assessment / plan:  CSW received phone call from family member: [name]: [phone] this afternoon expressing concern regarding the fact that patient was to be discharged. [name] was wanting to discuss discharge needs for patient. As CSW reviewed patient's chart, it was noted that PT had assessed patient this afternoon and had put in a recommendation of STR. CSW informed Ms. Lockett that I would need to go to discuss discharge recommendations with patient and see how patient wished to proceed. [name] verbalized understanding and appreciation for CSW looking into things further in order to come up with a safe discharge plan. Discharge was cancelled by physician. CSW spoke with patient this afternoon and she was tearful as she was upset that her son: [name]: [phone] was upset with her. She stated that she was worried that he did not want her around him or his  children. CSW provided reassurance. CSW then discussed discharge planning and patient is in agreement with pursuing STR. Patient states she lives with a friend and her son take her to all of her appointments. As she was talking to CSW, patient's son entered her room. Patient's son was updated with patient's permission. Patient's son stated that he was upset that he is the one that provides patient with transportation and runs her errands and that she nor her sister had informed him of how sick she had become or what was medically going on. CSW spoke with patient and her son and patient is willing to give patient her password so that he can get information here at the hospital.   Prior to speaking with patient, CSW contacted [name] with Medicare Tedd Sias (patient's insurance): [phone] and informed her of the situation. [name] stated that it was too late for her to review any information to provide an auth for STR today and that they are closed over the weekend so she would not be able to provide authorization if approved until at least Monday. CSW updated patient's sister, patient, and patient's son regarding this. Patient's physician is aware as well that patient will be here through the weekend. CSW has faxed information for [name] to review on Monday when she returns to work at: [phone]. Bedsearch to be initiated. Patient requests bedsearch be conducted in [place]. CSW to update patient, patient's son, and patient's sister on Monday morning.   Employment status:  Disabled (  Comment on whether or not currently receiving Disability) Insurance information:  Managed Medicare PT Recommendations:  Skilled Nursing Facility Information / Referral to community resources:     Patient/Family's Response to care:  Patient's family expressed appreciation for CSW assistance as did patient.  Patient/Family's Understanding of and Emotional Response to Diagnosis, Current Treatment, and Prognosis:  Patient  was very tearful initially but was smiling at end of conversation and is in agreement with STR.  Emotional Assessment Appearance:  Appears stated age Attitude/Demeanor/Rapport:   (tearful but cooperative and pleasant) Affect (typically observed):  Accepting, Adaptable, Tearful/Crying Orientation:  Oriented to Self, Oriented to Place, Oriented to  Time, Oriented to Situation Alcohol / Substance use:  Not Applicable Psych involvement (Current and /or in the community):  No (Comment)  Discharge Needs  Concerns to be addressed:  Care Coordination Readmission within the last 30 days:  Yes Current discharge risk:  None Barriers to Discharge:  No Barriers Identified   [name] Spaniel, LCSW 06/04/2016, 5:13 PM

## 2016-06-04 NOTE — Care Management (Signed)
Patient admitted with wound infection.  Patient lives at home with a roommate.  Son provides transportation.  PCP [name].  Pharmacy = [facility].  Patient goes to the wound center for dressing changes.  Patient has follow up scheduled next week.  Patient also have follow up scheduled for outpatient ID.  No RNCM needs identified.

## 2016-06-05 LAB — BASIC METABOLIC PANEL
Anion gap: 8 (ref 5–15)
BUN: 18 mg/dL (ref 6–20)
CHLORIDE: 109 mmol/L (ref 101–111)
CO2: 21 mmol/L — AB (ref 22–32)
CREATININE: 1.02 mg/dL — AB (ref 0.44–1.00)
Calcium: 7.9 mg/dL — ABNORMAL LOW (ref 8.9–10.3)
GFR calc non Af Amer: 58 mL/min — ABNORMAL LOW (ref >60)
Glucose, Bld: 134 mg/dL — ABNORMAL HIGH (ref 65–99)
POTASSIUM: 3.1 mmol/L — AB (ref 3.5–5.1)
Sodium: 138 mmol/L (ref 135–145)

## 2016-06-05 NOTE — Progress Notes (Signed)
[facility] Physicians - [place] at [facility]   PATIENT NAME: [name]    MR#:  [id number]  DATE OF BIRTH:  [birth year]  SUBJECTIVE:  CHIEF COMPLAINT:  Patient is resting comfortably. Has chronic lower extremity edema Felt comfortable to be discharged home. Son is concerned that her wounds are contagious and refused to take her home PT recommended rehabilitation.  REVIEW OF SYSTEMS:  CONSTITUTIONAL: No fever, fatigue or weakness.  EYES: No blurred or double vision.  EARS, NOSE, AND THROAT: No tinnitus or ear pain.  RESPIRATORY: No cough, shortness of breath, wheezing or hemoptysis.  CARDIOVASCULAR: No chest pain, orthopnea, edema.  GASTROINTESTINAL: No nausea, vomiting, diarrhea or abdominal pain.  GENITOURINARY: No dysuria, hematuria.  ENDOCRINE: No polyuria, nocturia,  HEMATOLOGY: No anemia, easy bruising or bleeding SKIN: Has chronic lower extremity edema , lower leg wounds MUSCULOSKELETAL: No joint pain or arthritis.   NEUROLOGIC: No tingling, numbness, weakness.  PSYCHIATRY: No anxiety or depression.   DRUG ALLERGIES:  No Known Allergies  VITALS:  Blood pressure 129/64, pulse (!) 106, temperature 98.6 F (37 C), temperature source Oral, resp. rate (!) 24, height 5\' 2"  (1.575 m), weight 91.2 kg (201 lb), SpO2 95 %.  PHYSICAL EXAMINATION:  GENERAL:  60 y.o.-year-old patient lying in the bed with no acute distress.  EYES: Pupils equal, round, reactive to light and accommodation. No scleral icterus. Extraocular muscles intact.  HEENT: Head atraumatic, normocephalic. Oropharynx and nasopharynx clear.  NECK:  Supple, no jugular venous distention. No thyroid enlargement, no tenderness.  LUNGS: Normal breath sounds bilaterally, no wheezing, rales,rhonchi or crepitation. No use of accessory muscles of respiration.  CARDIOVASCULAR: S1, S2 normal. No murmurs, rubs, or gallops.  ABDOMEN: Soft, nontender, nondistended. Bowel sounds present. No organomegaly or mass.   EXTREMITIES: Bilateral lower extremity wounds , right greater than left erythematous base no purulence No cyanosis, or clubbing.  NEUROLOGIC: Cranial nerves II through XII are intact. Muscle strength 5/5 in all extremities. Sensation intact. Gait not checked.  PSYCHIATRIC: The patient is alert and oriented x 3.  SKIN: No obvious rash, lesion, or ulcer.    LABORATORY PANEL:   CBC  Recent Labs Lab 06/03/16 0550  WBC 6.3  HGB 13.9  HCT 42.0  PLT 96*   ------------------------------------------------------------------------------------------------------------------  Chemistries   Recent Labs Lab 06/05/16 0921  NA 138  K 3.1*  CL 109  CO2 21*  GLUCOSE 134*  BUN 18  CREATININE 1.02*  CALCIUM 7.9*   ------------------------------------------------------------------------------------------------------------------  Cardiac Enzymes No results for input(s): TROPONINI in the last 168 hours. ------------------------------------------------------------------------------------------------------------------  RADIOLOGY:  No results found.  EKG:   Orders placed or performed during the hospital encounter of 01/16/16  . EKG    ASSESSMENT AND PLAN:    # Chronic lymphedema with bilateral lower extremity open Wound infection - right greater than left lower extremity wounds  Given iV vancomycin , changed to by mouth doxycycline per ID recommendations  UNNA boot and encouraged the patient to elevate legs  Appreciate wound care recommendations .wound cultures during next Dressing changes Pain management with the Norco as needed CRP normal range at 1.5, sedimentation rate 14 normal  #Bilateral lower extremity edema BNP 57 normal range Continue home medication Lasix 20 g by mouth once daily encouraged patient to keep her legs elevated  #Recent diagnosis of hepatitis C Consult infectious diseases for establishment of care and outpatient GI follow-up   #Lupus - on daily  Plaquenil, continue    # H/O: stroke - continue  home dose Plavix, Lipitor 40 mg by mouth once daily  PT has recommended rehabilitation-follow-up with social worker possibly on Monday  All the records are reviewed and case discussed with Care Management/Social Workerr. Management plans discussed with the patient, family and they are in agreement.  CODE STATUS: fc   TOTAL TIME TAKING CARE OF THIS PATIENT: 35 minutes.   POSSIBLE D/C IN 1-2DAYS, DEPENDING ON CLINICAL CONDITION.  Note: This dictation was prepared with Dragon dictation along with smaller phrase technology. Any transcriptional errors that result from this process are unintentional.   Ramonita Lab M.D on 06/04/16  Between 7am to 6pm - Pager - [phone] After 6pm go to [email or web address] - password [email or web address] [facility]  [place] [facility]  Office  [phone]  CC: Primary care physician; Sherrie Mustache, MD

## 2016-06-05 NOTE — Progress Notes (Signed)
[facility] - [place] [facility]   PATIENT NAME: [name]    MR#:  [id number]  DATE OF BIRTH:  1955-11-29  SUBJECTIVE:  CHIEF COMPLAINT:  Patient is resting comfortably. Has chronic lower extremity edema No new complaints  REVIEW OF SYSTEMS:  CONSTITUTIONAL: No fever, fatigue or weakness.  EYES: No blurred or double vision.  EARS, NOSE, AND THROAT: No tinnitus or ear pain.  RESPIRATORY: No cough, shortness of breath, wheezing or hemoptysis.  CARDIOVASCULAR: No chest pain, orthopnea, edema.  GASTROINTESTINAL: No nausea, vomiting, diarrhea or abdominal pain.  GENITOURINARY: No dysuria, hematuria.  ENDOCRINE: No polyuria, nocturia,  HEMATOLOGY: No anemia, easy bruising or bleeding SKIN: Has chronic lower extremity edema , lower leg wounds MUSCULOSKELETAL: No joint pain or arthritis.   NEUROLOGIC: No tingling, numbness, weakness.  PSYCHIATRY: No anxiety or depression.   DRUG ALLERGIES:  No Known Allergies  VITALS:  Blood pressure 129/64, pulse (!) 106, temperature 98.6 F (37 C), temperature source Oral, resp. rate (!) 24, height 5\' 2"  (1.575 m), weight 91.2 kg (201 lb), SpO2 95 %.  PHYSICAL EXAMINATION:  GENERAL:  60 y.o.-year-old patient lying in the bed with no acute distress.  EYES: Pupils equal, round, reactive to light and accommodation. No scleral icterus. Extraocular muscles intact.  HEENT: Head atraumatic, normocephalic. Oropharynx and nasopharynx clear.  NECK:  Supple, no jugular venous distention. No thyroid enlargement, no tenderness.  LUNGS: Normal breath sounds bilaterally, no wheezing, rales,rhonchi or crepitation. No use of accessory muscles of respiration.  CARDIOVASCULAR: S1, S2 normal. No murmurs, rubs, or gallops.  ABDOMEN: Soft, nontender, nondistended. Bowel sounds present. No organomegaly or mass.  EXTREMITIES: Bilateral lower extremity wounds , right greater than left erythematous base no purulence No cyanosis, or  clubbing.  NEUROLOGIC: Cranial nerves II through XII are intact. Muscle strength 5/5 in all extremities. Sensation intact. Gait not checked.  PSYCHIATRIC: The patient is alert and oriented x 3.  SKIN: No obvious rash, lesion, or ulcer.    LABORATORY PANEL:   CBC  Recent Labs Lab 06/03/16 0550  WBC 6.3  HGB 13.9  HCT 42.0  PLT 96*   ------------------------------------------------------------------------------------------------------------------  Chemistries   Recent Labs Lab 06/05/16 0921  NA 138  K 3.1*  CL 109  CO2 21*  GLUCOSE 134*  BUN 18  CREATININE 1.02*  CALCIUM 7.9*   ------------------------------------------------------------------------------------------------------------------  Cardiac Enzymes No results for input(s): TROPONINI in the last 168 hours. ------------------------------------------------------------------------------------------------------------------  RADIOLOGY:  No results found.  EKG:   Orders placed or performed during the hospital encounter of 01/16/16  . EKG    ASSESSMENT AND PLAN:    # Chronic lymphedema with bilateral lower extremity open Wound infection - right greater than left lower extremity wounds  Given iV vancomycin , changed to by mouth doxycycline per ID recommendations  UNNA boot and encouraged the patient to elevate legs  Appreciate wound care recommendations .wound cultures during next Dressing changes Pain management with the Norco as needed CRP normal range at 1.5, sedimentation rate 14 normal  #Bilateral lower extremity edema BNP 57 normal range Continue home medication Lasix 20 g by mouth once daily encouraged patient to keep her legs elevated  #Recent diagnosis of hepatitis C Consult infectious diseases for establishment of care and outpatient GI follow-up   #Lupus - on daily Plaquenil, continue    # H/O: stroke - continue home dose Plavix, Lipitor 40 mg by mouth once daily  PT has recommended  rehabilitation-follow-up with social worker possibly on  Monday  All the records are reviewed and case discussed with Care Management/Social Workerr. Management plans discussed with the patient, family and they are in agreement.  CODE STATUS: fc   TOTAL TIME TAKING CARE OF THIS PATIENT: 35 minutes.   POSSIBLE D/C IN 1-2DAYS, DEPENDING ON CLINICAL CONDITION.  Note: This dictation was prepared with Dragon dictation along with smaller phrase technology. Any transcriptional errors that result from this process are unintentional.   Ramonita Lab M.D on 06/04/16  Between 7am to 6pm - Pager - [phone] After 6pm go to [email or web address] - password [email or web address] [facility]  [place] [facility]  Office  [phone]  CC: Primary care physician; Sherrie Mustache, MD

## 2016-06-05 NOTE — Progress Notes (Signed)
Patient's insurance [employer] is closed this weekend and will not be able to provide insurance authorization for STR until Monday at the earliest. MD aware. CSW will continue to follow and assist.  Woodroe Mode, MSW, LCSW, LCAS-A Clinical Social Worker [phone]

## 2016-06-06 MED ORDER — HYDROMORPHONE HCL 1 MG/ML IJ SOLN
0.5000 mg | Freq: Once | INTRAMUSCULAR | Status: AC
  Administered 2016-06-06: 0.5 mg via INTRAVENOUS
  Filled 2016-06-06: qty 1

## 2016-06-06 MED ORDER — DOCUSATE SODIUM 100 MG PO CAPS
100.0000 mg | ORAL_CAPSULE | Freq: Two times a day (BID) | ORAL | Status: DC
  Administered 2016-06-06 (×2): 100 mg via ORAL
  Filled 2016-06-06 (×2): qty 1

## 2016-06-06 NOTE — Progress Notes (Signed)
[facility] - [place] at [facility]   PATIENT NAME: [name]    MR#:  [id number]  DATE OF BIRTH:  1956/04/30  SUBJECTIVE:  CHIEF COMPLAINT:  Patient is resting comfortably. Has chronic lower extremity edema No new complaints,No overnight events per nurse's report, patient is disappointed as she has to go to SNF, but states she has an option  REVIEW OF SYSTEMS:  CONSTITUTIONAL: No fever, fatigue or weakness.  EYES: No blurred or double vision.  EARS, NOSE, AND THROAT: No tinnitus or ear pain.  RESPIRATORY: No cough, shortness of breath, wheezing or hemoptysis.  CARDIOVASCULAR: No chest pain, orthopnea, edema.  GASTROINTESTINAL: No nausea, vomiting, diarrhea or abdominal pain.  GENITOURINARY: No dysuria, hematuria.  ENDOCRINE: No polyuria, nocturia,  HEMATOLOGY: No anemia, easy bruising or bleeding SKIN: Has chronic lower extremity edema , lower leg wounds MUSCULOSKELETAL: No joint pain or arthritis.   NEUROLOGIC: No tingling, numbness, weakness.  PSYCHIATRY: No anxiety or depression.   DRUG ALLERGIES:  No Known Allergies  VITALS:  Blood pressure (!) 130/59, pulse (!) 102, temperature 98.3 F (36.8 C), temperature source Oral, resp. rate 18, height 5\' 2"  (1.575 m), weight 91.2 kg (201 lb), SpO2 97 %.  PHYSICAL EXAMINATION:  GENERAL:  59 y.o.-year-old patient lying in the bed with no acute distress.  EYES: Pupils equal, round, reactive to light and accommodation. No scleral icterus. Extraocular muscles intact.  HEENT: Head atraumatic, normocephalic. Oropharynx and nasopharynx clear.  NECK:  Supple, no jugular venous distention. No thyroid enlargement, no tenderness.  LUNGS: Normal breath sounds bilaterally, no wheezing, rales,rhonchi or crepitation. No use of accessory muscles of respiration.  CARDIOVASCULAR: S1, S2 normal. No murmurs, rubs, or gallops.  ABDOMEN: Soft, nontender, nondistended. Bowel sounds present. No organomegaly or mass.   EXTREMITIES: Bilateral lower extremity wounds , right greater than left erythematous base no purulence No cyanosis, or clubbing.  NEUROLOGIC: Cranial nerves II through XII are intact. Muscle strength 5/5 in all extremities. Sensation intact. Gait not checked.  PSYCHIATRIC: The patient is alert and oriented x 3.  SKIN: No obvious rash, lesion, or ulcer.    LABORATORY PANEL:   CBC  Recent Labs Lab 06/03/16 0550  WBC 6.3  HGB 13.9  HCT 42.0  PLT 96*   ------------------------------------------------------------------------------------------------------------------  Chemistries   Recent Labs Lab 06/05/16 0921  NA 138  K 3.1*  CL 109  CO2 21*  GLUCOSE 134*  BUN 18  CREATININE 1.02*  CALCIUM 7.9*   ------------------------------------------------------------------------------------------------------------------  Cardiac Enzymes No results for input(s): TROPONINI in the last 168 hours. ------------------------------------------------------------------------------------------------------------------  RADIOLOGY:  No results found.  EKG:   Orders placed or performed during the hospital encounter of 01/16/16  . EKG    ASSESSMENT AND PLAN:    # Chronic lymphedema with bilateral lower extremity open Wound infection - right greater than left lower extremity wounds  Given iV vancomycin , changed to by mouth doxycycline per ID recommendations  UNNA boot and encouraged the patient to elevate legs  Appreciate wound care recommendations .wound cultures during next Dressing changes,Anticipating next dressing change in a.m. Pain management with the Norco as needed CRP normal range at 1.5, sedimentation rate 14 normal  #Bilateral lower extremity edema BNP 57 normal range Continue home medication Lasix 20 g by mouth once daily encouraged patient to keep her legs elevated  #Recent diagnosis of hepatitis C Consult infectious diseases for establishment of care and outpatient  GI follow-up   #Lupus - on daily Plaquenil, continue    #  H/O: stroke - continue home dose Plavix, Lipitor 40 mg by mouth once daily  PT has recommended rehabilitation-follow-up with social worker possibly on Monday  All the records are reviewed and case discussed with Care Management/Social Workerr. Management plans discussed with the patient, family and they are in agreement.  CODE STATUS: fc   TOTAL TIME TAKING CARE OF THIS PATIENT: 32 minutes.   POSSIBLE D/C IN AM DAYS, DEPENDING ON CLINICAL CONDITION.  Note: This dictation was prepared with Dragon dictation along with smaller phrase technology. Any transcriptional errors that result from this process are unintentional.   Ramonita Lab M.D on 06/04/16  Between 7am to 6pm - Pager - [phone] After 6pm go to [email or web address] - password [email or web address] [facility]  [place]  [facility]ospital[facility]  Office  [phone]  CC: Primary care physician; Sherrie Mustache, MD

## 2016-06-07 ENCOUNTER — Ambulatory Visit: Payer: Medicare Other

## 2016-06-07 LAB — CULTURE, BLOOD (ROUTINE X 2)
CULTURE: NO GROWTH
Culture: NO GROWTH

## 2016-06-07 LAB — BASIC METABOLIC PANEL
Anion gap: 3 — ABNORMAL LOW (ref 5–15)
BUN: 18 mg/dL (ref 6–20)
CHLORIDE: 108 mmol/L (ref 101–111)
CO2: 26 mmol/L (ref 22–32)
Calcium: 7.5 mg/dL — ABNORMAL LOW (ref 8.9–10.3)
Creatinine, Ser: 0.94 mg/dL (ref 0.44–1.00)
Glucose, Bld: 79 mg/dL (ref 65–99)
POTASSIUM: 3.4 mmol/L — AB (ref 3.5–5.1)
SODIUM: 137 mmol/L (ref 135–145)

## 2016-06-07 LAB — CBC
HEMATOCRIT: 36.5 % (ref 35.0–47.0)
Hemoglobin: 12.4 g/dL (ref 12.0–16.0)
MCH: 32.4 pg (ref 26.0–34.0)
MCHC: 34.1 g/dL (ref 32.0–36.0)
MCV: 94.9 fL (ref 80.0–100.0)
PLATELETS: 94 10*3/uL — AB (ref 150–440)
RBC: 3.84 MIL/uL (ref 3.80–5.20)
RDW: 14.3 % (ref 11.5–14.5)
WBC: 6.5 10*3/uL (ref 3.6–11.0)

## 2016-06-07 MED ORDER — HYDROCODONE-ACETAMINOPHEN 5-325 MG PO TABS: 1.0000 | ORAL_TABLET | Freq: Four times a day (QID) | ORAL | 0 refills | Status: DC | PRN

## 2016-06-07 MED ORDER — DOCUSATE SODIUM 100 MG PO CAPS: 100.0000 mg | ORAL_CAPSULE | Freq: Two times a day (BID) | ORAL | 0 refills | Status: AC | PRN

## 2016-06-07 MED ORDER — DOXYCYCLINE HYCLATE 100 MG PO TABS: 100.0000 mg | ORAL_TABLET | Freq: Two times a day (BID) | ORAL | 0 refills | Status: AC

## 2016-06-07 NOTE — Care Management Note (Addendum)
Case Management Note  Patient Details  Name: [name] MRN: [id number] Date of Birth: 09/19/1955  Subjective/Objective:    Discharge to home today. Spoke with patients sister, [name] [phone]). Patient has Medicaid [employer] out of state and [place] medicaid ([id number]). PCP, Dr. Dario Guardian will only sign orders for [facility]. [facility] reports patient only has out of state benefits so they are not able to pick patient up for home health.  Medicaid will not over with out of state benefits with another insurance as primary.  Medicare benefits to start Nov. 1. [name]  states patient cant afford any copays for home health or equipment with other agencies. Updated rena lockett  Patient has chronic lymphedema and open wounds that requires lowe and upper extremities to be positioned in ways not feasible with a normal bed. Head must be elevated at least 30 degrees or patient will develop increased pressure to wound areas causing additional wound care concerns and lymphedema  Action/Plan: Ordered RW and hospital bed from [facility].   Expected Discharge Date:    06/07/2016              Expected Discharge Plan:  Home w Home Health Services  In-House Referral:     Discharge planning Services  CM Consult  Post Acute Care Choice:  Durable Medical Equipment, Home Health Choice offered to:  Adult Children  DME Arranged:  Walker, Hospital bed DME Agency:  [facility]  HH Arranged:   [facility] Agency:    Status of Service:  Completed, signed off  If discussed at Long Length of Stay Meetings, dates discussed:    Additional Comments:  Marily Memos, RN 06/07/2016, 11:17 AM

## 2016-06-07 NOTE — Care Management (Signed)
Patient has Medicare and Medicaid. Spoke with Orvilla Fus to confirm.

## 2016-06-07 NOTE — Progress Notes (Signed)
Chaplain rounded the patient who looked distressed. Chaplain offered words of consolation to the Patient and prayers for healing. Patient's spirit was lifted and she asked Chaplain to visit her again soon. Note: Chaplain to make a follow-up with this patient if she is still in hospital.     06/07/16 1700  Clinical Encounter Type  Visited With Patient  Visit Type Initial  Referral From Nurse  Spiritual Encounters  Spiritual Needs Prayer  Stress Factors  Patient Stress Factors Health changes;Other (Comment)

## 2016-06-07 NOTE — Clinical Social Work Note (Signed)
CSW received phone call from a Clydie Braun with Medicare [employer] stating that patient is still active with their insurance and that it is a [place] policy. Patient would have out of network benefits to pay if patient goes to a SNF in [place]. Clydie Braun explained that it is not noted anywhere on the policy that patient or family called and did a change of address for patient. Clydie Braun stated that the [place] Medicare [employer]vant[employer] plan would not follow patient and cover patient if she now resided in [place] but would have covered her if she had a [place] residence and was visiting [place] it would pay for an emergent stay. Patient's residence is now in [place]. CSW contacted Ms. Lockett again to update regarding this new found information. Patient's sister stated that she called Dr. Aurelio Brash office and will call the RN CM back to inform that [name]'s office uses [facility]. Son will be her transportation home. York Spaniel MSW,LCSW

## 2016-06-07 NOTE — Progress Notes (Signed)
Pt was alert and sitting in chair next to bed. Spoke of son coming by after work. Retired from doing childcare work for many years. CH offered prayer.   06/07/16 1000  Clinical Encounter Type  Visited With Patient  Visit Type Spiritual support  Spiritual Encounters  Spiritual Needs Prayer;Emotional  Stress Factors  Patient Stress Factors Health changes

## 2016-06-07 NOTE — Progress Notes (Signed)
Physical Therapy Treatment Patient Details Name: [name] MRN: [id number] DOB: 1956-07-23 Today's Date: 06/07/2016    History of Present Illness Pt admitted for possible infected wound infection. Pt with complaints of chronic LE wounds that developed purelent drainage over past few days. Pt with history of interstital lung disease, lupus, and CGA.    PT Comments    Pt is making gradual progress towards goals. Able to ambulate this date using RW, however not completely back to previous level of function. Improved balance this date, however does need RW for pain tolerance as she complains of R knee pain with mobility. Requires physical assist with bed mobility/transfers this date. Pt agreeable to therapy and willing to sit in recliner. Safe technique with toilet training.  Follow Up Recommendations  SNF     Equipment Recommendations       Recommendations for Other Services       Precautions / Restrictions Precautions Precautions: Fall Restrictions Weight Bearing Restrictions: No    Mobility  Bed Mobility Overal bed mobility: Needs Assistance Bed Mobility: Supine to Sit     Supine to sit: Min assist     General bed mobility comments: Able to initiate bed mobility, however unable to raise R LE off bed to scoot towards EOB without assist. Once seated at EOB, pt able to sit with cga.  Transfers Overall transfer level: Needs assistance Equipment used: Rolling walker (2 wheeled) Transfers: Sit to/from Stand Sit to Stand: Min assist         General transfer comment: Pt able to push from seated surface, however does need assist to fully stand upright. RW used for assistance and safety. Once standing, pt with no LOB  Ambulation/Gait Ambulation/Gait assistance: Min guard Ambulation Distance (Feet): 20 Feet Assistive device: Rolling walker (2 wheeled) Gait Pattern/deviations: Step-to pattern     General Gait Details: Slow shuffling gait pattern with decreased R  step length. Pt requires cues to keep RW close to body to promote stability.   Stairs            Wheelchair Mobility    Modified Rankin (Stroke Patients Only)       Balance                                    Cognition Arousal/Alertness: Awake/alert Behavior During Therapy: WFL for tasks assessed/performed Overall Cognitive Status: Within Functional Limits for tasks assessed                      Exercises Other Exercises Other Exercises: Pt ambulated to [facility] with min assist with cga for hygiene while performing sit/stand training Other Exercises: Pt performed seated ther-ex including ankle pumps, quad sets, SLRs, and hip abd/add. All ther-ex performed x 10 reps with cga.    General Comments        Pertinent Vitals/Pain Pain Assessment: 0-10 Pain Score: 10-Worst pain ever Pain Location: R knee Pain Descriptors / Indicators: Discomfort Pain Intervention(s): Limited activity within patient's tolerance    Home Living                      Prior Function            PT Goals (current goals can now be found in the care plan section) Acute Rehab PT Goals Patient Stated Goal: to get stronger PT Goal Formulation: With patient Time For Goal Achievement: 06/18/16  Potential to Achieve Goals: Good Progress towards PT goals: Progressing toward goals    Frequency    Min 2X/week      PT Plan Current plan remains appropriate    Co-evaluation             End of Session Equipment Utilized During Treatment: Gait belt Activity Tolerance: Patient tolerated treatment well Patient left: in chair;with chair alarm set     Time: 0947-1010 PT Time Calculation (min) (ACUTE ONLY): 23 min  Charges:  $Gait Training: 8-22 mins $Therapeutic Exercise: 8-22 mins                    G Codes:      Thoms Barthelemy 06-20-2016, 10:48 AM  Elizabeth Palau, PT, DPT [phone]

## 2016-06-07 NOTE — Clinical Social Work Note (Signed)
CSW was able to receive a bed offer for straight medicaid admission from [facility]. Patient had already discharged home when CSW returned to the unit and her son transported her. CSW contacted Ms. Lockett and informed her of the bed offer in the event that patient is not successful at home. CSW extended assistance if she needed to call CSW.  York Spaniel MSW,LCSW [phone]

## 2016-06-07 NOTE — Clinical Social Work Note (Signed)
CSW informed by Kathie Rhodes at [facility] that they no longer have patient actively enrolled as a member. RN CM contacted the registration department here at [facility] and was informed that patient has traditional medicare. Patient is currently medicare observation and thus medicare will not pay for patient to go for rehab in a SNF. Patient has medicaid as a secondary and could be placed under her medicaid but she would have to go out of county as there are no medicaid beds available within [place]. This was all explained to patient's sister: [name] and to patient's son: Tinnie Gens this morning. CSW explained the option of home health at this time and patient's sister stated that as long as medicare will cover home health, she would be fine with her sister returning home with home health. The unit director, Calton Dach, was with CSW when CSW discussed the above on the phone with [name] and Tinnie Gens.  York Spaniel MSW,LCSW [phone]

## 2016-06-07 NOTE — Care Management (Addendum)
Received call from [facility] requesting assistance regarding concern for this patient's needs. This RNCM spoke with Dr. Aurelio Brash office [phone] regarding patient status. Dr. Aurelio Brash office sent patient to the hospital per the receptionist that answered the phone. Patient goes to [facility] which has been confirmed by this [facility] [phone]. She has an appointment tomorrow and Friday per Wound care center. PT is recommending SNF related to patient's difficulty getting out of bed and inability to raise RLE or scoot towards edge of bed. After patient got up with rolling walker though patient was minimal guard and was able to walk with walker 20 feet. Patient plans to return home with son by private vehicle per [facility] and MD note. I spoke with RNCM regarding making sure that her son/sister knows the plan as patient does not have PT coverage under Medicaid and has co-pays with out-of-state insurance- which patient can still pay out of pocket for if she choses. However, Dr. Dario Guardian only uses [facility] and they have already declined patient due to out of network insurance. Patient has discharge order to home on this date and may resume previous outpatient care; if family feels that patient needs long-term-care they will need to go through Dr. Aurelio Brash office to make those arrangements. If the patient does not need long-term-placement then the family will need to make arrangements for patient in the home. Medicare pending in November per [facility].  RNCM agrees to talk to [facility] about this concern. Message left at Dr. Annice Needy office per Dr. Aurelio Brash office request for patient to follow up with Dr. Wyn Quaker [phone].  This RNCM signing off.

## 2016-06-07 NOTE — Progress Notes (Signed)
Patient discharge teaching given, including activity, diet, follow-up appoints, and medications. Patient verbalized understanding of all discharge instructions. IV access was d/c'd.  Wound dressings were changed, RN sent down wound culture swab. Vitals are stable. Skin is intact except as charted in most recent assessments. Pt to be escorted out by volunteer, to be driven home by son jeffery. Home Health set up.   Karsten Ro

## 2016-06-09 ENCOUNTER — Ambulatory Visit: Payer: Medicare Other | Admitting: Internal Medicine

## 2016-06-11 ENCOUNTER — Encounter: Payer: Medicare Other | Admitting: Surgery

## 2016-06-11 DIAGNOSIS — I87331 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 lower extremity: Secondary | ICD-10-CM | POA: Diagnosis not present

## 2016-06-12 LAB — AEROBIC CULTURE  (SUPERFICIAL SPECIMEN)

## 2016-06-12 LAB — AEROBIC CULTURE W GRAM STAIN (SUPERFICIAL SPECIMEN)

## 2016-06-12 NOTE — Progress Notes (Addendum)
EMMALEA, TREANOR ([id number]) Visit Report for 06/11/2016 Chief Complaint Document Details Patient Name: [name], [name] Date of Service: 06/11/2016 2:15 PM Medical Record Number: [id number] Patient Account Number: [id number] Date of Birth/Sex: 1955/10/24 (60 y.o. Female) Treating RN: Huel Coventry Primary Care Physician: Sherrie Mustache Other Clinician: Referring Physician: Sherrie Mustache Treating Physician/Extender: Rudene Re in Treatment: 24 Information Obtained from: Patient Chief Complaint Patient is here for follow-up regarding her bilateral lower extremity venous stasis ulcers Electronic Signature(s) Signed: 06/11/2016 3:17:52 PM By: Evlyn Kanner MD, FACS Entered By: Evlyn Kanner on 06/11/2016 15:17:52 [name], [name] ([id number]) -------------------------------------------------------------------------------- HPI Details Patient Name: [name], [name] Date of Service: 06/11/2016 2:15 PM Medical Record Number: [id number] Patient Account Number: [id number] Date of Birth/Sex: Sep 16, 1955 (60 y.o. Female) Treating RN: Huel Coventry Primary Care Physician: Sherrie Mustache Other Clinician: Referring Physician: Sherrie Mustache Treating Physician/Extender: Rudene Re in Treatment: 24 History of Present Illness HPI Description: 12/23/15; this is a 60 year old lady who recently relocated back to [place] from [place]. She is staying with a family friend previously lived with a daughter in [place]. 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 She is on chronic prednisone she is not a diabetic. Dose of prednisone is 10 mg. The patient tells me that roughly a week ago she developed a fairly substantial blister over this area that then ruptured. She was seen in the emergency room on 5/7 an x-ray of the leg was negative she was put on Septra although I don't think any cultures were done. She also tells  me she has had chronic edema in her legs for a number of years. She had a similar presentation to currently in the right lateral lower leg roughly a year ago that she was able to heal on her own. As noted she has systemic lupus but does not have lupus nephritis according to the patient. She has a lot of edema in her lower legs. The ABI's could not be obtained. 12/31/15; the patient's insurance which is [place] makes her out of network for any home health therefore we have been changing her dressing in our facility. I reviewed her trip to the ER on 12/21/2015 her creatinine was within the normal range hemoglobin and white count normal. Culture of the wound was negative. X-ray of the leg showed soft tissue edema no foreign bodies. We have been dressing this with Aquacel Ag due to the amount of ongoing drainage 01/07/16; the patient had her arterial studies this morning I don't have these results area she comes back in to have [place] rewrap since she doesn't have access to home health 01/14/16 I still do not have the results of her arterial studies. Our nurse correctly pointed out that we've been wrapping the left leg without an open wound I think this had to do with the fact that she had so much edema when she came in I was concerned about leaving this unwrapped. 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 We left her left leg unwrapped last week as she has no open wound here today she has extensive edema here. We did order stockings however I believe the company called but they have not called them back. She comes back in today with extensive edema in the left leg. We have her arterial studies which  showed triphasic waves and all locations tested including her posterior tibial and anterior tibial arteries bilaterally. Report listed no hemodynamically significant bilateral  lower extremity arterial stenosis. She has her venous studies later this month. I'm going to rewrap the left leg due to the extent of the edema, transitioning her into the stocking we ordered last week as soon as one is available 01/27/16 the patient arrived today with pooled serosanguineous drainage over the wound it would not absorb into her Aquacel Ag which is somewhat on. In spite of this the dimensions of her wound are down considerably and the wound bed looks healthy without any need for debridement. She is obtained her juxtalite stockings the left leg which has no open wound. We are waiting for the right leg wound to get smaller before ordering her one for the right leg 02/03/16; patient wound looked improved. No debridement was required. We've been using the juxtalite [name], [name] ([id number]) stocking on the left leg which is no open wound. 02/11/16 wound is remarkably better. No debridement was required. Healthy rim of epithelialization. We have been using Aquacel Ag, we'll try to close this down with RTD over the next 2 weeks 02/18/16 wound continues to improve down a centimeter in length and width. No debridement was required. Advancing epithelialization. We started RTD last week 02/25/16: pt brought juxtalite for right lower leg today. nurse reports RTD was dry and adhered to the wound. required removal by NS. wound bed with 100% healthy red granulation tissue. denies systemic s/s of infection. 03/02/16; Considerable improvement. no debridement. 03/09/16; wound bed is very vascular no major change in dimensions. Continuing with RTD 03/16/16; unfortunately no major change in dimensions however the wound bed looks very healthy. I will continue with RTD for one more week. If his stalls consider change to collagen. 03/30/2016 -- she's been using juxta lites on her left lower extremity but her lymphedema is very significantly increased and she is not tolerating the juxta lites the edema  has increased symptoms significantly 04/06/16; apparently the RTD started sticking to the wound therefore she was changed to sorbact. Her 4-layer wrap appears to have slipped one third of the way down her leg. She was tolerating the juxtalite on the left leg, I don't see the need to wrap the left leg. She is not a candidate for external compression pumps secondary to insurance issues. 04/13/16; wound is actually measuring larger. Has been using Sorbact 04/20/16; although the wound dimensions of really not changed all that much, the better this certainly looks healthy and there is advancing epithelialization from both sides. No evidence of infection 04/27/16 changed to [facility] last week and the dimensions appear to be a lot better. No evidence of infection. She does not put the juxtalite stocking on the left leg on correctly nevertheless she has never had an open wound here. I am really uncertain whether we ever got 2 to juxtalite stockings for her. The wound started as a blister 05/04/16; finally with Hydrofera Blue last week the area on the right leg has closed over. However in inspection of her left leg she now has a small wound on the posterior aspect of the left leg. She has been using juxta lites on this area but probably not really applying them correctly and with enough tension. She is not a candidate for compression pumps secondary to insurance issues 05/11/16; the area on her right anterior leg and closed over last week however we noted an area on her left  posterior calf. I elected to wrap both her legs in anticipation we be able to transition her to juxtalite stockings. She arrives today the wraps and fallen down on both sides. The area on the right anterior leg has reopened she also had a very tense blister on the right anterior lateral leg just above her wound which I elected to excise. The area on the posterior left leg is roughly about the same 05/18/16 patient presents today for a  follow-up concerning her right and left lower extremity venous stasis ulcers. Currently her wraps which are 4-layer compression have been sliding down due to the amount of swelling that she has. This is causing stasis above the wraps were really does not need to be and subsequently leading to her having blistering especially on the left above where the wrap is. In fact she has a large wound in this area that was just blistered last week and was not nearly as bad as what it is right now. Unfortunately despite what we have tried with the compression at this point I feel that we are almost being counterproductive in this respect. She tells me that her pain as a 9 out of 10 at this point in time. this pain is worse with manipulation of the wound including cleaning as well as just touching. 05/25/16 patient presents today for follow-up unfortunately her bilateral lower extremities actually significantly more edematous throughout. Last visit her wraps that actually slipped down which had been the course of things of the past several visits. For that reason we actually with light of compression to hopefully make this more uniform. Unfortunately the Kerlex and Coban compression at this point in time appears to have been suboptimal. she continues to have a significant amount of swelling in bilateral lower [name], [name] ([id number]) extremities and unfortunately had several new wounds which have surfaced of the right lower extremity at this point in time. Again venous in nature. She tells me that she does "elevate her legs" as much as she can. She notes that her legs felt tight and she relates this to be may be a 8 out of 10 at this point in time but is not having as much discomfort as last week. she is on Lasix currently 20 mg. 06/01/16; apparently over the last week or 2 the patient is developed a lot more edema in her right greater than left leg. This is resulted in substantial increase in the  wounds in the right leg to a lesser extent the left leg. For reasons that are unclear she is also had reduction in the wraps she is wearing down down to a 2 layer wrap. She has a appointment with her primary physician tomorrow, currently using 40 mg of Lasix a day. The patient has a history of lupus which apparently was systemic. She also has a history of hepatitis C. Somebody has done an ultrasound with a history of a PET hepatitis C elevated LFTs. This is labeled Hepatic Elastography,. Liver is noted to have fatty infiltration no focal abnormality. Her IVC had no abnormality visualized. She did not have ascites. Chronic medical renal disease. 06/11/2016 -- the patient was recently admitted to hospital for extensive lower extremity lymphedema and was worked up and discharged with Unna boots and these were extremely tight for her and she came in for an appointment today. She has been put on new medications as per her discharge summary. Electronic Signature(s) Signed: 06/11/2016 3:19:18 PM By: Evlyn Kanner MD, FACS Entered By:  Evlyn Kanner on 06/11/2016 15:19:18 [name], [name] ([id number]) -------------------------------------------------------------------------------- Physical Exam Details Patient Name: [name], [name] Date of Service: 06/11/2016 2:15 PM Medical Record Number: [id number] Patient Account Number: [id number] Date of Birth/Sex: 06/26/1956 (60 y.o. Female) Treating RN: Huel Coventry Primary Care Physician: Sherrie Mustache Other Clinician: Referring Physician: Sherrie Mustache Treating Physician/Extender: Rudene Re in Treatment: 24 Notes lymphedema overall looks much better and the area where her compression wrap was applied is looking like there was good control. The wounds are all about the same and the size has not changed much. No sharp debridement was required today. Electronic Signature(s) Signed: 06/11/2016 3:20:14 PM By: Evlyn Kanner MD, FACS Entered By:  Evlyn Kanner on 06/11/2016 15:20:13 [name], [name] ([id number]) -------------------------------------------------------------------------------- Physician Orders Details Patient Name: [name], [name] Date of Service: 06/11/2016 2:15 PM Medical Record Number: [id number] Patient Account Number: [id number] Date of Birth/Sex: [birth year] (60 y.o. Female) Treating RN: Curtis Sites Primary Care Physician: Sherrie Mustache Other Clinician: Referring Physician: Sherrie Mustache Treating Physician/Extender: Rudene Re in Treatment: 10 Verbal / Phone Orders: Yes Clinician: Curtis Sites Read Back and Verified: Yes Diagnosis Coding ICD-10 Coding Code Description L97.211 Non-pressure chronic ulcer of right calf limited to breakdown of skin 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 lower I87.331 extremity I87.322 Chronic venous hypertension (idiopathic) with inflammation of left lower extremity M32.10 Systemic lupus erythematosus, organ or system involvement unspecified I89.0 Lymphedema, not elsewhere classified Wound Cleansing Wound #1 Right,Medial Lower Leg o Cleanse wound with mild soap and water Wound #2 Left,Posterior Lower Leg o Cleanse wound with mild soap and water Wound #3 Right,Proximal,Medial Lower Leg o Cleanse wound with mild soap and water Wound #4 Right,Posterior Lower Leg o Cleanse wound with mild soap and water Wound #6 Right,Anterior Lower Leg o Cleanse wound with mild soap and water Primary Wound Dressing Wound #1 Right,Medial Lower Leg o Sorbalgon Ag - in clinic - silver alginate for [facility] Wound #2 Left,Posterior Lower Leg o Sorbalgon Ag - in clinic - silver alginate for [facility] Wound #3 Right,Proximal,Medial Lower Leg o Sorbalgon Ag - in clinic - silver alginate for [facility] Wound #4 Right,Posterior Lower Leg [place], Leronda [name]. ([id number]) o Sorbalgon Ag - in clinic - silver alginate for [facility] Wound #6  Right,Anterior Lower Leg o Sorbalgon Ag - in clinic - silver alginate for [facility] Secondary Dressing Wound #1 Right,Medial Lower Leg o ABD pad - xtrasorb if needed for drainage Wound #2 Left,Posterior Lower Leg o ABD pad - xtrasorb if needed for drainage Wound #3 Right,Proximal,Medial Lower Leg o ABD pad - xtrasorb if needed for drainage Wound #4 Right,Posterior Lower Leg o ABD pad - xtrasorb if needed for drainage Wound #6 Right,Anterior Lower Leg o ABD pad - xtrasorb if needed for drainage Dressing Change Frequency Wound #1 Right,Medial Lower Leg o Other: - twice weekly Wound #2 Left,Posterior Lower Leg o Other: - twice weekly Wound #3 Right,Proximal,Medial Lower Leg o Other: - twice weekly Wound #4 Right,Posterior Lower Leg o Other: - twice weekly Wound #6 Right,Anterior Lower Leg o Other: - twice weekly Follow-up Appointments Wound #1 Right,Medial Lower Leg o Return Appointment in 1 week. Wound #2 Left,Posterior Lower Leg o Return Appointment in 1 week. Wound #3 Right,Proximal,Medial Lower Leg o Return Appointment in 1 week. Wound #4 Right,Posterior Lower Leg [place], Sitara [name]. ([id number]) o Return Appointment in 1 week. Wound #6 Right,Anterior Lower Leg o Return Appointment in 1 week. Edema Control Wound #1 Right,Medial Lower Leg o 4 Layer  Compression System - Bilateral Wound #2 Left,Posterior Lower Leg o 4 Layer Compression System - Bilateral Wound #3 Right,Proximal,Medial Lower Leg o 4 Layer Compression System - Bilateral Wound #4 Right,Posterior Lower Leg o 4 Layer Compression System - Bilateral Wound #6 Right,Anterior Lower Leg o 4 Layer Compression System - Bilateral Additional Orders / Instructions Wound #1 Right,Medial Lower Leg o Increase protein intake. Wound #2 Left,Posterior Lower Leg o Increase protein intake. Wound #3 Right,Proximal,Medial Lower Leg o Increase protein intake. Wound #4 Right,Posterior  Lower Leg o Increase protein intake. Wound #6 Right,Anterior Lower Leg o Increase protein intake. Electronic Signature(s) Signed: 06/11/2016 3:41:23 PM By: Curtis Sites Signed: 06/11/2016 4:11:36 PM By: Evlyn Kanner MD, FACS Entered By: Curtis Sites on 06/11/2016 15:41:22 [place], [name] ([id number]) -------------------------------------------------------------------------------- Problem List Details Patient Name: Hansley, Leith [name]. Date of Service: 06/11/2016 2:15 PM Medical Record Number: [id number] Patient Account Number: [id number] Date of Birth/Sex: 1955-08-30 (60 y.o. Female) Treating RN: Huel Coventry Primary Care Physician: Sherrie Mustache Other Clinician: Referring Physician: Sherrie Mustache Treating Physician/Extender: Rudene Re in Treatment: 24 Active Problems ICD-10 Encounter Code Description Active Date Diagnosis L97.211 Non-pressure chronic ulcer of right calf limited to 03/02/2016 Yes breakdown of skin I87.331 Chronic venous hypertension (idiopathic) with ulcer and 12/23/2015 Yes inflammation of right lower extremity I87.322 Chronic venous hypertension (idiopathic) with 12/23/2015 Yes inflammation of left lower extremity M32.10 Systemic lupus erythematosus, organ or system 12/23/2015 Yes involvement unspecified I89.0 Lymphedema, not elsewhere classified 06/01/2016 Yes Inactive Problems Resolved Problems Electronic Signature(s) Signed: 06/11/2016 3:17:33 PM By: Evlyn Kanner MD, FACS Entered By: Evlyn Kanner on 06/11/2016 15:17:33 [name], [name] ([id number]) -------------------------------------------------------------------------------- Progress Note Details Patient Name: Garinger, Maleea [name]. Date of Service: 06/11/2016 2:15 PM Medical Record Number: [id number] Patient Account Number: [id number] Date of Birth/Sex: [birth year] (60 y.o. Female) Treating RN: Huel Coventry Primary Care Physician: Sherrie Mustache Other Clinician: Referring Physician:  Sherrie Mustache Treating Physician/Extender: Rudene Re in Treatment: 24 Subjective Chief Complaint Information obtained from Patient Patient is here for follow-up regarding her bilateral lower extremity venous stasis ulcers History of Present Illness (HPI) 12/23/15; this is a 60 year old lady who recently relocated back to [place] from [place]. She is staying with a family friend previously lived with a daughter in [place]. 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 She is on chronic prednisone she is not a diabetic. Dose of prednisone is 10 mg. The patient tells me that roughly a week ago she developed a fairly substantial blister over this area that then ruptured. She was seen in the emergency room on 5/7 an x-ray of the leg was negative she was put on Septra although I don't think any cultures were done. She also tells me she has had chronic edema in her legs for a number of years. She had a similar presentation to currently in the right lateral lower leg roughly a year ago that she was able to heal on her own. As noted she has systemic lupus but does not have lupus nephritis according to the patient. She has a lot of edema in her lower legs. The ABI's could not be obtained. 12/31/15; the patient's insurance which is [place] [employer] makes her out of network for any home health therefore we have been changing her dressing in our facility. I reviewed her trip to the ER on 12/21/2015 her creatinine was within the normal range hemoglobin and white count normal. Culture of the wound was  negative. X-ray of the leg showed soft tissue edema no foreign bodies. We have been dressing this with Aquacel Ag due to the amount of ongoing drainage 01/07/16; the patient had her arterial studies this morning I don't have these results area she comes back in to have [place] rewrap since she doesn't have access to home health 01/14/16 I still do  not have the results of her arterial studies. Our nurse correctly pointed out that we've been wrapping the left leg without an open wound I think this had to do with the fact that she had so much edema when she came in I was concerned about leaving this unwrapped. 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 We left her left leg unwrapped last week as she has no open wound here today she has extensive edema here. We did order stockings however I believe the company called but they have not called them back. She comes back in today with extensive edema in the left leg. We have her arterial studies which showed triphasic waves and all locations tested including her posterior tibial and anterior tibial arteries bilaterally. Report listed no hemodynamically significant bilateral lower extremity arterial stenosis. She has her venous studies later this month. I'm going to rewrap the left leg due to the extent of the edema, transitioning her into the stocking we ordered last week as soon as one is available Liane ComberCRAWFORD, Maciah [name]. ([id number]) 01/27/16 the patient arrived today with pooled serosanguineous drainage over the wound it would not absorb into her Aquacel Ag which is somewhat on. In spite of this the dimensions of her wound are down considerably and the wound bed looks healthy without any need for debridement. She is obtained her juxtalite stockings the left leg which has no open wound. We are waiting for the right leg wound to get smaller before ordering her one for the right leg 02/03/16; patient wound looked improved. No debridement was required. We've been using the juxtalite stocking on the left leg which is no open wound. 02/11/16 wound is remarkably better. No debridement was required. Healthy rim of epithelialization. We have been using Aquacel Ag, we'll try to close  this down with RTD over the next 2 weeks 02/18/16 wound continues to improve down a centimeter in length and width. No debridement was required. Advancing epithelialization. We started RTD last week 02/25/16: pt brought juxtalite for right lower leg today. nurse reports RTD was dry and adhered to the wound. required removal by NS. wound bed with 100% healthy red granulation tissue. denies systemic s/s of infection. 03/02/16; Considerable improvement. no debridement. 03/09/16; wound bed is very vascular no major change in dimensions. Continuing with RTD 03/16/16; unfortunately no major change in dimensions however the wound bed looks very healthy. I will continue with RTD for one more week. If his stalls consider change to collagen. 03/30/2016 -- she's been using juxta lites on her left lower extremity but her lymphedema is very significantly increased and she is not tolerating the juxta lites the edema has increased symptoms significantly 04/06/16; apparently the RTD started sticking to the wound therefore she was changed to sorbact. Her 4-layer wrap appears to have slipped one third of the way down her leg. She was tolerating the juxtalite on the left leg, I don't see the need to wrap the left leg. She is not a candidate for external compression  pumps secondary to insurance issues. 04/13/16; wound is actually measuring larger. Has been using Sorbact 04/20/16; although the wound dimensions of really not changed all that much, the better this certainly looks healthy and there is advancing epithelialization from both sides. No evidence of infection 04/27/16 changed to [facility] last week and the dimensions appear to be a lot better. No evidence of infection. She does not put the juxtalite stocking on the left leg on correctly nevertheless she has never had an open wound here. I am really uncertain whether we ever got 2 to juxtalite stockings for her. The wound started as a blister 05/04/16; finally  with Hydrofera Blue last week the area on the right leg has closed over. However in inspection of her left leg she now has a small wound on the posterior aspect of the left leg. She has been using juxta lites on this area but probably not really applying them correctly and with enough tension. She is not a candidate for compression pumps secondary to insurance issues 05/11/16; the area on her right anterior leg and closed over last week however we noted an area on her left posterior calf. I elected to wrap both her legs in anticipation we be able to transition her to juxtalite stockings. She arrives today the wraps and fallen down on both sides. The area on the right anterior leg has reopened she also had a very tense blister on the right anterior lateral leg just above her wound which I elected to excise. The area on the posterior left leg is roughly about the same 05/18/16 patient presents today for a follow-up concerning her right and left lower extremity venous stasis ulcers. Currently her wraps which are 4-layer compression have been sliding down due to the amount of swelling that she has. This is causing stasis above the wraps were really does not need to be and subsequently leading to her having blistering especially on the left above where the wrap is. In fact she has a large wound in this area that was just blistered last week and was not nearly as bad as what it is right now. Unfortunately despite what we have tried with the compression at this point I feel that we are almost being counterproductive in this respect. She tells me that her pain as a 9 out of 10 at this point in time. this pain is worse with manipulation of the wound including cleaning as well as just touching. MICHELENE, KENISTON ([id number]) 05/25/16 patient presents today for follow-up unfortunately her bilateral lower extremities actually significantly more edematous throughout. Last visit her wraps that actually slipped  down which had been the course of things of the past several visits. For that reason we actually with light of compression to hopefully make this more uniform. Unfortunately the Kerlex and Coban compression at this point in time appears to have been suboptimal. she continues to have a significant amount of swelling in bilateral lower extremities and unfortunately had several new wounds which have surfaced of the right lower extremity at this point in time. Again venous in nature. She tells me that she does "elevate her legs" as much as she can. She notes that her legs felt tight and she relates this to be may be a 8 out of 10 at this point in time but is not having as much discomfort as last week. she is on Lasix currently 20 mg. 06/01/16; apparently over the last week or 2 the patient is developed a  lot more edema in her right greater than left leg. This is resulted in substantial increase in the wounds in the right leg to a lesser extent the left leg. For reasons that are unclear she is also had reduction in the wraps she is wearing down down to a 2 layer wrap. She has a appointment with her primary physician tomorrow, currently using 40 mg of Lasix a day. The patient has a history of lupus which apparently was systemic. She also has a history of hepatitis C. Somebody has done an ultrasound with a history of a PET hepatitis C elevated LFTs. This is labeled Hepatic Elastography,. Liver is noted to have fatty infiltration no focal abnormality. Her IVC had no abnormality visualized. She did not have ascites. Chronic medical renal disease. 06/11/2016 -- the patient was recently admitted to hospital for extensive lower extremity lymphedema and was worked up and discharged with Unna boots and these were extremely tight for her and she came in for an appointment today. She has been put on new medications as per her discharge summary. Objective Constitutional Vitals Time Taken: 2:23 PM, Height: 62 in,  Weight: 170 lbs, BMI: 31.1, Temperature: 98.5 F, Pulse: 93 bpm, Respiratory Rate: 20 breaths/min, Blood Pressure: 122/59 mmHg. Integumentary (Hair, Skin) Wound #1 status is Open. Original cause of wound was Blister. The wound is located on the Right,Medial Lower Leg. The wound measures 2.5cm length x 1.2cm width x 0.1cm depth; 2.356cm^2 area and 0.236cm^3 volume. The wound is limited to skin breakdown. There is no tunneling or undermining noted. There is a large amount of serosanguineous drainage noted. The wound margin is flat and intact. There is no granulation within the wound bed. There is a large (67-100%) amount of necrotic tissue within the wound bed including Eschar. The periwound skin appearance did not exhibit: Callus, Crepitus, Excoriation, Fluctuance, Friable, Induration, Localized Edema, Rash, Scarring, Dry/Scaly, Maceration, Moist, Atrophie Blanche, Cyanosis, Ecchymosis, Hemosiderin Staining, Mottled, Pallor, Rubor, Erythema. The periwound has tenderness on palpation. Wound #2 status is Open. Original cause of wound was Gradually Appeared. The wound is located on the Left,Posterior Lower Leg. The wound measures 1.2cm length x 0.6cm width x 0.1cm depth; 0.565cm^2 area and 0.057cm^3 volume. The wound is limited to skin breakdown. There is no tunneling or undermining noted. There is a large amount of serosanguineous drainage noted. The wound margin is flat and intact. Bellotti, Emera [name]. ([id number]) There is no granulation within the wound bed. There is a large (67-100%) amount of necrotic tissue within the wound bed including Eschar. The periwound skin appearance exhibited: Erythema. The periwound skin appearance did not exhibit: Callus, Crepitus, Excoriation, Fluctuance, Friable, Induration, Localized Edema, Rash, Scarring, Dry/Scaly, Maceration, Moist, Atrophie Blanche, Cyanosis, Ecchymosis, Hemosiderin Staining, Mottled, Pallor, Rubor. The surrounding wound skin color is noted  with erythema which is circumferential. The periwound has tenderness on palpation. Wound #3 status is Open. Original cause of wound was Blister. The wound is located on the Right,Proximal,Medial Lower Leg. The wound measures 1.2cm length x 2cm width x 0.1cm depth; 1.885cm^2 area and 0.188cm^3 volume. The wound is limited to skin breakdown. There is no tunneling or undermining noted. There is a large amount of serosanguineous drainage noted. The wound margin is flat and intact. There is no granulation within the wound bed. There is a large (67-100%) amount of necrotic tissue within the wound bed including Eschar. The periwound skin appearance exhibited: Erythema. The periwound skin appearance did not exhibit: Callus, Crepitus, Excoriation, Fluctuance, Friable, Induration, Localized Edema,  Rash, Scarring, Dry/Scaly, Maceration, Moist, Atrophie Blanche, Cyanosis, Ecchymosis, Hemosiderin Staining, Mottled, Pallor, Rubor. The surrounding wound skin color is noted with erythema which is circumferential. The periwound has tenderness on palpation. Wound #4 status is Open. Original cause of wound was Gradually Appeared. The wound is located on the Right,Posterior Lower Leg. The wound measures 0.5cm length x 0.4cm width x 0.1cm depth; 0.157cm^2 area and 0.016cm^3 volume. Wound #5 status is Healed - Epithelialized. Original cause of wound was Blister. The wound is located on the Left,Proximal,Medial Lower Leg. The wound measures 0cm length x 0cm width x 0cm depth; 0cm^2 area and 0cm^3 volume. Wound #6 status is Open. Original cause of wound was Gradually Appeared. The wound is located on the Right,Anterior Lower Leg. The wound measures 1.5cm length x 0.9cm width x 0.1cm depth; 1.06cm^2 area and 0.106cm^3 volume. There is no tunneling or undermining noted. There is a large amount of serosanguineous drainage noted. The wound margin is flat and intact. There is no granulation within the wound bed. There is a  large (67-100%) amount of necrotic tissue within the wound bed including Eschar. The periwound skin appearance exhibited: Erythema. The periwound skin appearance did not exhibit: Callus, Crepitus, Excoriation, Fluctuance, Friable, Induration, Localized Edema, Rash, Scarring, Dry/Scaly, Maceration, Moist, Atrophie Blanche, Cyanosis, Ecchymosis, Hemosiderin Staining, Mottled, Pallor, Rubor. The surrounding wound skin color is noted with erythema which is circumferential. The periwound has tenderness on palpation. 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 ([id number]) I have recommended we continue with a 4-layer Profore wrap and silver alginate and Drawtex to be changed twice a week. She still is not eligible for home health and has not been able to get her insurance eligibility confirmed. She will be back to see Dr. Leanord Hawking on Wednesday. Procedures Wound #1 Wound #1 is a Venous Leg Ulcer located on the Right,Medial Lower Leg . There was a Four Layer Compression Therapy Procedure by Curtis Sites, RN. Post procedure Diagnosis Wound #1: Same as Pre-Procedure Wound #2 Wound #2 is a Lymphedema located on the Left,Posterior Lower Leg . There was a Four Layer Compression Therapy Procedure with a pre-treatment ABI of 1.4 by Curtis Sites, RN. Post procedure Diagnosis Wound #2: Same as Pre-Procedure Wound #3 Wound #3 is a Venous Leg Ulcer located on the Right,Proximal,Medial Lower Leg . There was a Four Layer Compression Therapy Procedure by Curtis Sites, RN. Post procedure Diagnosis  Wound #3: Same as Pre-Procedure Wound #4 Wound #4 is a Venous Leg Ulcer located on the Right,Posterior Lower Leg . There was a Four Layer Compression Therapy Procedure by Curtis Sites, RN. Post procedure Diagnosis Wound #4: Same as Pre-Procedure Wound #6 Wound #6 is a Venous Leg Ulcer located on the Right,Anterior Lower Leg . There was a Four Layer Compression Therapy Procedure by Curtis Sites, RN. Post procedure Diagnosis Wound #6: Same as Pre-Procedure Doe, Livy [name]. ([id number]) Plan Wound Cleansing: Wound #1 Right,Medial Lower Leg: Cleanse wound with mild soap and water Wound #2 Left,Posterior Lower Leg: Cleanse wound with mild soap and water Wound #3 Right,Proximal,Medial Lower Leg: Cleanse wound with mild soap and water Wound #4 Right,Posterior Lower Leg: Cleanse wound with mild soap and water Wound #6  Right,Anterior Lower Leg: Cleanse wound with mild soap and water Primary Wound Dressing: Wound #1 Right,Medial Lower Leg: Sorbalgon Ag - in clinic - silver alginate for HHRN Wound #2 Left,Posterior Lower Leg: Sorbalgon Ag - in clinic - silver alginate for HHRN Wound #3 Right,Proximal,Medial Lower Leg: Sorbalgon Ag - in clinic - silver alginate for HHRN Wound #4 Right,Posterior Lower Leg: Sorbalgon Ag - in clinic - silver alginate for HHRN Wound #6 Right,Anterior Lower Leg: Sorbalgon Ag - in clinic - silver alginate for [facility] Secondary Dressing: Wound #1 Right,Medial Lower Leg: ABD pad - xtrasorb if needed for drainage Wound #2 Left,Posterior Lower Leg: ABD pad - xtrasorb if needed for drainage Wound #3 Right,Proximal,Medial Lower Leg: ABD pad - xtrasorb if needed for drainage Wound #4 Right,Posterior Lower Leg: ABD pad - xtrasorb if needed for drainage Wound #6 Right,Anterior Lower Leg: ABD pad - xtrasorb if needed for drainage Dressing Change Frequency: Wound #1 Right,Medial Lower Leg: Other: - twice weekly Wound #2 Left,Posterior Lower Leg: Other: - twice  weekly Wound #3 Right,Proximal,Medial Lower Leg: Other: - twice weekly Wound #4 Right,Posterior Lower Leg: Other: - twice weekly Wound #6 Right,Anterior Lower Leg: Other: - twice weekly Follow-up Appointments: Wound #1 Right,Medial Lower Leg: Return Appointment in 1 week. ELIAS, BORDNER ([id number]) Wound #2 Left,Posterior Lower Leg: Return Appointment in 1 week. Wound #3 Right,Proximal,Medial Lower Leg: Return Appointment in 1 week. Wound #4 Right,Posterior Lower Leg: Return Appointment in 1 week. Wound #6 Right,Anterior Lower Leg: Return Appointment in 1 week. Edema Control: Wound #1 Right,Medial Lower Leg: 4 Layer Compression System - Bilateral Wound #2 Left,Posterior Lower Leg: 4 Layer Compression System - Bilateral Wound #3 Right,Proximal,Medial Lower Leg: 4 Layer Compression System - Bilateral Wound #4 Right,Posterior Lower Leg: 4 Layer Compression System - Bilateral Wound #6 Right,Anterior Lower Leg: 4 Layer Compression System - Bilateral Additional Orders / Instructions: Wound #1 Right,Medial Lower Leg: Increase protein intake. Wound #2 Left,Posterior Lower Leg: Increase protein intake. Wound #3 Right,Proximal,Medial Lower Leg: Increase protein intake. Wound #4 Right,Posterior Lower Leg: Increase protein intake. Wound #6 Right,Anterior Lower Leg: Increase protein intake. I have recommended we continue with a 4-layer Profore wrap and silver alginate and Drawtex to be changed twice a week. She still is not eligible for home health and has not been able to get her insurance eligibility confirmed. She will be back to see Dr. Leanord Hawking on Wednesday. Electronic Signature(s) Signed: 06/14/2016 4:00:28 PM By: Evlyn Kanner MD, FACS Previous Signature: 06/11/2016 4:18:58 PM Version By: Evlyn Kanner MD, FACS Previous Signature: 06/11/2016 3:21:50 PM Version By: Evlyn Kanner MD, FACS Entered By: Evlyn Kanner on 06/14/2016 16:00:28 OFILIA, RAYON  ([id number]) Bettcher, Shalona Elbert Ewings ([id number]) -------------------------------------------------------------------------------- SuperBill Details Patient Name: Baker, Adelynne [name]. Date of Service: 06/11/2016 Medical Record Number: [id number] Patient Account Number: [id number] Date of Birth/Sex: 1955/11/29 (60 y.o. Female) Treating RN: Huel Coventry Primary Care Physician: Sherrie Mustache Other Clinician: Referring Physician: Sherrie Mustache Treating Physician/Extender: Rudene Re in Treatment: 24 Diagnosis Coding ICD-10 Codes Code Description [phone] Non-pressure chronic ulcer of right calf limited to breakdown of skin 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 lower I87.331 extremity I87.322 Chronic venous hypertension (idiopathic) with inflammation of left lower extremity M32.10 Systemic lupus erythematosus, organ or system involvement unspecified I89.0 Lymphedema, not elsewhere classified Facility Procedures CPT4: Description Modifier Quantity Code [id number] BILATERAL: Application of multi-layer venous compression 1 system; leg (below knee), including ankle and foot. Physician Procedures CPT4: Description Modifier Quantity Code [id number] [phone] -  WC PHYS LEVEL 3 - EST PT 1 ICD-10 Description Diagnosis L97.211 Non-pressure chronic ulcer of right calf limited to breakdown of skin I87.331 Chronic venous hypertension (idiopathic) with ulcer and  inflammation of right lower extremity I87.322 Chronic venous hypertension (idiopathic) with inflammation of left lower extremity M32.10 Systemic lupus erythematosus, organ or system involvement unspecified Electronic Signature(s) Signed: 06/11/2016 4:19:07 PM By: Evlyn Kanner MD, FACS Previous Signature: 06/11/2016 4:15:11 PM Version By: Curtis Sites Previous Signature: 06/11/2016 3:22:08 PM Version By: Evlyn Kanner MD, FACS Entered By: Evlyn Kanner on 06/11/2016 16:19:07

## 2016-06-12 NOTE — Progress Notes (Signed)
[name] ([id number]) Visit Report for 06/11/2016 Arrival Information Details Patient Name: [name], [name] Date of Service: 06/11/2016 2:15 PM Medical Record Number: [id number] Patient Account Number: [id number] Date of Birth/Sex: Oct 03, 1955 (60 y.o. Female) Treating RN: Curtis Sites Primary Care Physician: Sherrie Mustache Other Clinician: Referring Physician: Sherrie Mustache Treating Physician/Extender: Rudene Re in Treatment: 24 Visit Information History Since Last Visit Added or deleted any medications: No Patient Arrived: Walker Any new allergies or adverse reactions: No Arrival Time: 14:18 Had a fall or experienced change in No Accompanied By: self activities of daily living that may affect Transfer Assistance: None risk of falls: Patient Identification Verified: Yes Signs or symptoms of abuse/neglect since last No Secondary Verification Process Completed: Yes visito Patient Requires Transmission-Based No Hospitalized since last visit: No Precautions: Pain Present Now: Yes Patient Has Alerts: No Electronic Signature(s) Signed: 06/11/2016 5:04:12 PM By: Curtis Sites Entered By: Curtis Sites on 06/11/2016 14:23:14 Gusman, Naeemah Elbert Ewings ([id number]) -------------------------------------------------------------------------------- Compression Therapy Details Patient Name: [name], [name] Date of Service: 06/11/2016 2:15 PM Medical Record Number: [id number] Patient Account Number: [id number] Date of Birth/Sex: 1956/05/21 (60 y.o. Female) Treating RN: Curtis Sites Primary Care Physician: Sherrie Mustache Other Clinician: Referring Physician: Sherrie Mustache Treating Physician/Extender: Rudene Re in Treatment: 24 Compression Therapy Performed for Wound Wound #1 Right,Medial Lower Leg Assessment: Performed By: Clinician Curtis Sites, RN Compression Type: Four Layer Post Procedure Diagnosis Same as Pre-procedure Electronic  Signature(s) Signed: 06/11/2016 4:32:46 PM By: Curtis Sites Entered By: Curtis Sites on 06/11/2016 16:32:46 Nagy, [name] ([id number]) -------------------------------------------------------------------------------- Compression Therapy Details Patient Name: [name], [name] Date of Service: 06/11/2016 2:15 PM Medical Record Number: [id number] Patient Account Number: [id number] Date of Birth/Sex: September 11, 1955 (60 y.o. Female) Treating RN: Curtis Sites Primary Care Physician: Sherrie Mustache Other Clinician: Referring Physician: Sherrie Mustache Treating Physician/Extender: Rudene Re in Treatment: 24 Compression Therapy Performed for Wound Wound #3 Right,Proximal,Medial Lower Leg Assessment: Performed By: Clinician Curtis Sites, RN Compression Type: Four Layer Post Procedure Diagnosis Same as Pre-procedure Electronic Signature(s) Signed: 06/11/2016 4:32:46 PM By: Curtis Sites Entered By: Curtis Sites on 06/11/2016 16:32:46 Maino, [name] ([id number]) -------------------------------------------------------------------------------- Compression Therapy Details Patient Name: [name], [name] Date of Service: 06/11/2016 2:15 PM Medical Record Number: [id number] Patient Account Number: [id number] Date of Birth/Sex: [birth year] (60 y.o. Female) Treating RN: Curtis Sites Primary Care Physician: Sherrie Mustache Other Clinician: Referring Physician: Sherrie Mustache Treating Physician/Extender: Rudene Re in Treatment: 24 Compression Therapy Performed for Wound Wound #4 Right,Posterior Lower Leg Assessment: Performed By: Clinician Curtis Sites, RN Compression Type: Four Layer Post Procedure Diagnosis Same as Pre-procedure Electronic Signature(s) Signed: 06/11/2016 4:32:46 PM By: Curtis Sites Entered By: Curtis Sites on 06/11/2016 16:32:46 Paci, [name]  ([id number]) -------------------------------------------------------------------------------- Compression Therapy Details Patient Name: [name], [name] Date of Service: 06/11/2016 2:15 PM Medical Record Number: [id number] Patient Account Number: [id number] Date of Birth/Sex: [birth year] (60 y.o. Female) Treating RN: Curtis Sites Primary Care Physician: Sherrie Mustache Other Clinician: Referring Physician: Sherrie Mustache Treating Physician/Extender: Rudene Re in Treatment: 24 Compression Therapy Performed for Wound Wound #6 Right,Anterior Lower Leg Assessment: Performed By: Clinician Curtis Sites, RN Compression Type: Four Layer Post Procedure Diagnosis Same as Pre-procedure Electronic Signature(s) Signed: 06/11/2016 4:32:46 PM By: Curtis Sites Entered By: Curtis Sites on 06/11/2016 16:32:46 Ziomek, [name] ([id number]) -------------------------------------------------------------------------------- Compression Therapy Details Patient Name: [name], [name] Date of Service: 06/11/2016 2:15 PM Medical Record Number: [id number] Patient Account Number: [id number] Date of Birth/Sex: Jan 14, 1956 (60 y.o. Female) Treating RN: Dorthy,  Mardene Celeste Primary Care Physician: Sherrie Mustache Other Clinician: Referring Physician: Sherrie Mustache Treating Physician/Extender: Rudene Re in Treatment: 24 Compression Therapy Performed for Wound Wound #2 Left,Posterior Lower Leg Assessment: Performed By: Clinician Curtis Sites, RN Compression Type: Four Layer Pre Treatment ABI: 1.4 Post Procedure Diagnosis Same as Pre-procedure Electronic Signature(s) Signed: 06/11/2016 4:33:16 PM By: Curtis Sites Entered By: Curtis Sites on 06/11/2016 16:33:16 Willers, [name] ([id number]) -------------------------------------------------------------------------------- Encounter Discharge Information Details Patient Name: [name], [name] Date of Service: 06/11/2016 2:15  PM Medical Record Number: [id number] Patient Account Number: [id number] Date of Birth/Sex: [birth year] (60 y.o. Female) Treating RN: Huel Coventry Primary Care Physician: Sherrie Mustache Other Clinician: Referring Physician: Sherrie Mustache Treating Physician/Extender: Rudene Re in Treatment: 24 Encounter Discharge Information Items Discharge Pain Level: 0 Discharge Condition: Stable Ambulatory Status: Walker Discharge Destination: Home Transportation: Private Auto Accompanied By: self Schedule Follow-up Appointment: Yes Medication Reconciliation completed and provided to Patient/Care No Lieutenant Abarca: Provided on Clinical Summary of Care: 06/11/2016 Form Type Recipient Paper Patient GC Electronic Signature(s) Signed: 06/11/2016 4:17:02 PM By: Curtis Sites Previous Signature: 06/11/2016 3:41:56 PM Version By: Gwenlyn Perking Entered By: Curtis Sites on 06/11/2016 16:17:02 Agrawal, [name] ([id number]) -------------------------------------------------------------------------------- General Visit Notes Details Patient Name: Bleicher, Shanti L. Date of Service: 06/11/2016 2:15 PM Medical Record Number: [id number] Patient Account Number: [id number] Date of Birth/Sex: [birth year] (60 y.o. Female) Treating RN: Curtis Sites Primary Care Physician: Sherrie Mustache Other Clinician: Referring Physician: Sherrie Mustache Treating Physician/Extender: Rudene Re in Treatment: 24 Notes Patient has had medication changes but does not know what medications she is taking Electronic Signature(s) Signed: 06/11/2016 5:04:12 PM By: Curtis Sites Entered By: Curtis Sites on 06/11/2016 14:25:18 Hinchliffe, [name] ([id number]) -------------------------------------------------------------------------------- Lower Extremity Assessment Details Patient Name: Chason, Pranavi L. Date of Service: 06/11/2016 2:15 PM Medical Record Number: [id number] Patient Account Number: [id number] Date  of Birth/Sex: 11-May-1956 (60 y.o. Female) Treating RN: Curtis Sites Primary Care Physician: Sherrie Mustache Other Clinician: Referring Physician: Sherrie Mustache Treating Physician/Extender: Rudene Re in Treatment: 24 Edema Assessment Assessed: [Left: No] [Right: No] Edema: [Left: Yes] [Right: Yes] Calf Left: Right: Point of Measurement: 31 cm From Medial Instep 40.5 cm 48.2 cm Ankle Left: Right: Point of Measurement: 11 cm From Medial Instep 27.5 cm 27.1 cm Vascular Assessment Pulses: Posterior Tibial Dorsalis Pedis Palpable: [Left:Yes] [Right:Yes] Extremity colors, hair growth, and conditions: Extremity Color: [Left:Normal] [Right:Normal] Hair Growth on Extremity: [Left:No] [Right:No] Temperature of Extremity: [Left:Warm] [Right:Warm] Capillary Refill: [Left:< 3 seconds] [Right:< 3 seconds] Electronic Signature(s) Signed: 06/11/2016 5:04:12 PM By: Curtis Sites Entered By: Curtis Sites on 06/11/2016 14:54:12 Sylva, Anjeanette LMarland Kitchen ([id number]) -------------------------------------------------------------------------------- Multi Wound Chart Details Patient Name: Trouten, Vivianna L. Date of Service: 06/11/2016 2:15 PM Medical Record Number: [id number] Patient Account Number: [id number] Date of Birth/Sex: [birth year] (60 y.o. Female) Treating RN: Curtis Sites Primary Care Physician: Sherrie Mustache Other Clinician: Referring Physician: Sherrie Mustache Treating Physician/Extender: Rudene Re in Treatment: 24 Vital Signs Height(in): 62 Pulse(bpm): 93 Weight(lbs): 170 Blood Pressure 122/59 (mmHg): Body Mass Index(BMI): 31 Temperature(F): 98.5 Respiratory Rate 20 (breaths/min): Photos: Wound Location: Right Lower Leg - Medial Left Lower Leg - Posterior Right Lower Leg - Medial, Proximal Wounding Event: Blister Gradually Appeared Blister Primary Etiology: Venous Leg Ulcer Lymphedema Venous Leg Ulcer Date Acquired: 12/15/2015 04/19/2016 05/18/2016 Weeks  of Treatment: 24 5 3  Wound Status: Open Open Open Clustered Wound: No Yes No Measurements L x W x D 2.5x1.2x0.1 1.2x0.6x0.1 1.2x2x0.1 (cm) Area (cm) : 2.356 0.565 1.885 Volume (cm) :  0.236 0.057 0.188 % Reduction in Area: 90.90% 33.40% 90.90% % Reduction in Volume: 90.90% 32.90% 95.50% Classification: Partial Thickness Partial Thickness Full Thickness Without Exposed Support Structures Exudate Amount: Large Large Large Exudate Type: Serosanguineous Serosanguineous Serosanguineous Exudate Color: red, brown red, brown red, brown Wound Margin: Flat and Intact Flat and Intact Flat and Intact Granulation Amount: None Present (0%) None Present (0%) None Present (0%) Necrotic Amount: Large (67-100%) Large (67-100%) Large (67-100%) Schmuck, Sheila L. ([id number]) Necrotic Tissue: Eschar Eschar Eschar Exposed Structures: Fascia: No Fascia: No Fascia: No Fat: No Fat: No Fat: No Tendon: No Tendon: No Tendon: No Muscle: No Muscle: No Muscle: No Joint: No Joint: No Joint: No Bone: No Bone: No Bone: No Limited to Skin Limited to Skin Limited to Skin Breakdown Breakdown Breakdown Epithelialization: None None None Periwound Skin Texture: Edema: No Edema: No Edema: No Excoriation: No Excoriation: No Excoriation: No Induration: No Induration: No Induration: No Callus: No Callus: No Callus: No Crepitus: No Crepitus: No Crepitus: No Fluctuance: No Fluctuance: No Fluctuance: No Friable: No Friable: No Friable: No Rash: No Rash: No Rash: No Scarring: No Scarring: No Scarring: No Periwound Skin Maceration: No Maceration: No Maceration: No Moisture: Moist: No Moist: No Moist: No Dry/Scaly: No Dry/Scaly: No Dry/Scaly: No Periwound Skin Color: Atrophie Blanche: No Erythema: Yes Erythema: Yes Cyanosis: No Atrophie Blanche: No Atrophie Blanche: No Ecchymosis: No Cyanosis: No Cyanosis: No Erythema: No Ecchymosis: No Ecchymosis: No Hemosiderin Staining:  No Hemosiderin Staining: No Hemosiderin Staining: No Mottled: No Mottled: No Mottled: No Pallor: No Pallor: No Pallor: No Rubor: No Rubor: No Rubor: No Erythema Location: N/A Circumferential Circumferential Tenderness on Yes Yes Yes Palpation: Wound Preparation: Ulcer Cleansing: Other: Ulcer Cleansing: Other: Ulcer Cleansing: Other: soap and water soap and water soap and water Topical Anesthetic Topical Anesthetic Topical Anesthetic Applied: Other: lidocaine Applied: Other: lidocaine Applied: Other: lidocaine 4% 4% 4% Wound Number: [id number] 6  Photos: No Photos Wound Location: Right, Posterior Lower Left Lower Leg - Medial, Right Lower Leg - Anterior Leg Proximal Naumann, Dorothey L. ([id number]) Wounding Event: Gradually Appeared Blister Gradually Appeared Primary Etiology: Venous Leg Ulcer Venous Leg Ulcer Venous Leg Ulcer Date Acquired: 05/25/2016 05/25/2016 06/11/2016 Weeks of Treatment: 2 2 0 Wound Status: Open Healed - Epithelialized Open Clustered Wound: No No No Measurements L x W x D 0.5x0.4x0.1 0x0x0 1.5x0.9x0.1 (cm) Area (cm) : 0.157 0 1.06 Volume (cm) : 0.016 0 0.106 % Reduction in Area: 88.90% 100.00% N/A % Reduction in Volume: 88.70% 100.00% N/A Classification: Partial Thickness Partial Thickness Partial Thickness Exudate Amount: N/A N/A Large Exudate Type: N/A N/A Serosanguineous Exudate Color: N/A N/A red, brown Wound Margin: N/A N/A Flat and Intact Granulation Amount: N/A N/A None Present (0%) Necrotic Amount: N/A N/A Large (67-100%) Necrotic Tissue: N/A N/A Eschar Exposed Structures: N/A N/A N/A Epithelialization: N/A N/A None Periwound Skin Texture: No Abnormalities Noted No Abnormalities Noted Edema: No Excoriation: No Induration: No Callus: No Crepitus: No Fluctuance: No Friable: No Rash: No Scarring: No Periwound Skin No Abnormalities Noted No Abnormalities Noted Maceration: No Moisture: Moist: No Dry/Scaly: No Periwound Skin Color: No  Abnormalities Noted No Abnormalities Noted Erythema: Yes Atrophie Blanche: No Cyanosis: No Ecchymosis: No Hemosiderin Staining: No Mottled: No Pallor: No Rubor: No Erythema Location: N/A N/A Circumferential Tenderness on No No Yes Palpation: Wound Preparation: N/A N/A Ulcer Cleansing: Other: soap and water Topical Anesthetic Taulbee, Tiaira L. ([id number]) Applied: Other: lidocaine 4% Treatment Notes Electronic Signature(s) Signed: 06/11/2016 3:39:40 PM By: Curtis Sitesorthy, Joanna Entered  By: Curtis Sites on 06/11/2016 15:39:40 Embree, [name] ([id number]) -------------------------------------------------------------------------------- Multi-Disciplinary Care Plan Details Patient Name: Yabut, Reanne L. Date of Service: 06/11/2016 2:15 PM Medical Record Number: [id number] Patient Account Number: [id number] Date of Birth/Sex: [birth year] (60 y.o. Female) Treating RN: Curtis Sites Primary Care Physician: Sherrie Mustache Other Clinician: Referring Physician: Sherrie Mustache Treating Physician/Extender: Rudene Re in Treatment: 98 Active Inactive Abuse / Safety / Falls / Self Care Management Nursing Diagnoses: Impaired physical mobility Potential for falls Goals: Patient will remain injury free Date Initiated: 12/23/2015 Goal Status: Active Interventions: Assess fall risk on admission and as needed Notes: Nutrition Nursing Diagnoses: Imbalanced nutrition Goals: Patient/caregiver verbalizes understanding of need to maintain therapeutic glucose control per primary care physician Date Initiated: 12/23/2015 Goal Status: Active Interventions: Provide education on nutrition Treatment Activities: Education provided on Nutrition : 04/20/2016 Notes: Orientation to the Wound Care Program Nursing Diagnoses: LOURDEZ, CONFAIR ([id number]) Knowledge deficit related to the wound healing center program Goals: Patient/caregiver will verbalize understanding of the Wound  Healing Center Program Date Initiated: 12/23/2015 Goal Status: Active Interventions: Provide education on orientation to the wound center Notes: Venous Leg Ulcer Nursing Diagnoses: Potential for venous Insuffiency (use before diagnosis confirmed) Goals: Patient will maintain optimal edema control Date Initiated: 12/23/2015 Goal Status: Active Interventions: Provide education on venous insufficiency Notes: Wound/Skin Impairment Nursing Diagnoses: Impaired tissue integrity Goals: Ulcer/skin breakdown will heal within 14 weeks Date Initiated: 12/23/2015 Goal Status: Active Interventions: Assess ulceration(s) every visit Notes: Electronic Signature(s) Signed: 06/11/2016 3:39:33 PM By: Curtis Sites Entered By: Curtis Sites on 06/11/2016 15:39:33 Warr, Timaya Elbert Ewings ([id number]) -------------------------------------------------------------------------------- Pain Assessment Details Patient Name: Stefanski, Donald L. Date of Service: 06/11/2016 2:15 PM Medical Record Number: [id number] Patient Account Number: [id number] Date of Birth/Sex: [birth year] (60 y.o. Female) Treating RN: Curtis Sites Primary Care Physician: Sherrie Mustache Other Clinician: Referring Physician: Sherrie Mustache Treating Physician/Extender: Rudene Re in Treatment: 24 Active Problems Location of Pain Severity and Description of Pain Patient Has Paino Yes Site Locations Pain Location: Pain in Ulcers With Dressing Change: Yes Duration of the Pain. Constant / Intermittento Intermittent Pain Management and Medication Current Pain Management: Notes Topical or injectable lidocaine is offered to patient for acute pain when surgical debridement is performed. If needed, Patient is instructed to use over the counter pain medication for the following 24-48 hours after debridement. Wound care MDs do not prescribed pain medications. Patient has chronic pain or uncontrolled pain. Patient has been instructed  to make an appointment with their Primary Care Physician for pain management. Electronic Signature(s) Signed: 06/11/2016 5:04:12 PM By: Curtis Sites Entered By: Curtis Sites on 06/11/2016 14:23:28 Macrae, [name] ([id number]) -------------------------------------------------------------------------------- Patient/Caregiver Education Details Patient Name: Shehadeh, [name]. Date of Service: 06/11/2016 2:15 PM Medical Record Number: [id number] Patient Account Number: [id number] Date of Birth/Gender: 12-12-1955 (60 y.o. Female) Treating RN: Curtis Sites Primary Care Physician: Sherrie Mustache Other Clinician: Referring Physician: Sherrie Mustache Treating Physician/Extender: Rudene Re in Treatment: 24 Education Assessment Education Provided To: Patient Education Topics Provided Wound/Skin Impairment: Handouts: Other: rewrap if needed Methods: Demonstration, Explain/Verbal Responses: State content correctly Electronic Signature(s) Signed: 06/11/2016 5:04:12 PM By: Curtis Sites Entered By: Curtis Sites on 06/11/2016 16:18:44 Hern, Ruthann Elbert Ewings ([id number]) -------------------------------------------------------------------------------- Wound Assessment Details Patient Name: Stantz, Jemila L. Date of Service: 06/11/2016 2:15 PM Medical Record Number: [id number] Patient Account Number: [id number] Date of Birth/Sex: 07/14/1956 (60 y.o. Female) Treating RN: Curtis Sites Primary Care Physician: Sherrie Mustache Other Clinician: Referring Physician: Sherrie Mustache Treating Physician/Extender: Evlyn Kanner  Weeks in Treatment: 24 Wound Status Wound Number: 1 Primary Etiology: Venous Leg Ulcer Wound Location: Right Lower Leg - Medial Wound Status: Open Wounding Event: Blister Date Acquired: 12/15/2015 Weeks Of Treatment: 24 Clustered Wound: No Photos Wound Measurements Length: (cm) 2.5 Width: (cm) 1.2 Depth: (cm) 0.1 Area: (cm) 2.356 Volume: (cm)  0.236 % Reduction in Area: 90.9% % Reduction in Volume: 90.9% Epithelialization: None Tunneling: No Undermining: No Wound Description Classification: Partial Thickness Wound Margin: Flat and Intact Exudate Amount: Large Exudate Type: Serosanguineous Exudate Color: red, brown Foul Odor After Cleansing: No Wound Bed Granulation Amount: None Present (0%) Exposed Structure Necrotic Amount: Large (67-100%) Fascia Exposed: No Necrotic Quality: Eschar Fat Layer Exposed: No Tendon Exposed: No Muscle Exposed: No Napoli, Samaya L. ([id number]) Joint Exposed: No Bone Exposed: No Limited to Skin Breakdown Periwound Skin Texture Texture Color No Abnormalities Noted: No No Abnormalities Noted: No Callus: No Atrophie Blanche: No Crepitus: No Cyanosis: No Excoriation: No Ecchymosis: No Fluctuance: No Erythema: No Friable: No Hemosiderin Staining: No Induration: No Mottled: No Localized Edema: No Pallor: No Rash: No Rubor: No Scarring: No Temperature / Pain Moisture Tenderness on Palpation: Yes No Abnormalities Noted: No Dry / Scaly: No Maceration: No Moist: No Wound Preparation Ulcer Cleansing: Other: soap and water, Topical Anesthetic Applied: Other: lidocaine 4%, Treatment Notes Wound #1 (Right, Medial Lower Leg) 1. Cleansed with: Cleanse wound with antibacterial soap and water 4. Dressing Applied: Other dressing (specify in notes) 5. Secondary Dressing Applied ABD Pad 7. Secured with 4 Layer Compression System - Bilateral Notes [place] Electronic Signature(s) Signed: 06/11/2016 5:04:12 PM By: Curtis Sites Entered By: Curtis Sites on 06/11/2016 15:03:21 Lemmons, Shabnam Elbert Ewings ([id number]) -------------------------------------------------------------------------------- Wound Assessment Details Patient Name: Raffo, Shaquilla L. Date of Service: 06/11/2016 2:15 PM Medical Record Number: [id number] Patient Account Number: [id number] Date of Birth/Sex:  [birth year] (60 y.o. Female) Treating RN: Curtis Sites Primary Care Physician: Sherrie Mustache Other Clinician: Referring Physician: Sherrie Mustache Treating Physician/Extender: Rudene Re in Treatment: 24 Wound Status Wound Number: 2 Primary Etiology: Lymphedema Wound Location: Left Lower Leg - Posterior Wound Status: Open Wounding Event: Gradually Appeared Date Acquired: 04/19/2016 Weeks Of Treatment: 5 Clustered Wound: Yes Photos Wound Measurements Length: (cm) 1.2 Width: (cm) 0.6 Depth: (cm) 0.1 Area: (cm) 0.565 Volume: (cm) 0.057 % Reduction in Area: 33.4% % Reduction in Volume: 32.9% Epithelialization: None Tunneling: No Undermining: No Wound Description Classification: Partial Thickness Wound Margin: Flat and Intact Exudate Amount: Large Exudate Type: Serosanguineous Exudate Color: red, brown Foul Odor After Cleansing: No Wound Bed Granulation Amount: None Present (0%) Exposed Structure Necrotic Amount: Large (67-100%) Fascia Exposed: No Necrotic Quality: Eschar Fat Layer Exposed: No Tendon Exposed: No Muscle Exposed: No Zerkle, Aryiah L. ([id number]) Joint Exposed: No Bone Exposed: No Limited to Skin Breakdown Periwound Skin Texture Texture Color No Abnormalities Noted: No No Abnormalities Noted: No Callus: No Atrophie Blanche: No Crepitus: No Cyanosis: No Excoriation: No Ecchymosis: No Fluctuance: No Erythema: Yes Friable: No Erythema Location: Circumferential Induration: No Hemosiderin Staining: No Localized Edema: No Mottled: No Rash: No Pallor: No Scarring: No Rubor: No Moisture Temperature / Pain No Abnormalities Noted: No Tenderness on Palpation: Yes Dry / Scaly: No Maceration: No Moist: No Wound Preparation Ulcer Cleansing: Other: soap and water, Topical Anesthetic Applied: Other: lidocaine 4%, Treatment Notes Wound #2 (Left, Posterior Lower Leg) 1. Cleansed with: Cleanse wound with antibacterial soap and  water 4. Dressing Applied: Other dressing (specify in notes) 5. Secondary Dressing Applied ABD Pad 7. Secured with 4  Layer Compression System - Bilateral Notes [place] Electronic Signature(s) Signed: 06/11/2016 5:04:12 PM By: Curtis Sites Entered By: Curtis Sites on 06/11/2016 15:04:48 Chouinard, Yohana Elbert Ewings ([id number]) -------------------------------------------------------------------------------- Wound Assessment Details Patient Name: Wever, Chiann L. Date of Service: 06/11/2016 2:15 PM Medical Record Number: [id number] Patient Account Number: [id number] Date of Birth/Sex: [birth year] (60 y.o. Female) Treating RN: Curtis Sites Primary Care Physician: Sherrie Mustache Other Clinician: Referring Physician: Sherrie Mustache Treating Physician/Extender: Rudene Re in Treatment: 24 Wound Status Wound Number: 3 Primary Etiology: Venous Leg Ulcer Wound Location: Right Lower Leg - Medial, Wound Status: Open Proximal Wounding Event: Blister Date Acquired: 05/18/2016 Weeks Of Treatment: 3 Clustered Wound: No Photos Wound Measurements Length: (cm) 1.2 Width: (cm) 2 Depth: (cm) 0.1 Area: (cm) 1.885 Volume: (cm) 0.188 % Reduction in Area: 90.9% % Reduction in Volume: 95.5% Epithelialization: None Tunneling: No Undermining: No Wound Description Full Thickness Without Exposed Classification: Support Structures Wound Margin: Flat and Intact Exudate Large Amount: Exudate Type: Serosanguineous Exudate Color: red, brown Foul Odor After Cleansing: No Wound Bed Granulation Amount: None Present (0%) Exposed Structure Necrotic Amount: Large (67-100%) Fascia Exposed: No Necrotic Quality: Eschar Fat Layer Exposed: No Chuang, Vincenta L. ([id number]) Tendon Exposed: No Muscle Exposed: No Joint Exposed: No Bone Exposed: No Limited to Skin Breakdown Periwound Skin Texture Texture Color No Abnormalities Noted: No No Abnormalities Noted: No Callus:  No Atrophie Blanche: No Crepitus: No Cyanosis: No Excoriation: No Ecchymosis: No Fluctuance: No Erythema: Yes Friable: No Erythema Location: Circumferential Induration: No Hemosiderin Staining: No Localized Edema: No Mottled: No Rash: No Pallor: No Scarring: No Rubor: No Moisture Temperature / Pain No Abnormalities Noted: No Tenderness on Palpation: Yes Dry / Scaly: No Maceration: No Moist: No Wound Preparation Ulcer Cleansing: Other: soap and water, Topical Anesthetic Applied: Other: lidocaine 4%, Treatment Notes Wound #3 (Right, Proximal, Medial Lower Leg) 1. Cleansed with: Cleanse wound with antibacterial soap and water 4. Dressing Applied: Other dressing (specify in notes) 5. Secondary Dressing Applied ABD Pad 7. Secured with 4 Layer Compression System - Bilateral Notes [place] Electronic Signature(s) Signed: 06/11/2016 5:04:12 PM By: Curtis Sites Entered By: Curtis Sites on 06/11/2016 15:06:43 Summerlin, [name] ([id number]) -------------------------------------------------------------------------------- Wound Assessment Details Patient Name: Yeatman, Serrena L. Date of Service: 06/11/2016 2:15 PM Medical Record Number: [id number] Patient Account Number: [id number] Date of Birth/Sex: Sep 02, 1955 (60 y.o. Female) Treating RN: Curtis Sites Primary Care Physician: Sherrie Mustache Other Clinician: Referring Physician: Sherrie Mustache Treating Physician/Extender: Rudene Re in Treatment: 24 Wound Status Wound Number: 4 Primary Etiology: Venous Leg Ulcer Wound Location: Right, Posterior Lower Leg Wound Status: Open Wounding Event: Gradually Appeared Date Acquired: 05/25/2016 Weeks Of Treatment: 2 Clustered Wound: No Wound Measurements Length: (cm) 0.5 Width: (cm) 0.4 Depth: (cm) 0.1 Area: (cm) 0.157 Volume: (cm) 0.016 % Reduction in Area: 88.9% % Reduction in Volume: 88.7% Wound Description Classification: Partial  Thickness Periwound Skin Texture Texture Color No Abnormalities Noted: No No Abnormalities Noted: No Moisture No Abnormalities Noted: No Treatment Notes Wound #4 (Right, Posterior Lower Leg) 1. Cleansed with: Cleanse wound with antibacterial soap and water 4. Dressing Applied: Other dressing (specify in notes) 5. Secondary Dressing Applied ABD Pad 7. Secured with 4 Layer Compression System - Bilateral Notes [place] Electronic Signature(s) AMNA, WELKER ([id number]) Signed: 06/11/2016 5:04:12 PM By: Curtis Sites Entered By: Curtis Sites on 06/11/2016 14:58:15 Constancio, [name] ([id number]) -------------------------------------------------------------------------------- Wound Assessment Details Patient Name: Check, Rilee L. Date of Service: 06/11/2016 2:15 PM Medical Record Number: [id number] Patient Account  Number: [id number] Date of Birth/Sex: [birth year] (60 y.o. Female) Treating RN: Curtis Sites Primary Care Physician: Sherrie Mustache Other Clinician: Referring Physician: Sherrie Mustache Treating Physician/Extender: Rudene Re in Treatment: 24 Wound Status Wound Number: 5 Primary Etiology: Venous Leg Ulcer Wound Location: Left Lower Leg - Medial, Wound Status: Healed - Epithelialized Proximal Wounding Event: Blister Date Acquired: 05/25/2016 Weeks Of Treatment: 2 Clustered Wound: No Photos Wound Measurements Length: (cm) 0 % Reduction i Width: (cm) 0 % Reduction i Depth: (cm) 0 Area: (cm) 0 Volume: (cm) 0 n Area: 100% n Volume: 100% Wound Description Classification: Partial Thickness Periwound Skin Texture Texture Color No Abnormalities Noted: No No Abnormalities Noted: No Moisture No Abnormalities Noted: No Electronic Signature(s) Signed: 06/11/2016 5:04:12 PM By: Micah Noel, [name] ([id number]) Entered By: Curtis Sites on 06/11/2016 15:07:27 Aerts, Ayriana Elbert Ewings  ([id number]) -------------------------------------------------------------------------------- Wound Assessment Details Patient Name: Fickling, Alaiyah L. Date of Service: 06/11/2016 2:15 PM Medical Record Number: [id number] Patient Account Number: [id number] Date of Birth/Sex: 1956/07/30 (60 y.o. Female) Treating RN: Curtis Sites Primary Care Physician: Sherrie Mustache Other Clinician: Referring Physician: Sherrie Mustache Treating Physician/Extender: Rudene Re in Treatment: 24 Wound Status Wound Number: 6 Primary Etiology: Venous Leg Ulcer Wound Location: Right Lower Leg - Anterior Wound Status: Open Wounding Event: Gradually Appeared Date Acquired: 06/11/2016 Weeks Of Treatment: 0 Clustered Wound: No Photos Wound Measurements Length: (cm) 1.5 Width: (cm) 0.9 Depth: (cm) 0.1 Area: (cm) 1.06 Volume: (cm) 0.106 % Reduction in Area: % Reduction in Volume: Epithelialization: None Tunneling: No Undermining: No Wound Description Classification: Partial Thickness Wound Margin: Flat and Intact Exudate Amount: Large Exudate Type: Serosanguineous Exudate Color: red, brown Foul Odor After Cleansing: No Wound Bed Granulation Amount: None Present (0%) Necrotic Amount: Large (67-100%) Necrotic Quality: Eschar Periwound Skin Texture Texture Color Mckneely, Cristi L. ([id number]) No Abnormalities Noted: No No Abnormalities Noted: No Callus: No Atrophie Blanche: No Crepitus: No Cyanosis: No Excoriation: No Ecchymosis: No Fluctuance: No Erythema: Yes Friable: No Erythema Location: Circumferential Induration: No Hemosiderin Staining: No Localized Edema: No Mottled: No Rash: No Pallor: No Scarring: No Rubor: No Moisture Temperature / Pain No Abnormalities Noted: No Tenderness on Palpation: Yes Dry / Scaly: No Maceration: No Moist: No Wound Preparation Ulcer Cleansing: Other: soap and water, Topical Anesthetic Applied: Other: lidocaine 4%, Treatment  Notes Wound #6 (Right, Anterior Lower Leg) 1. Cleansed with: Cleanse wound with antibacterial soap and water 4. Dressing Applied: Other dressing (specify in notes) 5. Secondary Dressing Applied ABD Pad 7. Secured with 4 Layer Compression System - Bilateral Notes [place] Electronic Signature(s) Signed: 06/11/2016 5:04:12 PM By: Curtis Sites Entered By: Curtis Sites on 06/11/2016 15:00:04 Pauls, [name] ([id number]) -------------------------------------------------------------------------------- Vitals Details Patient Name: Cowman, Arlis L. Date of Service: 06/11/2016 2:15 PM Medical Record Number: [id number] Patient Account Number: [id number] Date of Birth/Sex: 05-Feb-1956 (60 y.o. Female) Treating RN: Curtis Sites Primary Care Physician: Sherrie Mustache Other Clinician: Referring Physician: Sherrie Mustache Treating Physician/Extender: Rudene Re in Treatment: 24 Vital Signs Time Taken: 14:23 Temperature (F): 98.5 Height (in): 62 Pulse (bpm): 93 Weight (lbs): 170 Respiratory Rate (breaths/min): 20 Body Mass Index (BMI): 31.1 Blood Pressure (mmHg): 122/59 Reference Range: 80 - 120 mg / dl Electronic Signature(s) Signed: 06/11/2016 5:04:12 PM By: Curtis Sites Entered By: Curtis Sites on 06/11/2016 14:24:30

## 2016-06-15 ENCOUNTER — Encounter: Payer: Medicare Other | Admitting: Physician Assistant

## 2016-06-15 DIAGNOSIS — I87331 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 lower extremity: Secondary | ICD-10-CM | POA: Diagnosis not present

## 2016-06-16 ENCOUNTER — Ambulatory Visit: Payer: Medicare Other | Admitting: Internal Medicine

## 2016-06-16 NOTE — Progress Notes (Signed)
[name], Trease L. ([id number]) Visit Report for 06/15/2016 Arrival Information Details Patient Name: [name], [name] Date of Service: 06/15/2016 4:00 PM Medical Record Number: [id number] Patient Account Number: [id number] Date of Birth/Sex: 07/08/1956 [age] y.o. Female) Treating RN: Curtis Sitesorthy, Joanna Primary Care Physician: Sherrie MustacheJADALI, FAYEGH Other Clinician: Referring Physician: Sherrie MustacheJADALI, FAYEGH Treating Physician/Extender: Linwood DibblesSTONE III, HOYT Weeks in Treatment: 25 Visit Information History Since Last Visit Added or deleted any medications: No Patient Arrived: Walker Any new allergies or adverse reactions: No Arrival Time: 15:57 Had a fall or experienced change in No Accompanied By: friend activities of daily living that may affect Transfer Assistance: None risk of falls: Patient Identification Verified: Yes Signs or symptoms of abuse/neglect since last No Secondary Verification Process Completed: Yes visito Patient Requires Transmission-Based No Hospitalized since last visit: No Precautions: Pain Present Now: No Patient Has Alerts: No Electronic Signature(s) Signed: 06/15/2016 5:11:35 PM By: Curtis Sitesorthy, Joanna Entered By: Curtis Sitesorthy, Joanna on 06/15/2016 15:59:38 Winecoff, [name] ([id number]) -------------------------------------------------------------------------------- Encounter Discharge Information Details Patient Name: [name], [name] Date of Service: 06/15/2016 4:00 PM Medical Record Number: [id number] Patient Account Number: [id number] Date of Birth/Sex: 12/23/1955 [age] y.o. Female) Treating RN: Curtis Sitesorthy, Joanna Primary Care Physician: Sherrie MustacheJADALI, FAYEGH Other Clinician: Referring Physician: Sherrie MustacheJADALI, FAYEGH Treating Physician/Extender: Linwood DibblesSTONE III, HOYT Weeks in Treatment: 25 Encounter Discharge Information Items Discharge Pain Level: 0 Discharge Condition: Stable Ambulatory Status: Walker Discharge Destination: Home Transportation: Private Auto Accompanied By: friend Schedule  Follow-up Appointment: Yes Medication Reconciliation completed and provided to Patient/Care No Jaydan Chretien: Provided on Clinical Summary of Care: 06/15/2016 Form Type Recipient Paper Patient GC Electronic Signature(s) Signed: 06/15/2016 5:10:59 PM By: Curtis Sitesorthy, Joanna Previous Signature: 06/15/2016 4:59:34 PM Version By: Gwenlyn PerkingMoore, Shelia Entered By: Curtis Sitesorthy, Joanna on 06/15/2016 17:10:58 [name], [name] ([id number]) -------------------------------------------------------------------------------- Lower Extremity Assessment Details Patient Name: [name], [name] Date of Service: 06/15/2016 4:00 PM Medical Record Number: [id number] Patient Account Number: [id number] Date of Birth/Sex: 04/27/1956 [age] y.o. Female) Treating RN: Curtis Sitesorthy, Joanna Primary Care Physician: Sherrie MustacheJADALI, FAYEGH Other Clinician: Referring Physician: Sherrie MustacheJADALI, FAYEGH Treating Physician/Extender: Linwood DibblesSTONE III, HOYT Weeks in Treatment: 25 Edema Assessment Assessed: [Left: No] [Right: No] Edema: [Left: Yes] [Right: Yes] Calf Left: Right: Point of Measurement: 31 cm From Medial Instep cm cm Ankle Left: Right: Point of Measurement: 11 cm From Medial Instep cm cm Vascular Assessment Pulses: Posterior Tibial Dorsalis Pedis Palpable: [Left:Yes] [Right:Yes] Extremity colors, hair growth, and conditions: Extremity Color: [Left:Hyperpigmented] [Right:Hyperpigmented] Hair Growth on Extremity: [Left:No] [Right:No] Temperature of Extremity: [Left:Warm] [Right:Warm] Capillary Refill: [Left:< 3 seconds] [Right:< 3 seconds] Electronic Signature(s) Signed: 06/15/2016 5:11:35 PM By: Curtis Sitesorthy, Joanna Entered By: Curtis Sitesorthy, Joanna on 06/15/2016 16:29:39 [name], [name] ([id number]) -------------------------------------------------------------------------------- Multi Wound Chart Details Patient Name: [name], [name] Date of Service: 06/15/2016 4:00 PM Medical Record Number: [id number] Patient Account Number: [id number] Date of  Birth/Sex: 10/02/1955 [age] y.o. Female) Treating RN: Curtis Sitesorthy, Joanna Primary Care Physician: Sherrie MustacheJADALI, FAYEGH Other Clinician: Referring Physician: Sherrie MustacheJADALI, FAYEGH Treating Physician/Extender: Linwood DibblesSTONE III, HOYT Weeks in Treatment: 25 Vital Signs Height(in): 62 Pulse(bpm): 94 Weight(lbs): 170 Blood Pressure 138/43 (mmHg): Body Mass Index(BMI): 31 Temperature(F): 98.4 Respiratory Rate 20 (breaths/min): Photos: Wound Location: Right Lower Leg - Medial Left Lower Leg - Posterior Right Lower Leg - Medial, Proximal Wounding Event: Blister Gradually Appeared Blister Primary Etiology: Venous Leg Ulcer Lymphedema Venous Leg Ulcer Date Acquired: 12/15/2015 04/19/2016 05/18/2016 Weeks of Treatment: 25 6 4  Wound Status: Open Open Open Clustered Wound: No Yes No Measurements L x W x D 1.6x0.8x0.1 1x0.4x0.1 0.5x1x0.1 (cm) Area (cm) : 1.005 0.314  0.393 Volume (cm) : 0.101 0.031 0.039 % Reduction in Area: 96.10% 63.00% 98.10% % Reduction in Volume: 96.10% 63.50% 99.10% Classification: Partial Thickness Partial Thickness Full Thickness Without Exposed Support Structures Exudate Amount: Large Large Large Exudate Type: Serosanguineous Serosanguineous Serosanguineous Exudate Color: red, brown red, brown red, brown Wound Margin: Flat and Intact Flat and Intact Flat and Intact Granulation Amount: Large (67-100%) Large (67-100%) Large (67-100%) Granulation Quality: Red Red Red Dingledine, Shalin L. ([id number]) Necrotic Amount: None Present (0%) None Present (0%) None Present (0%) Exposed Structures: Fascia: No Fascia: No Fascia: No Fat: No Fat: No Fat: No Tendon: No Tendon: No Tendon: No Muscle: No Muscle: No Muscle: No Joint: No Joint: No Joint: No Bone: No Bone: No Bone: No Limited to Skin Limited to Skin Limited to Skin Breakdown Breakdown Breakdown Epithelialization: None None None Periwound Skin Texture: Edema: No Edema: No Edema: No Excoriation: No Excoriation:  No Excoriation: No Induration: No Induration: No Induration: No Callus: No Callus: No Callus: No Crepitus: No Crepitus: No Crepitus: No Fluctuance: No Fluctuance: No Fluctuance: No Friable: No Friable: No Friable: No Rash: No Rash: No Rash: No Scarring: No Scarring: No Scarring: No Periwound Skin Maceration: No Maceration: No Maceration: No Moisture: Moist: No Moist: No Moist: No Dry/Scaly: No Dry/Scaly: No Dry/Scaly: No Periwound Skin Color: Atrophie Blanche: No Erythema: Yes Erythema: Yes Cyanosis: No Atrophie Blanche: No Atrophie Blanche: No Ecchymosis: No Cyanosis: No Cyanosis: No Erythema: No Ecchymosis: No Ecchymosis: No Hemosiderin Staining: No Hemosiderin Staining: No Hemosiderin Staining: No Mottled: No Mottled: No Mottled: No Pallor: No Pallor: No Pallor: No Rubor: No Rubor: No Rubor: No Erythema Location: N/A Circumferential Circumferential Tenderness on Yes Yes Yes Palpation: Wound Preparation: Ulcer Cleansing: Other: Ulcer Cleansing: Other: Ulcer Cleansing: Other: soap and water soap and water soap and water Topical Anesthetic Topical Anesthetic Topical Anesthetic Applied: Other: lidocaine Applied: Other: lidocaine Applied: Other: lidocaine 4% 4% 4% Wound Number: 4 6 N/A Photos: N/A Wound Location: Right Lower Leg - Right Lower Leg - Anterior N/A Posterior Mowers, Elias L. ([id number]) Wounding Event: Gradually Appeared Gradually Appeared N/A Primary Etiology: Venous Leg Ulcer Venous Leg Ulcer N/A Date Acquired: 05/25/2016 06/11/2016 N/A Weeks of Treatment: 3 0 N/A Wound Status: Healed - Epithelialized Open N/A Clustered Wound: No No N/A Measurements L x W x D 0x0x0 0.6x1x0.1 N/A (cm) Area (cm) : 0 0.471 N/A Volume (cm) : 0 0.047 N/A % Reduction in Area: 100.00% 55.60% N/A % Reduction in Volume: 100.00% 55.70% N/A Classification: Partial Thickness Partial Thickness N/A Exudate Amount: None Present Large N/A Exudate  Type: N/A Serosanguineous N/A Exudate Color: N/A red, brown N/A Wound Margin: N/A Flat and Intact N/A Granulation Amount: None Present (0%) Large (67-100%) N/A Granulation Quality: N/A Red N/A Necrotic Amount: None Present (0%) None Present (0%) N/A Exposed Structures: N/A N/A N/A Epithelialization: Large (67-100%) None N/A Periwound Skin Texture: No Abnormalities Noted Edema: No N/A Excoriation: No Induration: No Callus: No Crepitus: No Fluctuance: No Friable: No Rash: No Scarring: No Periwound Skin No Abnormalities Noted Maceration: No N/A Moisture: Moist: No Dry/Scaly: No Periwound Skin Color: No Abnormalities Noted Erythema: Yes N/A Atrophie Blanche: No Cyanosis: No Ecchymosis: No Hemosiderin Staining: No Mottled: No Pallor: No Rubor: No Erythema Location: N/A Circumferential N/A Tenderness on No Yes N/A Palpation: Wound Preparation: Ulcer Cleansing: Other: Ulcer Cleansing: Other: N/A soap and water soap and water Topical Anesthetic Carrier, Rubena L. ([id number]) Topical Anesthetic Applied: Other: lidocaine Applied: None 4% Treatment Notes Electronic Signature(s) Signed: 06/15/2016 5:11:35  PM By: Curtis Sites Entered By: Curtis Sites on 06/15/2016 16:30:13 Vencill, [name] ([id number]) -------------------------------------------------------------------------------- Multi-Disciplinary Care Plan Details Patient Name: Spadafore, Acire L. Date of Service: 06/15/2016 4:00 PM Medical Record Number: [id number] Patient Account Number: [id number] Date of Birth/Sex: June 23, 1956 (60 y.o. Female) Treating RN: Curtis Sites Primary Care Physician: Sherrie Mustache Other Clinician: Referring Physician: Sherrie Mustache Treating Physician/Extender: Linwood Dibbles, HOYT Weeks in Treatment: 25 Active Inactive Abuse / Safety / Falls / Self Care Management Nursing Diagnoses: Impaired physical mobility Potential for falls Goals: Patient will remain injury free Date  Initiated: 12/23/2015 Goal Status: Active Interventions: Assess fall risk on admission and as needed Notes: Nutrition Nursing Diagnoses: Imbalanced nutrition Goals: Patient/caregiver verbalizes understanding of need to maintain therapeutic glucose control per primary care physician Date Initiated: 12/23/2015 Goal Status: Active Interventions: Provide education on nutrition Treatment Activities: Education provided on Nutrition : 01/21/2016 Notes: Orientation to the Wound Care Program Nursing Diagnoses: DESTIN, KITTLER ([id number]) Knowledge deficit related to the wound healing center program Goals: Patient/caregiver will verbalize understanding of the Wound Healing Center Program Date Initiated: 12/23/2015 Goal Status: Active Interventions: Provide education on orientation to the wound center Notes: Venous Leg Ulcer Nursing Diagnoses: Potential for venous Insuffiency (use before diagnosis confirmed) Goals: Patient will maintain optimal edema control Date Initiated: 12/23/2015 Goal Status: Active Interventions: Provide education on venous insufficiency Notes: Wound/Skin Impairment Nursing Diagnoses: Impaired tissue integrity Goals: Ulcer/skin breakdown will heal within 14 weeks Date Initiated: 12/23/2015 Goal Status: Active Interventions: Assess ulceration(s) every visit Notes: Electronic Signature(s) Signed: 06/15/2016 5:11:35 PM By: Curtis Sites Entered By: Curtis Sites on 06/15/2016 16:30:05 Patras, [name] ([id number]) -------------------------------------------------------------------------------- Pain Assessment Details Patient Name: Haning, Liani L. Date of Service: 06/15/2016 4:00 PM Medical Record Number: [id number] Patient Account Number: [id number] Date of Birth/Sex: [birth year] (60 y.o. Female) Treating RN: Curtis Sites Primary Care Physician: Sherrie Mustache Other Clinician: Referring Physician: Sherrie Mustache Treating Physician/Extender: Linwood Dibbles, HOYT Weeks in Treatment: 25 Active Problems Location of Pain Severity and Description of Pain Patient Has Paino No Site Locations Pain Management and Medication Current Pain Management: Notes Topical or injectable lidocaine is offered to patient for acute pain when surgical debridement is performed. If needed, Patient is instructed to use over the counter pain medication for the following 24-48 hours after debridement. Wound care MDs do not prescribed pain medications. Patient has chronic pain or uncontrolled pain. Patient has been instructed to make an appointment with their Primary Care Physician for pain management. Electronic Signature(s) Signed: 06/15/2016 5:11:35 PM By: Curtis Sites Entered By: Curtis Sites on 06/15/2016 15:59:46 Mckesson, [name] ([id number]) -------------------------------------------------------------------------------- Patient/Caregiver Education Details Patient Name: Mccuistion, Oriyah L. Date of Service: 06/15/2016 4:00 PM Medical Record Number: [id number] Patient Account Number: [id number] Date of Birth/Gender: [birth year] (60 y.o. Female) Treating RN: Curtis Sites Primary Care Physician: Sherrie Mustache Other Clinician: Referring Physician: Sherrie Mustache Treating Physician/Extender: Skeet Simmer in Treatment: 25 Education Assessment Education Provided To: Patient Education Topics Provided Venous: Handouts: Other: leg elevation Methods: Explain/Verbal Responses: State content correctly Electronic Signature(s) Signed: 06/15/2016 5:11:35 PM By: Curtis Sites Entered By: Curtis Sites on 06/15/2016 17:11:18 Blackston, Brittanee Elbert Ewings ([id number]) -------------------------------------------------------------------------------- Wound Assessment Details Patient Name: Kreuser, Josey L. Date of Service: 06/15/2016 4:00 PM Medical Record Number: [id number] Patient Account Number: [id number] Date of Birth/Sex: 07-09-1956 (60 y.o.  Female) Treating RN: Curtis Sites Primary Care Physician: Sherrie Mustache Other Clinician: Referring Physician: Sherrie Mustache Treating Physician/Extender: STONE III, HOYT Weeks in Treatment: 25 Wound Status Wound Number: 1 Primary  Etiology: Venous Leg Ulcer Wound Location: Right Lower Leg - Medial Wound Status: Open Wounding Event: Blister Date Acquired: 12/15/2015 Weeks Of Treatment: 25 Clustered Wound: No Photos Wound Measurements Length: (cm) 1.6 Width: (cm) 0.8 Depth: (cm) 0.1 Area: (cm) 1.005 Volume: (cm) 0.101 % Reduction in Area: 96.1% % Reduction in Volume: 96.1% Epithelialization: None Tunneling: No Undermining: No Wound Description Classification: Partial Thickness Wound Margin: Flat and Intact Exudate Amount: Large Exudate Type: Serosanguineous Exudate Color: red, brown Foul Odor After Cleansing: No Wound Bed Granulation Amount: Large (67-100%) Exposed Structure Granulation Quality: Red Fascia Exposed: No Necrotic Amount: None Present (0%) Fat Layer Exposed: No Tendon Exposed: No Muscle Exposed: No Dirr, Jeanet L. ([id number]) Joint Exposed: No Bone Exposed: No Limited to Skin Breakdown Periwound Skin Texture Texture Color No Abnormalities Noted: No No Abnormalities Noted: No Callus: No Atrophie Blanche: No Crepitus: No Cyanosis: No Excoriation: No Ecchymosis: No Fluctuance: No Erythema: No Friable: No Hemosiderin Staining: No Induration: No Mottled: No Localized Edema: No Pallor: No Rash: No Rubor: No Scarring: No Temperature / Pain Moisture Tenderness on Palpation: Yes No Abnormalities Noted: No Dry / Scaly: No Maceration: No Moist: No Wound Preparation Ulcer Cleansing: Other: soap and water, Topical Anesthetic Applied: Other: lidocaine 4%, Treatment Notes Wound #1 (Right, Medial Lower Leg) 1. Cleansed with: Cleanse wound with antibacterial soap and water 3. Peri-wound Care: Moisturizing lotion 4. Dressing  Applied: Other dressing (specify in notes) 5. Secondary Dressing Applied ABD Pad 7. Secured with 4 Layer Compression System - Bilateral Notes sorbal[place] ag Electronic Signature(s) Signed: 06/15/2016 5:11:35 PM By: Curtis Sites Entered By: Curtis Sites on 06/15/2016 16:26:43 Rothgeb, Kiona Elbert Ewings ([id number]) -------------------------------------------------------------------------------- Wound Assessment Details Patient Name: Guest, Symia L. Date of Service: 06/15/2016 4:00 PM Medical Record Number: [id number] Patient Account Number: [id number] Date of Birth/Sex: October 31, 1955 (60 y.o. Female) Treating RN: Curtis Sites Primary Care Physician: Sherrie Mustache Other Clinician: Referring Physician: Sherrie Mustache Treating Physician/Extender: Linwood Dibbles, HOYT Weeks in Treatment: 25 Wound Status Wound Number: 2 Primary Etiology: Lymphedema Wound Location: Left Lower Leg - Posterior Wound Status: Open Wounding Event: Gradually Appeared Date Acquired: 04/19/2016 Weeks Of Treatment: 6 Clustered Wound: Yes Photos Wound Measurements Length: (cm) 1 Width: (cm) 0.4 Depth: (cm) 0.1 Area: (cm) 0.314 Volume: (cm) 0.031 % Reduction in Area: 63% % Reduction in Volume: 63.5% Epithelialization: None Tunneling: No Undermining: No Wound Description Classification: Partial Thickness Wound Margin: Flat and Intact Exudate Amount: Large Exudate Type: Serosanguineous Exudate Color: red, brown Foul Odor After Cleansing: No Wound Bed Granulation Amount: Large (67-100%) Exposed Structure Granulation Quality: Red Fascia Exposed: No Necrotic Amount: None Present (0%) Fat Layer Exposed: No Tendon Exposed: No Muscle Exposed: No Zufall, Arna L. ([id number]) Joint Exposed: No Bone Exposed: No Limited to Skin Breakdown Periwound Skin Texture Texture Color No Abnormalities Noted: No No Abnormalities Noted: No Callus: No Atrophie Blanche: No Crepitus: No Cyanosis: No Excoriation:  No Ecchymosis: No Fluctuance: No Erythema: Yes Friable: No Erythema Location: Circumferential Induration: No Hemosiderin Staining: No Localized Edema: No Mottled: No Rash: No Pallor: No Scarring: No Rubor: No Moisture Temperature / Pain No Abnormalities Noted: No Tenderness on Palpation: Yes Dry / Scaly: No Maceration: No Moist: No Wound Preparation Ulcer Cleansing: Other: soap and water, Topical Anesthetic Applied: Other: lidocaine 4%, Treatment Notes Wound #2 (Left, Posterior Lower Leg) 1. Cleansed with: Cleanse wound with antibacterial soap and water 3. Peri-wound Care: Moisturizing lotion 4. Dressing Applied: Other dressing (specify in notes) 5. Secondary Dressing Applied ABD Pad 7. Secured with 4  Layer Compression System - Bilateral Notes [place]bal[place] ag Electronic Signature(s) Signed: 06/15/2016 5:11:35 PM By: Curtis Sites Entered By: Curtis Sites on 06/15/2016 16:27:21 Staten, Aryona Elbert Ewings ([id number]) -------------------------------------------------------------------------------- Wound Assessment Details Patient Name: Dake, Zanita L. Date of Service: 06/15/2016 4:00 PM Medical Record Number: [id number] Patient Account Number: [id number] Date of Birth/Sex: January 30, 1956 (60 y.o. Female) Treating RN: Curtis Sites Primary Care Physician: Sherrie Mustache Other Clinician: Referring Physician: Sherrie Mustache Treating Physician/Extender: Linwood Dibbles, HOYT Weeks in Treatment: 25 Wound Status Wound Number: 3 Primary Etiology: Venous Leg Ulcer Wound Location: Right Lower Leg - Medial, Wound Status: Open Proximal Wounding Event: Blister Date Acquired: 05/18/2016 Weeks Of Treatment: 4 Clustered Wound: No Photos Wound Measurements Length: (cm) 0.5 Width: (cm) 1 Depth: (cm) 0.1 Area: (cm) 0.393 Volume: (cm) 0.039 % Reduction in Area: 98.1% % Reduction in Volume: 99.1% Epithelialization: None Tunneling: No Undermining: No Wound Description Full  Thickness Without Exposed Classification: Support Structures Wound Margin: Flat and Intact Exudate Large Amount: Exudate Type: Serosanguineous Exudate Color: red, brown Foul Odor After Cleansing: No Wound Bed Granulation Amount: Large (67-100%) Exposed Structure Granulation Quality: Red Fascia Exposed: No Necrotic Amount: None Present (0%) Fat Layer Exposed: No Yeoman, Kalika L. ([id number]) Tendon Exposed: No Muscle Exposed: No Joint Exposed: No Bone Exposed: No Limited to Skin Breakdown Periwound Skin Texture Texture Color No Abnormalities Noted: No No Abnormalities Noted: No Callus: No Atrophie Blanche: No Crepitus: No Cyanosis: No Excoriation: No Ecchymosis: No Fluctuance: No Erythema: Yes Friable: No Erythema Location: Circumferential Induration: No Hemosiderin Staining: No Localized Edema: No Mottled: No Rash: No Pallor: No Scarring: No Rubor: No Moisture Temperature / Pain No Abnormalities Noted: No Tenderness on Palpation: Yes Dry / Scaly: No Maceration: No Moist: No Wound Preparation Ulcer Cleansing: Other: soap and water, Topical Anesthetic Applied: Other: lidocaine 4%, Treatment Notes Wound #3 (Right, Proximal, Medial Lower Leg) 1. Cleansed with: Cleanse wound with antibacterial soap and water 3. Peri-wound Care: Moisturizing lotion 4. Dressing Applied: Other dressing (specify in notes) 5. Secondary Dressing Applied ABD Pad 7. Secured with 4 Layer Compression System - Bilateral Notes [place]bal[place] ag Electronic Signature(s) Signed: 06/15/2016 5:11:35 PM By: Curtis Sites Entered By: Curtis Sites on 06/15/2016 16:27:50 Loring, [name] ([id number]) Trigo, Itzy Elbert Ewings ([id number]) -------------------------------------------------------------------------------- Wound Assessment Details Patient Name: Brathwaite, Hooria L. Date of Service: 06/15/2016 4:00 PM Medical Record Number: [id number] Patient Account Number: [id number] Date of  Birth/Sex: 1956/03/20 (60 y.o. Female) Treating RN: Curtis Sites Primary Care Physician: Sherrie Mustache Other Clinician: Referring Physician: Sherrie Mustache Treating Physician/Extender: Linwood Dibbles, HOYT Weeks in Treatment: 25 Wound Status Wound Number: 4 Primary Etiology: Venous Leg Ulcer Wound Location: Right Lower Leg - Posterior Wound Status: Healed - Epithelialized Wounding Event: Gradually Appeared Date Acquired: 05/25/2016 Weeks Of Treatment: 3 Clustered Wound: No Photos Wound Measurements Length: (cm) 0 % Reduction i Width: (cm) 0 % Reduction i Depth: (cm) 0 Epithelializa Area: (cm) 0 Tunneling: Volume: (cm) 0 Undermining: n Area: 100% n Volume: 100% tion: Large (67-100%) No No Wound Description Classification: Partial Thickness Exudate Amount: None Present Wound Bed Granulation Amount: None Present (0%) Necrotic Amount: None Present (0%) Periwound Skin Texture Texture Color No Abnormalities Noted: No No Abnormalities Noted: No Moisture No Abnormalities Noted: No Odonovan, Ahlayah L. ([id number]) Wound Preparation Ulcer Cleansing: Other: soap and water, Topical Anesthetic Applied: None Electronic Signature(s) Signed: 06/15/2016 5:11:35 PM By: Curtis Sites Entered By: Curtis Sites on 06/15/2016 16:28:33 Sparger, Signora Elbert Ewings ([id number]) -------------------------------------------------------------------------------- Wound Assessment Details Patient Name: Scheibel, Gabriel L. Date of Service:  06/15/2016 4:00 PM Medical Record Number: [id number] Patient Account Number: [id number] Date of Birth/Sex: 03/10/1956 (60 y.o. Female) Treating RN: Curtis Sites Primary Care Physician: Sherrie Mustache Other Clinician: Referring Physician: Sherrie Mustache Treating Physician/Extender: Linwood Dibbles, HOYT Weeks in Treatment: 25 Wound Status Wound Number: 6 Primary Etiology: Venous Leg Ulcer Wound Location: Right Lower Leg - Anterior Wound Status: Open Wounding  Event: Gradually Appeared Date Acquired: 06/11/2016 Weeks Of Treatment: 0 Clustered Wound: No Photos Wound Measurements Length: (cm) 0.6 Width: (cm) 1 Depth: (cm) 0.1 Area: (cm) 0.471 Volume: (cm) 0.047 % Reduction in Area: 55.6% % Reduction in Volume: 55.7% Epithelialization: None Tunneling: No Undermining: No Wound Description Classification: Partial Thickness Wound Margin: Flat and Intact Exudate Amount: Large Exudate Type: Serosanguineous Exudate Color: red, brown Foul Odor After Cleansing: No Wound Bed Granulation Amount: Large (67-100%) Granulation Quality: Red Necrotic Amount: None Present (0%) Periwound Skin Texture Texture Color Delong, Verdella L. ([id number]) No Abnormalities Noted: No No Abnormalities Noted: No Callus: No Atrophie Blanche: No Crepitus: No Cyanosis: No Excoriation: No Ecchymosis: No Fluctuance: No Erythema: Yes Friable: No Erythema Location: Circumferential Induration: No Hemosiderin Staining: No Localized Edema: No Mottled: No Rash: No Pallor: No Scarring: No Rubor: No Moisture Temperature / Pain No Abnormalities Noted: No Tenderness on Palpation: Yes Dry / Scaly: No Maceration: No Moist: No Wound Preparation Ulcer Cleansing: Other: soap and water, Topical Anesthetic Applied: Other: lidocaine 4%, Treatment Notes Wound #6 (Right, Anterior Lower Leg) 1. Cleansed with: Cleanse wound with antibacterial soap and water 3. Peri-wound Care: Moisturizing lotion 4. Dressing Applied: Other dressing (specify in notes) 5. Secondary Dressing Applied ABD Pad 7. Secured with 4 Layer Compression System - Bilateral Notes [place]bal[place] ag Electronic Signature(s) Signed: 06/15/2016 5:11:35 PM By: Curtis Sites Entered By: Curtis Sites on 06/15/2016 16:29:03 Nase, Braya Elbert Ewings ([id number]) -------------------------------------------------------------------------------- Vitals Details Patient Name: Teffeteller, Houa L. Date of  Service: 06/15/2016 4:00 PM Medical Record Number: [id number] Patient Account Number: [id number] Date of Birth/Sex: Jun 05, 1956 (60 y.o. Female) Treating RN: Curtis Sites Primary Care Physician: Sherrie Mustache Other Clinician: Referring Physician: Sherrie Mustache Treating Physician/Extender: Linwood Dibbles, HOYT Weeks in Treatment: 25 Vital Signs Time Taken: 16:04 Temperature (F): 98.4 Height (in): 62 Pulse (bpm): 94 Weight (lbs): 170 Respiratory Rate (breaths/min): 20 Body Mass Index (BMI): 31.1 Blood Pressure (mmHg): 138/43 Reference Range: 80 - 120 mg / dl Electronic Signature(s) Signed: 06/15/2016 5:11:35 PM By: Curtis Sites Entered By: Curtis Sites on 06/15/2016 16:02:48

## 2016-06-16 NOTE — Progress Notes (Signed)
RAHI, CHANDONNET ([id number]) Visit Report for 06/15/2016 Chief Complaint Document Details Patient Name: [name], [name] Date of Service: 06/15/2016 4:00 PM Medical Record Number: [id number] Patient Account Number: [id number] Date of Birth/Sex: 01/08/1956 (60 y.o. Female) Treating RN: Curtis Sites Primary Care Physician: Sherrie Mustache Other Clinician: Referring Physician: Sherrie Mustache Treating Physician/Extender: Linwood Dibbles, Ashten Sarnowski Weeks in Treatment: 25 Information Obtained from: Patient Chief Complaint Patient is here for follow-up regarding her bilateral lower extremity venous stasis ulcers Electronic Signature(s) Signed: 06/16/2016 1:37:38 AM By: Lenda Kelp PA-C Entered By: Lenda Kelp on 06/15/2016 22:14:40 [name], [name] ([id number]) -------------------------------------------------------------------------------- HPI Details Patient Name: [name], [name] Date of Service: 06/15/2016 4:00 PM Medical Record Number: [id number] Patient Account Number: [id number] Date of Birth/Sex: 06/27/1956 (60 y.o. Female) Treating RN: Curtis Sites Primary Care Physician: Sherrie Mustache Other Clinician: Referring Physician: Sherrie Mustache Treating Physician/Extender: Linwood Dibbles, Marleah Beever Weeks in Treatment: 25 History of Present Illness HPI Description: 12/23/15; this is a 60 year old lady who recently relocated back to [place] from [place]. She is staying with a family friend previously lived with a daughter in [place]. 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 She is on chronic prednisone she is not a diabetic. Dose of prednisone is 10 mg. The patient tells me that roughly a week ago she developed a fairly substantial blister over this area that then ruptured. She was seen in the emergency room on 5/7 an x-ray of the leg was negative she was put on Septra although I don't think any cultures were done. She  also tells me she has had chronic edema in her legs for a number of years. She had a similar presentation to currently in the right lateral lower leg roughly a year ago that she was able to heal on her own. As noted she has systemic lupus but does not have lupus nephritis according to the patient. She has a lot of edema in her lower legs. The ABI's could not be obtained. 12/31/15; the patient's insurance which is [place] [employer] makes her out of network for any home health therefore we have been changing her dressing in our facility. I reviewed her trip to the ER on 12/21/2015 her creatinine was within the normal range hemoglobin and white count normal. Culture of the wound was negative. X-ray of the leg showed soft tissue edema no foreign bodies. We have been dressing this with Aquacel Ag due to the amount of ongoing drainage 01/07/16; the patient had her arterial studies this morning I don't have these results area she comes back in to have [place] rewrap since she doesn't have access to home health 01/14/16 I still do not have the results of her arterial studies. Our nurse correctly pointed out that we've been wrapping the left leg without an open wound I think this had to do with the fact that she had so much edema when she came in I was concerned about leaving this unwrapped. 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 We left her left leg unwrapped last week as she has no open wound here today she has extensive edema here. We did order stockings however I believe the company called but they have not called them back. She comes back in today with extensive edema in the left leg. We have her  arterial studies which showed triphasic waves and all locations tested including her posterior tibial and anterior tibial arteries bilaterally. Report listed no hemodynamically significant  bilateral lower extremity arterial stenosis. She has her venous studies later this month. I'm going to rewrap the left leg due to the extent of the edema, transitioning her into the stocking we ordered last week as soon as one is available 01/27/16 the patient arrived today with pooled serosanguineous drainage over the wound it would not absorb into her Aquacel Ag which is somewhat on. In spite of this the dimensions of her wound are down considerably and the wound bed looks healthy without any need for debridement. She is obtained her juxtalite stockings the left leg which has no open wound. We are waiting for the right leg wound to get smaller before ordering her one for the right leg 02/03/16; patient wound looked improved. No debridement was required. We've been using the juxtalite [name], [name] ([id number]) stocking on the left leg which is no open wound. 02/11/16 wound is remarkably better. No debridement was required. Healthy rim of epithelialization. We have been using Aquacel Ag, we'll try to close this down with RTD over the next 2 weeks 02/18/16 wound continues to improve down a centimeter in length and width. No debridement was required. Advancing epithelialization. We started RTD last week 02/25/16: pt brought juxtalite for right lower leg today. nurse reports RTD was dry and adhered to the wound. required removal by NS. wound bed with 100% healthy red granulation tissue. denies systemic s/s of infection. 03/02/16; Considerable improvement. no debridement. 03/09/16; wound bed is very vascular no major change in dimensions. Continuing with RTD 03/16/16; unfortunately no major change in dimensions however the wound bed looks very healthy. I will continue with RTD for one more week. If his stalls consider change to collagen. 03/30/2016 -- she's been using juxta lites on her left lower extremity but her lymphedema is very significantly increased and she is not tolerating the juxta lites  the edema has increased symptoms significantly 04/06/16; apparently the RTD started sticking to the wound therefore she was changed to sorbact. Her 4-layer wrap appears to have slipped one third of the way down her leg. She was tolerating the juxtalite on the left leg, I don't see the need to wrap the left leg. She is not a candidate for external compression pumps secondary to insurance issues. 04/13/16; wound is actually measuring larger. Has been using Sorbact 04/20/16; although the wound dimensions of really not changed all that much, the better this certainly looks healthy and there is advancing epithelialization from both sides. No evidence of infection 04/27/16 changed to [facility] last week and the dimensions appear to be a lot better. No evidence of infection. She does not put the juxtalite stocking on the left leg on correctly nevertheless she has never had an open wound here. I am really uncertain whether we ever got 2 to juxtalite stockings for her. The wound started as a blister 05/04/16; finally with Hydrofera Blue last week the area on the right leg has closed over. However in inspection of her left leg she now has a small wound on the posterior aspect of the left leg. She has been using juxta lites on this area but probably not really applying them correctly and with enough tension. She is not a candidate for compression pumps secondary to insurance issues 05/11/16; the area on her right anterior leg and closed over last week however we noted an area  on her left posterior calf. I elected to wrap both her legs in anticipation we be able to transition her to juxtalite stockings. She arrives today the wraps and fallen down on both sides. The area on the right anterior leg has reopened she also had a very tense blister on the right anterior lateral leg just above her wound which I elected to excise. The area on the posterior left leg is roughly about the same 05/18/16 patient presents  today for a follow-up concerning her right and left lower extremity venous stasis ulcers. Currently her wraps which are 4-layer compression have been sliding down due to the amount of swelling that she has. This is causing stasis above the wraps were really does not need to be and subsequently leading to her having blistering especially on the left above where the wrap is. In fact she has a large wound in this area that was just blistered last week and was not nearly as bad as what it is right now. Unfortunately despite what we have tried with the compression at this point I feel that we are almost being counterproductive in this respect. She tells me that her pain as a 9 out of 10 at this point in time. this pain is worse with manipulation of the wound including cleaning as well as just touching. 05/25/16 patient presents today for follow-up unfortunately her bilateral lower extremities actually significantly more edematous throughout. Last visit her wraps that actually slipped down which had been the course of things of the past several visits. For that reason we actually with light of compression to hopefully make this more uniform. Unfortunately the Kerlex and Coban compression at this point in time appears to have been suboptimal. she continues to have a significant amount of swelling in bilateral lower [name], [name] ([id number]) extremities and unfortunately had several new wounds which have surfaced of the right lower extremity at this point in time. Again venous in nature. She tells me that she does "elevate her legs" as much as she can. She notes that her legs felt tight and she relates this to be may be a 8 out of 10 at this point in time but is not having as much discomfort as last week. she is on Lasix currently 20 mg. 06/01/16; apparently over the last week or 2 the patient is developed a lot more edema in her right greater than left leg. This is resulted in substantial increase  in the wounds in the right leg to a lesser extent the left leg. For reasons that are unclear she is also had reduction in the wraps she is wearing down down to a 2 layer wrap. She has a appointment with her primary physician tomorrow, currently using 40 mg of Lasix a day. The patient has a history of lupus which apparently was systemic. She also has a history of hepatitis C. Somebody has done an ultrasound with a history of a PET hepatitis C elevated LFTs. This is labeled Hepatic Elastography,. Liver is noted to have fatty infiltration no focal abnormality. Her IVC had no abnormality visualized. She did not have ascites. Chronic medical renal disease. 06/11/2016 -- the patient was recently admitted to hospital for extensive lower extremity lymphedema and was worked up and discharged with Unna boots and these were extremely tight for her and she came in for an appointment today. She has been put on new medications as per her discharge summary. 06/15/16 patient presents for evaluation today concerning bilateral lower extremity  lymphedema and venous hypertension which is chronic. At this point in time she seems to be doing somewhat better than her the last evaluation. She tells me that she is not having significant discomfort at this time fortunately. she has no interval signs or symptoms of infection at this point and we have been using Aquacel Ag on her wounds currently. She is in a 4-layer compression at this point Electronic Signature(s) Signed: 06/16/2016 1:37:38 AM By: Lenda Kelp PA-C Entered By: Lenda Kelp on 06/15/2016 22:17:35 [name], [name] ([id number]) -------------------------------------------------------------------------------- Physical Exam Details Patient Name: Lewers, Ashia L. Date of Service: 06/15/2016 4:00 PM Medical Record Number: [id number] Patient Account Number: [id number] Date of Birth/Sex: 1956-05-28 (60 y.o. Female) Treating RN: Curtis Sites Primary  Care Physician: Sherrie Mustache Other Clinician: Referring Physician: Sherrie Mustache Treating Physician/Extender: STONE III, Nitesh Pitstick Weeks in Treatment: 25 Constitutional Well-nourished and well-hydrated in no acute distress. Respiratory normal breathing without difficulty. Cardiovascular 1+ pitting edema noted in the bilateral lower extremities. Psychiatric this patient is able to make decisions and demonstrates good insight into disease process. Alert and Oriented x 3. pleasant and cooperative. Notes This point the patient appears to be doing somewhat better currently in regard to her edema which is better controlled today. Fortunately she has no evidence of local or systemic infection at this point in time. overall the wounds appear to be fairly clean and again no sharp debridement is required at this point Electronic Signature(s) Signed: 06/16/2016 1:37:38 AM By: Lenda Kelp PA-C Entered By: Lenda Kelp on 06/15/2016 22:19:51 [name], [name] ([id number]) -------------------------------------------------------------------------------- Physician Orders Details Patient Name: [name], [name] Date of Service: 06/15/2016 4:00 PM Medical Record Number: [id number] Patient Account Number: [id number] Date of Birth/Sex: 05-15-1956 (60 y.o. Female) Treating RN: Curtis Sites Primary Care Physician: Sherrie Mustache Other Clinician: Referring Physician: Sherrie Mustache Treating Physician/Extender: Linwood Dibbles, Shelvia Fojtik Weeks in Treatment: 82 Verbal / Phone Orders: Yes Clinician: Curtis Sites Read Back and Verified: Yes Diagnosis Coding Wound Cleansing Wound #1 Right,Medial Lower Leg o Cleanse wound with mild soap and water Wound #2 Left,Posterior Lower Leg o Cleanse wound with mild soap and water Wound #3 Right,Proximal,Medial Lower Leg o Cleanse wound with mild soap and water Wound #6 Right,Anterior Lower Leg o Cleanse wound with mild soap and water Skin  Barriers/Peri-Wound Care Wound #1 Right,Medial Lower Leg o Moisturizing lotion Wound #2 Left,Posterior Lower Leg o Moisturizing lotion Wound #3 Right,Proximal,Medial Lower Leg o Moisturizing lotion Wound #6 Right,Anterior Lower Leg o Moisturizing lotion Primary Wound Dressing Wound #1 Right,Medial Lower Leg o Sorbalgon Ag Wound #2 Left,Posterior Lower Leg o Sorbalgon Ag Wound #3 Right,Proximal,Medial Lower Leg o Sorbalgon Ag Wound #6 Right,Anterior Lower Leg [name], [name] ([id number]) o Sorbalgon Ag Secondary Dressing Wound #1 Right,Medial Lower Leg o ABD pad - xtrasorb if needed for drainage Wound #2 Left,Posterior Lower Leg o ABD pad - xtrasorb if needed for drainage Wound #3 Right,Proximal,Medial Lower Leg o ABD pad - xtrasorb if needed for drainage Wound #6 Right,Anterior Lower Leg o ABD pad - xtrasorb if needed for drainage Dressing Change Frequency Wound #1 Right,Medial Lower Leg o Other: - twice weekly Wound #2 Left,Posterior Lower Leg o Other: - twice weekly Wound #3 Right,Proximal,Medial Lower Leg o Other: - twice weekly Wound #6 Right,Anterior Lower Leg o Other: - twice weekly Follow-up Appointments Wound #1 Right,Medial Lower Leg o Return Appointment in 1 week. o Nurse Visit as needed Wound #2 Left,Posterior Lower Leg o Return Appointment in 1 week.   o Nurse Visit as needed Wound #3 Right,Proximal,Medial Lower Leg o Return Appointment in 1 week. o Nurse Visit as needed Wound #6 Right,Anterior Lower Leg o Return Appointment in 1 week. o Nurse Visit as needed Edema Control Wound #1 Right,Medial Lower Leg o 4 Layer Compression System - Bilateral [name], [name] ([id number]) Wound #2 Left,Posterior Lower Leg o 4 Layer Compression System - Bilateral Wound #3 Right,Proximal,Medial Lower Leg o 4 Layer Compression System - Bilateral Wound #6 Right,Anterior Lower Leg o 4 Layer Compression  System - Bilateral Additional Orders / Instructions Wound #1 Right,Medial Lower Leg o Increase protein intake. Wound #2 Left,Posterior Lower Leg o Increase protein intake. Wound #3 Right,Proximal,Medial Lower Leg o Increase protein intake. Wound #6 Right,Anterior Lower Leg o Increase protein intake. Electronic Signature(s) Signed: 06/15/2016 5:11:35 PM By: Curtis Sites Signed: 06/16/2016 1:37:38 AM By: Lenda Kelp PA-C Entered By: Curtis Sites on 06/15/2016 16:31:20 Gupton, [name] ([id number]) -------------------------------------------------------------------------------- Problem List Details Patient Name: Reaume, Kamariya L. Date of Service: 06/15/2016 4:00 PM Medical Record Number: [id number] Patient Account Number: [id number] Date of Birth/Sex: Aug 20, 1955 (60 y.o. Female) Treating RN: Curtis Sites Primary Care Physician: Sherrie Mustache Other Clinician: Referring Physician: Sherrie Mustache Treating Physician/Extender: Skeet Simmer in Treatment: 25 Active Problems ICD-10 Encounter Code Description Active Date Diagnosis I87.331 Chronic venous hypertension (idiopathic) with ulcer and 12/23/2015 Yes inflammation of right lower extremity I87.322 Chronic venous hypertension (idiopathic) with 12/23/2015 Yes inflammation of left lower extremity L97.211 Non-pressure chronic ulcer of right calf limited to 03/02/2016 Yes breakdown of skin I89.0 Lymphedema, not elsewhere classified 06/01/2016 Yes M32.10 Systemic lupus erythematosus, organ or system 12/23/2015 Yes involvement unspecified Inactive Problems Resolved Problems Electronic Signature(s) Signed: 06/16/2016 1:37:38 AM By: Lenda Kelp PA-C Entered By: Lenda Kelp on 06/15/2016 22:14:25 Boedecker, [place] Elbert Ewings ([id number]) -------------------------------------------------------------------------------- Progress Note Details Patient Name: Alldredge, Eliana L. Date of Service: 06/15/2016 4:00  PM Medical Record Number: [id number] Patient Account Number: [id number] Date of Birth/Sex: 05/24/1956 (60 y.o. Female) Treating RN: Curtis Sites Primary Care Physician: Sherrie Mustache Other Clinician: Referring Physician: Sherrie Mustache Treating Physician/Extender: Linwood Dibbles, Braelon Sprung Weeks in Treatment: 25 Subjective Chief Complaint Information obtained from Patient Patient is here for follow-up regarding her bilateral lower extremity venous stasis ulcers History of Present Illness (HPI) 12/23/15; this is a 60 year old lady who recently relocated back to [place] from [place]. She is staying with a family friend previously lived with a daughter in [place]. 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 She is on chronic prednisone she is not a diabetic. Dose of prednisone is 10 mg. The patient tells me that roughly a week ago she developed a fairly substantial blister over this area that then ruptured. She was seen in the emergency room on 5/7 an x-ray of the leg was negative she was put on Septra although I don't think any cultures were done. She also tells me she has had chronic edema in her legs for a number of years. She had a similar presentation to currently in the right lateral lower leg roughly a year ago that she was able to heal on her own. As noted she has systemic lupus but does not have lupus nephritis according to the patient. She has a lot of edema in her lower legs. The ABI's could not be obtained. 12/31/15; the patient's insurance which is [place] [employer] makes her out of network for any home health therefore we have  been changing her dressing in our facility. I reviewed her trip to the ER on 12/21/2015 her creatinine was within the normal range hemoglobin and white count normal. Culture of the wound was negative. X-ray of the leg showed soft tissue edema no foreign bodies. We have been dressing this with Aquacel Ag  due to the amount of ongoing drainage 01/07/16; the patient had her arterial studies this morning I don't have these results area she comes back in to have [place] rewrap since she doesn't have access to home health 01/14/16 I still do not have the results of her arterial studies. Our nurse correctly pointed out that we've been wrapping the left leg without an open wound I think this had to do with the fact that she had so much edema when she came in I was concerned about leaving this unwrapped. 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 We left her left leg unwrapped last week as she has no open wound here today she has extensive edema here. We did order stockings however I believe the company called but they have not called them back. She comes back in today with extensive edema in the left leg. We have her arterial studies which showed triphasic waves and all locations tested including her posterior tibial and anterior tibial arteries bilaterally. Report listed no hemodynamically significant bilateral lower extremity arterial stenosis. She has her venous studies later this month. I'm going to rewrap the left leg due to the extent of the edema, transitioning her into the stocking we ordered last week as soon as one is available AMITY, SCHIEK. ([id number]) 01/27/16 the patient arrived today with pooled serosanguineous drainage over the wound it would not absorb into her Aquacel Ag which is somewhat on. In spite of this the dimensions of her wound are down considerably and the wound bed looks healthy without any need for debridement. She is obtained her juxtalite stockings the left leg which has no open wound. We are waiting for the right leg wound to get smaller before ordering her one for the right leg 02/03/16; patient wound looked improved. No debridement was required.  We've been using the juxtalite stocking on the left leg which is no open wound. 02/11/16 wound is remarkably better. No debridement was required. Healthy rim of epithelialization. We have been using Aquacel Ag, we'll try to close this down with RTD over the next 2 weeks 02/18/16 wound continues to improve down a centimeter in length and width. No debridement was required. Advancing epithelialization. We started RTD last week 02/25/16: pt brought juxtalite for right lower leg today. nurse reports RTD was dry and adhered to the wound. required removal by NS. wound bed with 100% healthy red granulation tissue. denies systemic s/s of infection. 03/02/16; Considerable improvement. no debridement. 03/09/16; wound bed is very vascular no major change in dimensions. Continuing with RTD 03/16/16; unfortunately no major change in dimensions however the wound bed looks very healthy. I will continue with RTD for one more week. If his stalls consider change to collagen. 03/30/2016 -- she's been using juxta lites on her left lower extremity but her lymphedema is very significantly increased and she is not tolerating the juxta lites the edema has increased symptoms significantly 04/06/16; apparently the RTD started sticking to the wound therefore she was changed to sorbact. Her 4-layer wrap appears to have slipped one third  of the way down her leg. She was tolerating the juxtalite on the left leg, I don't see the need to wrap the left leg. She is not a candidate for external compression pumps secondary to insurance issues. 04/13/16; wound is actually measuring larger. Has been using Sorbact 04/20/16; although the wound dimensions of really not changed all that much, the better this certainly looks healthy and there is advancing epithelialization from both sides. No evidence of infection 04/27/16 changed to [facility] last week and the dimensions appear to be a lot better. No evidence of infection. She does not put  the juxtalite stocking on the left leg on correctly nevertheless she has never had an open wound here. I am really uncertain whether we ever got 2 to juxtalite stockings for her. The wound started as a blister 05/04/16; finally with Hydrofera Blue last week the area on the right leg has closed over. However in inspection of her left leg she now has a small wound on the posterior aspect of the left leg. She has been using juxta lites on this area but probably not really applying them correctly and with enough tension. She is not a candidate for compression pumps secondary to insurance issues 05/11/16; the area on her right anterior leg and closed over last week however we noted an area on her left posterior calf. I elected to wrap both her legs in anticipation we be able to transition her to juxtalite stockings. She arrives today the wraps and fallen down on both sides. The area on the right anterior leg has reopened she also had a very tense blister on the right anterior lateral leg just above her wound which I elected to excise. The area on the posterior left leg is roughly about the same 05/18/16 patient presents today for a follow-up concerning her right and left lower extremity venous stasis ulcers. Currently her wraps which are 4-layer compression have been sliding down due to the amount of swelling that she has. This is causing stasis above the wraps were really does not need to be and subsequently leading to her having blistering especially on the left above where the wrap is. In fact she has a large wound in this area that was just blistered last week and was not nearly as bad as what it is right now. Unfortunately despite what we have tried with the compression at this point I feel that we are almost being counterproductive in this respect. She tells me that her pain as a 9 out of 10 at this point in time. this pain is worse with manipulation of the wound including cleaning as well as just  touching. [name], [name] ([id number]) 05/25/16 patient presents today for follow-up unfortunately her bilateral lower extremities actually significantly more edematous throughout. Last visit her wraps that actually slipped down which had been the course of things of the past several visits. For that reason we actually with light of compression to hopefully make this more uniform. Unfortunately the Kerlex and Coban compression at this point in time appears to have been suboptimal. she continues to have a significant amount of swelling in bilateral lower extremities and unfortunately had several new wounds which have surfaced of the right lower extremity at this point in time. Again venous in nature. She tells me that she does "elevate her legs" as much as she can. She notes that her legs felt tight and she relates this to be may be a 8 out of 10 at this  point in time but is not having as much discomfort as last week. she is on Lasix currently 20 mg. 06/01/16; apparently over the last week or 2 the patient is developed a lot more edema in her right greater than left leg. This is resulted in substantial increase in the wounds in the right leg to a lesser extent the left leg. For reasons that are unclear she is also had reduction in the wraps she is wearing down down to a 2 layer wrap. She has a appointment with her primary physician tomorrow, currently using 40 mg of Lasix a day. The patient has a history of lupus which apparently was systemic. She also has a history of hepatitis C. Somebody has done an ultrasound with a history of a PET hepatitis C elevated LFTs. This is labeled Hepatic Elastography,. Liver is noted to have fatty infiltration no focal abnormality. Her IVC had no abnormality visualized. She did not have ascites. Chronic medical renal disease. 06/11/2016 -- the patient was recently admitted to hospital for extensive lower extremity lymphedema and was worked up and discharged with  Unna boots and these were extremely tight for her and she came in for an appointment today. She has been put on new medications as per her discharge summary. 06/15/16 patient presents for evaluation today concerning bilateral lower extremity lymphedema and venous hypertension which is chronic. At this point in time she seems to be doing somewhat better than her the last evaluation. She tells me that she is not having significant discomfort at this time fortunately. she has no interval signs or symptoms of infection at this point and we have been using Aquacel Ag on her wounds currently. She is in a 4-layer compression at this point Objective Constitutional Well-nourished and well-hydrated in no acute distress. Vitals Time Taken: 4:04 PM, Height: 62 in, Weight: 170 lbs, BMI: 31.1, Temperature: 98.4 F, Pulse: 94 bpm, Respiratory Rate: 20 breaths/min, Blood Pressure: 138/43 mmHg. Respiratory normal breathing without difficulty. Cardiovascular 1+ pitting edema noted in the bilateral lower extremities. Psychiatric [name], [name] ([id number]) this patient is able to make decisions and demonstrates good insight into disease process. Alert and Oriented x 3. pleasant and cooperative. General Notes: This point the patient appears to be doing somewhat better currently in regard to her edema which is better controlled today. Fortunately she has no evidence of local or systemic infection at this point in time. overall the wounds appear to be fairly clean and again no sharp debridement is required at this point Integumentary (Hair, Skin) Wound #1 status is Open. Original cause of wound was Blister. The wound is located on the Right,Medial Lower Leg. The wound measures 1.6cm length x 0.8cm width x 0.1cm depth; 1.005cm^2 area and 0.101cm^3 volume. The wound is limited to skin breakdown. There is no tunneling or undermining noted. There is a large amount of serosanguineous drainage noted. The wound  margin is flat and intact. There is large (67-100%) red granulation within the wound bed. There is no necrotic tissue within the wound bed. The periwound skin appearance did not exhibit: Callus, Crepitus, Excoriation, Fluctuance, Friable, Induration, Localized Edema, Rash, Scarring, Dry/Scaly, Maceration, Moist, Atrophie Blanche, Cyanosis, Ecchymosis, Hemosiderin Staining, Mottled, Pallor, Rubor, Erythema. The periwound has tenderness on palpation. Wound #2 status is Open. Original cause of wound was Gradually Appeared. The wound is located on the Left,Posterior Lower Leg. The wound measures 1cm length x 0.4cm width x 0.1cm depth; 0.314cm^2 area and 0.031cm^3 volume. The wound is limited to skin  breakdown. There is no tunneling or undermining noted. There is a large amount of serosanguineous drainage noted. The wound margin is flat and intact. There is large (67-100%) red granulation within the wound bed. There is no necrotic tissue within the wound bed. The periwound skin appearance exhibited: Erythema. The periwound skin appearance did not exhibit: Callus, Crepitus, Excoriation, Fluctuance, Friable, Induration, Localized Edema, Rash, Scarring, Dry/Scaly, Maceration, Moist, Atrophie Blanche, Cyanosis, Ecchymosis, Hemosiderin Staining, Mottled, Pallor, Rubor. The surrounding wound skin color is noted with erythema which is circumferential. The periwound has tenderness on palpation. Wound #3 status is Open. Original cause of wound was Blister. The wound is located on the Right,Proximal,Medial Lower Leg. The wound measures 0.5cm length x 1cm width x 0.1cm depth; 0.393cm^2 area and 0.039cm^3 volume. The wound is limited to skin breakdown. There is no tunneling or undermining noted. There is a large amount of serosanguineous drainage noted. The wound margin is flat and intact. There is large (67-100%) red granulation within the wound bed. There is no necrotic tissue within the wound bed. The  periwound skin appearance exhibited: Erythema. The periwound skin appearance did not exhibit: Callus, Crepitus, Excoriation, Fluctuance, Friable, Induration, Localized Edema, Rash, Scarring, Dry/Scaly, Maceration, Moist, Atrophie Blanche, Cyanosis, Ecchymosis, Hemosiderin Staining, Mottled, Pallor, Rubor. The surrounding wound skin color is noted with erythema which is circumferential. The periwound has tenderness on palpation. Wound #4 status is Healed - Epithelialized. Original cause of wound was Gradually Appeared. The wound is located on the Right,Posterior Lower Leg. The wound measures 0cm length x 0cm width x 0cm depth; 0cm^2 area and 0cm^3 volume. There is no tunneling or undermining noted. There is a none present amount of drainage noted. There is no granulation within the wound bed. There is no necrotic tissue within the wound bed. Wound #6 status is Open. Original cause of wound was Gradually Appeared. The wound is located on the Right,Anterior Lower Leg. The wound measures 0.6cm length x 1cm width x 0.1cm depth; 0.471cm^2 area and 0.047cm^3 volume. There is no tunneling or undermining noted. There is a large amount of Gargus, Myosha L. ([id number]) serosanguineous drainage noted. The wound margin is flat and intact. There is large (67-100%) red granulation within the wound bed. There is no necrotic tissue within the wound bed. The periwound skin appearance exhibited: Erythema. The periwound skin appearance did not exhibit: Callus, Crepitus, Excoriation, Fluctuance, Friable, Induration, Localized Edema, Rash, Scarring, Dry/Scaly, Maceration, Moist, Atrophie Blanche, Cyanosis, Ecchymosis, Hemosiderin Staining, Mottled, Pallor, Rubor. The surrounding wound skin color is noted with erythema which is circumferential. The periwound has tenderness on palpation. Assessment Active Problems ICD-10 I87.331 - 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 lower  extremity I87.322 - Chronic venous hypertension (idiopathic) with inflammation of left lower extremity L97.211 - Non-pressure chronic ulcer of right calf limited to breakdown of skin I89.0 - Lymphedema, not elsewhere classified M32.10 - Systemic lupus erythematosus, organ or system involvement unspecified Diagnoses ICD-10 I87.331: 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 lower extremity I87.322: Chronic venous hypertension (idiopathic) with inflammation of left lower extremity L97.211: Non-pressure chronic ulcer of right calf limited to breakdown of skin I89.0: Lymphedema, not elsewhere classified M32.10: Systemic lupus erythematosus, organ or system involvement unspecified Plan Wound Cleansing: Wound #1 Right,Medial Lower Leg: Cleanse wound with mild soap and water Wound #2 Left,Posterior Lower Leg: Cleanse wound with mild soap and water Wound #3 Right,Proximal,Medial Lower Leg: Cleanse wound with mild soap and water Wound #6 Right,Anterior Lower Leg: Cleanse  wound with mild soap and water Skin Barriers/Peri-Wound Care: Wound #1 Right,Medial Lower Leg: Moisturizing lotion Wound #2 Left,Posterior Lower Leg: MARYBELLE, GIRALDO. ([id number]) Moisturizing lotion Wound #3 Right,Proximal,Medial Lower Leg: Moisturizing lotion Wound #6 Right,Anterior Lower Leg: Moisturizing lotion Primary Wound Dressing: Wound #1 Right,Medial Lower Leg: Sorbalgon Ag Wound #2 Left,Posterior Lower Leg: Sorbalgon Ag Wound #3 Right,Proximal,Medial Lower Leg: Sorbal[place] Ag Wound #6 Right,Anterior Lower Leg: [place] Ag Secondary Dressing: Wound #1 Right,Medial Lower Leg: ABD pad - xtrasorb if needed for drainage Wound #2 Left,Posterior Lower Leg: ABD pad - xtrasorb if needed for drainage Wound #3 Right,Proximal,Medial Lower Leg: ABD pad - xtrasorb if needed for drainage Wound #6 Right,Anterior Lower Leg: ABD pad - xtrasorb if needed for drainage Dressing  Change Frequency: Wound #1 Right,Medial Lower Leg: Other: - twice weekly Wound #2 Left,Posterior Lower Leg: Other: - twice weekly Wound #3 Right,Proximal,Medial Lower Leg: Other: - twice weekly Wound #6 Right,Anterior Lower Leg: Other: - twice weekly Follow-up Appointments: Wound #1 Right,Medial Lower Leg: Return Appointment in 1 week. Nurse Visit as needed Wound #2 Left,Posterior Lower Leg: Return Appointment in 1 week. Nurse Visit as needed Wound #3 Right,Proximal,Medial Lower Leg: Return Appointment in 1 week. Nurse Visit as needed Wound #6 Right,Anterior Lower Leg: Return Appointment in 1 week. Nurse Visit as needed Edema Control: Wound #1 Right,Medial Lower Leg: 4 Layer Compression System - Bilateral Wound #2 Left,Posterior Lower Leg: 4 Layer Compression System - Bilateral Wound #3 Right,Proximal,Medial Lower Leg: Foye, [name] ([id number]) 4 Layer Compression System - Bilateral Wound #6 Right,Anterior Lower Leg: 4 Layer Compression System - Bilateral Additional Orders / Instructions: Wound #1 Right,Medial Lower Leg: Increase protein intake. Wound #2 Left,Posterior Lower Leg: Increase protein intake. Wound #3 Right,Proximal,Medial Lower Leg: Increase protein intake. Wound #6 Right,Anterior Lower Leg: Increase protein intake. Follow-Up Appointments: A follow-up appointment should be scheduled. A Patient Clinical Summary of Care was provided to [facility] Patient did not require debridement today. She seems to be doing somewhat better in regard to the bilateral lower extremity edema at this point in time. I'm do recommend that we continue with the silver alginate dressings and we will reevaluate in one week. We will also continue his 4-layer compression wrap that was put in place by Dr. Leanord Hawking.otherwise I feel that she is doing well this point in time today. All questions were encouraged and answered to the best my ability during the office visit today. I will see  her back for follow-up in one week. Electronic Signature(s) Signed: 06/16/2016 1:37:38 AM By: Lenda Kelp PA-C Entered By: Lenda Kelp on 06/15/2016 22:55:02 Olesky, [name] ([id number]) -------------------------------------------------------------------------------- SuperBill Details Patient Name: Woerner, Ermalinda L. Date of Service: 06/15/2016 Medical Record Number: [id number] Patient Account Number: [id number] Date of Birth/Sex: [birth year] (60 y.o. Female) Treating RN: Curtis Sites Primary Care Physician: Sherrie Mustache Other Clinician: Referring Physician: Sherrie Mustache Treating Physician/Extender: Linwood Dibbles, Conchetta [name] Weeks in Treatment: 25 Diagnosis Coding ICD-10 Codes Code Description 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 lower I87.331 extremity I87.322 Chronic venous hypertension (idiopathic) with inflammation of left lower extremity L97.211 Non-pressure chronic ulcer of right calf limited to breakdown of skin I89.0 Lymphedema, not elsewhere classified M32.10 Systemic lupus erythematosus, organ or system involvement unspecified Facility Procedures CPT4: Description Modifier Quantity Code [id number] BILATERAL: Application of multi-layer venous compression 1 system; leg (below knee), including ankle and foot. Physician Procedures CPT4: Description Modifier Quantity Code [id number] - WC PHYS LEVEL 3 - EST PT 1 ICD-10  Description Diagnosis I87.331 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 lower extremity I87.322 Chronic venous hypertension  (idiopathic) with inflammation of left lower extremity L97.211 Non-pressure chronic ulcer of right calf limited to breakdown of skin I89.0 Lymphedema, not elsewhere classified Electronic Signature(s) Signed: 06/16/2016 1:37:38 AM By: Lenda KelpStone III, Jeanita Carneiro PA-C Previous Signature: 06/15/2016 5:10:08 PM Version By: Curtis Sitesorthy, Joanna Entered By: Lenda KelpStone III, Styles Fambro on  06/15/2016 22:20:42

## 2016-06-18 ENCOUNTER — Encounter: Payer: Medicare Other | Attending: Surgery

## 2016-06-18 DIAGNOSIS — B1920 Unspecified viral hepatitis C without hepatic coma: Secondary | ICD-10-CM | POA: Diagnosis not present

## 2016-06-18 DIAGNOSIS — I87333 Chronic venous hypertension (idiopathic) with ulcer and inflammation of bilateral lower extremity: Secondary | ICD-10-CM | POA: Insufficient documentation

## 2016-06-18 DIAGNOSIS — L97211 Non-pressure chronic ulcer of right calf limited to breakdown of skin: Secondary | ICD-10-CM | POA: Insufficient documentation

## 2016-06-18 DIAGNOSIS — I890 Lymphedema, not elsewhere classified: Secondary | ICD-10-CM | POA: Diagnosis not present

## 2016-06-18 DIAGNOSIS — L97221 Non-pressure chronic ulcer of left calf limited to breakdown of skin: Secondary | ICD-10-CM | POA: Diagnosis not present

## 2016-06-18 DIAGNOSIS — M3210 Systemic lupus erythematosus, organ or system involvement unspecified: Secondary | ICD-10-CM | POA: Insufficient documentation

## 2016-06-18 DIAGNOSIS — Z8673 Personal history of transient ischemic attack (TIA), and cerebral infarction without residual deficits: Secondary | ICD-10-CM | POA: Insufficient documentation

## 2016-06-18 DIAGNOSIS — Z7952 Long term (current) use of systemic steroids: Secondary | ICD-10-CM | POA: Diagnosis not present

## 2016-06-18 DIAGNOSIS — I10 Essential (primary) hypertension: Secondary | ICD-10-CM | POA: Insufficient documentation

## 2016-06-19 NOTE — Progress Notes (Signed)
[name], [name] ([id number]) Visit Report for 06/18/2016 Arrival Information Details Patient Name: [name], [name] Date of Service: 06/18/2016 10:45 AM Medical Record Number: [id number] Patient Account Number: [id number] Date of Birth/Sex: 03-01-1956 (60 y.o. Female) Treating RN: Huel Coventry Primary Care Physician: Sherrie Mustache Other Clinician: Referring Physician: Sherrie Mustache Treating Physician/Extender: Rudene Re in Treatment: 25 Visit Information History Since Last Visit Added or deleted any medications: No Patient Arrived: Walker Any new allergies or adverse reactions: No Arrival Time: 11:06 Had a fall or experienced change in No Accompanied By: caregiver activities of daily living that may affect Transfer Assistance: None risk of falls: Patient Identification Verified: Yes Signs or symptoms of abuse/neglect since last No Secondary Verification Process Yes visito Completed: Hospitalized since last visit: No Patient Requires Transmission-Based No Has Dressing in Place as Prescribed: Yes Precautions: Has Compression in Place as Prescribed: Yes Patient Has Alerts: No Pain Present Now: Yes Electronic Signature(s) Signed: 06/18/2016 3:24:19 PM By: Elliot Gurney, RN, [name], [name], [name] Entered By: Elliot Gurney, RN, [name], Kim on 06/18/2016 11:07:46 Rod, [name] ([id number]) -------------------------------------------------------------------------------- Compression Therapy Details Patient Name: [name], [name] Date of Service: 06/18/2016 10:45 AM Medical Record Number: [id number] Patient Account Number: [id number] Date of Birth/Sex: [birth year] (60 y.o. Female) Treating RN: Huel Coventry Primary Care Physician: Sherrie Mustache Other Clinician: Referring Physician: Sherrie Mustache Treating Physician/Extender: Rudene Re in Treatment: 25 Compression Therapy Performed for Wound Wound #3 Right,Proximal,Medial Lower Leg Assessment: Performed By: Clinician Huel Coventry, RN Compression Type: Four Layer Pre Treatment ABI: 1.3 Electronic Signature(s) Signed: 06/18/2016 3:24:19 PM By: Elliot Gurney, RN, [name], [name], [name] Entered By: Elliot Gurney, RN, [name], Kim on 06/18/2016 11:37:19 Lautner, [name] ([id number]) -------------------------------------------------------------------------------- Compression Therapy Details Patient Name: [name], [name] Date of Service: 06/18/2016 10:45 AM Medical Record Number: [id number] Patient Account Number: [id number] Date of Birth/Sex: Dec 30, 1955 (60 y.o. Female) Treating RN: Huel Coventry Primary Care Physician: Sherrie Mustache Other Clinician: Referring Physician: Sherrie Mustache Treating Physician/Extender: Rudene Re in Treatment: 25 Compression Therapy Performed for Wound Wound #1 Right,Medial Lower Leg Assessment: Performed By: Clinician Huel Coventry, RN Compression Type: Four Layer Pre Treatment ABI: 1.3 Electronic Signature(s) Signed: 06/18/2016 3:24:19 PM By: Elliot Gurney, RN, [name], [name], [name] Entered By: Elliot Gurney, RN, [name], Kim on 06/18/2016 11:37:19 Bucholz, [name] ([id number]) -------------------------------------------------------------------------------- Compression Therapy Details Patient Name: [name], [name] Date of Service: 06/18/2016 10:45 AM Medical Record Number: [id number] Patient Account Number: [id number] Date of Birth/Sex: [birth year] (60 y.o. Female) Treating RN: Huel Coventry Primary Care Physician: Sherrie Mustache Other Clinician: Referring Physician: Sherrie Mustache Treating Physician/Extender: Rudene Re in Treatment: 25 Compression Therapy Performed for Wound Wound #2 Left,Posterior Lower Leg Assessment: Performed By: Clinician Huel Coventry, RN Compression Type: Four Layer Pre Treatment ABI: 1.3 Electronic Signature(s) Signed: 06/18/2016 3:24:19 PM By: Elliot Gurney, RN, [name], [name], [name] Entered By: Elliot Gurney, RN, [name], Kim on 06/18/2016 11:37:20 Acheampong, [name]  ([id number]) -------------------------------------------------------------------------------- Compression Therapy Details Patient Name: [name], [name] Date of Service: 06/18/2016 10:45 AM Medical Record Number: [id number] Patient Account Number: [id number] Date of Birth/Sex: [birth year] (60 y.o. Female) Treating RN: Huel Coventry Primary Care Physician: Sherrie Mustache Other Clinician: Referring Physician: Sherrie Mustache Treating Physician/Extender: Rudene Re in Treatment: 25 Compression Therapy Performed for Wound Wound #6 Right,Anterior Lower Leg Assessment: Performed By: Clinician Huel Coventry, RN Compression Type: Four Layer Pre Treatment ABI: 1.3 Electronic Signature(s) Signed: 06/18/2016 3:24:19 PM By: Elliot Gurney, RN, [name], [name], [name] Entered By: Elliot Gurney, RN, [name], Kim on 06/18/2016 11:37:20 Lupe,  Collette LMarland Kitchen ([id number]) -------------------------------------------------------------------------------- Encounter Discharge Information Details Patient Name: [name], [name] Date of Service: 06/18/2016 10:45 AM Medical Record Number: [id number] Patient Account Number: [id number] Date of Birth/Sex: 06-04-1956 (60 y.o. Female) Treating RN: Huel Coventry Primary Care Physician: Sherrie Mustache Other Clinician: Referring Physician: Sherrie Mustache Treating Physician/Extender: Rudene Re in Treatment: 25 Encounter Discharge Information Items Discharge Pain Level: 2 Discharge Condition: Stable Ambulatory Status: Walker Discharge Destination: Home Private Transportation: Auto Accompanied By: friend Schedule Follow-up Appointment: Yes Medication Reconciliation completed and Yes provided to Patient/Care Charlene Cowdrey: Clinical Summary of Care: Electronic Signature(s) Signed: 06/18/2016 3:24:19 PM By: Elliot Gurney, RN, [name], [name], [name] Entered By: Elliot Gurney, RN, [name], Kim on 06/18/2016 11:38:17 Gatchell, [name]  ([id number]) -------------------------------------------------------------------------------- Patient/Caregiver Education Details Patient Name: [name] Date of Service: 06/18/2016 10:45 AM Medical Record Number: [id number] Patient Account Number: [id number] Date of Birth/Gender: 1955-11-11 (60 y.o. Female) Treating RN: Huel Coventry Primary Care Physician: Sherrie Mustache Other Clinician: Referring Physician: Sherrie Mustache Treating Physician/Extender: Rudene Re in Treatment: 25 Education Assessment Education Provided To: Patient Education Topics Provided Venous: Handouts: Controlling Swelling with Compression Stockings Methods: Explain/Verbal Responses: State content correctly Electronic Signature(s) Signed: 06/18/2016 3:24:19 PM By: Elliot Gurney, RN, [name], [name], [name] Entered By: Elliot Gurney, RN, [name], Kim on 06/18/2016 11:37:58 Surgeon, [name] ([id number]) -------------------------------------------------------------------------------- Wound Assessment Details Patient Name: Konitzer, Apphia L. Date of Service: 06/18/2016 10:45 AM Medical Record Number: [id number] Patient Account Number: [id number] Date of Birth/Sex: [birth year] (60 y.o. Female) Treating RN: Huel Coventry Primary Care Physician: Sherrie Mustache Other Clinician: Referring Physician: Sherrie Mustache Treating Physician/Extender: Rudene Re in Treatment: 25 Wound Status Wound Number: 1 Primary Etiology: Venous Leg Ulcer Wound Location: Right, Medial Lower Leg Wound Status: Open Wounding Event: Blister Date Acquired: 12/15/2015 Weeks Of Treatment: 25 Clustered Wound: No Wound Measurements Length: (cm) 1 Width: (cm) 0.6 Depth: (cm) 0.1 Area: (cm) 0.471 Volume: (cm) 0.047 % Reduction in Area: 98.2% % Reduction in Volume: 98.2% Wound Description Classification: Partial Thickness Periwound Skin Texture Texture Color No Abnormalities Noted: No No Abnormalities Noted: No Moisture No Abnormalities  Noted: No Treatment Notes Wound #1 (Right, Medial Lower Leg) 1. Cleansed with: Cleanse wound with antibacterial soap and water 3. Peri-wound Care: Moisturizing lotion 4. Dressing Applied: Other dressing (specify in notes) 5. Secondary Dressing Applied ABD Pad 7. Secured with 4 Layer Compression System - Bilateral Notes [place] AUREA, ARONOV ([id number]) Electronic Signature(s) Signed: 06/18/2016 3:24:19 PM By: Elliot Gurney, RN, [name], [name], [name] Entered By: Elliot Gurney, RN, [name], Kim on 06/18/2016 11:36:44 Nasworthy, [name] ([id number]) -------------------------------------------------------------------------------- Wound Assessment Details Patient Name: Urista, Rajvi L. Date of Service: 06/18/2016 10:45 AM Medical Record Number: [id number] Patient Account Number: [id number] Date of Birth/Sex: [birth year] (60 y.o. Female) Treating RN: Huel Coventry Primary Care Physician: Sherrie Mustache Other Clinician: Referring Physician: Sherrie Mustache Treating Physician/Extender: Rudene Re in Treatment: 25 Wound Status Wound Number: 2 Primary Etiology: Lymphedema Wound Location: Left, Posterior Lower Leg Wound Status: Open Wounding Event: Gradually Appeared Date Acquired: 04/19/2016 Weeks Of Treatment: 6 Clustered Wound: Yes Wound Measurements Length: (cm) 1 Width: (cm) 0.3 Depth: (cm) 0.1 Area: (cm) 0.236 Volume: (cm) 0.024 % Reduction in Area: 72.2% % Reduction in Volume: 71.8% Wound Description Classification: Partial Thickness Periwound Skin Texture Texture Color No Abnormalities Noted: No No Abnormalities Noted: No Moisture No Abnormalities Noted: No Treatment Notes Wound #2 (Left, Posterior Lower Leg) 1. Cleansed with: Cleanse wound with antibacterial soap and water 3. Peri-wound Care: Moisturizing lotion 4. Dressing Applied:  Other dressing (specify in notes) 5. Secondary Dressing Applied ABD Pad 7. Secured with 4 Layer Compression System -  Bilateral Notes [place] MARTHELLA, OSORNO ([id number]) Electronic Signature(s) Signed: 06/18/2016 3:24:19 PM By: Elliot Gurney, RN, [name], [name], [name] Entered By: Elliot Gurney, RN, [name], Kim on 06/18/2016 11:36:44 Barkett, [name] ([id number]) -------------------------------------------------------------------------------- Wound Assessment Details Patient Name: Bussiere, Janeal L. Date of Service: 06/18/2016 10:45 AM Medical Record Number: [id number] Patient Account Number: [id number] Date of Birth/Sex: [birth year] (60 y.o. Female) Treating RN: Huel Coventry Primary Care Physician: Sherrie Mustache Other Clinician: Referring Physician: Sherrie Mustache Treating Physician/Extender: Rudene Re in Treatment: 25 Wound Status Wound Number: 3 Primary Etiology: Venous Leg Ulcer Wound Location: Right, Proximal, Medial Lower Wound Status: Open Leg Wounding Event: Blister Date Acquired: 05/18/2016 Weeks Of Treatment: 4 Clustered Wound: No Wound Measurements Length: (cm) 0.5 Width: (cm) 0.8 Depth: (cm) 0.1 Area: (cm) 0.314 Volume: (cm) 0.031 % Reduction in Area: 98.5% % Reduction in Volume: 99.3% Wound Description Full Thickness Without Exposed Classification: Support Structures Periwound Skin Texture Texture Color No Abnormalities Noted: No No Abnormalities Noted: No Moisture No Abnormalities Noted: No Treatment Notes Wound #3 (Right, Proximal, Medial Lower Leg) 1. Cleansed with: Cleanse wound with antibacterial soap and water 3. Peri-wound Care: Moisturizing lotion 4. Dressing Applied: Other dressing (specify in notes) 5. Secondary Dressing Applied ABD Pad 7. Secured with 4 Layer Compression System - Bilateral CORYN, MOSSO ([id number]) Notes [place] Electronic Signature(s) Signed: 06/18/2016 3:24:19 PM By: Elliot Gurney, RN, [name], [name], [name] Entered By: Elliot Gurney, RN, [name], Kim on 06/18/2016 11:36:44 Alderete, [name]  ([id number]) -------------------------------------------------------------------------------- Wound Assessment Details Patient Name: Lamphier, Imanie L. Date of Service: 06/18/2016 10:45 AM Medical Record Number: [id number] Patient Account Number: [id number] Date of Birth/Sex: [birth year] (60 y.o. Female) Treating RN: Huel Coventry Primary Care Physician: Sherrie Mustache Other Clinician: Referring Physician: Sherrie Mustache Treating Physician/Extender: Rudene Re in Treatment: 25 Wound Status Wound Number: 6 Primary Etiology: Venous Leg Ulcer Wound Location: Right, Anterior Lower Leg Wound Status: Open Wounding Event: Gradually Appeared Date Acquired: 06/11/2016 Weeks Of Treatment: 1 Clustered Wound: No Wound Measurements Length: (cm) 0.4 Width: (cm) 0.8 Depth: (cm) 0.1 Area: (cm) 0.251 Volume: (cm) 0.025 % Reduction in Area: 76.3% % Reduction in Volume: 76.4% Wound Description Classification: Partial Thickness Periwound Skin Texture Texture Color No Abnormalities Noted: No No Abnormalities Noted: No Moisture No Abnormalities Noted: No Treatment Notes Wound #6 (Right, Anterior Lower Leg) 1. Cleansed with: Cleanse wound with antibacterial soap and water 3. Peri-wound Care: Moisturizing lotion 4. Dressing Applied: Other dressing (specify in notes) 5. Secondary Dressing Applied ABD Pad 7. Secured with 4 Layer Compression System - Bilateral Notes [place] FLORIE, CARICO ([id number]) Electronic Signature(s) Signed: 06/18/2016 3:24:19 PM By: Elliot Gurney, RN, [name], [name], [name] Entered By: Elliot Gurney, RN, [name], Kim on 06/18/2016 11:36:45

## 2016-06-22 ENCOUNTER — Encounter: Payer: Medicare Other | Admitting: Physician Assistant

## 2016-06-22 DIAGNOSIS — I87333 Chronic venous hypertension (idiopathic) with ulcer and inflammation of bilateral lower extremity: Secondary | ICD-10-CM | POA: Diagnosis not present

## 2016-06-23 NOTE — Progress Notes (Addendum)
[name] ([id number]) Visit Report for 06/22/2016 Chief Complaint Document Details Patient Name: [name], [name]. Date of Service: 06/22/2016 12:45 PM Medical Record Number: [id number] Patient Account Number: [id number] Date of Birth/Sex: [birth year] (60 y.o. Female) Treating RN: [name] Primary Care Physician: [name] Other Clinician: Referring Physician: Sherrie [name] Treating Physician/Extender: [name], [name] in Treatment: 26 Information Obtained from: Patient Chief Complaint Patient is here for follow-up regarding her bilateral lower extremity venous stasis ulcers Electronic Signature(s) Signed: 06/23/2016 8:57:57 PM By: [name] Kelp PA-C Entered By: [name] on 06/23/2016 17:09:43 [name], [name] ([id number]) -------------------------------------------------------------------------------- HPI Details Patient Name: [name], [name] Date of Service: 06/22/2016 12:45 PM Medical Record Number: [id number] Patient Account Number: [id number] Date of Birth/Sex: [birth year] (60 y.o. Female) Treating RN: [name] Primary Care Physician: [name] Other Clinician: Referring Physician: Sherrie [name] Treating Physician/Extender: [name], Abbeygail Igoe [name] in Treatment: 26 History of Present Illness HPI Description: 12/23/15; this is a 60 year old lady who recently relocated back to [place] from [place]. She is staying with a family friend previously lived with a daughter in [place]. 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 She is on chronic prednisone she is not a diabetic. Dose of prednisone is 10 mg. The patient tells me that roughly a week ago she developed a fairly substantial blister over this area that then ruptured. She was seen in the emergency room on 5/7 an x-ray of the leg was negative she was put on Septra although I don't think any cultures were done. She  also tells me she has had chronic edema in her legs for a number of years. She had a similar presentation to currently in the right lateral lower leg roughly a year ago that she was able to heal on her own. As noted she has systemic lupus but does not have lupus nephritis according to the patient. She has a lot of edema in her lower legs. The ABI's could not be obtained. 12/31/15; the patient's insurance which is [place] makes her out of network for any home health therefore we have been changing her dressing in our facility. I reviewed her trip to the ER on 12/21/2015 her creatinine was within the normal range hemoglobin and white count normal. Culture of the wound was negative. X-ray of the leg showed soft tissue edema no foreign bodies. We have been dressing this with Aquacel Ag due to the amount of ongoing drainage 01/07/16; the patient had her arterial studies this morning I don't have these results area she comes back in to have [place] rewrap since she doesn't have access to home health 01/14/16 I still do not have the results of her arterial studies. Our nurse correctly pointed out that we've been wrapping the left leg without an open wound I think this had to do with the fact that she had so much edema when she came in I was concerned about leaving this unwrapped. 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 We left her left leg unwrapped last week as she has no open wound here today she has extensive edema here. We did order stockings however I believe the company called but they have not called them back. She comes back in today with extensive edema in the left leg. We have her  arterial studies which showed triphasic waves and all locations tested including her posterior tibial and anterior tibial arteries bilaterally. Report listed no hemodynamically significant  bilateral lower extremity arterial stenosis. She has her venous studies later this month. I'm going to rewrap the left leg due to the extent of the edema, transitioning her into the stocking we ordered last week as soon as one is available 01/27/16 the patient arrived today with pooled serosanguineous drainage over the wound it would not absorb into her Aquacel Ag which is somewhat on. In spite of this the dimensions of her wound are down considerably and the wound bed looks healthy without any need for debridement. She is obtained her juxtalite stockings the left leg which has no open wound. We are waiting for the right leg wound to get smaller before ordering her one for the right leg 02/03/16; patient wound looked improved. No debridement was required. We've been using the juxtalite [name], [name] ([id number]) stocking on the left leg which is no open wound. 02/11/16 wound is remarkably better. No debridement was required. Healthy rim of epithelialization. We have been using Aquacel Ag, we'll try to close this down with RTD over the next 2 [name] 02/18/16 wound continues to improve down a centimeter in length and width. No debridement was required. Advancing epithelialization. We started RTD last week 02/25/16: pt brought juxtalite for right lower leg today. nurse reports RTD was dry and adhered to the wound. required removal by NS. wound bed with 100% healthy red granulation tissue. denies systemic s/s of infection. 03/02/16; Considerable improvement. no debridement. 03/09/16; wound bed is very vascular no major change in dimensions. Continuing with RTD 03/16/16; unfortunately no major change in dimensions however the wound bed looks very healthy. I will continue with RTD for one more week. If his stalls consider change to collagen. 03/30/2016 -- she's been using juxta lites on her left lower extremity but her lymphedema is very significantly increased and she is not tolerating the juxta lites  the edema has increased symptoms significantly 04/06/16; apparently the RTD started sticking to the wound therefore she was changed to sorbact. Her 4-layer wrap appears to have slipped one third of the way down her leg. She was tolerating the juxtalite on the left leg, I don't see the need to wrap the left leg. She is not a candidate for external compression pumps secondary to insurance issues. 04/13/16; wound is actually measuring larger. Has been using Sorbact 04/20/16; although the wound dimensions of really not changed all that much, the better this certainly looks healthy and there is advancing epithelialization from both sides. No evidence of infection 04/27/16 changed to [facility] last week and the dimensions appear to be a lot better. No evidence of infection. She does not put the juxtalite stocking on the left leg on correctly nevertheless she has never had an open wound here. I am really uncertain whether we ever got 2 to juxtalite stockings for her. The wound started as a blister 05/04/16; finally with Hydrofera Blue last week the area on the right leg has closed over. However in inspection of her left leg she now has a small wound on the posterior aspect of the left leg. She has been using juxta lites on this area but probably not really applying them correctly and with enough tension. She is not a candidate for compression pumps secondary to insurance issues 05/11/16; the area on her right anterior leg and closed over last week however we noted an area  on her left posterior calf. I elected to wrap both her legs in anticipation we be able to transition her to juxtalite stockings. She arrives today the wraps and fallen down on both sides. The area on the right anterior leg has reopened she also had a very tense blister on the right anterior lateral leg just above her wound which I elected to excise. The area on the posterior left leg is roughly about the same 05/18/16 patient presents  today for a follow-up concerning her right and left lower extremity venous stasis ulcers. Currently her wraps which are 4-layer compression have been sliding down due to the amount of swelling that she has. This is causing stasis above the wraps were really does not need to be and subsequently leading to her having blistering especially on the left above where the wrap is. In fact she has a large wound in this area that was just blistered last week and was not nearly as bad as what it is right now. Unfortunately despite what we have tried with the compression at this point I feel that we are almost being counterproductive in this respect. She tells me that her pain as a 9 out of 10 at this point in time. this pain is worse with manipulation of the wound including cleaning as well as just touching. 05/25/16 patient presents today for follow-up unfortunately her bilateral lower extremities actually significantly more edematous throughout. Last visit her wraps that actually slipped down which had been the course of things of the past several visits. For that reason we actually with light of compression to hopefully make this more uniform. Unfortunately the Kerlex and Coban compression at this point in time appears to have been suboptimal. she continues to have a significant amount of swelling in bilateral lower Fogleman, Aithana L. ([id number]) extremities and unfortunately had several new wounds which have surfaced of the right lower extremity at this point in time. Again venous in nature. She tells me that she does "elevate her legs" as much as she can. She notes that her legs felt tight and she relates this to be may be a 8 out of 10 at this point in time but is not having as much discomfort as last week. she is on Lasix currently 20 mg. 06/01/16; apparently over the last week or 2 the patient is developed a lot more edema in her right greater than left leg. This is resulted in substantial increase  in the wounds in the right leg to a lesser extent the left leg. For reasons that are unclear she is also had reduction in the wraps she is wearing down down to a 2 layer wrap. She has a appointment with her primary physician tomorrow, currently using 40 mg of Lasix a day. The patient has a history of lupus which apparently was systemic. She also has a history of hepatitis C. Somebody has done an ultrasound with a history of a PET hepatitis C elevated LFTs. This is labeled Hepatic Elastography,. Liver is noted to have fatty infiltration no focal abnormality. Her IVC had no abnormality visualized. She did not have ascites. Chronic medical renal disease. 06/11/2016 -- the patient was recently admitted to hospital for extensive lower extremity lymphedema and was worked up and discharged with Unna boots and these were extremely tight for her and she came in for an appointment today. She has been put on new medications as per her discharge summary. 06/15/16 patient presents for evaluation today concerning bilateral lower extremity  lymphedema and venous hypertension which is chronic. At this point in time she seems to be doing somewhat better than her the last evaluation. She tells me that she is not having significant discomfort at this time fortunately. she has no interval signs or symptoms of infection at this point and we have been using Aquacel Ag on her wounds currently. She is in a 4-layer compression at this point 06/22/16 Mrs. Diliberto actually is doing welll at this point in time and regarding her lower extremity wounds. She is tolerating the compression wraps at this point. Several the woounds have actually healed and looks significantly better at this point in time. She really only has 2 wounds still open currently. Electronic Signature(s) Signed: 06/23/2016 8:57:57 PM By: [name] Kelp PA-C Entered By: [name] on 06/23/2016 17:11:13 [name], [name]  ([id number]) -------------------------------------------------------------------------------- Physical Exam Details Patient Name: Thresher, Jennifr L. Date of Service: 06/22/2016 12:45 PM Medical Record Number: [id number] Patient Account Number: [id number] Date of Birth/Sex: [birth year] (60 y.o. Female) Treating RN: [name] Primary Care Physician: [name] Other Clinician: Referring Physician: Sherrie [name] Treating Physician/Extender: STONE III, Elzina [name] [name] in Treatment: 26 Constitutional Well-nourished and well-hydrated in no acute distress. Respiratory normal breathing without difficulty. clear to auscultation bilaterally. Cardiovascular regular rate and rhythm with normal S1, S2. bilateral 2+ pittiing edema noted controlled with compression wraps. Psychiatric this patient is able to make decisions and demonstrates good insight into disease process. Alert and Oriented x 3. pleasant and cooperative. Notes Patient currently has just a left posterior leg wound and right anterior leg wound requiring dressings. The remainder of her legs actually appears very well at this point. No evidence of infection. Electronic Signature(s) Signed: 06/23/2016 8:57:57 PM By: [name] Kelp PA-C Entered By: [name] on 06/23/2016 17:11:58 [name], [name] ([id number]) -------------------------------------------------------------------------------- Physician Orders Details Patient Name: Czarnecki, Naomy L. Date of Service: 06/22/2016 12:45 PM Medical Record Number: [id number] Patient Account Number: [id number] Date of Birth/Sex: 1955-11-17 (60 y.o. Female) Treating RN: [name] Primary Care Physician: [name] Other Clinician: Referring Physician: Sherrie [name] Treating Physician/Extender: [name], Holston Oyama [name] in Treatment: 52 Verbal / Phone Orders: Yes Clinician: Curtis [name] Read Back and Verified: Yes Diagnosis Coding Wound Cleansing Wound #1 Right,Medial  Lower Leg o Cleanse wound with mild soap and water Wound #2 Left,Posterior Lower Leg o Cleanse wound with mild soap and water Wound #6 Right,Anterior Lower Leg o Cleanse wound with mild soap and water Skin Barriers/Peri-Wound Care Wound #1 Right,Medial Lower Leg o Moisturizing lotion Wound #2 Left,Posterior Lower Leg o Moisturizing lotion Wound #6 Right,Anterior Lower Leg o Moisturizing lotion Primary Wound Dressing Wound #1 Right,Medial Lower Leg o Promogran Wound #2 Left,Posterior Lower Leg o Promogran Wound #6 Right,Anterior Lower Leg o Promogran Secondary Dressing Wound #1 Right,Medial Lower Leg o ABD pad Wound #2 Left,Posterior Lower Leg o ABD pad Wegmann, Meghna L. ([id number]) Wound #6 Right,Anterior Lower Leg o ABD pad Dressing Change Frequency Wound #1 Right,Medial Lower Leg o Other: - twice weekly Wound #2 Left,Posterior Lower Leg o Other: - twice weekly Wound #6 Right,Anterior Lower Leg o Other: - twice weekly Follow-up Appointments Wound #1 Right,Medial Lower Leg o Return Appointment in 1 week. o Nurse Visit as needed Wound #2 Left,Posterior Lower Leg o Return Appointment in 1 week. o Nurse Visit as needed Wound #6 Right,Anterior Lower Leg o Return Appointment in 1 week. o Nurse Visit as needed Edema Control Wound #1 Right,Medial Lower Leg o 4 Layer Compression System -  Bilateral - wrapped lightly Wound #2 Left,Posterior Lower Leg o 4 Layer Compression System - Bilateral - wrapped lightly Wound #6 Right,Anterior Lower Leg o 4 Layer Compression System - Bilateral - wrapped lightly Additional Orders / Instructions Wound #1 Right,Medial Lower Leg o Increase protein intake. Wound #2 Left,Posterior Lower Leg o Increase protein intake. Wound #6 Right,Anterior Lower Leg o Increase protein intake. TRUDE, LUCKEN ([id number]) Home Health Wound #1 Right,Medial Lower Leg o Continue Home Health  Visits - [facility] please call [facility] at [phone] and give [place] [facility] company name so we can fax signed orders o Home Health Nurse may visit PRN to address patientos wound care needs. o FACE TO FACE ENCOUNTER: MEDICARE and MEDICAID PATIENTS: I certify that this patient is under my care and that I had a face-to-face encounter that meets the physician face-to-face encounter requirements with this patient on this date. The encounter with the patient was in whole or in part for the following MEDICAL CONDITION: (primary reason for Home Healthcare) MEDICAL NECESSITY: I certify, that based on my findings, NURSING services are a medically necessary home health service. HOME BOUND STATUS: I certify that my clinical findings support that this patient is homebound (i.e., Due to illness or injury, pt requires aid of supportive devices such as crutches, cane, wheelchairs, walkers, the use of special transportation or the assistance of another person to leave their place of residence. There is a normal inability to leave the home and doing so requires considerable and taxing effort. Other absences are for medical reasons / religious services and are infrequent or of short duration when for other reasons). o If current dressing causes regression in wound condition, may D/C ordered dressing product/s and apply Normal Saline Moist Dressing daily until next Wound Healing Center / Other MD appointment. Notify Wound Healing Center of regression in wound condition at [phone]. o Please direct any NON-WOUND related issues/requests for orders to patient's Primary Care Physician Wound #2 Left,Posterior Lower Leg o Continue Home Health Visits - [facility] please call [facility] at [phone] and give [place] [employer] company name so we can fax signed orders o Home Health Nurse may visit PRN to address patientos wound care needs. o FACE TO FACE ENCOUNTER: MEDICARE and MEDICAID PATIENTS:  I certify that this patient is under my care and that I had a face-to-face encounter that meets the physician face-to-face encounter requirements with this patient on this date. The encounter with the patient was in whole or in part for the following MEDICAL CONDITION: (primary reason for Home Healthcare) MEDICAL NECESSITY: I certify, that based on my findings, NURSING services are a medically necessary home health service. HOME BOUND STATUS: I certify that my clinical findings support that this patient is homebound (i.e., Due to illness or injury, pt requires aid of supportive devices such as crutches, cane, wheelchairs, walkers, the use of special transportation or the assistance of another person to leave their place of residence. There is a normal inability to leave the home and doing so requires considerable and taxing effort. Other absences are for medical reasons / religious services and are infrequent or of short duration when for other reasons). o If current dressing causes regression in wound condition, may D/C ordered dressing product/s and apply Normal Saline Moist Dressing daily until next Wound Healing Center / Other MD appointment. Notify Wound Healing Center of regression in wound condition at [phone]. o Please direct any NON-WOUND related issues/requests for orders  to patient's Primary Care Physician Wound #6 Right,Anterior Lower Leg o [facility] at [phone] and [facility] so we can fax signed orders o Home Health Nurse may visit PRN to address patientos wound care needs. Liane ComberCRAWFORD, Jamaia L. ([id number]) o FACE TO FACE ENCOUNTER: MEDICARE and MEDICAID PATIENTS: I certify that this patient is under my care and that I had a face-to-face encounter that meets the physician face-to-face encounter requirements with this patient on this date. The encounter with the patient was in whole or  in part for the following MEDICAL CONDITION: (primary reason for Home Healthcare) MEDICAL NECESSITY: I certify, that based on my findings, NURSING services are a medically necessary home health service. HOME BOUND STATUS: I certify that my clinical findings support that this patient is homebound (i.e., Due to illness or injury, pt requires aid of supportive devices such as crutches, cane, wheelchairs, walkers, the use of special transportation or the assistance of another person to leave their place of residence. There is a normal inability to leave the home and doing so requires considerable and taxing effort. Other absences are for medical reasons / religious services and are infrequent or of short duration when for other reasons). o If current dressing causes regression in wound condition, may D/C ordered dressing product/s and apply Normal Saline Moist Dressing daily until next Wound Healing Center / Other MD appointment. Notify Wound Healing Center of regression in wound condition at [phone]. o Please direct any NON-WOUND related issues/requests for orders to patient's Primary Care Physician Electronic Signature(s) Signed: 06/22/2016 5:44:00 PM By: [name] Sitesorthy, Joanna Signed: 06/23/2016 12:52:14 AM By: [name] KelpStone III, Vonette Grosso PA-C Entered By: [name] Sitesorthy, Joanna on 06/22/2016 13:55:40 [name], [name] MallardGLORIA L. ([id number]) -------------------------------------------------------------------------------- Problem List Details Patient Name: Wirtz, Jaicee L. Date of Service: 06/22/2016 12:45 PM Medical Record Number: [id number] Patient Account Number: [id number] Date of Birth/Sex: 02/14/1956 [age] y.o. Female) Treating RN: [name] Sitesorthy, Joanna Primary Care Physician: [name] MustacheJADALI, FAYEGH Other Clinician: Referring Physician: Sherrie MustacheJADALI, FAYEGH Treating Physician/Extender: Skeet SimmerSTONE III, Lucca Greggs [name] in Treatment: 26 Active Problems ICD-10 Encounter Code Description Active Date Diagnosis I87.331 Chronic venous hypertension  (idiopathic) with ulcer and 12/23/2015 Yes inflammation of right lower extremity I87.322 Chronic venous hypertension (idiopathic) with 12/23/2015 Yes inflammation of left lower extremity L97.211 Non-pressure chronic ulcer of right calf limited to 03/02/2016 Yes breakdown of skin I89.0 Lymphedema, not elsewhere classified 06/01/2016 Yes M32.10 Systemic lupus erythematosus, organ or system 12/23/2015 Yes involvement unspecified Inactive Problems Resolved Problems Electronic Signature(s) Signed: 06/23/2016 12:52:14 AM By: [name] KelpStone III, Adie Vilar PA-C Entered By: [name] KelpStone III, Elle Vezina on 06/22/2016 23:53:24 [name], [name] MallardGLORIA L. ([id number]) -------------------------------------------------------------------------------- Progress Note Details Patient Name: Much, Sydnei L. Date of Service: 06/22/2016 12:45 PM Medical Record Number: [id number] Patient Account Number: [id number] Date of Birth/Sex: 09/12/1955 [age] y.o. Female) Treating RN: [name] Sitesorthy, Joanna Primary Care Physician: [name] MustacheJADALI, FAYEGH Other Clinician: Referring Physician: Sherrie MustacheJADALI, FAYEGH Treating Physician/Extender: [name] DibblesSTONE III, Lyndell Gillyard [name] in Treatment: 26 Subjective Chief Complaint Information obtained from Patient Patient is here for follow-up regarding her bilateral lower extremity venous stasis ulcers History of Present Illness (HPI) 12/23/15; this is a 60 year old lady who recently relocated back to [place] from [place]. She is staying with a family friend previously lived with a daughter in [place]. 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 She is on chronic prednisone she is not a diabetic. Dose of prednisone is 10 mg. The  patient tells me that roughly a week ago she developed a fairly substantial blister over this area that then ruptured. She was seen in the emergency room on 5/7 an x-ray of the leg was negative she was put on Septra although I don't think any cultures were done.  She also tells me she has had chronic edema in her legs for a number of years. She had a similar presentation to currently in the right lateral lower leg roughly a year ago that she was able to heal on her own. As noted she has systemic lupus but does not have lupus nephritis according to the patient. She has a lot of edema in her lower legs. The ABI's could not be obtained. 12/31/15; the patient's insurance which is [place] makes her out of network for any home health therefore we have been changing her dressing in our facility. I reviewed her trip to the ER on 12/21/2015 her creatinine was within the normal range hemoglobin and white count normal. Culture of the wound was negative. X-ray of the leg showed soft tissue edema no foreign bodies. We have been dressing this with Aquacel Ag due to the amount of ongoing drainage 01/07/16; the patient had her arterial studies this morning I don't have these results area she comes back in to have [place] rewrap since she doesn't have access to home health 01/14/16 I still do not have the results of her arterial studies. Our nurse correctly pointed out that we've been wrapping the left leg without an open wound I think this had to do with the fact that she had so much edema when she came in I was concerned about leaving this unwrapped. 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 We left her left leg unwrapped last week as she has no open wound here today she has extensive edema here. We did order stockings however I believe the company called but they have not called them back. She comes back in today with extensive edema in the left leg. We have her arterial studies which showed triphasic waves and all locations tested including her posterior tibial and anterior tibial arteries bilaterally. Report listed no hemodynamically  significant bilateral lower extremity arterial stenosis. She has her venous studies later this month. I'm going to rewrap the left leg due to the extent of the edema, transitioning her into the stocking we ordered last week as soon as one is available JAYLEAH, LOVAGLIO. ([id number]) 01/27/16 the patient arrived today with pooled serosanguineous drainage over the wound it would not absorb into her Aquacel Ag which is somewhat on. In spite of this the dimensions of her wound are down considerably and the wound bed looks healthy without any need for debridement. She is obtained her juxtalite stockings the left leg which has no open wound. We are waiting for the right leg wound to get smaller before ordering her one for the right leg 02/03/16; patient wound looked improved. No debridement was required. We've been using the juxtalite stocking on the left leg which is no open wound. 02/11/16 wound is remarkably better. No debridement was required. Healthy rim of epithelialization. We have been using Aquacel Ag, we'll try to close this down with RTD over the next 2 [name] 02/18/16 wound continues to improve down a centimeter in length and width. No debridement was required. Advancing epithelialization. We started RTD  last week 02/25/16: pt brought juxtalite for right lower leg today. nurse reports RTD was dry and adhered to the wound. required removal by NS. wound bed with 100% healthy red granulation tissue. denies systemic s/s of infection. 03/02/16; Considerable improvement. no debridement. 03/09/16; wound bed is very vascular no major change in dimensions. Continuing with RTD 03/16/16; unfortunately no major change in dimensions however the wound bed looks very healthy. I will continue with RTD for one more week. If his stalls consider change to collagen. 03/30/2016 -- she's been using juxta lites on her left lower extremity but her lymphedema is very significantly increased and she is not tolerating the  juxta lites the edema has increased symptoms significantly 04/06/16; apparently the RTD started sticking to the wound therefore she was changed to sorbact. Her 4-layer wrap appears to have slipped one third of the way down her leg. She was tolerating the juxtalite on the left leg, I don't see the need to wrap the left leg. She is not a candidate for external compression pumps secondary to insurance issues. 04/13/16; wound is actually measuring larger. Has been using Sorbact 04/20/16; although the wound dimensions of really not changed all that much, the better this certainly looks healthy and there is advancing epithelialization from both sides. No evidence of infection 04/27/16 changed to [facility] last week and the dimensions appear to be a lot better. No evidence of infection. She does not put the juxtalite stocking on the left leg on correctly nevertheless she has never had an open wound here. I am really uncertain whether we ever got 2 to juxtalite stockings for her. The wound started as a blister 05/04/16; finally with Hydrofera Blue last week the area on the right leg has closed over. However in inspection of her left leg she now has a small wound on the posterior aspect of the left leg. She has been using juxta lites on this area but probably not really applying them correctly and with enough tension. She is not a candidate for compression pumps secondary to insurance issues 05/11/16; the area on her right anterior leg and closed over last week however we noted an area on her left posterior calf. I elected to wrap both her legs in anticipation we be able to transition her to juxtalite stockings. She arrives today the wraps and fallen down on both sides. The area on the right anterior leg has reopened she also had a very tense blister on the right anterior lateral leg just above her wound which I elected to excise. The area on the posterior left leg is roughly about the same 05/18/16  patient presents today for a follow-up concerning her right and left lower extremity venous stasis ulcers. Currently her wraps which are 4-layer compression have been sliding down due to the amount of swelling that she has. This is causing stasis above the wraps were really does not need to be and subsequently leading to her having blistering especially on the left above where the wrap is. In fact she has a large wound in this area that was just blistered last week and was not nearly as bad as what it is right now. Unfortunately despite what we have tried with the compression at this point I feel that we are almost being counterproductive in this respect. She tells me that her pain as a 9 out of 10 at this point in time. this pain is worse with manipulation of the wound including cleaning as well as just  touching. [name], [name] ([id number]) 05/25/16 patient presents today for follow-up unfortunately her bilateral lower extremities actually significantly more edematous throughout. Last visit her wraps that actually slipped down which had been the course of things of the past several visits. For that reason we actually with light of compression to hopefully make this more uniform. Unfortunately the Kerlex and Coban compression at this point in time appears to have been suboptimal. she continues to have a significant amount of swelling in bilateral lower extremities and unfortunately had several new wounds which have surfaced of the right lower extremity at this point in time. Again venous in nature. She tells me that she does "elevate her legs" as much as she can. She notes that her legs felt tight and she relates this to be may be a 8 out of 10 at this point in time but is not having as much discomfort as last week. she is on Lasix currently 20 mg. 06/01/16; apparently over the last week or 2 the patient is developed a lot more edema in her right greater than left leg. This is resulted in  substantial increase in the wounds in the right leg to a lesser extent the left leg. For reasons that are unclear she is also had reduction in the wraps she is wearing down down to a 2 layer wrap. She has a appointment with her primary physician tomorrow, currently using 40 mg of Lasix a day. The patient has a history of lupus which apparently was systemic. She also has a history of hepatitis C. Somebody has done an ultrasound with a history of a PET hepatitis C elevated LFTs. This is labeled Hepatic Elastography,. Liver is noted to have fatty infiltration no focal abnormality. Her IVC had no abnormality visualized. She did not have ascites. Chronic medical renal disease. 06/11/2016 -- the patient was recently admitted to hospital for extensive lower extremity lymphedema and was worked up and discharged with Unna boots and these were extremely tight for her and she came in for an appointment today. She has been put on new medications as per her discharge summary. 06/15/16 patient presents for evaluation today concerning bilateral lower extremity lymphedema and venous hypertension which is chronic. At this point in time she seems to be doing somewhat better than her the last evaluation. She tells me that she is not having significant discomfort at this time fortunately. she has no interval signs or symptoms of infection at this point and we have been using Aquacel Ag on her wounds currently. She is in a 4-layer compression at this point 06/22/16 Mrs. Dardis actually is doing welll at this point in time and regarding her lower extremity wounds. She is tolerating the compression wraps at this point. Several the woounds have actually healed and looks significantly better at this point in time. She really only has 2 wounds still open currently. Objective Constitutional Well-nourished and well-hydrated in no acute distress. Vitals Time Taken: 1:00 PM, Height: 62 in, Weight: 170 lbs, BMI: 31.1,  Temperature: 98.5 F, Pulse: 95 bpm, Respiratory Rate: 20 breaths/min, Blood Pressure: 137/64 mmHg. Respiratory normal breathing without difficulty. clear to auscultation bilaterally. Cardiovascular [name], [name] ([id number]) regular rate and rhythm with normal S1, S2. bilateral 2+ pittiing edema noted controlled with compression wraps. Psychiatric this patient is able to make decisions and demonstrates good insight into disease process. Alert and Oriented x 3. pleasant and cooperative. General Notes: Patient currently has just a left posterior leg wound and right anterior leg wound  requiring dressings. The remainder of her legs actually appears very well at this point. No evidence of infection. Integumentary (Hair, Skin) Wound #1 status is Open. Original cause of wound was Blister. The wound is located on the Right,Medial Lower Leg. The wound measures 0.2cm length x 0.2cm width x 0.1cm depth; 0.031cm^2 area and 0.003cm^3 volume. The wound is limited to skin breakdown. There is no tunneling or undermining noted. There is a small amount of sanguinous drainage noted. The wound margin is flat and intact. There is large (67-100%) red granulation within the wound bed. There is no necrotic tissue within the wound bed. The periwound skin appearance did not exhibit: Callus, Crepitus, Excoriation, Fluctuance, Friable, Induration, Localized Edema, Rash, Scarring, Dry/Scaly, Maceration, Moist, Atrophie Blanche, Cyanosis, Ecchymosis, Hemosiderin Staining, Mottled, Pallor, Rubor, Erythema. Periwound temperature was noted as No Abnormality. Wound #2 status is Open. Original cause of wound was Gradually Appeared. The wound is located on the Left,Posterior Lower Leg. The wound measures 0.1cm length x 0.1cm width x 0.1cm depth; 0.008cm^2 area and 0.001cm^3 volume. The wound is limited to skin breakdown. There is no tunneling or undermining noted. There is a small amount of sanguinous drainage noted.  The wound margin is flat and intact. There is large (67-100%) red granulation within the wound bed. There is no necrotic tissue within the wound bed. The periwound skin appearance did not exhibit: Callus, Crepitus, Excoriation, Fluctuance, Friable, Induration, Localized Edema, Rash, Scarring, Dry/Scaly, Maceration, Moist, Atrophie Blanche, Cyanosis, Ecchymosis, Hemosiderin Staining, Mottled, Pallor, Rubor, Erythema. The periwound has tenderness on palpation. Wound #3 status is Healed - Epithelialized. Original cause of wound was Blister. The wound is located on the Right,Proximal,Medial Lower Leg. The wound measures 0cm length x 0cm width x 0cm depth; 0cm^2 area and 0cm^3 volume. There is no tunneling or undermining noted. There is a none present amount of drainage noted. The wound margin is flat and intact. There is no granulation within the wound bed. There is no necrotic tissue within the wound bed. Wound #6 status is Open. Original cause of wound was Gradually Appeared. The wound is located on the Right,Anterior Lower Leg. The wound measures 0.2cm length x 0.2cm width x 0.1cm depth; 0.031cm^2 area and 0.003cm^3 volume. The wound is limited to skin breakdown. There is no tunneling or undermining noted. There is a medium amount of sanguinous drainage noted. The wound margin is flat and intact. There is large (67-100%) red granulation within the wound bed. There is no necrotic tissue within the wound bed. The periwound skin appearance did not exhibit: Callus, Crepitus, Excoriation, Fluctuance, Friable, Induration, Localized Edema, Rash, Scarring, Dry/Scaly, Maceration, Moist, Atrophie Blanche, Cyanosis, Ecchymosis, Hemosiderin Staining, Mottled, Pallor, Rubor, Erythema. The periwound has tenderness on palpation. TYLYNN, BRANIFF ([id number]) Assessment Active Problems ICD-10 I87.331 - 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 lower extremity I87.322 -  Chronic venous hypertension (idiopathic) with inflammation of left lower extremity L97.211 - Non-pressure chronic ulcer of right calf limited to breakdown of skin I89.0 - Lymphedema, not elsewhere classified M32.10 - Systemic lupus erythematosus, organ or system involvement unspecified Plan Wound Cleansing: Wound #1 Right,Medial Lower Leg: Cleanse wound with mild soap and water Wound #2 Left,Posterior Lower Leg: Cleanse wound with mild soap and water Wound #6 Right,Anterior Lower Leg: Cleanse wound with mild soap and water Skin Barriers/Peri-Wound Care: Wound #1 Right,Medial Lower Leg: Moisturizing lotion Wound #2 Left,Posterior Lower Leg: Moisturizing lotion Wound #6 Right,Anterior Lower Leg: Moisturizing lotion Primary Wound Dressing: Wound #1 Right,Medial Lower  Leg: Promogran Wound #2 Left,Posterior Lower Leg: Promogran Wound #6 Right,Anterior Lower Leg: Promogran Secondary Dressing: Wound #1 Right,Medial Lower Leg: ABD pad Wound #2 Left,Posterior Lower Leg: ABD pad Wound #6 Right,Anterior Lower Leg: ABD pad Dressing Change Frequency: Wound #1 Right,Medial Lower Leg: Other: - twice weekly Wound #2 Left,Posterior Lower Leg: [name], [name] ([id number]) Other: - twice weekly Wound #6 Right,Anterior Lower Leg: Other: - twice weekly Follow-up Appointments: Wound #1 Right,Medial Lower Leg: Return Appointment in 1 week. Nurse Visit as needed Wound #2 Left,Posterior Lower Leg: Return Appointment in 1 week. Nurse Visit as needed Wound #6 Right,Anterior Lower Leg: Return Appointment in 1 week. Nurse Visit as needed Edema Control: Wound #1 Right,Medial Lower Leg: 4 Layer Compression System - Bilateral - wrapped lightly Wound #2 Left,Posterior Lower Leg: 4 Layer Compression System - Bilateral - wrapped lightly Wound #6 Right,Anterior Lower Leg: 4 Layer Compression System - Bilateral - wrapped lightly Additional Orders / Instructions: Wound #1 Right,Medial Lower  Leg: Increase protein intake. Wound #2 Left,Posterior Lower Leg: Increase protein intake. Wound #6 Right,Anterior Lower Leg: Increase protein intake. Home Health: Wound #1 Right,Medial Lower Leg: Continue Home Health Visits - [facility] please call [facility] at [phone] and give [place] [facility] company name so we can fax signed orders Home Health Nurse may visit PRN to address patient s wound care needs. FACE TO FACE ENCOUNTER: MEDICARE and MEDICAID PATIENTS: I certify that this patient is under my care and that I had a face-to-face encounter that meets the physician face-to-face encounter requirements with this patient on this date. The encounter with the patient was in whole or in part for the following MEDICAL CONDITION: (primary reason for Home Healthcare) MEDICAL NECESSITY: I certify, that based on my findings, NURSING services are a medically necessary home health service. HOME BOUND STATUS: I certify that my clinical findings support that this patient is homebound (i.e., Due to illness or injury, pt requires aid of supportive devices such as crutches, cane, wheelchairs, walkers, the use of special transportation or the assistance of another person to leave their place of residence. There is a normal inability to leave the home and doing so requires considerable and taxing effort. Other absences are for medical reasons / religious services and are infrequent or of short duration when for other reasons). If current dressing causes regression in wound condition, may D/C ordered dressing product/s and apply Normal Saline Moist Dressing daily until next Wound Healing Center / Other MD appointment. Notify Wound Healing Center of regression in wound condition at [phone]. Please direct any NON-WOUND related issues/requests for orders to patient's Primary Care Physician Wound #2 Left,Posterior Lower Leg: Continue Home Health Visits - [facility] please call [facility] at [phone] and give [place] [facility] company name so we can fax signed orders Home Health Nurse may visit PRN to address patient s wound care needs. FACE TO FACE ENCOUNTER: MEDICARE and MEDICAID PATIENTS: I certify that this patient is under [name], [name] ([id number]) my care and that I had a face-to-face encounter that meets the physician face-to-face encounter requirements with this patient on this date. The encounter with the patient was in whole or in part for the following MEDICAL CONDITION: (primary reason for Home Healthcare) MEDICAL NECESSITY: I certify, that based on my findings, NURSING services are a medically necessary home health service. HOME BOUND STATUS: I certify that my clinical findings support that this patient is homebound (i.e., Due to illness or injury,  pt requires aid of supportive devices such as crutches, cane, wheelchairs, walkers, the use of special transportation or the assistance of another person to leave their place of residence. There is a normal inability to leave the home and doing so requires considerable and taxing effort. Other absences are for medical reasons / religious services and are infrequent or of short duration when for other reasons). If current dressing causes regression in wound condition, may D/C ordered dressing product/s and apply Normal Saline Moist Dressing daily until next Wound Healing Center / Other MD appointment. Notify Wound Healing Center of regression in wound condition at [phone]. Please direct any NON-WOUND related issues/requests for orders to patient's Primary Care Physician Wound #6 Right,Anterior Lower Leg: Continue Home Health Visits - [facility] please call [facility] at [phone] and give [place] [facility] company name so we can fax signed orders Home Health Nurse may visit PRN to address patient s wound care needs. FACE TO FACE ENCOUNTER: MEDICARE and MEDICAID PATIENTS: I certify that this patient is under my care and that I  had a face-to-face encounter that meets the physician face-to-face encounter requirements with this patient on this date. The encounter with the patient was in whole or in part for the following MEDICAL CONDITION: (primary reason for Home Healthcare) MEDICAL NECESSITY: I certify, that based on my findings, NURSING services are a medically necessary home health service. HOME BOUND STATUS: I certify that my clinical findings support that this patient is homebound (i.e., Due to illness or injury, pt requires aid of supportive devices such as crutches, cane, wheelchairs, walkers, the use of special transportation or the assistance of another person to leave their place of residence. There is a normal inability to leave the home and doing so requires considerable and taxing effort. Other absences are for medical reasons / religious services and are infrequent or of short duration when for other reasons). If current dressing causes regression in wound condition, may D/C ordered dressing product/s and apply Normal Saline Moist Dressing daily until next Wound Healing Center / Other MD appointment. Notify Wound Healing Center of regression in wound condition at [phone]. Please direct any NON-WOUND related issues/requests for orders to patient's Primary Care Physician Follow-Up Appointments: A follow-up appointment should be scheduled. A Patient Clinical Summary of Care was provided to [facility] At this point in time I'm going to discontinue the silver alginate dressings and we are going to utilize a plain collagen drressing. sshe only has 2 remaining wounds at thiss point in both appear to be very dry on evaluation today. Hopefully the collagen will help these to epithelialized over as well. We will continue with the 4-layer compression wraps as well. If she has any other concerns in the interim she will contact the office and let us know otherwise all questions were encouraged and answered to the best of  my ability during the evaluation today. ZAMARIYA, NEAL ([id number]) Electronic Signature(s) Signed: 06/23/2016 8:57:57 PM By: [name] Kelp PA-C Entered By: [name] on 06/23/2016 17:13:25 Bruhl, [name] ([id number]) -------------------------------------------------------------------------------- SuperBill Details Patient Name: Kissner, Dorianne L. Date of Service: 06/22/2016 Medical Record Number: [id number] Patient Account Number: [id number] Date of Birth/Sex: 08-22-1955 (60 y.o. Female) Treating RN: [name] Primary Care Physician: [name] Other Clinician: Referring Physician: Sherrie [name] Treating Physician/Extender: [name], Itsel Opfer [name] in Treatment: 26 Diagnosis Coding ICD-10 Codes Code Description 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 lower I87.331 extremity I87.322 Chronic venous hypertension (  idiopathic) with inflammation of left lower extremity L97.211 Non-pressure chronic ulcer of right calf limited to breakdown of skin I89.0 Lymphedema, not elsewhere classified M32.10 Systemic lupus erythematosus, organ or system involvement unspecified Facility Procedures CPT4: Description Modifier Quantity Code [id number] BILATERAL: Application of multi-layer venous compression 1 system; leg (below knee), including ankle and foot. Physician Procedures CPT4: Description Modifier Quantity Code [id number] - WC PHYS LEVEL 3 - EST PT 1 ICD-10 Description Diagnosis I87.331 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 lower extremity I87.322 Chronic venous hypertension  (idiopathic) with inflammation of left lower extremity L97.211 Non-pressure chronic ulcer of right calf limited to breakdown of skin I89.0 Lymphedema, not elsewhere classified Electronic Signature(s) Signed: 06/23/2016 12:52:14 AM By: [name] Kelp PA-C Previous Signature: 06/22/2016 2:54:23 PM Version By: [name] Entered By: [name] on 06/22/2016 23:53:48

## 2016-06-23 NOTE — Progress Notes (Addendum)
[name], Ahmoni L. ([id number]) Visit Report for 06/22/2016 Arrival Information Details Patient Name: [name], [name] Date of Service: 06/22/2016 12:45 PM Medical Record Number: [id number] Patient Account Number: [id number] Date of Birth/Sex: 07/17/1956 [age] y.o. Female) Treating RN: Curtis Sitesorthy, Joanna Primary Care Physician: Sherrie MustacheJADALI, FAYEGH Other Clinician: Referring Physician: Sherrie MustacheJADALI, FAYEGH Treating Physician/Extender: Linwood DibblesSTONE III, HOYT Weeks in Treatment: 26 Visit Information History Since Last Visit Added or deleted any medications: No Patient Arrived: Walker Any new allergies or adverse reactions: No Arrival Time: 12:52 Had a fall or experienced change in No Accompanied By: self activities of daily living that may affect Transfer Assistance: None risk of falls: Patient Identification Verified: Yes Signs or symptoms of abuse/neglect since last No Secondary Verification Process Completed: Yes visito Patient Requires Transmission-Based No Hospitalized since last visit: No Precautions: Pain Present Now: No Patient Has Alerts: No Electronic Signature(s) Signed: 06/22/2016 5:44:00 PM By: Curtis Sitesorthy, Joanna Entered By: Curtis Sitesorthy, Joanna on 06/22/2016 12:59:31 Jarrett, [name] ([id number]) -------------------------------------------------------------------------------- Encounter Discharge Information Details Patient Name: [name], [name] Date of Service: 06/22/2016 12:45 PM Medical Record Number: [id number] Patient Account Number: [id number] Date of Birth/Sex: 01/31/1956 [age] y.o. Female) Treating RN: Curtis Sitesorthy, Joanna Primary Care Physician: Sherrie MustacheJADALI, FAYEGH Other Clinician: Referring Physician: Sherrie MustacheJADALI, FAYEGH Treating Physician/Extender: Linwood DibblesSTONE III, HOYT Weeks in Treatment: 7026 Encounter Discharge Information Items Discharge Pain Level: 0 Discharge Condition: Stable Ambulatory Status: Walker Discharge Destination: Home Transportation: Private Auto Accompanied By: self Schedule  Follow-up Appointment: Yes Medication Reconciliation completed No and provided to Patient/Care Jerrianne Hartin: Provided on Clinical Summary of Care: 06/22/2016 Form Type Recipient Paper Patient GC Electronic Signature(s) Signed: 06/22/2016 2:55:16 PM By: Curtis Sitesorthy, Joanna Previous Signature: 06/22/2016 2:20:32 PM Version By: Gwenlyn PerkingMoore, Shelia Entered By: Curtis Sitesorthy, Joanna on 06/22/2016 14:55:16 Rohde, [name] ([id number]) -------------------------------------------------------------------------------- Lower Extremity Assessment Details Patient Name: Dubuc, [name] Date of Service: 06/22/2016 12:45 PM Medical Record Number: [id number] Patient Account Number: [id number] Date of Birth/Sex: 10/16/1955 [age] y.o. Female) Treating RN: Curtis Sitesorthy, Joanna Primary Care Physician: Sherrie MustacheJADALI, FAYEGH Other Clinician: Referring Physician: Sherrie MustacheJADALI, FAYEGH Treating Physician/Extender: Linwood DibblesSTONE III, HOYT Weeks in Treatment: 26 Edema Assessment Assessed: [Left: No] [Right: No] Edema: [Left: Yes] [Right: Yes] Calf Left: Right: Point of Measurement: 31 cm From Medial Instep cm cm Ankle Left: Right: Point of Measurement: 11 cm From Medial Instep cm cm Vascular Assessment Pulses: Posterior Tibial Dorsalis Pedis Palpable: [Left:Yes] [Right:Yes] Extremity colors, hair growth, and conditions: Extremity Color: [Left:Hyperpigmented] [Right:Hyperpigmented] Hair Growth on Extremity: [Left:No] [Right:No] Temperature of Extremity: [Left:Warm] [Right:Warm] Capillary Refill: [Left:< 3 seconds] [Right:< 3 seconds] Electronic Signature(s) Signed: 06/22/2016 5:44:00 PM By: Curtis Sitesorthy, Joanna Entered By: Curtis Sitesorthy, Joanna on 06/22/2016 13:07:17 Loyer, Lilliah Elbert EwingsL. ([id number]) -------------------------------------------------------------------------------- Multi Wound Chart Details Patient Name: [name], [name] Date of Service: 06/22/2016 12:45 PM Medical Record Number: [id number] Patient Account Number: [id number] Date of  Birth/Sex: 03/10/1956 [age] y.o. Female) Treating RN: Curtis Sitesorthy, Joanna Primary Care Physician: Sherrie MustacheJADALI, FAYEGH Other Clinician: Referring Physician: Sherrie MustacheJADALI, FAYEGH Treating Physician/Extender: Linwood DibblesSTONE III, HOYT Weeks in Treatment: 26 Vital Signs Height(in): 62 Pulse(bpm): 95 Weight(lbs): 170 Blood Pressure 137/64 (mmHg): Body Mass Index(BMI): 31 Temperature(F): 98.5 Respiratory Rate 20 (breaths/min): Photos: Wound Location: Right Lower Leg - Medial Left Lower Leg - Posterior Right Lower Leg - Medial, Proximal Wounding Event: Blister Gradually Appeared Blister Primary Etiology: Venous Leg Ulcer Lymphedema Venous Leg Ulcer Date Acquired: 12/15/2015 04/19/2016 05/18/2016 Weeks of Treatment: 26 7 5  Wound Status: Open Open Healed - Epithelialized Clustered Wound: No Yes No Measurements L x W x D 0.2x0.2x0.1 0.1x0.1x0.1 0x0x0 (cm) Area (cm) :  0.031 0.008 0 Volume (cm) : 0.003 0.001 0 % Reduction in Area: 99.90% 99.10% 100.00% % Reduction in Volume: 99.90% 98.80% 100.00% Classification: Partial Thickness Partial Thickness Full Thickness Without Exposed Support Structures Exudate Amount: Small Small None Present Exudate Type: Sanguinous Sanguinous N/A Exudate Color: red red N/A Wound Margin: Flat and Intact Flat and Intact Flat and Intact Granulation Amount: Large (67-100%) Large (67-100%) None Present (0%) Granulation Quality: Red Red N/A [name], [name] ([id number]) Necrotic Amount: None Present (0%) None Present (0%) None Present (0%) Exposed Structures: Fascia: No Fascia: No N/A Fat: No Fat: No Tendon: No Tendon: No Muscle: No Muscle: No Joint: No Joint: No Bone: No Bone: No Limited to Skin Limited to Skin Breakdown Breakdown Epithelialization: Large (67-100%) Large (67-100%) Large (67-100%) Periwound Skin Texture: Edema: No Edema: No No Abnormalities Noted Excoriation: No Excoriation: No Induration: No Induration: No Callus: No Callus: No Crepitus:  No Crepitus: No Fluctuance: No Fluctuance: No Friable: No Friable: No Rash: No Rash: No Scarring: No Scarring: No Periwound Skin Maceration: No Maceration: No No Abnormalities Noted Moisture: Moist: No Moist: No Dry/Scaly: No Dry/Scaly: No Periwound Skin Color: Atrophie Blanche: No Atrophie Blanche: No No Abnormalities Noted Cyanosis: No Cyanosis: No Ecchymosis: No Ecchymosis: No Erythema: No Erythema: No Hemosiderin Staining: No Hemosiderin Staining: No Mottled: No Mottled: No Pallor: No Pallor: No Rubor: No Rubor: No Temperature: No Abnormality N/A N/A Tenderness on No Yes No Palpation: Wound Preparation: Ulcer Cleansing: Other: Ulcer Cleansing: Other: Ulcer Cleansing: Other: soap and water soap and water soap anw water Topical Anesthetic Topical Anesthetic Applied: Other: lidocaine Applied: None 4% Wound Number: 6 N/A N/A Photos: N/A N/A Wound Location: Right Lower Leg - Anterior N/A N/A Wounding Event: Gradually Appeared N/A N/A Buehring, Damara L. ([id number]) Primary Etiology: Venous Leg Ulcer N/A N/A Date Acquired: 06/11/2016 N/A N/A Weeks of Treatment: 1 N/A N/A Wound Status: Open N/A N/A Clustered Wound: No N/A N/A Measurements L x W x D 0.2x0.2x0.1 N/A N/A (cm) Area (cm) : 0.031 N/A N/A Volume (cm) : 0.003 N/A N/A % Reduction in Area: 97.10% N/A N/A % Reduction in Volume: 97.20% N/A N/A Classification: Partial Thickness N/A N/A Exudate Amount: Medium N/A N/A Exudate Type: Sanguinous N/A N/A Exudate Color: red N/A N/A Wound Margin: Flat and Intact N/A N/A Granulation Amount: Large (67-100%) N/A N/A Granulation Quality: Red N/A N/A Necrotic Amount: None Present (0%) N/A N/A Exposed Structures: Fascia: No N/A N/A Fat: No Tendon: No Muscle: No Joint: No Bone: No Limited to Skin Breakdown Epithelialization: Large (67-100%) N/A N/A Periwound Skin Texture: Edema: No N/A N/A Excoriation: No Induration: No Callus: No Crepitus:  No Fluctuance: No Friable: No Rash: No Scarring: No Periwound Skin Maceration: No N/A N/A Moisture: Moist: No Dry/Scaly: No Periwound Skin Color: Atrophie Blanche: No N/A N/A Cyanosis: No Ecchymosis: No Erythema: No Hemosiderin Staining: No Mottled: No Pallor: No Rubor: No Temperature: N/A N/A N/A Yes N/A N/A Epps, Lauris L. ([id number]) Tenderness on Palpation: Wound Preparation: Ulcer Cleansing: Other: N/A N/A soap and water Topical Anesthetic Applied: None Treatment Notes Electronic Signature(s) Signed: 06/22/2016 5:44:00 PM By: Curtis Sites Entered By: Curtis Sites on 06/22/2016 13:53:28 Walmsley, [name] ([id number]) -------------------------------------------------------------------------------- Multi-Disciplinary Care Plan Details Patient Name: Hendryx, Yesena L. Date of Service: 06/22/2016 12:45 PM Medical Record Number: [id number] Patient Account Number: [id number] Date of Birth/Sex: [birth year] (60 y.o. Female) Treating RN: Curtis Sites Primary Care Physician: Sherrie Mustache Other Clinician: Referring Physician: Sherrie Mustache Treating Physician/Extender: STONE III, HOYT Weeks in Treatment: 69  Active Inactive Abuse / Safety / Falls / Self Care Management Nursing Diagnoses: Impaired physical mobility Potential for falls Goals: Patient will remain injury free Date Initiated: 12/23/2015 Goal Status: Active Interventions: Assess fall risk on admission and as needed Notes: Nutrition Nursing Diagnoses: Imbalanced nutrition Goals: Patient/caregiver verbalizes understanding of need to maintain therapeutic glucose control per primary care physician Date Initiated: 12/23/2015 Goal Status: Active Interventions: Provide education on nutrition Treatment Activities: Education provided on Nutrition : 04/20/2016 Notes: Orientation to the Wound Care Program Nursing Diagnoses: JEANICE, DEMPSEY ([id number]) Knowledge deficit related to the wound healing  center program Goals: Patient/caregiver will verbalize understanding of the Wound Healing Center Program Date Initiated: 12/23/2015 Goal Status: Active Interventions: Provide education on orientation to the wound center Notes: Venous Leg Ulcer Nursing Diagnoses: Potential for venous Insuffiency (use before diagnosis confirmed) Goals: Patient will maintain optimal edema control Date Initiated: 12/23/2015 Goal Status: Active Interventions: Provide education on venous insufficiency Notes: Wound/Skin Impairment Nursing Diagnoses: Impaired tissue integrity Goals: Ulcer/skin breakdown will heal within 14 weeks Date Initiated: 12/23/2015 Goal Status: Active Interventions: Assess ulceration(s) every visit Notes: Electronic Signature(s) Signed: 06/22/2016 5:44:00 PM By: Curtis Sites Entered By: Curtis Sites on 06/22/2016 13:53:21 Shuey, [name] ([id number]) -------------------------------------------------------------------------------- Pain Assessment Details Patient Name: Templer, Julanne L. Date of Service: 06/22/2016 12:45 PM Medical Record Number: [id number] Patient Account Number: [id number] Date of Birth/Sex: September 13, 1955 (60 y.o. Female) Treating RN: Curtis Sites Primary Care Physician: Sherrie Mustache Other Clinician: Referring Physician: Sherrie Mustache Treating Physician/Extender: Linwood Dibbles, HOYT Weeks in Treatment: 26 Active Problems Location of Pain Severity and Description of Pain Patient Has Paino No Site Locations Pain Management and Medication Current Pain Management: Notes Topical or injectable lidocaine is offered to patient for acute pain when surgical debridement is performed. If needed, Patient is instructed to use over the counter pain medication for the following 24-48 hours after debridement. Wound care MDs do not prescribed pain medications. Patient has chronic pain or uncontrolled pain. Patient has been instructed to make an appointment with their  Primary Care Physician for pain management Electronic Signature(s) Signed: 06/22/2016 5:44:00 PM By: Curtis Sites Entered By: Curtis Sites on 06/22/2016 12:59:39 Schwartz, [name] ([id number]) -------------------------------------------------------------------------------- Patient/Caregiver Education Details Patient Name: Pribyl, Anaja L. Date of Service: 06/22/2016 12:45 PM Medical Record Number: [id number] Patient Account Number: [id number] Date of Birth/Gender: Dec 10, 1955 (60 y.o. Female) Treating RN: Curtis Sites Primary Care Physician: Sherrie Mustache Other Clinician: Referring Physician: Sherrie Mustache Treating Physician/Extender: Skeet Simmer in Treatment: 26 Education Assessment Education Provided To: Patient Education Topics Provided Wound/Skin Impairment: Handouts: Other: have HHRN call [facility] for orders Methods: Explain/Verbal, Printed Responses: State content correctly Electronic Signature(s) Signed: 06/22/2016 5:44:00 PM By: Curtis Sites Entered By: Curtis Sites on 06/22/2016 14:55:43 Doddridge, [name] ([id number]) -------------------------------------------------------------------------------- Wound Assessment Details Patient Name: Udell, Zuha L. Date of Service: 06/22/2016 12:45 PM Medical Record Number: [id number] Patient Account Number: [id number] Date of Birth/Sex: August 31, 1955 (60 y.o. Female) Treating RN: Curtis Sites Primary Care Physician: Sherrie Mustache Other Clinician: Referring Physician: Sherrie Mustache Treating Physician/Extender: Linwood Dibbles, HOYT Weeks in Treatment: 26 Wound Status Wound Number: 1 Primary Etiology: Venous Leg Ulcer Wound Location: Right Lower Leg - Medial Wound Status: Open Wounding Event: Blister Date Acquired: 12/15/2015 Weeks Of Treatment: 26 Clustered Wound: No Photos Wound Measurements Length: (cm) 0.2 Width: (cm) 0.2 Depth: (cm) 0.1 Area: (cm) 0.031 Volume: (cm) 0.003 % Reduction in Area:  99.9% % Reduction in Volume: 99.9% Epithelialization: Large (67-100%) Tunneling: No Undermining: No Wound Description  Classification: Partial Thickness Wound Margin: Flat and Intact Exudate Amount: Small Exudate Type: Sanguinous Exudate Color: red Foul Odor After Cleansing: No Wound Bed Granulation Amount: Large (67-100%) Exposed Structure Granulation Quality: Red Fascia Exposed: No Necrotic Amount: None Present (0%) Fat Layer Exposed: No Tendon Exposed: No Muscle Exposed: No Sherk, Ahniya L. ([id number]) Joint Exposed: No Bone Exposed: No Limited to Skin Breakdown Periwound Skin Texture Texture Color No Abnormalities Noted: No No Abnormalities Noted: No Callus: No Atrophie Blanche: No Crepitus: No Cyanosis: No Excoriation: No Ecchymosis: No Fluctuance: No Erythema: No Friable: No Hemosiderin Staining: No Induration: No Mottled: No Localized Edema: No Pallor: No Rash: No Rubor: No Scarring: No Temperature / Pain Moisture Temperature: No Abnormality No Abnormalities Noted: No Dry / Scaly: No Maceration: No Moist: No Wound Preparation Ulcer Cleansing: Other: soap and water, Topical Anesthetic Applied: Other: lidocaine 4%, Treatment Notes Wound #1 (Right, Medial Lower Leg) 1. Cleansed with: Cleanse wound with antibacterial soap and water 2. Anesthetic Topical Lidocaine 4% cream to wound bed prior to debridement 4. Dressing Applied: Promogran 5. Secondary Dressing Applied ABD Pad 7. Secured with 4 Layer Compression System - Bilateral Electronic Signature(s) Signed: 06/22/2016 5:44:00 PM By: Curtis Sites Entered By: Curtis Sites on 06/22/2016 13:50:10 Hogans, [name] ([id number]) -------------------------------------------------------------------------------- Wound Assessment Details Patient Name: Brueckner, Antia L. Date of Service: 06/22/2016 12:45 PM Medical Record Number: [id number] Patient Account Number: [id number] Date of Birth/Sex:  Jan 14, 1956 (60 y.o. Female) Treating RN: Curtis Sites Primary Care Physician: Sherrie Mustache Other Clinician: Referring Physician: Sherrie Mustache Treating Physician/Extender: Linwood Dibbles, HOYT Weeks in Treatment: 26 Wound Status Wound Number: 2 Primary Etiology: Lymphedema Wound Location: Left Lower Leg - Posterior Wound Status: Open Wounding Event: Gradually Appeared Date Acquired: 04/19/2016 Weeks Of Treatment: 7 Clustered Wound: Yes Photos Wound Measurements Length: (cm) 0.1 Width: (cm) 0.1 Depth: (cm) 0.1 Area: (cm) 0.008 Volume: (cm) 0.001 % Reduction in Area: 99.1% % Reduction in Volume: 98.8% Epithelialization: Large (67-100%) Tunneling: No Undermining: No Wound Description Classification: Partial Thickness Wound Margin: Flat and Intact Exudate Amount: Small Exudate Type: Sanguinous Exudate Color: red Foul Odor After Cleansing: No Wound Bed Granulation Amount: Large (67-100%) Exposed Structure Granulation Quality: Red Fascia Exposed: No Necrotic Amount: None Present (0%) Fat Layer Exposed: No Tendon Exposed: No Muscle Exposed: No Tuley, Rosilyn L. ([id number]) Joint Exposed: No Bone Exposed: No Limited to Skin Breakdown Periwound Skin Texture Texture Color No Abnormalities Noted: No No Abnormalities Noted: No Callus: No Atrophie Blanche: No Crepitus: No Cyanosis: No Excoriation: No Ecchymosis: No Fluctuance: No Erythema: No Friable: No Hemosiderin Staining: No Induration: No Mottled: No Localized Edema: No Pallor: No Rash: No Rubor: No Scarring: No Temperature / Pain Moisture Tenderness on Palpation: Yes No Abnormalities Noted: No Dry / Scaly: No Maceration: No Moist: No Wound Preparation Ulcer Cleansing: Other: soap and water, Treatment Notes Wound #2 (Left, Posterior Lower Leg) 1. Cleansed with: Cleanse wound with antibacterial soap and water 2. Anesthetic Topical Lidocaine 4% cream to wound bed prior to debridement 4.  Dressing Applied: Promogran 5. Secondary Dressing Applied ABD Pad 7. Secured with 4 Layer Compression System - Bilateral Electronic Signature(s) Signed: 06/22/2016 5:44:00 PM By: Curtis Sites Entered By: Curtis Sites on 06/22/2016 13:51:35 Neidert, [name] ([id number]) -------------------------------------------------------------------------------- Wound Assessment Details Patient Name: Lites, Mylisa L. Date of Service: 06/22/2016 12:45 PM Medical Record Number: [id number] Patient Account Number: [id number] Date of Birth/Sex: 1956-01-31 (60 y.o. Female) Treating RN: Curtis Sites Primary Care Physician: Sherrie Mustache Other Clinician: Referring Physician: Sherrie Mustache Treating  Physician/Extender: Linwood Dibbles, HOYT Weeks in Treatment: 26 Wound Status Wound Number: 3 Primary Etiology: Venous Leg Ulcer Wound Location: Right Lower Leg - Medial, Wound Status: Healed - Epithelialized Proximal Wounding Event: Blister Date Acquired: 05/18/2016 Weeks Of Treatment: 5 Clustered Wound: No Photos Wound Measurements Length: (cm) 0 % Reduction i Width: (cm) 0 % Reduction i Depth: (cm) 0 Epithelializa Area: (cm) 0 Tunneling: Volume: (cm) 0 Undermining: n Area: 100% n Volume: 100% tion: Large (67-100%) No No Wound Description Full Thickness Without Exposed Classification: Support Structures Wound Margin: Flat and Intact Exudate None Present Amount: Wound Bed Granulation Amount: None Present (0%) Necrotic Amount: None Present (0%) Periwound Skin Texture Texture Color Gullett, Jamiee L. ([id number]) No Abnormalities Noted: No No Abnormalities Noted: No Moisture No Abnormalities Noted: No Wound Preparation Ulcer Cleansing: Other: soap anw water, Topical Anesthetic Applied: None Electronic Signature(s) Signed: 06/22/2016 5:44:00 PM By: Curtis Sites Entered By: Curtis Sites on 06/22/2016 13:52:25 Cocking, Rhylan Elbert Ewings  ([id number]) -------------------------------------------------------------------------------- Wound Assessment Details Patient Name: Shroff, Kahealani L. Date of Service: 06/22/2016 12:45 PM Medical Record Number: [id number] Patient Account Number: [id number] Date of Birth/Sex: 1955/12/20 (60 y.o. Female) Treating RN: Curtis Sites Primary Care Physician: Sherrie Mustache Other Clinician: Referring Physician: Sherrie Mustache Treating Physician/Extender: Linwood Dibbles, HOYT Weeks in Treatment: 26 Wound Status Wound Number: 6 Primary Etiology: Venous Leg Ulcer Wound Location: Right Lower Leg - Anterior Wound Status: Open Wounding Event: Gradually Appeared Date Acquired: 06/11/2016 Weeks Of Treatment: 1 Clustered Wound: No Photos Wound Measurements Length: (cm) 0.2 Width: (cm) 0.2 Depth: (cm) 0.1 Area: (cm) 0.031 Volume: (cm) 0.003 % Reduction in Area: 97.1% % Reduction in Volume: 97.2% Epithelialization: Large (67-100%) Tunneling: No Undermining: No Wound Description Classification: Partial Thickness Wound Margin: Flat and Intact Exudate Amount: Medium Exudate Type: Sanguinous Exudate Color: red Foul Odor After Cleansing: No Wound Bed Granulation Amount: Large (67-100%) Exposed Structure Granulation Quality: Red Fascia Exposed: No Necrotic Amount: None Present (0%) Fat Layer Exposed: No Tendon Exposed: No Muscle Exposed: No Righter, Jeanene L. ([id number]) Joint Exposed: No Bone Exposed: No Limited to Skin Breakdown Periwound Skin Texture Texture Color No Abnormalities Noted: No No Abnormalities Noted: No Callus: No Atrophie Blanche: No Crepitus: No Cyanosis: No Excoriation: No Ecchymosis: No Fluctuance: No Erythema: No Friable: No Hemosiderin Staining: No Induration: No Mottled: No Localized Edema: No Pallor: No Rash: No Rubor: No Scarring: No Temperature / Pain Moisture Tenderness on Palpation: Yes No Abnormalities Noted: No Dry / Scaly:  No Maceration: No Moist: No Wound Preparation Ulcer Cleansing: Other: soap and water, Topical Anesthetic Applied: None Treatment Notes Wound #6 (Right, Anterior Lower Leg) 1. Cleansed with: Cleanse wound with antibacterial soap and water 2. Anesthetic Topical Lidocaine 4% cream to wound bed prior to debridement 4. Dressing Applied: Promogran 5. Secondary Dressing Applied ABD Pad 7. Secured with 4 Layer Compression System - Bilateral Electronic Signature(s) Signed: 06/22/2016 5:44:00 PM By: Curtis Sites Entered By: Curtis Sites on 06/22/2016 13:53:08 Cessna, [name] ([id number]) -------------------------------------------------------------------------------- Vitals Details Patient Name: Firebaugh, Natasha L. Date of Service: 06/22/2016 12:45 PM Medical Record Number: [id number] Patient Account Number: [id number] Date of Birth/Sex: [birth year] (60 y.o. Female) Treating RN: Curtis Sites Primary Care Physician: Sherrie Mustache Other Clinician: Referring Physician: Sherrie Mustache Treating Physician/Extender: Linwood Dibbles, HOYT Weeks in Treatment: 26 Vital Signs Time Taken: 13:00 Temperature (F): 98.5 Height (in): 62 Pulse (bpm): 95 Weight (lbs): 170 Respiratory Rate (breaths/min): 20 Body Mass Index (BMI): 31.1 Blood Pressure (mmHg): 137/64 Reference Range: 80 - 120 mg / dl  Electronic Signature(s) Signed: 06/22/2016 5:44:00 PM By: Curtis Sites Entered By: Curtis Sites on 06/22/2016 13:00:07

## 2016-06-29 ENCOUNTER — Encounter: Payer: Medicare Other | Admitting: Internal Medicine

## 2016-06-29 DIAGNOSIS — I87333 Chronic venous hypertension (idiopathic) with ulcer and inflammation of bilateral lower extremity: Secondary | ICD-10-CM | POA: Diagnosis not present

## 2016-06-30 NOTE — Progress Notes (Addendum)
[name], [name] ([id number]) Visit Report for 06/29/2016 Chief Complaint Document Details Epping, [name] Date of Service: 06/29/2016 12:45 PM Patient Name: [name] Account Number: [id number] Medical Record Treating RN: Curtis Sites [id number] Number: Other Clinician: 09/21/1955 (60 y.o. Treating [name] Date of Birth/Sex: Female) Physician/Extender: G Primary Care [name], [facility] Physician: Referring Physician: Sherrie Mustache Weeks in Treatment: 48 Information Obtained from: Patient Chief Complaint Patient is here for follow-up regarding her bilateral lower extremity venous stasis ulcers Electronic Signature(s) Signed: 06/29/2016 4:42:06 PM By: [name] Entered By: Baltazar Najjar on 06/29/2016 13:25:09 [name] ([id number]) -------------------------------------------------------------------------------- HPI Details [name] Date of Service: 06/29/2016 12:45 PM Patient Name: [name] Account Number: [id number] Medical Record Treating RN: Curtis Sites [id number] Number: Other Clinician: 09/17/1955 (60 y.o. Treating [name] Date of Birth/Sex: Female) Physician/Extender: G Primary Care [name], [facility] Physician: Referring Physician: Sherrie Mustache Weeks in Treatment: 54 History of Present Illness HPI Description: 12/23/15; this is a 60 year old lady who recently relocated back to [place] from [place]. She is staying with a family friend previously lived with a daughter in [place]. 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 She is on chronic prednisone she is not a diabetic. Dose of prednisone is 10 mg. The patient tells me that roughly a week ago she developed a fairly substantial blister over this area that then ruptured. She was seen in the emergency room on 5/7 an x-ray of the leg was negative she was put on Septra although I don't think any cultures  were done. She also tells me she has had chronic edema in her legs for a number of years. She had a similar presentation to currently in the right lateral lower leg roughly a year ago that she was able to heal on her own. As noted she has systemic lupus but does not have lupus nephritis according to the patient. She has a lot of edema in her lower legs. The ABI's could not be obtained. 12/31/15; the patient's insurance which is [place] makes her out of network for any home health therefore we have been changing her dressing in our facility. I reviewed her trip to the ER on 12/21/2015 her creatinine was within the normal range hemoglobin and white count normal. Culture of the wound was negative. X-ray of the leg showed soft tissue edema no foreign bodies. We have been dressing this with Aquacel Ag due to the amount of ongoing drainage 01/07/16; the patient had her arterial studies this morning I don't have these results area she comes back in to have [place] rewrap since she doesn't have access to home health 01/14/16 I still do not have the results of her arterial studies. Our nurse correctly pointed out that we've been wrapping the left leg without an open wound I think this had to do with the fact that she had so much edema when she came in I was concerned about leaving this unwrapped. 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 We left her left leg unwrapped last week as she has no open wound here today she has extensive edema here. We did order stockings however I believe the company called but they have not called them back. She comes back in today with extensive edema in the left leg. We have her arterial studies  which showed triphasic waves and all locations tested including her posterior tibial and anterior tibial arteries bilaterally. Report listed no hemodynamically  significant bilateral lower extremity arterial stenosis. She has her venous studies later this month. I'm going to rewrap the left leg due to the extent of the edema, transitioning her into the stocking we ordered last week as soon as one is available 01/27/16 the patient arrived today with pooled serosanguineous drainage over the wound it would not absorb into her Aquacel Ag which is somewhat on. In spite of this the dimensions of her wound are down Heymann, [name] ([id number]) considerably and the wound bed looks healthy without any need for debridement. She is obtained her juxtalite stockings the left leg which has no open wound. We are waiting for the right leg wound to get smaller before ordering her one for the right leg 02/03/16; patient wound looked improved. No debridement was required. We've been using the juxtalite stocking on the left leg which is no open wound. 02/11/16 wound is remarkably better. No debridement was required. Healthy rim of epithelialization. We have been using Aquacel Ag, we'll try to close this down with RTD over the next 2 weeks 02/18/16 wound continues to improve down a centimeter in length and width. No debridement was required. Advancing epithelialization. We started RTD last week 02/25/16: pt brought juxtalite for right lower leg today. nurse reports RTD was dry and adhered to the wound. required removal by NS. wound bed with 100% healthy red granulation tissue. denies systemic s/s of infection. 03/02/16; Considerable improvement. no debridement. 03/09/16; wound bed is very vascular no major change in dimensions. Continuing with RTD 03/16/16; unfortunately no major change in dimensions however the wound bed looks very healthy. I will continue with RTD for one more week. If his stalls consider change to collagen. 03/30/2016 -- she's been using juxta lites on her left lower extremity but her lymphedema is very significantly increased and she is not tolerating the  juxta lites the edema has increased symptoms significantly 04/06/16; apparently the RTD started sticking to the wound therefore she was changed to sorbact. Her 4-layer wrap appears to have slipped one third of the way down her leg. She was tolerating the juxtalite on the left leg, I don't see the need to wrap the left leg. She is not a candidate for external compression pumps secondary to insurance issues. 04/13/16; wound is actually measuring larger. Has been using Sorbact 04/20/16; although the wound dimensions of really not changed all that much, the better this certainly looks healthy and there is advancing epithelialization from both sides. No evidence of infection 04/27/16 changed to [facility] last week and the dimensions appear to be a lot better. No evidence of infection. She does not put the juxtalite stocking on the left leg on correctly nevertheless she has never had an open wound here. I am really uncertain whether we ever got 2 to juxtalite stockings for her. The wound started as a blister 05/04/16; finally with Hydrofera Blue last week the area on the right leg has closed over. However in inspection of her left leg she now has a small wound on the posterior aspect of the left leg. She has been using juxta lites on this area but probably not really applying them correctly and with enough tension. She is not a candidate for compression pumps secondary to insurance issues 05/11/16; the area on her right anterior leg and closed over last week however we noted an area on her  left posterior calf. I elected to wrap both her legs in anticipation we be able to transition her to juxtalite stockings. She arrives today the wraps and fallen down on both sides. The area on the right anterior leg has reopened she also had a very tense blister on the right anterior lateral leg just above her wound which I elected to excise. The area on the posterior left leg is roughly about the same 05/18/16  patient presents today for a follow-up concerning her right and left lower extremity venous stasis ulcers. Currently her wraps which are 4-layer compression have been sliding down due to the amount of swelling that she has. This is causing stasis above the wraps were really does not need to be and subsequently leading to her having blistering especially on the left above where the wrap is. In fact she has a large wound in this area that was just blistered last week and was not nearly as bad as what it is right now. Unfortunately despite what we have tried with the compression at this point I feel that we are almost being counterproductive in this respect. She tells me that her pain as a 9 out of 10 at this point in time. this pain is worse with manipulation of the wound including cleaning as well as just touching. 05/25/16 patient presents today for follow-up unfortunately her bilateral lower extremities actually Wrigley, [name] ([id number]) significantly more edematous throughout. Last visit her wraps that actually slipped down which had been the course of things of the past several visits. For that reason we actually with light of compression to hopefully make this more uniform. Unfortunately the Kerlex and Coban compression at this point in time appears to have been suboptimal. she continues to have a significant amount of swelling in bilateral lower extremities and unfortunately had several new wounds which have surfaced of the right lower extremity at this point in time. Again venous in nature. She tells me that she does "elevate her legs" as much as she can. She notes that her legs felt tight and she relates this to be may be a 8 out of 10 at this point in time but is not having as much discomfort as last week. she is on Lasix currently 20 mg. 06/01/16; apparently over the last week or 2 the patient is developed a lot more edema in her right greater than left leg. This is resulted in  substantial increase in the wounds in the right leg to a lesser extent the left leg. For reasons that are unclear she is also had reduction in the wraps she is wearing down down to a 2 layer wrap. She has a appointment with her primary physician tomorrow, currently using 40 mg of Lasix a day. The patient has a history of lupus which apparently was systemic. She also has a history of hepatitis C. Somebody has done an ultrasound with a history of a PET hepatitis C elevated LFTs. This is labeled Hepatic Elastography,. Liver is noted to have fatty infiltration no focal abnormality. Her IVC had no abnormality visualized. She did not have ascites. Chronic medical renal disease. 06/11/2016 -- the patient was recently admitted to hospital for extensive lower extremity lymphedema and was worked up and discharged with Unna boots and these were extremely tight for her and she came in for an appointment today. She has been put on new medications as per her discharge summary. 06/15/16 patient presents for evaluation today concerning bilateral lower extremity lymphedema and  venous hypertension which is chronic. At this point in time she seems to be doing somewhat better than her the last evaluation. She tells me that she is not having significant discomfort at this time fortunately. she has no interval signs or symptoms of infection at this point and we have been using Aquacel Ag on her wounds currently. She is in a 4-layer compression at this point 06/22/16 [name] actually is doing welll at this point in time and regarding her lower extremity wounds. She is tolerating the compression wraps at this point. Several the woounds have actually healed and looks significantly better at this point in time. She really only has 2 wounds still open currently. 06/29/16; the patient's wounds have actually healed over. I think this is a combination of chronic venous insufficiency and secondary lymphedema. She is been  wearing loose Profore is [4 layers]. There are no open wounds. She has bilateral juxtalite stockings at home where she lives with a friend. Previously the friend was applying the juxtalite's to the left leg low she was not really applying them snugly enough Electronic Signature(s) Signed: 06/29/2016 4:42:06 PM By: [name] Entered By: Baltazar Najjar on 06/29/2016 13:26:37 [name], [name] ([id number]) -------------------------------------------------------------------------------- Physical Exam Details [name] Date of Service: 06/29/2016 12:45 PM Patient Name: [name] Account Number: [id number] Medical Record Treating RN: Curtis Sites [id number] Number: Other Clinician: 07/03/1956 (60 y.o. Treating Zauria Dombek Date of Birth/Sex: Female) Physician/Extender: G Primary Care [name], [facility] Physician: Referring Physician: Sherrie Mustache Weeks in Treatment: 27 Constitutional Sitting or standing Blood Pressure is within target range for patient.. Pulse regular and within target range for patient.Marland Kitchen Respirations regular, non-labored and within target range.. Temperature is normal and within the target range for the patient.. Patient's appearance is neat and clean. Appears in no acute distress. Well nourished and well developed.. Cardiovascular Pedal pulses palpable and strong bilaterally.. Edema present in both extremities. This is slightly pitting especially in the right leg. There is hemosiderin deposition on the right. Lymphatic Nonpalpable in the popliteal or inguinal areas. Psychiatric No evidence of depression, anxiety, or agitation. Calm, cooperative, and communicative. Appropriate interactions and affect.. Notes Wound exam; there are no open wounds in either leg. After careful discussion we have decided to replace her Profore wraps, bring her juxtalite stockings next week along with her friend who will apply them. Electronic Signature(s) Signed:  06/29/2016 4:42:06 PM By: [name] Entered By: Baltazar Najjar on 06/29/2016 13:28:11 [name], [name] ([id number]) -------------------------------------------------------------------------------- Physician Orders Details [name] Date of Service: 06/29/2016 12:45 PM Patient Name: [name] Account Number: [id number] Medical Record Treating RN: Curtis Sites [id number] Number: Other Clinician: [birth year] (59 y.o. Treating Nesa Distel Date of Birth/Sex: Female) Physician/Extender: G Primary Care [name], [facility] Physician: Referring Physician: Sherrie Mustache Weeks in Treatment: 41 Verbal / Phone Orders: Yes Clinician: Curtis Sites Read Back and Verified: Yes Diagnosis Coding Wound Cleansing Wound #1 Right,Medial Lower Leg o Cleanse wound with mild soap and water Wound #2 Left,Posterior Lower Leg o Cleanse wound with mild soap and water Wound #6 Right,Anterior Lower Leg o Cleanse wound with mild soap and water Skin Barriers/Peri-Wound Care Wound #1 Right,Medial Lower Leg o Moisturizing lotion - apply liberally to both lower legs before applying wraps Wound #2 Left,Posterior Lower Leg o Moisturizing lotion - apply liberally to both lower legs before applying wraps Wound #6 Right,Anterior Lower Leg o Moisturizing lotion - apply liberally to both lower legs before applying wraps Primary Wound Dressing Wound #1 Right,Medial  Lower Leg o Promogran - if anything is draining - currently no wounds are draining Wound #2 Left,Posterior Lower Leg o Promogran - if anything is draining - currently no wounds are draining Wound #6 Right,Anterior Lower Leg o Promogran - if anything is draining - currently no wounds are draining Dressing Change Frequency Wound #1 Right,Medial Lower Leg o Three times weekly Knaggs, [name] ([id number]) Wound #2 Left,Posterior Lower Leg o Three times weekly Wound #6 Right,Anterior Lower Leg o Three times  weekly Follow-up Appointments Wound #1 Right,Medial Lower Leg o Return Appointment in 1 week. Wound #2 Left,Posterior Lower Leg o Return Appointment in 1 week. Wound #6 Right,Anterior Lower Leg o Return Appointment in 1 week. Edema Control Wound #1 Right,Medial Lower Leg o 4 Layer Compression System - Bilateral - wrapped lightly Wound #2 Left,Posterior Lower Leg o 4 Layer Compression System - Bilateral - wrapped lightly Wound #6 Right,Anterior Lower Leg o 4 Layer Compression System - Bilateral - wrapped lightly Additional Orders / Instructions Wound #1 Right,Medial Lower Leg o Increase protein intake. Wound #2 Left,Posterior Lower Leg o Increase protein intake. Wound #6 Right,Anterior Lower Leg o Increase protein intake. Home Health Wound #1 Right,Medial Lower Leg o Continue Home Health Visits - [facility] please instruct patient's caregiver in juxtalite application o Home Health Nurse may visit PRN to address patientos wound care needs. o FACE TO FACE ENCOUNTER: MEDICARE and MEDICAID PATIENTS: I certify that this patient is under my care and that I had a face-to-face encounter that meets the physician face-to-face encounter requirements with this patient on this date. The encounter with the patient was in whole or in part for the following MEDICAL CONDITION: (primary reason for Home Healthcare) MEDICAL NECESSITY: I certify, that based on my findings, NURSING services are a medically necessary home health service. HOME BOUND STATUS: I certify that my clinical findings support that this patient is homebound (i.e., Due to illness or injury, pt requires aid of Carey, JENNESS JABLONOWSKI. ([id number]) supportive devices such as crutches, cane, wheelchairs, walkers, the use of special transportation or the assistance of another person to leave their place of residence. There is a normal inability to leave the home and doing so requires considerable and taxing effort.  Other absences are for medical reasons / religious services and are infrequent or of short duration when for other reasons). o If current dressing causes regression in wound condition, may D/C ordered dressing product/s and apply Normal Saline Moist Dressing daily until next Wound Healing Center / Other MD appointment. Notify Wound Healing Center of regression in wound condition at [phone]. o Please direct any NON-WOUND related issues/requests for orders to patient's Primary Care Physician Wound #2 Left,Posterior Lower Leg o Continue Home Health Visits - [facility] please instruct patient's caregiver in juxtalite application o Home Health Nurse may visit PRN to address patientos wound care needs. o FACE TO FACE ENCOUNTER: MEDICARE and MEDICAID PATIENTS: I certify that this patient is under my care and that I had a face-to-face encounter that meets the physician face-to-face encounter requirements with this patient on this date. The encounter with the patient was in whole or in part for the following MEDICAL CONDITION: (primary reason for Home Healthcare) MEDICAL NECESSITY: I certify, that based on my findings, NURSING services are a medically necessary home health service. HOME BOUND STATUS: I certify that my clinical findings support that this patient is homebound (i.e., Due to illness or injury, pt requires aid of supportive devices such as crutches, cane, wheelchairs, walkers, the  use of special transportation or the assistance of another person to leave their place of residence. There is a normal inability to leave the home and doing so requires considerable and taxing effort. Other absences are for medical reasons / religious services and are infrequent or of short duration when for other reasons). o If current dressing causes regression in wound condition, may D/C ordered dressing product/s and apply Normal Saline Moist Dressing daily until next Wound Healing Center / Other  MD appointment. Notify Wound Healing Center of regression in wound condition at [phone]. o Please direct any NON-WOUND related issues/requests for orders to patient's Primary Care Physician Wound #6 Right,Anterior Lower Leg o Continue Home Health Visits - [facility] please instruct patient's caregiver in juxtalite application o Home Health Nurse may visit PRN to address patientos wound care needs. o FACE TO FACE ENCOUNTER: MEDICARE and MEDICAID PATIENTS: I certify that this patient is under my care and that I had a face-to-face encounter that meets the physician face-to-face encounter requirements with this patient on this date. The encounter with the patient was in whole or in part for the following MEDICAL CONDITION: (primary reason for Home Healthcare) MEDICAL NECESSITY: I certify, that based on my findings, NURSING services are a medically necessary home health service. HOME BOUND STATUS: I certify that my clinical findings support that this patient is homebound (i.e., Due to illness or injury, pt requires aid of supportive devices such as crutches, cane, wheelchairs, walkers, the use of special transportation or the assistance of another person to leave their place of residence. There is a normal inability to leave the home and doing so requires considerable and taxing effort. Other absences are for medical reasons / religious services and are infrequent or of short duration when for other reasons). o If current dressing causes regression in wound condition, may D/C ordered dressing product/s and apply Normal Saline Moist Dressing daily until next Wound Healing Center / Other MD appointment. Notify Wound Healing Center of regression in wound condition at [phone]. TEONA, VARGUS ([id number]) o Please direct any NON-WOUND related issues/requests for orders to patient's Primary Care Physician Electronic Signature(s) Signed: 06/29/2016 4:35:41 PM By: Curtis Sites Signed: 06/29/2016 4:42:06 PM By: [name] Entered By: Curtis Sites on 06/29/2016 13:22:00 Sieg, [name] ([id number]) -------------------------------------------------------------------------------- Problem List Details [name] Date of Service: 06/29/2016 12:45 PM Patient Name: [name] Account Number: [id number] Medical Record Treating RN: Curtis Sites [id number] Number: Other Clinician: January 23, 1956 (60 y.o. Treating Katalea Ucci Date of Birth/Sex: Female) Physician/Extender: G Primary Care [name], [facility] Physician: Referring Physician: Sherrie Mustache Weeks in Treatment: 27 Active Problems ICD-10 Encounter Code Description Active Date Diagnosis I87.331 Chronic venous hypertension (idiopathic) with ulcer and 12/23/2015 Yes inflammation of right lower extremity I87.322 Chronic venous hypertension (idiopathic) with 12/23/2015 Yes inflammation of left lower extremity L97.211 Non-pressure chronic ulcer of right calf limited to 03/02/2016 Yes breakdown of skin I89.0 Lymphedema, not elsewhere classified 06/01/2016 Yes M32.10 Systemic lupus erythematosus, organ or system 12/23/2015 Yes involvement unspecified Inactive Problems Resolved Problems Electronic Signature(s) Signed: 06/29/2016 4:42:06 PM By: [name] Entered By: Baltazar Najjar on 06/29/2016 13:24:51 Schweickert, [name] ([id number]) -------------------------------------------------------------------------------- Progress Note Details [name] Date of Service: 06/29/2016 12:45 PM Patient Name: [name] Account Number: [id number] Medical Record Treating RN: Curtis Sites [id number] Number: Other Clinician: 01-09-1956 (60 y.o. Treating Darline Faith Date of Birth/Sex: Female) Physician/Extender: G Primary Care [name], [facility] Physician: Referring Physician: Sherrie Mustache Weeks in Treatment: 46 Subjective Chief Complaint Information obtained  from Patient Patient  is here for follow-up regarding her bilateral lower extremity venous stasis ulcers History of Present Illness (HPI) 12/23/15; this is a 60 year old lady who recently relocated back to [place] from [place]. She is staying with a family friend previously lived with a daughter in [place]. 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 She is on chronic prednisone she is not a diabetic. Dose of prednisone is 10 mg. The patient tells me that roughly a week ago she developed a fairly substantial blister over this area that then ruptured. She was seen in the emergency room on 5/7 an x-ray of the leg was negative she was put on Septra although I don't think any cultures were done. She also tells me she has had chronic edema in her legs for a number of years. She had a similar presentation to currently in the right lateral lower leg roughly a year ago that she was able to heal on her own. As noted she has systemic lupus but does not have lupus nephritis according to the patient. She has a lot of edema in her lower legs. The ABI's could not be obtained. 12/31/15; the patient's insurance which is [place] makes her out of network for any home health therefore we have been changing her dressing in our facility. I reviewed her trip to the ER on 12/21/2015 her creatinine was within the normal range hemoglobin and white count normal. Culture of the wound was negative. X-ray of the leg showed soft tissue edema no foreign bodies. We have been dressing this with Aquacel Ag due to the amount of ongoing drainage 01/07/16; the patient had her arterial studies this morning I don't have these results area she comes back in to have [place] rewrap since she doesn't have access to home health 01/14/16 I still do not have the results of her arterial studies. Our nurse correctly pointed out that we've been wrapping the left leg without an open wound I think this had  to do with the fact that she had so much edema when she came in I was concerned about leaving this unwrapped. 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 We left her left leg unwrapped last week as she has no open wound here today she has extensive edema here. We did order stockings however I believe the company called but they have not called them back. She comes back in today with extensive edema in the left leg. We have her arterial studies which Paisley, Kairi L. ([id number]) showed triphasic waves and all locations tested including her posterior tibial and anterior tibial arteries bilaterally. Report listed no hemodynamically significant bilateral lower extremity arterial stenosis. She has her venous studies later this month. I'm going to rewrap the left leg due to the extent of the edema, transitioning her into the stocking we ordered last week as soon as one is available 01/27/16 the patient arrived today with pooled serosanguineous drainage over the wound it would not absorb into her Aquacel Ag which is somewhat on. In spite of this the dimensions of her wound are down considerably and the wound bed looks healthy without any need for debridement. She is obtained her juxtalite stockings the left leg which has no open wound. We are waiting for the right leg wound to get smaller  before ordering her one for the right leg 02/03/16; patient wound looked improved. No debridement was required. We've been using the juxtalite stocking on the left leg which is no open wound. 02/11/16 wound is remarkably better. No debridement was required. Healthy rim of epithelialization. We have been using Aquacel Ag, we'll try to close this down with RTD over the next 2 weeks 02/18/16 wound continues to improve down a centimeter in length and width. No debridement was required. Advancing  epithelialization. We started RTD last week 02/25/16: pt brought juxtalite for right lower leg today. nurse reports RTD was dry and adhered to the wound. required removal by NS. wound bed with 100% healthy red granulation tissue. denies systemic s/s of infection. 03/02/16; Considerable improvement. no debridement. 03/09/16; wound bed is very vascular no major change in dimensions. Continuing with RTD 03/16/16; unfortunately no major change in dimensions however the wound bed looks very healthy. I will continue with RTD for one more week. If his stalls consider change to collagen. 03/30/2016 -- she's been using juxta lites on her left lower extremity but her lymphedema is very significantly increased and she is not tolerating the juxta lites the edema has increased symptoms significantly 04/06/16; apparently the RTD started sticking to the wound therefore she was changed to sorbact. Her 4-layer wrap appears to have slipped one third of the way down her leg. She was tolerating the juxtalite on the left leg, I don't see the need to wrap the left leg. She is not a candidate for external compression pumps secondary to insurance issues. 04/13/16; wound is actually measuring larger. Has been using Sorbact 04/20/16; although the wound dimensions of really not changed all that much, the better this certainly looks healthy and there is advancing epithelialization from both sides. No evidence of infection 04/27/16 changed to [facility] last week and the dimensions appear to be a lot better. No evidence of infection. She does not put the juxtalite stocking on the left leg on correctly nevertheless she has never had an open wound here. I am really uncertain whether we ever got 2 to juxtalite stockings for her. The wound started as a blister 05/04/16; finally with Hydrofera Blue last week the area on the right leg has closed over. However in inspection of her left leg she now has a small wound on the posterior  aspect of the left leg. She has been using juxta lites on this area but probably not really applying them correctly and with enough tension. She is not a candidate for compression pumps secondary to insurance issues 05/11/16; the area on her right anterior leg and closed over last week however we noted an area on her left posterior calf. I elected to wrap both her legs in anticipation we be able to transition her to juxtalite stockings. She arrives today the wraps and fallen down on both sides. The area on the right anterior leg has reopened she also had a very tense blister on the right anterior lateral leg just above her wound which I elected to excise. The area on the posterior left leg is roughly about the same 05/18/16 patient presents today for a follow-up concerning her right and left lower extremity venous stasis ulcers. Currently her wraps which are 4-layer compression have been sliding down due to the amount of swelling that she has. This is causing stasis above the wraps were really does not need to be and subsequently leading to her having blistering especially on the left above where the  wrap is. In fact she has Massimo, Shaletta L. ([id number]) a large wound in this area that was just blistered last week and was not nearly as bad as what it is right now. Unfortunately despite what we have tried with the compression at this point I feel that we are almost being counterproductive in this respect. She tells me that her pain as a 9 out of 10 at this point in time. this pain is worse with manipulation of the wound including cleaning as well as just touching. 05/25/16 patient presents today for follow-up unfortunately her bilateral lower extremities actually significantly more edematous throughout. Last visit her wraps that actually slipped down which had been the course of things of the past several visits. For that reason we actually with light of compression to hopefully make this more  uniform. Unfortunately the Kerlex and Coban compression at this point in time appears to have been suboptimal. she continues to have a significant amount of swelling in bilateral lower extremities and unfortunately had several new wounds which have surfaced of the right lower extremity at this point in time. Again venous in nature. She tells me that she does "elevate her legs" as much as she can. She notes that her legs felt tight and she relates this to be may be a 8 out of 10 at this point in time but is not having as much discomfort as last week. she is on Lasix currently 20 mg. 06/01/16; apparently over the last week or 2 the patient is developed a lot more edema in her right greater than left leg. This is resulted in substantial increase in the wounds in the right leg to a lesser extent the left leg. For reasons that are unclear she is also had reduction in the wraps she is wearing down down to a 2 layer wrap. She has a appointment with her primary physician tomorrow, currently using 40 mg of Lasix a day. The patient has a history of lupus which apparently was systemic. She also has a history of hepatitis C. Somebody has done an ultrasound with a history of a PET hepatitis C elevated LFTs. This is labeled Hepatic Elastography,. Liver is noted to have fatty infiltration no focal abnormality. Her IVC had no abnormality visualized. She did not have ascites. Chronic medical renal disease. 06/11/2016 -- the patient was recently admitted to hospital for extensive lower extremity lymphedema and was worked up and discharged with Unna boots and these were extremely tight for her and she came in for an appointment today. She has been put on new medications as per her discharge summary. 06/15/16 patient presents for evaluation today concerning bilateral lower extremity lymphedema and venous hypertension which is chronic. At this point in time she seems to be doing somewhat better than her the  last evaluation. She tells me that she is not having significant discomfort at this time fortunately. she has no interval signs or symptoms of infection at this point and we have been using Aquacel Ag on her wounds currently. She is in a 4-layer compression at this point 06/22/16 Mrs. Okey DupreCrawford actually is doing welll at this point in time and regarding her lower extremity wounds. She is tolerating the compression wraps at this point. Several the woounds have actually healed and looks significantly better at this point in time. She really only has 2 wounds still open currently. 06/29/16; the patient's wounds have actually healed over. I think this is a combination of chronic venous insufficiency and secondary lymphedema.  She is been wearing loose Profore is [4 layers]. There are no open wounds. She has bilateral juxtalite stockings at home where she lives with a friend. Previously the friend was applying the juxtalite's to the left leg low she was not really applying them snugly enough Objective Constitutional Sitting or standing Blood Pressure is within target range for patient.. Pulse regular and within target range for patient.Marland Kitchen Respirations regular, non-labored and within target range.. Temperature is normal and within Petros, Kamyia L. ([id number]) the target range for the patient.. Patient's appearance is neat and clean. Appears in no acute distress. Well nourished and well developed.. Vitals Time Taken: 12:54 PM, Height: 62 in, Weight: 170 lbs, BMI: 31.1, Temperature: 98.9 F, Pulse: 97 bpm, Respiratory Rate: 20 breaths/min, Blood Pressure: 142/65 mmHg. Cardiovascular Pedal pulses palpable and strong bilaterally.. Edema present in both extremities. This is slightly pitting especially in the right leg. There is hemosiderin deposition on the right. Lymphatic Nonpalpable in the popliteal or inguinal areas. Psychiatric No evidence of depression, anxiety, or agitation. Calm, cooperative,  and communicative. Appropriate interactions and affect.. General Notes: Wound exam; there are no open wounds in either leg. After careful discussion we have decided to replace her Profore wraps, bring her juxtalite stockings next week along with her friend who will apply them. Integumentary (Hair, Skin) Wound #1 status is Open. Original cause of wound was Blister. The wound is located on the Right,Medial Lower Leg. The wound measures 0.1cm length x 0.1cm width x 0.1cm depth; 0.008cm^2 area and 0.001cm^3 volume. The wound is limited to skin breakdown. There is no tunneling or undermining noted. There is a small amount of sanguinous drainage noted. The wound margin is flat and intact. There is large (67-100%) red granulation within the wound bed. There is no necrotic tissue within the wound bed. The periwound skin appearance did not exhibit: Callus, Crepitus, Excoriation, Fluctuance, Friable, Induration, Localized Edema, Rash, Scarring, Dry/Scaly, Maceration, Moist, Atrophie Blanche, Cyanosis, Ecchymosis, Hemosiderin Staining, Mottled, Pallor, Rubor, Erythema. Periwound temperature was noted as No Abnormality. Wound #2 status is Open. Original cause of wound was Gradually Appeared. The wound is located on the Left,Posterior Lower Leg. The wound measures 0.1cm length x 0.1cm width x 0.1cm depth; 0.008cm^2 area and 0.001cm^3 volume. The wound is limited to skin breakdown. There is no tunneling or undermining noted. There is a small amount of sanguinous drainage noted. The wound margin is flat and intact. There is large (67-100%) red granulation within the wound bed. There is no necrotic tissue within the wound bed. The periwound skin appearance did not exhibit: Callus, Crepitus, Excoriation, Fluctuance, Friable, Induration, Localized Edema, Rash, Scarring, Dry/Scaly, Maceration, Moist, Atrophie Blanche, Cyanosis, Ecchymosis, Hemosiderin Staining, Mottled, Pallor, Rubor, Erythema. The periwound has  tenderness on palpation. Wound #6 status is Open. Original cause of wound was Gradually Appeared. The wound is located on the Right,Anterior Lower Leg. The wound measures 0.1cm length x 0.1cm width x 0.1cm depth; 0.008cm^2 area and 0.001cm^3 volume. The wound is limited to skin breakdown. There is no tunneling or undermining noted. There is a medium amount of sanguinous drainage noted. The wound margin is flat and intact. There is large (67-100%) red granulation within the wound bed. There is no necrotic tissue within the wound bed. The periwound skin appearance did not exhibit: Callus, Crepitus, Excoriation, Fluctuance, Friable, Induration, Localized Edema, Rash, Scarring, Dry/Scaly, Maceration, Moist, Atrophie Blanche, Cyanosis, Helfman, Ranette L. ([id number]) Ecchymosis, Hemosiderin Staining, Mottled, Pallor, Rubor, Erythema. The periwound has tenderness on palpation. Assessment Active Problems  ICD-10 I87.331 - 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 lower extremity I87.322 - Chronic venous hypertension (idiopathic) with inflammation of left lower extremity L97.211 - Non-pressure chronic ulcer of right calf limited to breakdown of skin I89.0 - Lymphedema, not elsewhere classified M32.10 - Systemic lupus erythematosus, organ or system involvement unspecified Plan Wound Cleansing: Wound #1 Right,Medial Lower Leg: Cleanse wound with mild soap and water Wound #2 Left,Posterior Lower Leg: Cleanse wound with mild soap and water Wound #6 Right,Anterior Lower Leg: Cleanse wound with mild soap and water Skin Barriers/Peri-Wound Care: Wound #1 Right,Medial Lower Leg: Moisturizing lotion - apply liberally to both lower legs before applying wraps Wound #2 Left,Posterior Lower Leg: Moisturizing lotion - apply liberally to both lower legs before applying wraps Wound #6 Right,Anterior Lower Leg: Moisturizing lotion - apply liberally to both lower legs  before applying wraps Primary Wound Dressing: Wound #1 Right,Medial Lower Leg: Promogran - if anything is draining - currently no wounds are draining Wound #2 Left,Posterior Lower Leg: Promogran - if anything is draining - currently no wounds are draining Wound #6 Right,Anterior Lower Leg: Promogran - if anything is draining - currently no wounds are draining Dressing Change Frequency: Wound #1 Right,Medial Lower Leg: Three times weekly Wound #2 Left,Posterior Lower Leg: Three times weekly [name], [name] ([id number]) Wound #6 Right,Anterior Lower Leg: Three times weekly Follow-up Appointments: Wound #1 Right,Medial Lower Leg: Return Appointment in 1 week. Wound #2 Left,Posterior Lower Leg: Return Appointment in 1 week. Wound #6 Right,Anterior Lower Leg: Return Appointment in 1 week. Edema Control: Wound #1 Right,Medial Lower Leg: 4 Layer Compression System - Bilateral - wrapped lightly Wound #2 Left,Posterior Lower Leg: 4 Layer Compression System - Bilateral - wrapped lightly Wound #6 Right,Anterior Lower Leg: 4 Layer Compression System - Bilateral - wrapped lightly Additional Orders / Instructions: Wound #1 Right,Medial Lower Leg: Increase protein intake. Wound #2 Left,Posterior Lower Leg: Increase protein intake. Wound #6 Right,Anterior Lower Leg: Increase protein intake. Home Health: Wound #1 Right,Medial Lower Leg: Continue Home Health Visits - [facility] please instruct patient's caregiver in juxtalite application Home Health Nurse may visit PRN to address patient s wound care needs. FACE TO FACE ENCOUNTER: MEDICARE and MEDICAID PATIENTS: I certify that this patient is under my care and that I had a face-to-face encounter that meets the physician face-to-face encounter requirements with this patient on this date. The encounter with the patient was in whole or in part for the following MEDICAL CONDITION: (primary reason for Home Healthcare) MEDICAL NECESSITY: I  certify, that based on my findings, NURSING services are a medically necessary home health service. HOME BOUND STATUS: I certify that my clinical findings support that this patient is homebound (i.e., Due to illness or injury, pt requires aid of supportive devices such as crutches, cane, wheelchairs, walkers, the use of special transportation or the assistance of another person to leave their place of residence. There is a normal inability to leave the home and doing so requires considerable and taxing effort. Other absences are for medical reasons / religious services and are infrequent or of short duration when for other reasons). If current dressing causes regression in wound condition, may D/C ordered dressing product/s and apply Normal Saline Moist Dressing daily until next Wound Healing Center / Other MD appointment. Notify Wound Healing Center of regression in wound condition at [phone]. Please direct any NON-WOUND related issues/requests for orders to patient's Primary Care Physician Wound #2 Left,Posterior Lower Leg: Continue Home Health Visits - [facility]  please instruct patient's caregiver in [facility] application Home Health Nurse may visit PRN to address patient s wound care needs. FACE TO FACE ENCOUNTER: MEDICARE and MEDICAID PATIENTS: I certify that this patient is under my care and that I had a face-to-face encounter that meets the physician face-to-face encounter requirements with this patient on this date. The encounter with the patient was in whole or in part for the following MEDICAL CONDITION: (primary reason for Home Healthcare) MEDICAL NECESSITY: I certify, that based on my findings, NURSING services are a medically necessary home health service. HOME BOUND STATUS: I certify that my clinical findings support that this patient is homebound (i.e., Due to illness or injury, pt requires aid of supportive devices such as crutches, cane, wheelchairs, walkers, the use Mauch,  Shylyn L. ([id number]) of special transportation or the assistance of another person to leave their place of residence. There is a normal inability to leave the home and doing so requires considerable and taxing effort. Other absences are for medical reasons / religious services and are infrequent or of short duration when for other reasons). If current dressing causes regression in wound condition, may D/C ordered dressing product/s and apply Normal Saline Moist Dressing daily until next Wound Healing Center / Other MD appointment. Notify Wound Healing Center of regression in wound condition at [phone]. Please direct any NON-WOUND related issues/requests for orders to patient's Primary Care Physician Wound #6 Right,Anterior Lower Leg: Continue Home Health Visits - [facility] please instruct patient's caregiver in juxtalite application Home Health Nurse may visit PRN to address patient s wound care needs. FACE TO FACE ENCOUNTER: MEDICARE and MEDICAID PATIENTS: I certify that this patient is under my care and that I had a face-to-face encounter that meets the physician face-to-face encounter requirements with this patient on this date. The encounter with the patient was in whole or in part for the following MEDICAL CONDITION: (primary reason for Home Healthcare) MEDICAL NECESSITY: I certify, that based on my findings, NURSING services are a medically necessary home health service. HOME BOUND STATUS: I certify that my clinical findings support that this patient is homebound (i.e., Due to illness or injury, pt requires aid of supportive devices such as crutches, cane, wheelchairs, walkers, the use of special transportation or the assistance of another person to leave their place of residence. There is a normal inability to leave the home and doing so requires considerable and taxing effort. Other absences are for medical reasons / religious services and are infrequent or of short duration when for  other reasons). If current dressing causes regression in wound condition, may D/C ordered dressing product/s and apply Normal Saline Moist Dressing daily until next Wound Healing Center / Other MD appointment. Notify Wound Healing Center of regression in wound condition at [phone]. Please direct any NON-WOUND related issues/requests for orders to patient's Primary Care Physician #1 all the wounds are closed over #2 reapplied the Profore thisWeek she is to bring her friend and the bilateral juxtalite's. We will go over the correct application of the juxtalite's and the tension that is necessary. My thought is that we can discharge her in 2 weeks if everything remains closed Electronic Signature(s) Signed: 06/30/2016 9:10:16 AM By: Elliot Gurney RN, [name], [name], [name] Signed: 06/30/2016 4:57:31 PM By: [name] Previous Signature: 06/29/2016 4:42:06 PM Version By: [name] Entered By: Elliot Gurney RN, [name], Kim on 06/30/2016 09:10:16 Burak, [name] ([id number]) -------------------------------------------------------------------------------- SuperBill Details Dobies, Aquita Date of Service: 06/29/2016 Patient Name: [name] Account  Number: [id number] Medical Record Treating RN: Curtis Sites [id number] Number: Other Clinician: [birth year] (60 y.o. Treating Rilda Bulls Date of Birth/Sex: Female) Physician/Extender: G Primary Care Weeks in Treatment: 41 [name], FAYEGH Physician: Referring Physician: Sherrie Mustache Diagnosis Coding ICD-10 Codes Code Description 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 lower I87.331 extremity I87.322 Chronic venous hypertension (idiopathic) with inflammation of left lower extremity L97.211 Non-pressure chronic ulcer of right calf limited to breakdown of skin I89.0 Lymphedema, not elsewhere classified M32.10 Systemic lupus erythematosus, organ or system involvement unspecified Facility  Procedures CPT4: Description Modifier Quantity Code [id number] BILATERAL: Application of multi-layer venous compression 1 system; leg (below knee), including ankle and foot. Physician Procedures CPT4: Description Modifier Quantity Code [id number] - WC PHYS LEVEL 3 - EST PT 1 ICD-10 Description Diagnosis I87.331 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 lower extremity L97.211 Non-pressure chronic ulcer of  right calf limited to breakdown of skin Electronic Signature(s) Signed: 06/29/2016 4:35:41 PM By: Curtis Sites Signed: 06/29/2016 4:42:06 PM By: [name] Entered By: Curtis Sites on 06/29/2016 13:40:15

## 2016-06-30 NOTE — Progress Notes (Signed)
Liane ComberCRAWFORD, Sanaiyah L. ([id number]) Visit Report for 06/29/2016 Arrival Information Details Patient Name: Liane ComberCRAWFORD, Tylesha L. Date of Service: 06/29/2016 12:45 PM Medical Record Patient Account Number: [id number] Number: Treating RN: Curtis SitesDorthy, Joanna 08/23/1955 (60 y.o. Other Clinician: Date of Birth/Sex: Female) Treating ROBSON, MICHAEL Primary Care Physician: Sherrie MustacheJADALI, FAYEGH Physician/Extender: G Referring Physician: Sherrie MustacheJADALI, FAYEGH Weeks in Treatment: 27 Visit Information History Since Last Visit Added or deleted any medications: No Patient Arrived: Walker Any new allergies or adverse reactions: No Arrival Time: 12:49 Had a fall or experienced change in No Accompanied By: self activities of daily living that may affect Transfer Assistance: None risk of falls: Patient Identification Verified: Yes Signs or symptoms of abuse/neglect since last No Secondary Verification Process Completed: Yes visito Patient Requires Transmission-Based No Hospitalized since last visit: No Precautions: Pain Present Now: No Patient Has Alerts: No Electronic Signature(s) Signed: 06/29/2016 4:35:41 PM By: Curtis Sitesorthy, Joanna Entered By: Curtis Sitesorthy, Joanna on 06/29/2016 12:53:48 Cottingham, [name] ([id number]) -------------------------------------------------------------------------------- Encounter Discharge Information Details Patient Name: Witte, Shynice L. Date of Service: 06/29/2016 12:45 PM Medical Record Patient Account Number: [id number] Number: Treating RN: Curtis SitesDorthy, Joanna 03/04/1956 (60 y.o. Other Clinician: Date of Birth/Sex: Female) Treating ROBSON, MICHAEL Primary Care Physician: Sherrie MustacheJADALI, FAYEGH Physician/Extender: G Referring Physician: Sherrie MustacheJADALI, FAYEGH Weeks in Treatment: 7427 Encounter Discharge Information Items Discharge Pain Level: 0 Discharge Condition: Stable Ambulatory Status: Walker Discharge Destination: Home Transportation: Private Auto Accompanied By:  self Schedule Follow-up Appointment: Yes Medication Reconciliation completed and provided to Patient/Care No Reeve Turnley: Provided on Clinical Summary of Care: 06/29/2016 Form Type Recipient Paper Patient GC Electronic Signature(s) Signed: 06/29/2016 1:43:26 PM By: Gwenlyn PerkingMoore, Shelia Previous Signature: 06/29/2016 1:13:58 PM Version By: Curtis Sitesorthy, Joanna Entered By: Gwenlyn PerkingMoore, Shelia on 06/29/2016 13:43:25 Colclough, [name] ([id number]) -------------------------------------------------------------------------------- Lower Extremity Assessment Details Patient Name: Gertsch, Concettina L. Date of Service: 06/29/2016 12:45 PM Medical Record Patient Account Number: [id number] Number: Treating RN: Curtis SitesDorthy, Joanna 11/22/1955 (60 y.o. Other Clinician: Date of Birth/Sex: Female) Treating ROBSON, MICHAEL Primary Care Physician: Sherrie MustacheJADALI, FAYEGH Physician/Extender: G Referring Physician: Sherrie MustacheJADALI, FAYEGH Weeks in Treatment: 27 Edema Assessment Assessed: [Left: No] [Right: No] Edema: [Left: Yes] [Right: Yes] Calf Left: Right: Point of Measurement: 31 cm From Medial Instep cm cm Ankle Left: Right: Point of Measurement: 11 cm From Medial Instep cm cm Vascular Assessment Pulses: Posterior Tibial Dorsalis Pedis Palpable: [Left:Yes] [Right:Yes] Extremity colors, hair growth, and conditions: Extremity Color: [Left:Hyperpigmented] [Right:Hyperpigmented] Hair Growth on Extremity: [Left:No] [Right:No] Temperature of Extremity: [Left:Warm] [Right:Warm] Capillary Refill: [Left:< 3 seconds] [Right:< 3 seconds] Electronic Signature(s) Signed: 06/29/2016 1:12:12 PM By: Curtis Sitesorthy, Joanna Entered By: Curtis Sitesorthy, Joanna on 06/29/2016 13:12:12 Neeb, [name] ([id number]) -------------------------------------------------------------------------------- Multi Wound Chart Details Patient Name: Samad, Minette L. Date of Service: 06/29/2016 12:45 PM Medical Record Patient Account Number:  [id number] Number: Treating RN: Curtis SitesDorthy, Joanna 10/24/1955 (60 y.o. Other Clinician: Date of Birth/Sex: Female) Treating ROBSON, MICHAEL Primary Care Physician: Sherrie MustacheJADALI, FAYEGH Physician/Extender: G Referring Physician: Sherrie MustacheJADALI, FAYEGH Weeks in Treatment: 27 Vital Signs Height(in): 62 Pulse(bpm): 97 Weight(lbs): 170 Blood Pressure 142/65 (mmHg): Body Mass Index(BMI): 31 Temperature(F): 98.9 Respiratory Rate 20 (breaths/min): Photos: [1:No Photos] [2:No Photos] [6:No Photos] Wound Location: [1:Right Lower Leg - Medial] [2:Left Lower Leg - Posterior] [6:Right Lower Leg - Anterior] Wounding Event: [1:Blister] [2:Gradually Appeared] [6:Gradually Appeared] Primary Etiology: [1:Venous Leg Ulcer] [2:Lymphedema] [6:Venous Leg Ulcer] Date Acquired: [1:12/15/2015] [2:04/19/2016] [6:06/11/2016] Weeks of Treatment: [1:27] [2:8] [6:2] Wound Status: [1:Open] [2:Open] [6:Open] Clustered Wound: [1:No] [2:Yes] [6:No] Measurements L x W x D 0.1x0.1x0.1 [2:0.1x0.1x0.1] [6:0.1x0.1x0.1] (  cm) Area (cm) : [1:0.008] [2:0.008] [6:0.008] Volume (cm) : [1:0.001] [2:0.001] [6:0.001] % Reduction in Area: [1:100.00%] [2:99.10%] [6:99.20%] % Reduction in Volume: 100.00% [2:98.80%] [6:99.10%] Classification: [1:Partial Thickness] [2:Partial Thickness] [6:Partial Thickness] Exudate Amount: [1:Small] [2:Small] [6:Medium] Exudate Type: [1:Sanguinous] [2:Sanguinous] [6:Sanguinous] Exudate Color: [1:red] [2:red] [6:red] Wound Margin: [1:Flat and Intact] [2:Flat and Intact] [6:Flat and Intact] Granulation Amount: [1:Large (67-100%)] [2:Large (67-100%)] [6:Large (67-100%)] Granulation Quality: [1:Red] [2:Red] [6:Red] Necrotic Amount: [1:None Present (0%)] [2:None Present (0%)] [6:None Present (0%)] Exposed Structures: [1:Fascia: No Fat: No Tendon: No Muscle: No Joint: No Bone: No] [2:Fascia: No Fat: No Tendon: No Muscle: No Joint: No Bone: No] [6:Fascia: No Fat: No Tendon: No Muscle: No Joint: No Bone:  No] Limited to Skin Limited to Skin Limited to Skin Breakdown Breakdown Breakdown Epithelialization: Large (67-100%) Large (67-100%) Large (67-100%) Periwound Skin Texture: Edema: No Edema: No Edema: No Excoriation: No Excoriation: No Excoriation: No Induration: No Induration: No Induration: No Callus: No Callus: No Callus: No Crepitus: No Crepitus: No Crepitus: No Fluctuance: No Fluctuance: No Fluctuance: No Friable: No Friable: No Friable: No Rash: No Rash: No Rash: No Scarring: No Scarring: No Scarring: No Periwound Skin Maceration: No Maceration: No Maceration: No Moisture: Moist: No Moist: No Moist: No Dry/Scaly: No Dry/Scaly: No Dry/Scaly: No Periwound Skin Color: Atrophie Blanche: No Atrophie Blanche: No Atrophie Blanche: No Cyanosis: No Cyanosis: No Cyanosis: No Ecchymosis: No Ecchymosis: No Ecchymosis: No Erythema: No Erythema: No Erythema: No Hemosiderin Staining: No Hemosiderin Staining: No Hemosiderin Staining: No Mottled: No Mottled: No Mottled: No Pallor: No Pallor: No Pallor: No Rubor: No Rubor: No Rubor: No Temperature: No Abnormality N/A N/A Tenderness on No Yes Yes Palpation: Wound Preparation: Ulcer Cleansing: Other: Ulcer Cleansing: Other: Ulcer Cleansing: Other: soap and water soap and water soap and water Topical Anesthetic Topical Anesthetic Topical Anesthetic Applied: None Applied: None Applied: None Treatment Notes Electronic Signature(s) Signed: 06/29/2016 1:13:40 PM By: Curtis Sites Entered By: Curtis Sites on 06/29/2016 13:13:40 Rossell, [name] ([id number]) -------------------------------------------------------------------------------- Multi-Disciplinary Care Plan Details Patient Name: Moorhouse, Nika L. Date of Service: 06/29/2016 12:45 PM Medical Record Patient Account Number: [id number] Number: Treating RN: Curtis Sites [birth year] (60 y.o. Other Clinician: Date of Birth/Sex: Female)  Treating ROBSON, MICHAEL Primary Care Physician: Sherrie Mustache Physician/Extender: G Referring Physician: Sherrie Mustache Weeks in Treatment: 56 Active Inactive Abuse / Safety / Falls / Self Care Management Nursing Diagnoses: Impaired physical mobility Potential for falls Goals: Patient will remain injury free Date Initiated: 12/23/2015 Goal Status: Active Interventions: Assess fall risk on admission and as needed Notes: Nutrition Nursing Diagnoses: Imbalanced nutrition Goals: Patient/caregiver verbalizes understanding of need to maintain therapeutic glucose control per primary care physician Date Initiated: 12/23/2015 Goal Status: Active Interventions: Provide education on nutrition Treatment Activities: Education provided on Nutrition : 01/21/2016 Notes: Orientation to the Wound Care Program KEILI, HASTEN ([id number]) Nursing Diagnoses: Knowledge deficit related to the wound healing center program Goals: Patient/caregiver will verbalize understanding of the Wound Healing Center Program Date Initiated: 12/23/2015 Goal Status: Active Interventions: Provide education on orientation to the wound center Notes: Venous Leg Ulcer Nursing Diagnoses: Potential for venous Insuffiency (use before diagnosis confirmed) Goals: Patient will maintain optimal edema control Date Initiated: 12/23/2015 Goal Status: Active Interventions: Provide education on venous insufficiency Notes: Wound/Skin Impairment Nursing Diagnoses: Impaired tissue integrity Goals: Ulcer/skin breakdown will heal within 14 weeks Date Initiated: 12/23/2015 Goal Status: Active Interventions: Assess ulceration(s) every visit Notes: Electronic Signature(s) Signed: 06/29/2016 1:13:32 PM By: Curtis Sites Entered By: Curtis Sites  on 06/29/2016 13:13:31 Mak, JOVONNE WOHLWEND ([id number]) Suppes, Jersi Elbert Ewings ([id number]) -------------------------------------------------------------------------------- Pain  Assessment Details Patient Name: Aitken, Diannie L. Date of Service: 06/29/2016 12:45 PM Medical Record Patient Account Number: [id number] Number: Treating RN: Curtis Sites [birth year] (60 y.o. Other Clinician: Date of Birth/Sex: Female) Treating ROBSON, MICHAEL Primary Care Physician: Sherrie Mustache Physician/Extender: G Referring Physician: Sherrie Mustache Weeks in Treatment: 27 Active Problems Location of Pain Severity and Description of Pain Patient Has Paino No Site Locations Pain Management and Medication Current Pain Management: Notes Topical or injectable lidocaine is offered to patient for acute pain when surgical debridement is performed. If needed, Patient is instructed to use over the counter pain medication for the following 24-48 hours after debridement. Wound care MDs do not prescribed pain medications. Patient has chronic pain or uncontrolled pain. Patient has been instructed to make an appointment with their Primary Care Physician for pain management. Electronic Signature(s) Signed: 06/29/2016 4:35:41 PM By: Curtis Sites Entered By: Curtis Sites on 06/29/2016 12:53:56 Bhola, [name] ([id number]) -------------------------------------------------------------------------------- Patient/Caregiver Education Details Patient Name: Darty, Rachana L. Date of Service: 06/29/2016 12:45 PM Medical Record Patient Account Number: [id number] Number: Treating RN: Curtis Sites 1956-01-07 (60 y.o. Other Clinician: Date of Birth/Gender: Female) Treating ROBSON, MICHAEL Primary Care Physician: Sherrie Mustache Physician/Extender: G Referring Physician: Augustin Coupe in Treatment: 64 Education Assessment Education Provided To: Patient Education Topics Provided Venous: Handouts: Other: bring juxtalite next visit Methods: Demonstration, Explain/Verbal Responses: State content correctly Electronic Signature(s) Signed: 06/29/2016 4:35:41  PM By: Curtis Sites Entered By: Curtis Sites on 06/29/2016 13:14:35 Hua, [name] ([id number]) -------------------------------------------------------------------------------- Wound Assessment Details Patient Name: Gautier, Amaris L. Date of Service: 06/29/2016 12:45 PM Medical Record Patient Account Number: [id number] Number: Treating RN: Curtis Sites 12-Jan-1956 (60 y.o. Other Clinician: Date of Birth/Sex: Female) Treating ROBSON, MICHAEL Primary Care Physician: Sherrie Mustache Physician/Extender: G Referring Physician: Sherrie Mustache Weeks in Treatment: 27 Wound Status Wound Number: 1 Primary Etiology: Venous Leg Ulcer Wound Location: Right Lower Leg - Medial Wound Status: Open Wounding Event: Blister Date Acquired: 12/15/2015 Weeks Of Treatment: 27 Clustered Wound: No Photos Wound Measurements Length: (cm) 0.1 % Reduction in Are Width: (cm) 0.1 % Reduction in Vol Depth: (cm) 0.1 Epithelialization: Area: (cm) 0.008 Tunneling: Volume: (cm) 0.001 Undermining: a: 100% ume: 100% Large (67-100%) No No Wound Description Classification: Partial Thickness Foul Odor After Cl Wound Margin: Flat and Intact Exudate Amount: Small Exudate Type: Sanguinous Exudate Color: red eansing: No Wound Bed Granulation Amount: Large (67-100%) Exposed Structure Granulation Quality: Red Fascia Exposed: No Necrotic Amount: None Present (0%) Fat Layer Exposed: No Kinkaid, Talma L. ([id number]) Tendon Exposed: No Muscle Exposed: No Joint Exposed: No Bone Exposed: No Limited to Skin Breakdown Periwound Skin Texture Texture Color No Abnormalities Noted: No No Abnormalities Noted: No Callus: No Atrophie Blanche: No Crepitus: No Cyanosis: No Excoriation: No Ecchymosis: No Fluctuance: No Erythema: No Friable: No Hemosiderin Staining: No Induration: No Mottled: No Localized Edema: No Pallor: No Rash: No Rubor: No Scarring: No Temperature /  Pain Moisture Temperature: No Abnormality No Abnormalities Noted: No Dry / Scaly: No Maceration: No Moist: No Wound Preparation Ulcer Cleansing: Other: soap and water, Topical Anesthetic Applied: None Treatment Notes Wound #1 (Right, Medial Lower Leg) 1. Cleansed with: Cleanse wound with antibacterial soap and water 4. Dressing Applied: Promogran 7. Secured with 4 Layer Compression System - Bilateral Electronic Signature(s) Signed: 06/29/2016 4:35:41 PM By: Curtis Sites Entered By: Curtis Sites on 06/29/2016 15:02:39 Wagley, Ivah L. ([id number]) --------------------------------------------------------------------------------  Wound Assessment Details Patient Name: Panik, Monserrate L. Date of Service: 06/29/2016 12:45 PM Medical Record Patient Account Number: [id number] Number: Treating RN: Curtis Sites 1956/04/25 (60 y.o. Other Clinician: Date of Birth/Sex: Female) Treating ROBSON, MICHAEL Primary Care Physician: Sherrie Mustache Physician/Extender: G Referring Physician: Sherrie Mustache Weeks in Treatment: 27 Wound Status Wound Number: 2 Primary Etiology: Lymphedema Wound Location: Left Lower Leg - Posterior Wound Status: Open Wounding Event: Gradually Appeared Date Acquired: 04/19/2016 Weeks Of Treatment: 8 Clustered Wound: Yes Photos Wound Measurements Length: (cm) 0.1 Width: (cm) 0.1 Depth: (cm) 0.1 Area: (cm) 0.008 Volume: (cm) 0.001 % Reduction in Area: 99.1% % Reduction in Volume: 98.8% Epithelialization: Large (67-100%) Tunneling: No Undermining: No Wound Description Classification: Partial Thickness Foul Odor After Wound Margin: Flat and Intact Exudate Amount: Small Exudate Type: Sanguinous Exudate Color: red Cleansing: No Wound Bed Granulation Amount: Large (67-100%) Exposed Structure Granulation Quality: Red Fascia Exposed: No Necrotic Amount: None Present (0%) Fat Layer Exposed: No Lady, Raia L. ([id number]) Tendon  Exposed: No Muscle Exposed: No Joint Exposed: No Bone Exposed: No Limited to Skin Breakdown Periwound Skin Texture Texture Color No Abnormalities Noted: No No Abnormalities Noted: No Callus: No Atrophie Blanche: No Crepitus: No Cyanosis: No Excoriation: No Ecchymosis: No Fluctuance: No Erythema: No Friable: No Hemosiderin Staining: No Induration: No Mottled: No Localized Edema: No Pallor: No Rash: No Rubor: No Scarring: No Temperature / Pain Moisture Tenderness on Palpation: Yes No Abnormalities Noted: No Dry / Scaly: No Maceration: No Moist: No Wound Preparation Ulcer Cleansing: Other: soap and water, Topical Anesthetic Applied: None Treatment Notes Wound #2 (Left, Posterior Lower Leg) 1. Cleansed with: Cleanse wound with antibacterial soap and water 4. Dressing Applied: Promogran 7. Secured with 4 Layer Compression System - Bilateral Electronic Signature(s) Signed: 06/29/2016 4:35:41 PM By: Curtis Sites Entered By: Curtis Sites on 06/29/2016 15:03:01 Slape, [name] ([id number]) -------------------------------------------------------------------------------- Wound Assessment Details Patient Name: Mayeux, Micayla L. Date of Service: 06/29/2016 12:45 PM Medical Record Patient Account Number: [id number] Number: Treating RN: Curtis Sites Sep 29, 1955 (60 y.o. Other Clinician: Date of Birth/Sex: Female) Treating ROBSON, MICHAEL Primary Care Physician: Sherrie Mustache Physician/Extender: G Referring Physician: Sherrie Mustache Weeks in Treatment: 27 Wound Status Wound Number: 6 Primary Etiology: Venous Leg Ulcer Wound Location: Right Lower Leg - Anterior Wound Status: Open Wounding Event: Gradually Appeared Date Acquired: 06/11/2016 Weeks Of Treatment: 2 Clustered Wound: No Photos Wound Measurements Length: (cm) 0.1 Width: (cm) 0.1 Depth: (cm) 0.1 Area: (cm) 0.008 Volume: (cm) 0.001 % Reduction in Area: 99.2% % Reduction in  Volume: 99.1% Epithelialization: Large (67-100%) Tunneling: No Undermining: No Wound Description Classification: Partial Thickness Foul Odor After Wound Margin: Flat and Intact Exudate Amount: Medium Exudate Type: Sanguinous Exudate Color: red Cleansing: No Wound Bed Granulation Amount: Large (67-100%) Exposed Structure Granulation Quality: Red Fascia Exposed: No Necrotic Amount: None Present (0%) Fat Layer Exposed: No Amsler, Reka L. ([id number]) Tendon Exposed: No Muscle Exposed: No Joint Exposed: No Bone Exposed: No Limited to Skin Breakdown Periwound Skin Texture Texture Color No Abnormalities Noted: No No Abnormalities Noted: No Callus: No Atrophie Blanche: No Crepitus: No Cyanosis: No Excoriation: No Ecchymosis: No Fluctuance: No Erythema: No Friable: No Hemosiderin Staining: No Induration: No Mottled: No Localized Edema: No Pallor: No Rash: No Rubor: No Scarring: No Temperature / Pain Moisture Tenderness on Palpation: Yes No Abnormalities Noted: No Dry / Scaly: No Maceration: No Moist: No Wound Preparation Ulcer Cleansing: Other: soap and water, Topical Anesthetic Applied: None Treatment Notes Wound #6 (Right, Anterior  Lower Leg) 1. Cleansed with: Cleanse wound with antibacterial soap and water 4. Dressing Applied: Promogran 7. Secured with 4 Layer Compression System - Bilateral Electronic Signature(s) Signed: 06/29/2016 4:35:41 PM By: Curtis Sites Entered By: Curtis Sites on 06/29/2016 15:03:28 Wentz, [name] ([id number]) -------------------------------------------------------------------------------- Vitals Details Patient Name: Twedt, Zehra L. Date of Service: 06/29/2016 12:45 PM Medical Record Patient Account Number: [id number] Number: Treating RN: Curtis Sites Jun 22, 1956 (60 y.o. Other Clinician: Date of Birth/Sex: Female) Treating ROBSON, MICHAEL Primary Care Physician: Sherrie Mustache Physician/Extender:  G Referring Physician: Sherrie Mustache Weeks in Treatment: 27 Vital Signs Time Taken: 12:54 Temperature (F): 98.9 Height (in): 62 Pulse (bpm): 97 Weight (lbs): 170 Respiratory Rate (breaths/min): 20 Body Mass Index (BMI): 31.1 Blood Pressure (mmHg): 142/65 Reference Range: 80 - 120 mg / dl Electronic Signature(s) Signed: 06/29/2016 4:35:41 PM By: Curtis Sites Entered By: Curtis Sites on 06/29/2016 12:55:06

## 2016-07-06 ENCOUNTER — Ambulatory Visit: Payer: Medicare Other | Admitting: Nurse Practitioner

## 2016-07-13 ENCOUNTER — Encounter: Payer: Medicare Other | Admitting: Internal Medicine

## 2016-07-13 DIAGNOSIS — I87333 Chronic venous hypertension (idiopathic) with ulcer and inflammation of bilateral lower extremity: Secondary | ICD-10-CM | POA: Diagnosis not present

## 2016-07-14 NOTE — Progress Notes (Signed)
KARAN, INCLAN ([id number]) Visit Report for 07/13/2016 Chief Complaint Document Details Strojny, Luara Date of Service: 07/13/2016 12:45 PM Patient Name: [name] Account Number: [id number] Medical Record Treating RN: Curtis Sites [id number] Number: Other Clinician: May 11, 1956 (60 y.o. Treating Sanela Evola Date of Birth/Sex: Female) Physician/Extender: G Primary Care [name], [facility] Physician: Referring Physician: Sherrie Mustache Weeks in Treatment: 29 Information Obtained from: Patient Chief Complaint Patient is here for follow-up regarding her bilateral lower extremity venous stasis ulcers Electronic Signature(s) Signed: 07/13/2016 6:11:07 PM By: [name] Entered By: Baltazar Najjar on 07/13/2016 14:18:49 Duerksen, [name] ([id number]) -------------------------------------------------------------------------------- HPI Details [name] Date of Service: 07/13/2016 12:45 PM Patient Name: [name] Account Number: [id number] Medical Record Treating RN: Curtis Sites [id number] Number: Other Clinician: 1956-01-15 (60 y.o. Treating Nyair Depaulo Date of Birth/Sex: Female) Physician/Extender: G Primary Care [name], [facility] Physician: Referring Physician: Sherrie Mustache Weeks in Treatment: 76 History of Present Illness HPI Description: 12/23/15; this is a 60 year old lady who recently relocated back to [place] from [place]. She is staying with a family friend previously lived with a daughter in [place]. 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 She is on chronic prednisone she is not a diabetic. Dose of prednisone is 10 mg. The patient tells me that roughly a week ago she developed a fairly substantial blister over this area that then ruptured. She was seen in the emergency room on 5/7 an x-ray of the leg was negative she was put on Septra although I don't think any cultures  were done. She also tells me she has had chronic edema in her legs for a number of years. She had a similar presentation to currently in the right lateral lower leg roughly a year ago that she was able to heal on her own. As noted she has systemic lupus but does not have lupus nephritis according to the patient. She has a lot of edema in her lower legs. The ABI's could not be obtained. 12/31/15; the patient's insurance which is [place] makes her out of network for any home health therefore we have been changing her dressing in our facility. I reviewed her trip to the ER on 12/21/2015 her creatinine was within the normal range hemoglobin and white count normal. Culture of the wound was negative. X-ray of the leg showed soft tissue edema no foreign bodies. We have been dressing this with Aquacel Ag due to the amount of ongoing drainage 01/07/16; the patient had her arterial studies this morning I don't have these results area she comes back in to have [place] rewrap since she doesn't have access to home health 01/14/16 I still do not have the results of her arterial studies. Our nurse correctly pointed out that we've been wrapping the left leg without an open wound I think this had to do with the fact that she had so much edema when she came in I was concerned about leaving this unwrapped. 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 We left her left leg unwrapped last week as she has no open wound here today she has extensive edema here. We did order stockings however I believe the company called but they have not called them back. She comes back in today with extensive edema in the left leg. We have her arterial studies  which showed triphasic waves and all locations tested including her posterior tibial and anterior tibial arteries bilaterally. Report listed no hemodynamically  significant bilateral lower extremity arterial stenosis. She has her venous studies later this month. I'm going to rewrap the left leg due to the extent of the edema, transitioning her into the stocking we ordered last week as soon as one is available 01/27/16 the patient arrived today with pooled serosanguineous drainage over the wound it would not absorb into her Aquacel Ag which is somewhat on. In spite of this the dimensions of her wound are down Heymann, Betzaida L. ([id number]) considerably and the wound bed looks healthy without any need for debridement. She is obtained her juxtalite stockings the left leg which has no open wound. We are waiting for the right leg wound to get smaller before ordering her one for the right leg 02/03/16; patient wound looked improved. No debridement was required. We've been using the juxtalite stocking on the left leg which is no open wound. 02/11/16 wound is remarkably better. No debridement was required. Healthy rim of epithelialization. We have been using Aquacel Ag, we'll try to close this down with RTD over the next 2 weeks 02/18/16 wound continues to improve down a centimeter in length and width. No debridement was required. Advancing epithelialization. We started RTD last week 02/25/16: pt brought juxtalite for right lower leg today. nurse reports RTD was dry and adhered to the wound. required removal by NS. wound bed with 100% healthy red granulation tissue. denies systemic s/s of infection. 03/02/16; Considerable improvement. no debridement. 03/09/16; wound bed is very vascular no major change in dimensions. Continuing with RTD 03/16/16; unfortunately no major change in dimensions however the wound bed looks very healthy. I will continue with RTD for one more week. If his stalls consider change to collagen. 03/30/2016 -- she's been using juxta lites on her left lower extremity but her lymphedema is very significantly increased and she is not tolerating the  juxta lites the edema has increased symptoms significantly 04/06/16; apparently the RTD started sticking to the wound therefore she was changed to sorbact. Her 4-layer wrap appears to have slipped one third of the way down her leg. She was tolerating the juxtalite on the left leg, I don't see the need to wrap the left leg. She is not a candidate for external compression pumps secondary to insurance issues. 04/13/16; wound is actually measuring larger. Has been using Sorbact 04/20/16; although the wound dimensions of really not changed all that much, the better this certainly looks healthy and there is advancing epithelialization from both sides. No evidence of infection 04/27/16 changed to [facility] last week and the dimensions appear to be a lot better. No evidence of infection. She does not put the juxtalite stocking on the left leg on correctly nevertheless she has never had an open wound here. I am really uncertain whether we ever got 2 to juxtalite stockings for her. The wound started as a blister 05/04/16; finally with Hydrofera Blue last week the area on the right leg has closed over. However in inspection of her left leg she now has a small wound on the posterior aspect of the left leg. She has been using juxta lites on this area but probably not really applying them correctly and with enough tension. She is not a candidate for compression pumps secondary to insurance issues 05/11/16; the area on her right anterior leg and closed over last week however we noted an area on her  left posterior calf. I elected to wrap both her legs in anticipation we be able to transition her to juxtalite stockings. She arrives today the wraps and fallen down on both sides. The area on the right anterior leg has reopened she also had a very tense blister on the right anterior lateral leg just above her wound which I elected to excise. The area on the posterior left leg is roughly about the same 05/18/16  patient presents today for a follow-up concerning her right and left lower extremity venous stasis ulcers. Currently her wraps which are 4-layer compression have been sliding down due to the amount of swelling that she has. This is causing stasis above the wraps were really does not need to be and subsequently leading to her having blistering especially on the left above where the wrap is. In fact she has a large wound in this area that was just blistered last week and was not nearly as bad as what it is right now. Unfortunately despite what we have tried with the compression at this point I feel that we are almost being counterproductive in this respect. She tells me that her pain as a 9 out of 10 at this point in time. this pain is worse with manipulation of the wound including cleaning as well as just touching. 05/25/16 patient presents today for follow-up unfortunately her bilateral lower extremities actually Wrigley, Lasharon L. ([id number]) significantly more edematous throughout. Last visit her wraps that actually slipped down which had been the course of things of the past several visits. For that reason we actually with light of compression to hopefully make this more uniform. Unfortunately the Kerlex and Coban compression at this point in time appears to have been suboptimal. she continues to have a significant amount of swelling in bilateral lower extremities and unfortunately had several new wounds which have surfaced of the right lower extremity at this point in time. Again venous in nature. She tells me that she does "elevate her legs" as much as she can. She notes that her legs felt tight and she relates this to be may be a 8 out of 10 at this point in time but is not having as much discomfort as last week. she is on Lasix currently 20 mg. 06/01/16; apparently over the last week or 2 the patient is developed a lot more edema in her right greater than left leg. This is resulted in  substantial increase in the wounds in the right leg to a lesser extent the left leg. For reasons that are unclear she is also had reduction in the wraps she is wearing down down to a 2 layer wrap. She has a appointment with her primary physician tomorrow, currently using 40 mg of Lasix a day. The patient has a history of lupus which apparently was systemic. She also has a history of hepatitis C. Somebody has done an ultrasound with a history of a PET hepatitis C elevated LFTs. This is labeled Hepatic Elastography,. Liver is noted to have fatty infiltration no focal abnormality. Her IVC had no abnormality visualized. She did not have ascites. Chronic medical renal disease. 06/11/2016 -- the patient was recently admitted to hospital for extensive lower extremity lymphedema and was worked up and discharged with Unna boots and these were extremely tight for her and she came in for an appointment today. She has been put on new medications as per her discharge summary. 06/15/16 patient presents for evaluation today concerning bilateral lower extremity lymphedema and  venous hypertension which is chronic. At this point in time she seems to be doing somewhat better than her the last evaluation. She tells me that she is not having significant discomfort at this time fortunately. she has no interval signs or symptoms of infection at this point and we have been using Aquacel Ag on her wounds currently. She is in a 4-layer compression at this point 06/22/16 Mrs. Putnam actually is doing welll at this point in time and regarding her lower extremity wounds. She is tolerating the compression wraps at this point. Several the woounds have actually healed and looks significantly better at this point in time. She really only has 2 wounds still open currently. 06/29/16; the patient's wounds have actually healed over. I think this is a combination of chronic venous insufficiency and secondary lymphedema. She is been  wearing loose Profore is [4 layers]. There are no open wounds. She has bilateral juxtalite stockings at home where she lives with a friend. Previously the friend was applying the juxtalite's to the left leg low she was not really applying them snugly enough. 07/13/16; the patient apparently traveled to [place] since the last time she was in this clinic. She drove down with her daughter and came back by train. She ended up hospitalized in [place] apparently with chronic bronchitis and shortness of breath. She came into the clinic today without anything on her legs. She has open areas on the right medial lower leg, 3 small clean open wounds although she did not have these last time she was here Electronic Signature(s) Signed: 07/13/2016 6:11:07 PM By: [name] Entered By: Baltazar Najjar on 07/13/2016 14:20:51 Lukas, [name] ([id number]) -------------------------------------------------------------------------------- Physical Exam Details [name] Date of Service: 07/13/2016 12:45 PM Patient Name: [name] Account Number: [id number] Medical Record Treating RN: Curtis Sites [id number] Number: Other Clinician: 12/03/1955 (60 y.o. Treating Erik Nessel Date of Birth/Sex: Female) Physician/Extender: G Primary Care [name], [facility] Physician: Referring Physician: Sherrie Mustache Weeks in Treatment: 62 Constitutional Patient is hypertensive.. Pulse regular and within target range for patient.Marland Kitchen Respirations regular, non-labored and within target range.. Temperature is normal and within the target range for the patient.. Patient's appearance is neat and clean. Appears in no acute distress. Well nourished and well developed.Marland Kitchen Respiratory She appeared somewhat short of breath with upper airway sounds. Air entry was clear no wheezing upper airway sounds noted. Cardiovascular Heart rhythm and rate regular, without murmur or gallop. No evidence of CHF. Pedal pulses  palpable and strong bilaterally.. Edema present in both extremities. I believe this to be a combination of lymphedema with chronic venous insufficiency. Gastrointestinal (GI) Abdomen is soft and non-distended without masses or tenderness. Bowel sounds active in all quadrants.. No liver or spleen enlargement or tenderness.Marland Kitchen Psychiatric No evidence of depression, anxiety, or agitation. Calm, cooperative, and communicative. Appropriate interactions and affect.. Notes Wound exam; the patient has a small set of 3 wounds on the medial right leg. All of these appear to be healthy. No debridement was required. She has bilateral venous insufficiency with lymphedema. Her edema is not well controlled on either leg. I don't think she had any compression on Electronic Signature(s) Signed: 07/13/2016 6:11:07 PM By: [name] Entered By: Baltazar Najjar on 07/13/2016 14:22:43 Wolford, [name] ([id number]) -------------------------------------------------------------------------------- Physician Orders Details [name] Date of Service: 07/13/2016 12:45 PM Patient Name: [name] Account Number: [id number] Medical Record Treating RN: Curtis Sites [id number] Number: Other Clinician: Apr 30, 1956 (60 y.o. Treating Tuesday Terlecki Date of Birth/Sex: Female) Physician/Extender:  G Primary [name] Physician: Referring Physician: Sherrie MustacheJADALI, FAYEGH Weeks in Treatment: 6329 Verbal / Phone Orders: Yes Clinician: Curtis Sitesorthy, Joanna Read Back and Verified: Yes Diagnosis Coding ICD-10 Coding Code Description 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 lower I87.331 extremity I87.322 Chronic venous hypertension (idiopathic) with inflammation of left lower extremity L97.211 Non-pressure chronic ulcer of right calf limited to breakdown of skin I89.0 Lymphedema, not elsewhere classified M32.10 Systemic lupus erythematosus, organ or system involvement  unspecified Wound Cleansing Wound #1 Right,Medial Lower Leg o Cleanse wound with mild soap and water Skin Barriers/Peri-Wound Care Wound #1 Right,Medial Lower Leg o Moisturizing lotion - apply liberally to both lower legs before applying wraps Primary Wound Dressing Wound #1 Right,Medial Lower Leg o Aquacel Ag Secondary Dressing Wound #1 Right,Medial Lower Leg o Dry Gauze Dressing Change Frequency Wound #1 Right,Medial Lower Leg o Change dressing every week Follow-up Appointments Wound #1 Right,Medial Lower Leg Urschel, Kalina L. ([id number]) o Return Appointment in 1 week. Edema Control Wound #1 Right,Medial Lower Leg o 4 Layer Compression System - Bilateral - wrapped lightly Additional Orders / Instructions Wound #1 Right,Medial Lower Leg o Increase protein intake. Home Health Wound #1 Right,Medial Lower Leg o [facility] please instruct patient's caregiver in juxtalite application o Home Health Nurse may visit PRN to address patientos wound care needs. o FACE TO FACE ENCOUNTER: MEDICARE and MEDICAID PATIENTS: I certify that this patient is under my care and that I had a face-to-face encounter that meets the physician face-to-face encounter requirements with this patient on this date. The encounter with the patient was in whole or in part for the following MEDICAL CONDITION: (primary reason for Home Healthcare) MEDICAL NECESSITY: I certify, that based on my findings, NURSING services are a medically necessary home health service. HOME BOUND STATUS: I certify that my clinical findings support that this patient is homebound (i.e., Due to illness or injury, pt requires aid of supportive devices such as crutches, cane, wheelchairs, walkers, the use of special transportation or the assistance of another person to leave their place of residence. There is a normal inability to leave the home and doing so requires considerable and taxing  effort. Other absences are for medical reasons / religious services and are infrequent or of short duration when for other reasons). o If current dressing causes regression in wound condition, may D/C ordered dressing product/s and apply Normal Saline Moist Dressing daily until next Wound Healing Center / Other MD appointment. Notify Wound Healing Center of regression in wound condition at [phone]. o Please direct any NON-WOUND related issues/requests for orders to patient's Primary Care Physician Electronic Signature(s) Signed: 07/13/2016 6:05:10 PM By: Curtis Sitesorthy, Joanna Signed: 07/13/2016 6:11:07 PM By: Baltazar Najjarobson, Adin Laker MD Entered By: Curtis Sitesorthy, Joanna on 07/13/2016 14:22:18 Haran, [name] ([id number]) -------------------------------------------------------------------------------- Problem List Details Blase MessRAWFORD, Roby Date of Service: 07/13/2016 12:45 PM Patient Name: [name] Account Number: [id number] Medical Record Treating RN: Curtis SitesDorthy, Joanna [id number] Number: Other Clinician: 12/03/1955 (60 y.o. Treating Tane Biegler Date of Birth/Sex: Female) Physician/Extender: G Primary Care [name] Physician: Referring Physician: Sherrie MustacheJADALI, FAYEGH Weeks in Treatment: 29 Active Problems ICD-10 Encounter Code Description Active Date Diagnosis I87.331 Chronic venous hypertension (idiopathic) with ulcer and 12/23/2015 Yes inflammation of right lower extremity I87.322 Chronic venous hypertension (idiopathic) with 12/23/2015 Yes inflammation of left lower extremity L97.211 Non-pressure chronic ulcer of right calf limited to 03/02/2016 Yes breakdown of skin I89.0 Lymphedema, not elsewhere classified 06/01/2016 Yes M32.10 Systemic lupus erythematosus, organ  or system 12/23/2015 Yes involvement unspecified Inactive Problems Resolved Problems Electronic Signature(s) Signed: 07/13/2016 6:11:07 PM By: [name] Entered By: Baltazar Najjar on 07/13/2016 14:18:28 Burdi,  [name] ([id number]) -------------------------------------------------------------------------------- Progress Note Details Cournoyer, Maryssa Date of Service: 07/13/2016 12:45 PM Patient Name: [name] Account Number: [id number] Medical Record Treating RN: Curtis Sites [id number] Number: Other Clinician: 03-17-1956 (60 y.o. Treating Qasim Diveley Date of Birth/Sex: Female) Physician/Extender: G Primary Care [name], [facility] Physician: Referring Physician: Sherrie Mustache Weeks in Treatment: 29 Subjective Chief Complaint Information obtained from Patient Patient is here for follow-up regarding her bilateral lower extremity venous stasis ulcers History of Present Illness (HPI) 12/23/15; this is a 60 year old lady who recently relocated back to [place] from [place]. She is staying with a family friend previously lived with a daughter in [place]. 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 She is on chronic prednisone she is not a diabetic. Dose of prednisone is 10 mg. The patient tells me that roughly a week ago she developed a fairly substantial blister over this area that then ruptured. She was seen in the emergency room on 5/7 an x-ray of the leg was negative she was put on Septra although I don't think any cultures were done. She also tells me she has had chronic edema in her legs for a number of years. She had a similar presentation to currently in the right lateral lower leg roughly a year ago that she was able to heal on her own. As noted she has systemic lupus but does not have lupus nephritis according to the patient. She has a lot of edema in her lower legs. The ABI's could not be obtained. 12/31/15; the patient's insurance which is [place] makes her out of network for any home health therefore we have been changing her dressing in our facility. I reviewed her trip to the ER on 12/21/2015 her creatinine was  within the normal range hemoglobin and white count normal. Culture of the wound was negative. X-ray of the leg showed soft tissue edema no foreign bodies. We have been dressing this with Aquacel Ag due to the amount of ongoing drainage 01/07/16; the patient had her arterial studies this morning I don't have these results area she comes back in to have [place] rewrap since she doesn't have access to home health 01/14/16 I still do not have the results of her arterial studies. Our nurse correctly pointed out that we've been wrapping the left leg without an open wound I think this had to do with the fact that she had so much edema when she came in I was concerned about leaving this unwrapped. 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 We left her left leg unwrapped last week as she has no open wound here today she has extensive edema here. We did order stockings however I believe the company called but they have not called them back. She comes back in today with extensive edema in the left leg. We have her arterial studies which Aponte, Veona L. ([id number]) showed triphasic waves and all locations tested including her posterior tibial and anterior tibial arteries bilaterally. Report listed no hemodynamically significant bilateral lower extremity arterial stenosis. She has her venous studies later this month. I'm going to rewrap the left leg due to the extent  of the edema, transitioning her into the stocking we ordered last week as soon as one is available 01/27/16 the patient arrived today with pooled serosanguineous drainage over the wound it would not absorb into her Aquacel Ag which is somewhat on. In spite of this the dimensions of her wound are down considerably and the wound bed looks healthy without any need for debridement. She is obtained her juxtalite stockings the  left leg which has no open wound. We are waiting for the right leg wound to get smaller before ordering her one for the right leg 02/03/16; patient wound looked improved. No debridement was required. We've been using the juxtalite stocking on the left leg which is no open wound. 02/11/16 wound is remarkably better. No debridement was required. Healthy rim of epithelialization. We have been using Aquacel Ag, we'll try to close this down with RTD over the next 2 weeks 02/18/16 wound continues to improve down a centimeter in length and width. No debridement was required. Advancing epithelialization. We started RTD last week 02/25/16: pt brought juxtalite for right lower leg today. nurse reports RTD was dry and adhered to the wound. required removal by NS. wound bed with 100% healthy red granulation tissue. denies systemic s/s of infection. 03/02/16; Considerable improvement. no debridement. 03/09/16; wound bed is very vascular no major change in dimensions. Continuing with RTD 03/16/16; unfortunately no major change in dimensions however the wound bed looks very healthy. I will continue with RTD for one more week. If his stalls consider change to collagen. 03/30/2016 -- she's been using juxta lites on her left lower extremity but her lymphedema is very significantly increased and she is not tolerating the juxta lites the edema has increased symptoms significantly 04/06/16; apparently the RTD started sticking to the wound therefore she was changed to sorbact. Her 4-layer wrap appears to have slipped one third of the way down her leg. She was tolerating the juxtalite on the left leg, I don't see the need to wrap the left leg. She is not a candidate for external compression pumps secondary to insurance issues. 04/13/16; wound is actually measuring larger. Has been using Sorbact 04/20/16; although the wound dimensions of really not changed all that much, the better this certainly looks healthy and there is  advancing epithelialization from both sides. No evidence of infection 04/27/16 changed to [facility] last week and the dimensions appear to be a lot better. No evidence of infection. She does not put the juxtalite stocking on the left leg on correctly nevertheless she has never had an open wound here. I am really uncertain whether we ever got 2 to juxtalite stockings for her. The wound started as a blister 05/04/16; finally with Hydrofera Blue last week the area on the right leg has closed over. However in inspection of her left leg she now has a small wound on the posterior aspect of the left leg. She has been using juxta lites on this area but probably not really applying them correctly and with enough tension. She is not a candidate for compression pumps secondary to insurance issues 05/11/16; the area on her right anterior leg and closed over last week however we noted an area on her left posterior calf. I elected to wrap both her legs in anticipation we be able to transition her to juxtalite stockings. She arrives today the wraps and fallen down on both sides. The area on the right anterior leg has reopened she also had a very tense blister on the  right anterior lateral leg just above her wound which I elected to excise. The area on the posterior left leg is roughly about the same 05/18/16 patient presents today for a follow-up concerning her right and left lower extremity venous stasis ulcers. Currently her wraps which are 4-layer compression have been sliding down due to the amount of swelling that she has. This is causing stasis above the wraps were really does not need to be and subsequently leading to her having blistering especially on the left above where the wrap is. In fact she has Babinski, Dellene L. ([id number]) a large wound in this area that was just blistered last week and was not nearly as bad as what it is right now. Unfortunately despite what we have tried with the  compression at this point I feel that we are almost being counterproductive in this respect. She tells me that her pain as a 9 out of 10 at this point in time. this pain is worse with manipulation of the wound including cleaning as well as just touching. 05/25/16 patient presents today for follow-up unfortunately her bilateral lower extremities actually significantly more edematous throughout. Last visit her wraps that actually slipped down which had been the course of things of the past several visits. For that reason we actually with light of compression to hopefully make this more uniform. Unfortunately the Kerlex and Coban compression at this point in time appears to have been suboptimal. she continues to have a significant amount of swelling in bilateral lower extremities and unfortunately had several new wounds which have surfaced of the right lower extremity at this point in time. Again venous in nature. She tells me that she does "elevate her legs" as much as she can. She notes that her legs felt tight and she relates this to be may be a 8 out of 10 at this point in time but is not having as much discomfort as last week. she is on Lasix currently 20 mg. 06/01/16; apparently over the last week or 2 the patient is developed a lot more edema in her right greater than left leg. This is resulted in substantial increase in the wounds in the right leg to a lesser extent the left leg. For reasons that are unclear she is also had reduction in the wraps she is wearing down down to a 2 layer wrap. She has a appointment with her primary physician tomorrow, currently using 40 mg of Lasix a day. The patient has a history of lupus which apparently was systemic. She also has a history of hepatitis C. Somebody has done an ultrasound with a history of a PET hepatitis C elevated LFTs. This is labeled Hepatic Elastography,. Liver is noted to have fatty infiltration no focal abnormality. Her IVC had  no abnormality visualized. She did not have ascites. Chronic medical renal disease. 06/11/2016 -- the patient was recently admitted to hospital for extensive lower extremity lymphedema and was worked up and discharged with Unna boots and these were extremely tight for her and she came in for an appointment today. She has been put on new medications as per her discharge summary. 06/15/16 patient presents for evaluation today concerning bilateral lower extremity lymphedema and venous hypertension which is chronic. At this point in time she seems to be doing somewhat better than her the last evaluation. She tells me that she is not having significant discomfort at this time fortunately. she has no interval signs or symptoms of infection at this point and we  have been using Aquacel Ag on her wounds currently. She is in a 4-layer compression at this point 06/22/16 Mrs. Wrzesinski actually is doing welll at this point in time and regarding her lower extremity wounds. She is tolerating the compression wraps at this point. Several the woounds have actually healed and looks significantly better at this point in time. She really only has 2 wounds still open currently. 06/29/16; the patient's wounds have actually healed over. I think this is a combination of chronic venous insufficiency and secondary lymphedema. She is been wearing loose Profore is [4 layers]. There are no open wounds. She has bilateral juxtalite stockings at home where she lives with a friend. Previously the friend was applying the juxtalite's to the left leg low she was not really applying them snugly enough. 07/13/16; the patient apparently traveled to [place] since the last time she was in this clinic. She drove down with her daughter and came back by train. She ended up hospitalized in [place] apparently with chronic bronchitis and shortness of breath. She came into the clinic today without anything on her legs. She has open areas on the  right medial lower leg, 3 small clean open wounds although she did not have these last time she was here CANTINA, VERBECK. ([id number]) Objective Constitutional Patient is hypertensive.. Pulse regular and within target range for patient.Marland Kitchen Respirations regular, non-labored and within target range.. Temperature is normal and within the target range for the patient.. Patient's appearance is neat and clean. Appears in no acute distress. Well nourished and well developed.. Vitals Time Taken: 1:17 PM, Height: 62 in, Weight: 170 lbs, BMI: 31.1, Temperature: 98.1 F, Pulse: 107 bpm, Respiratory Rate: 22 breaths/min, Blood Pressure: 149/51 mmHg. Respiratory She appeared somewhat short of breath with upper airway sounds. Air entry was clear no wheezing upper airway sounds noted. Cardiovascular Heart rhythm and rate regular, without murmur or gallop. No evidence of CHF. Pedal pulses palpable and strong bilaterally.. Edema present in both extremities. I believe this to be a combination of lymphedema with chronic venous insufficiency. Gastrointestinal (GI) Abdomen is soft and non-distended without masses or tenderness. Bowel sounds active in all quadrants.. No liver or spleen enlargement or tenderness.Marland Kitchen Psychiatric No evidence of depression, anxiety, or agitation. Calm, cooperative, and communicative. Appropriate interactions and affect.. General Notes: Wound exam; the patient has a small set of 3 wounds on the medial right leg. All of these appear to be healthy. No debridement was required. She has bilateral venous insufficiency with lymphedema. Her edema is not well controlled on either leg. I don't think she had any compression on Integumentary (Hair, Skin) Wound #1 status is Open. Original cause of wound was Blister. The wound is located on the Right,Medial Lower Leg. The wound measures 2.1cm length x 3.3cm width x 0.1cm depth; 5.443cm^2 area and 0.544cm^3 volume. The wound is limited to skin  breakdown. There is no tunneling or undermining noted. There is a small amount of sanguinous drainage noted. The wound margin is flat and intact. There is large (67-100%) red granulation within the wound bed. There is no necrotic tissue within the wound bed. The periwound skin appearance did not exhibit: Callus, Crepitus, Excoriation, Fluctuance, Friable, Induration, Localized Edema, Rash, Scarring, Dry/Scaly, Maceration, Moist, Atrophie Blanche, Cyanosis, Ecchymosis, Hemosiderin Staining, Mottled, Pallor, Rubor, Erythema. Periwound temperature was noted as No Abnormality. Wound #2 status is Healed - Epithelialized. Original cause of wound was Gradually Appeared. The wound is located on the Left,Posterior Lower Leg. The wound measures 0cm length  x 0cm width x 0cm depth; 0cm^2 area and 0cm^3 volume. The wound is limited to skin breakdown. There is no tunneling or undermining noted. There is a none present amount of drainage noted. The wound margin is flat and intact. There is no granulation within the wound bed. There is no necrotic tissue within the wound bed. The DARLYN, REPSHER ([id number]) periwound skin appearance did not exhibit: Callus, Crepitus, Excoriation, Fluctuance, Friable, Induration, Localized Edema, Rash, Scarring, Dry/Scaly, Maceration, Moist, Atrophie Blanche, Cyanosis, Ecchymosis, Hemosiderin Staining, Mottled, Pallor, Rubor, Erythema. The periwound has tenderness on palpation. Wound #6 status is Healed - Epithelialized. Original cause of wound was Gradually Appeared. The wound is located on the Right,Anterior Lower Leg. The wound measures 0cm length x 0cm width x 0cm depth; 0cm^2 area and 0cm^3 volume. The wound is limited to skin breakdown. There is no tunneling or undermining noted. There is a none present amount of drainage noted. The wound margin is flat and intact. There is no granulation within the wound bed. There is no necrotic tissue within the wound bed.  The periwound skin appearance did not exhibit: Callus, Crepitus, Excoriation, Fluctuance, Friable, Induration, Localized Edema, Rash, Scarring, Dry/Scaly, Maceration, Moist, Atrophie Blanche, Cyanosis, Ecchymosis, Hemosiderin Staining, Mottled, Pallor, Rubor, Erythema. The periwound has tenderness on palpation. Assessment Active Problems ICD-10 I87.331 - 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 lower extremity I87.322 - Chronic venous hypertension (idiopathic) with inflammation of left lower extremity L97.211 - Non-pressure chronic ulcer of right calf limited to breakdown of skin I89.0 - Lymphedema, not elsewhere classified M32.10 - Systemic lupus erythematosus, organ or system involvement unspecified Plan Wound Cleansing: Wound #1 Right,Medial Lower Leg: Cleanse wound with mild soap and water Skin Barriers/Peri-Wound Care: Wound #1 Right,Medial Lower Leg: Moisturizing lotion - apply liberally to both lower legs before applying wraps Primary Wound Dressing: Wound #1 Right,Medial Lower Leg: Aquacel Ag Secondary Dressing: Wound #1 Right,Medial Lower Leg: Dry Gauze Dressing Change Frequency: Wound #1 Right,Medial Lower Leg: Change dressing every week Follow-up Appointments: STEPHANNIE, BRONER ([id number]) Wound #1 Right,Medial Lower Leg: Return Appointment in 1 week. Edema Control: Wound #1 Right,Medial Lower Leg: 4 Layer Compression System - Bilateral - wrapped lightly Additional Orders / Instructions: Wound #1 Right,Medial Lower Leg: Increase protein intake. Home Health: Wound #1 Right,Medial Lower Leg: Continue Home Health Visits - [facility] please instruct patient's caregiver in juxtalite application Home Health Nurse may visit PRN to address patient s wound care needs. FACE TO FACE ENCOUNTER: MEDICARE and MEDICAID PATIENTS: I certify that this patient is under my care and that I had a face-to-face encounter that meets the physician  face-to-face encounter requirements with this patient on this date. The encounter with the patient was in whole or in part for the following MEDICAL CONDITION: (primary reason for Home Healthcare) MEDICAL NECESSITY: I certify, that based on my findings, NURSING services are a medically necessary home health service. HOME BOUND STATUS: I certify that my clinical findings support that this patient is homebound (i.e., Due to illness or injury, pt requires aid of supportive devices such as crutches, cane, wheelchairs, walkers, the use of special transportation or the assistance of another person to leave their place of residence. There is a normal inability to leave the home and doing so requires considerable and taxing effort. Other absences are for medical reasons / religious services and are infrequent or of short duration when for other reasons). If current dressing causes regression in wound condition, may D/C ordered dressing product/s  and apply Normal Saline Moist Dressing daily until next Wound Healing Center / Other MD appointment. Notify Wound Healing Center of regression in wound condition at [phone]. Please direct any NON-WOUND related issues/requests for orders to patient's Primary Care Physician silver alginate to the wounds on the medial right leg profroe Juxtalite stocking to the left leg. I am not sure of the compliance here recheck next week. may be able to be dischzrged at that point Electronic Signature(s) Signed: 07/13/2016 5:49:56 PM By: Elliot Gurney RN, [name], [name], [name] Signed: 07/13/2016 6:11:07 PM By: [name] Entered By: Elliot Gurney RN, [name], Kim on 07/13/2016 17:49:56 Filkins, [name] ([id number]) -------------------------------------------------------------------------------- SuperBill Details Vandermeer, Yetzali Date of Service: 07/13/2016 Patient Name: [name] Account Number: [id number] Medical Record Treating RN: Curtis Sites [id number] Number: Other  Clinician: 1955-09-22 (60 y.o. Treating Rayanna Matusik Date of Birth/Sex: Female) Physician/Extender: G Primary Care Weeks in Treatment: 49 [name], FAYEGH Physician: Referring Physician: Sherrie Mustache Diagnosis Coding ICD-10 Codes Code Description 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 lower I87.331 extremity I87.322 Chronic venous hypertension (idiopathic) with inflammation of left lower extremity L97.211 Non-pressure chronic ulcer of right calf limited to breakdown of skin I89.0 Lymphedema, not elsewhere classified M32.10 Systemic lupus erythematosus, organ or system involvement unspecified Facility Procedures CPT4: Description Modifier Quantity Code [id number] (Facility Use Only) [phone] - APPLY MULTLAY COMPRS LWR RT 1 LEG Physician Procedures CPT4: Description Modifier Quantity Code [id number] - WC PHYS LEVEL 3 - EST PT 1 ICD-10 Description Diagnosis I87.331 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 lower extremity L97.211 Non-pressure chronic ulcer of  right calf limited to breakdown of skin I89.0 Lymphedema, not elsewhere classified Electronic Signature(s) Signed: 07/13/2016 4:08:00 PM By: Curtis Sites Signed: 07/13/2016 6:11:07 PM By: [name] Entered By: Curtis Sites on 07/13/2016 16:08:00

## 2016-07-14 NOTE — Progress Notes (Addendum)
CREE, RAMCHANDANI ([id number]) Visit Report for 07/13/2016 Arrival Information Details Patient Name: [name], [name] Date of Service: 07/13/2016 12:45 PM Medical Record Patient Account Number: [id number] Number: Treating RN: Curtis Sites 07-Dec-1955 (60 y.o. Other Clinician: Date of Birth/Sex: Female) Treating ROBSON, MICHAEL Primary Care Physician: Sherrie Mustache Physician/Extender: G Referring Physician: Sherrie Mustache Weeks in Treatment: 29 Visit Information History Since Last Visit Added or deleted any medications: No Patient Arrived: Walker Any new allergies or adverse reactions: No Arrival Time: 13:17 Had a fall or experienced change in No Accompanied By: friend activities of daily living that may affect Transfer Assistance: None risk of falls: Patient Identification Verified: Yes Signs or symptoms of abuse/neglect since last No Secondary Verification Process Completed: Yes visito Patient Requires Transmission-Based No Hospitalized since last visit: No Precautions: Pain Present Now: No Patient Has Alerts: No Electronic Signature(s) Signed: 07/13/2016 3:59:23 PM By: Elliot Gurney, RN, [name], [name], [name] Entered By: Elliot Gurney, RN, [name], Kim on 07/13/2016 15:59:22 Toothaker, [name] ([id number]) -------------------------------------------------------------------------------- Encounter Discharge Information Details Patient Name: [name], [name] Date of Service: 07/13/2016 12:45 PM Medical Record Patient Account Number: [id number] Number: Treating RN: Curtis Sites 20-Nov-1955 (60 y.o. Other Clinician: Date of Birth/Sex: Female) Treating ROBSON, MICHAEL Primary Care Physician: Sherrie Mustache Physician/Extender: G Referring Physician: Sherrie Mustache Weeks in Treatment: 32 Encounter Discharge Information Items Discharge Pain Level: 0 Discharge Condition: Stable Ambulatory Status: Walker Discharge Destination: Home Transportation: Private  Auto Accompanied By: friend Schedule Follow-up Appointment: Yes Medication Reconciliation completed and provided to Patient/Care No Mohd Clemons: Provided on Clinical Summary of Care: 07/13/2016 Form Type Recipient Paper Patient GC Electronic Signature(s) Signed: 07/13/2016 4:36:47 PM By: Curtis Sites Previous Signature: 07/13/2016 2:23:40 PM Version By: Gwenlyn Perking Entered By: Curtis Sites on 07/13/2016 16:36:47 Bangerter, [name] ([id number]) -------------------------------------------------------------------------------- Lower Extremity Assessment Details Patient Name: [name], [name] Date of Service: 07/13/2016 12:45 PM Medical Record Patient Account Number: [id number] Number: Treating RN: Curtis Sites [birth year] (60 y.o. Other Clinician: Date of Birth/Sex: Female) Treating ROBSON, MICHAEL Primary Care Physician: Sherrie Mustache Physician/Extender: G Referring Physician: Sherrie Mustache Weeks in Treatment: 29 Edema Assessment Assessed: [Left: No] [Right: No] Edema: [Left: Yes] [Right: Yes] Calf Left: Right: Point of Measurement: 31 cm From Medial Instep cm 48.2 cm Ankle Left: Right: Point of Measurement: 11 cm From Medial Instep cm 28.3 cm Vascular Assessment Pulses: Posterior Tibial Dorsalis Pedis Palpable: [Left:Yes] [Right:Yes] Extremity colors, hair growth, and conditions: Extremity Color: [Left:Hyperpigmented] [Right:Hyperpigmented] Hair Growth on Extremity: [Left:No] [Right:No] Temperature of Extremity: [Left:Cool] [Right:Cool] Capillary Refill: [Left:< 3 seconds] [Right:< 3 seconds] Electronic Signature(s) Signed: 07/13/2016 3:59:43 PM By: Elliot Gurney RN, [name], [name], [name] Signed: 07/13/2016 6:05:10 PM By: Curtis Sites Entered By: Elliot Gurney RN, [name], Kim on 07/13/2016 15:59:43 Creelman, [name] ([id number]) -------------------------------------------------------------------------------- Multi Wound Chart Details Patient Name: [name], [name] Date of Service: 07/13/2016 12:45 PM Medical Record Patient Account Number: [id number] Number: Treating RN: Curtis Sites June 19, 1956 (60 y.o. Other Clinician: Date of Birth/Sex: Female) Treating ROBSON, MICHAEL Primary Care Physician: Sherrie Mustache Physician/Extender: G Referring Physician: Sherrie Mustache Weeks in Treatment: 29 Vital Signs Height(in): 62 Pulse(bpm): 107 Weight(lbs): 170 Blood Pressure 149/51 (mmHg): Body Mass Index(BMI): 31 Temperature(F): 98.1 Respiratory Rate 22 (breaths/min): Photos: Wound Location: Right Lower Leg - Medial Left Lower Leg - Posterior Right Lower Leg - Anterior Wounding Event: Blister Gradually Appeared Gradually Appeared Primary Etiology: Venous Leg Ulcer Lymphedema Venous Leg Ulcer Date Acquired: 12/15/2015 04/19/2016 06/11/2016 Weeks of Treatment: 29 10 4  Wound Status: Open Healed -  Epithelialized Healed - Epithelialized Clustered Wound: No Yes No Measurements L x W x D 2.1x3.3x0.1 0x0x0 0x0x0 (cm) Area (cm) : 5.443 0 0 Volume (cm) : 0.544 0 0 % Reduction in Area: 79.00% 100.00% 100.00% % Reduction in Volume: 79.00% 100.00% 100.00% Classification: Partial Thickness Partial Thickness Partial Thickness Exudate Amount: Small None Present None Present Exudate Type: Sanguinous N/A N/A Exudate Color: red N/A N/A Wound Margin: Flat and Intact Flat and Intact Flat and Intact Granulation Amount: Large (67-100%) None Present (0%) None Present (0%) Granulation Quality: Red N/A N/A Necrotic Amount: None Present (0%) None Present (0%) None Present (0%) [name], [name] ([id number]) Exposed Structures: Fascia: No Fascia: No Fascia: No Fat: No Fat: No Fat: No Tendon: No Tendon: No Tendon: No Muscle: No Muscle: No Muscle: No Joint: No Joint: No Joint: No Bone: No Bone: No Bone: No Limited to Skin Limited to Skin Limited to Skin Breakdown Breakdown Breakdown Epithelialization: Large (67-100%) Large (67-100%)  None Periwound Skin Texture: Edema: No Edema: No Edema: No Excoriation: No Excoriation: No Excoriation: No Induration: No Induration: No Induration: No Callus: No Callus: No Callus: No Crepitus: No Crepitus: No Crepitus: No Fluctuance: No Fluctuance: No Fluctuance: No Friable: No Friable: No Friable: No Rash: No Rash: No Rash: No Scarring: No Scarring: No Scarring: No Periwound Skin Maceration: No Maceration: No Maceration: No Moisture: Moist: No Moist: No Moist: No Dry/Scaly: No Dry/Scaly: No Dry/Scaly: No Periwound Skin Color: Atrophie Blanche: No Atrophie Blanche: No Atrophie Blanche: No Cyanosis: No Cyanosis: No Cyanosis: No Ecchymosis: No Ecchymosis: No Ecchymosis: No Erythema: No Erythema: No Erythema: No Hemosiderin Staining: No Hemosiderin Staining: No Hemosiderin Staining: No Mottled: No Mottled: No Mottled: No Pallor: No Pallor: No Pallor: No Rubor: No Rubor: No Rubor: No Temperature: No Abnormality N/A N/A Tenderness on No Yes Yes Palpation: Wound Preparation: Ulcer Cleansing: Other: Ulcer Cleansing: Other: Ulcer Cleansing: Other: soap and water soap and water soap and water Topical Anesthetic Topical Anesthetic Topical Anesthetic Applied: None Applied: None Applied: None Treatment Notes Electronic Signature(s) Signed: 07/13/2016 6:05:10 PM By: Curtis Sites Entered By: Curtis Sites on 07/13/2016 14:09:31 Ager, [name] ([id number]) -------------------------------------------------------------------------------- Multi-Disciplinary Care Plan Details Patient Name: Umeda, Jannetta L. Date of Service: 07/13/2016 12:45 PM Medical Record Patient Account Number: [id number] Number: Treating RN: Curtis Sites 10/17/1955 (60 y.o. Other Clinician: Date of Birth/Sex: Female) Treating ROBSON, MICHAEL Primary Care Physician: Sherrie Mustache Physician/Extender: G Referring Physician: Sherrie Mustache Weeks in Treatment:  39 Active Inactive Electronic Signature(s) Signed: 08/31/2016 11:59:35 AM By: Elliot Gurney RN, [name], [name], [name] Signed: 11/25/2016 4:58:28 PM By: Curtis Sites Previous Signature: 07/13/2016 6:05:10 PM Version By: Curtis Sites Entered By: Elliot Gurney RN, [name], Kim on 08/26/2016 15:22:05 Vanderweele, [name] ([id number]) -------------------------------------------------------------------------------- Pain Assessment Details Patient Name: Swinger, Creta L. Date of Service: 07/13/2016 12:45 PM Medical Record Patient Account Number: [id number] Number: Treating RN: Curtis Sites 1956/05/19 (60 y.o. Other Clinician: Date of Birth/Sex: Female) Treating ROBSON, MICHAEL Primary Care Physician: Sherrie Mustache Physician/Extender: G Referring Physician: Sherrie Mustache Weeks in Treatment: 29 Active Problems Location of Pain Severity and Description of Pain Patient Has Paino No Site Locations Pain Management and Medication Current Pain Management: Notes Topical or injectable lidocaine is offered to patient for acute pain when surgical debridement is performed. If needed, Patient is instructed to use over the counter pain medication for the following 24-48 hours after debridement. Wound care MDs do not prescribed pain medications. Patient has chronic pain or uncontrolled pain. Patient has been instructed to  make an appointment with their Primary Care Physician for pain management. Electronic Signature(s) Signed: 07/13/2016 3:59:29 PM By: Elliot Gurney RN, [name], [name], [name] Signed: 07/13/2016 6:05:10 PM By: Curtis Sites Entered By: Elliot Gurney RN, [name], Kim on 07/13/2016 15:59:29 Levenhagen, [name] ([id number]) -------------------------------------------------------------------------------- Patient/Caregiver Education Details Patient Name: Jacquez, Saher L. Date of Service: 07/13/2016 12:45 PM Medical Record Patient Account Number: [id number] Number: Treating RN: Curtis Sites 1956-07-21  (60 y.o. Other Clinician: Date of Birth/Gender: Female) Treating ROBSON, MICHAEL Primary Care Physician: Sherrie Mustache Physician/Extender: G Referring Physician: Augustin Coupe in Treatment: 38 Education Assessment Education Provided To: Patient and Caregiver Education Topics Provided Venous: Handouts: Other: wear juxtalite on left leg Methods: Explain/Verbal Responses: State content correctly Electronic Signature(s) Signed: 07/13/2016 6:05:10 PM By: Curtis Sites Entered By: Curtis Sites on 07/13/2016 16:37:11 Macpherson, [name] ([id number]) -------------------------------------------------------------------------------- Wound Assessment Details Patient Name: Peth, Laketha L. Date of Service: 07/13/2016 12:45 PM Medical Record Number: [id number] Patient Account Number: [id number] Date of Birth/Sex: [birth year] (60 y.o. Female) Treating RN: Curtis Sites Primary Care Physician: Sherrie Mustache Other Clinician: Referring Physician: Sherrie Mustache Treating Physician/Extender: Kathreen Cosier in Treatment: 29 Wound Status Wound Number: 1 Primary Etiology: Venous Leg Ulcer Wound Location: Right Lower Leg - Medial Wound Status: Open Wounding Event: Blister Date Acquired: 12/15/2015 Weeks Of Treatment: 29 Clustered Wound: No Photos Wound Measurements Length: (cm) 2.1 Width: (cm) 3.3 Depth: (cm) 0.1 Area: (cm) 5.443 Volume: (cm) 0.544 % Reduction in Area: 79% % Reduction in Volume: 79% Epithelialization: Large (67-100%) Tunneling: No Undermining: No Wound Description Classification: Partial Thickness Wound Margin: Flat and Intact Exudate Amount: Small Exudate Type: Sanguinous Exudate Color: red Foul Odor After Cleansing: No Wound Bed Granulation Amount: Large (67-100%) Exposed Structure Granulation Quality: Red Fascia Exposed: No Necrotic Amount: None Present (0%) Fat Layer Exposed: No Tendon Exposed: No Muscle Exposed: No Erisman, Tressy  L. ([id number]) Joint Exposed: No Bone Exposed: No Limited to Skin Breakdown Periwound Skin Texture Texture Color No Abnormalities Noted: No No Abnormalities Noted: No Callus: No Atrophie Blanche: No Crepitus: No Cyanosis: No Excoriation: No Ecchymosis: No Fluctuance: No Erythema: No Friable: No Hemosiderin Staining: No Induration: No Mottled: No Localized Edema: No Pallor: No Rash: No Rubor: No Scarring: No Temperature / Pain Moisture Temperature: No Abnormality No Abnormalities Noted: No Dry / Scaly: No Maceration: No Moist: No Wound Preparation Ulcer Cleansing: Other: soap and water, Topical Anesthetic Applied: None Electronic Signature(s) Signed: 07/13/2016 6:05:10 PM By: Curtis Sites Entered By: Curtis Sites on 07/13/2016 14:07:56 Kozakiewicz, [name] ([id number]) -------------------------------------------------------------------------------- Wound Assessment Details Patient Name: Febus, Jerriann L. Date of Service: 07/13/2016 12:45 PM Medical Record Number: [id number] Patient Account Number: [id number] Date of Birth/Sex: Feb 09, 1956 (60 y.o. Female) Treating RN: Curtis Sites Primary Care Physician: Sherrie Mustache Other Clinician: Referring Physician: Sherrie Mustache Treating Physician/Extender: Kathreen Cosier in Treatment: 29 Wound Status Wound Number: 2 Primary Etiology: Lymphedema Wound Location: Left Lower Leg - Posterior Wound Status: Healed - Epithelialized Wounding Event: Gradually Appeared Date Acquired: 04/19/2016 Weeks Of Treatment: 10 Clustered Wound: Yes Photos Wound Measurements Length: (cm) 0 % Reduction Width: (cm) 0 % Reduction Depth: (cm) 0 Epithelializ Area: (cm) 0 Tunneling: Volume: (cm) 0 Undermining in Area: 100% in Volume: 100% ation: Large (67-100%) No : No Wound Description Classification: Partial Thickness Wound Margin: Flat and Intact Exudate Amount: None Present Foul Odor After Cleansing: No Wound  Bed Granulation Amount: None Present (0%) Exposed Structure Necrotic Amount: None Present (0%) Fascia Exposed: No Fat Layer Exposed: No Tendon  Exposed: No Muscle Exposed: No Joint Exposed: No Bone Exposed: No Steinhardt, Morissa L. ([id number]) Limited to Skin Breakdown Periwound Skin Texture Texture Color No Abnormalities Noted: No No Abnormalities Noted: No Callus: No Atrophie Blanche: No Crepitus: No Cyanosis: No Excoriation: No Ecchymosis: No Fluctuance: No Erythema: No Friable: No Hemosiderin Staining: No Induration: No Mottled: No Localized Edema: No Pallor: No Rash: No Rubor: No Scarring: No Temperature / Pain Moisture Tenderness on Palpation: Yes No Abnormalities Noted: No Dry / Scaly: No Maceration: No Moist: No Wound Preparation Ulcer Cleansing: Other: soap and water, Topical Anesthetic Applied: None Electronic Signature(s) Signed: 07/13/2016 6:05:10 PM By: Curtis Sites Entered By: Curtis Sites on 07/13/2016 14:08:32 Aguila, Amiya Elbert Ewings ([id number]) -------------------------------------------------------------------------------- Wound Assessment Details Patient Name: Blaze, Charelle L. Date of Service: 07/13/2016 12:45 PM Medical Record Number: [id number] Patient Account Number: [id number] Date of Birth/Sex: 1956/02/02 (60 y.o. Female) Treating RN: Curtis Sites Primary Care Physician: Sherrie Mustache Other Clinician: Referring Physician: Sherrie Mustache Treating Physician/Extender: Kathreen Cosier in Treatment: 29 Wound Status Wound Number: 6 Primary Etiology: Venous Leg Ulcer Wound Location: Right Lower Leg - Anterior Wound Status: Healed - Epithelialized Wounding Event: Gradually Appeared Date Acquired: 06/11/2016 Weeks Of Treatment: 4 Clustered Wound: No Photos Wound Measurements Length: (cm) 0 % Reduction Width: (cm) 0 % Reduction Depth: (cm) 0 Epithelializ Area: (cm) 0 Tunneling: Volume: (cm) 0 Undermining in Area: 100% in  Volume: 100% ation: None No : No Wound Description Classification: Partial Thickness Wound Margin: Flat and Intact Exudate Amount: None Present Foul Odor After Cleansing: No Wound Bed Granulation Amount: None Present (0%) Exposed Structure Necrotic Amount: None Present (0%) Fascia Exposed: No Fat Layer Exposed: No Tendon Exposed: No Muscle Exposed: No Joint Exposed: No Bone Exposed: No Eversley, Linnaea L. ([id number]) Limited to Skin Breakdown Periwound Skin Texture Texture Color No Abnormalities Noted: No No Abnormalities Noted: No Callus: No Atrophie Blanche: No Crepitus: No Cyanosis: No Excoriation: No Ecchymosis: No Fluctuance: No Erythema: No Friable: No Hemosiderin Staining: No Induration: No Mottled: No Localized Edema: No Pallor: No Rash: No Rubor: No Scarring: No Temperature / Pain Moisture Tenderness on Palpation: Yes No Abnormalities Noted: No Dry / Scaly: No Maceration: No Moist: No Wound Preparation Ulcer Cleansing: Other: soap and water, Topical Anesthetic Applied: None Electronic Signature(s) Signed: 07/13/2016 6:05:10 PM By: Curtis Sites Entered By: Curtis Sites on 07/13/2016 14:09:08 Rewerts, [place] Elbert Ewings ([id number]) -------------------------------------------------------------------------------- Vitals Details Patient Name: Gensel, Fiora L. Date of Service: 07/13/2016 12:45 PM Medical Record Patient Account Number: [id number] Number: Treating RN: Curtis Sites 11/15/1955 (60 y.o. Other Clinician: Date of Birth/Sex: Female) Treating ROBSON, MICHAEL Primary Care Physician: Sherrie Mustache Physician/Extender: G Referring Physician: Sherrie Mustache Weeks in Treatment: 29 Vital Signs Time Taken: 13:17 Temperature (F): 98.1 Height (in): 62 Pulse (bpm): 107 Weight (lbs): 170 Respiratory Rate (breaths/min): 22 Body Mass Index (BMI): 31.1 Blood Pressure (mmHg): 149/51 Reference Range: 80 - 120 mg / dl Electronic  Signature(s) Signed: 07/13/2016 3:59:34 PM By: Elliot Gurney, RN, [name], [name], [name] Entered By: Elliot Gurney, RN, [name], Kim on 07/13/2016 15:59:34

## 2016-07-16 ENCOUNTER — Emergency Department: Payer: Medicare Other

## 2016-07-16 ENCOUNTER — Emergency Department
Admission: EM | Admit: 2016-07-16 | Discharge: 2016-07-16 | Disposition: A | Discharge status: Home / Self Care | Payer: Medicare Other | Attending: Emergency Medicine | Admitting: Emergency Medicine

## 2016-07-16 ENCOUNTER — Encounter: Payer: Self-pay | Admitting: Emergency Medicine

## 2016-07-16 ENCOUNTER — Other Ambulatory Visit: Payer: Self-pay

## 2016-07-16 DIAGNOSIS — Z87891 Personal history of nicotine dependence: Secondary | ICD-10-CM | POA: Insufficient documentation

## 2016-07-16 DIAGNOSIS — R0602 Shortness of breath: Secondary | ICD-10-CM | POA: Diagnosis not present

## 2016-07-16 DIAGNOSIS — J449 Chronic obstructive pulmonary disease, unspecified: Secondary | ICD-10-CM | POA: Diagnosis not present

## 2016-07-16 DIAGNOSIS — Z79899 Other long term (current) drug therapy: Secondary | ICD-10-CM | POA: Diagnosis not present

## 2016-07-16 HISTORY — DX: Chronic obstructive pulmonary disease, unspecified: J44.9

## 2016-07-16 LAB — BASIC METABOLIC PANEL
ANION GAP: 8 (ref 5–15)
BUN: 12 mg/dL (ref 6–20)
CHLORIDE: 107 mmol/L (ref 101–111)
CO2: 25 mmol/L (ref 22–32)
Calcium: 8.4 mg/dL — ABNORMAL LOW (ref 8.9–10.3)
Creatinine, Ser: 0.99 mg/dL (ref 0.44–1.00)
Glucose, Bld: 79 mg/dL (ref 65–99)
POTASSIUM: 3.2 mmol/L — AB (ref 3.5–5.1)
SODIUM: 140 mmol/L (ref 135–145)

## 2016-07-16 LAB — CBC WITH DIFFERENTIAL/PLATELET
Basophils Absolute: 0 10*3/uL (ref 0–0.1)
Basophils Relative: 1 %
EOS ABS: 0 10*3/uL (ref 0–0.7)
EOS PCT: 0 %
HCT: 37.8 % (ref 35.0–47.0)
HEMOGLOBIN: 12.6 g/dL (ref 12.0–16.0)
LYMPHS ABS: 1.2 10*3/uL (ref 1.0–3.6)
Lymphocytes Relative: 22 %
MCH: 32.4 pg (ref 26.0–34.0)
MCHC: 33.4 g/dL (ref 32.0–36.0)
MCV: 97.1 fL (ref 80.0–100.0)
MONOS PCT: 14 %
Monocytes Absolute: 0.7 10*3/uL (ref 0.2–0.9)
NEUTROS PCT: 63 %
Neutro Abs: 3.4 10*3/uL (ref 1.4–6.5)
Platelets: 131 10*3/uL — ABNORMAL LOW (ref 150–440)
RBC: 3.89 MIL/uL (ref 3.80–5.20)
RDW: 14.9 % — ABNORMAL HIGH (ref 11.5–14.5)
WBC: 5.3 10*3/uL (ref 3.6–11.0)

## 2016-07-16 LAB — BRAIN NATRIURETIC PEPTIDE: B NATRIURETIC PEPTIDE 5: 208 pg/mL — AB (ref 0.0–100.0)

## 2016-07-16 LAB — TROPONIN I

## 2016-07-16 MED ORDER — FUROSEMIDE 10 MG/ML IJ SOLN
40.0000 mg | Freq: Once | INTRAMUSCULAR | Status: AC
  Administered 2016-07-16: 40 mg via INTRAVENOUS
  Filled 2016-07-16: qty 4

## 2016-07-16 NOTE — ED Notes (Signed)
ED Provider at bedside. 

## 2016-07-16 NOTE — ED Provider Notes (Signed)
[facility] Emergency Department Provider Note    ____________________________________________   I have reviewed the triage vital signs and the nursing notes.   HISTORY  Chief Complaint Shortness of Breath   History limited by: Not Limited   HPI [name] is a 60 y.o. female who presents to the emergency department today at the request of home health nurse because of concerns for shortness of breath. The home health nurse had concern that the patient had fluid in her lungs. The patient apparently was recently hospitalized in a hospital in [place]. Family states she was there for 3 days. They're unable sure what exactly was done for however she was therefore breathing difficulties. She does have issues with bilateral lower leg swelling. Patient denies any pain in her chest. No fevers.   Past Medical History:  Diagnosis Date  . COPD (chronic obstructive pulmonary disease) (HCC)   . ILD (interstitial lung disease) (HCC)   . Lupus   . Stroke [facility])     Patient Active Problem List   Diagnosis Date Noted  . Wound infection 06/02/2016  . Chest pain 01/17/2016  . Lupus 01/17/2016  . H/O: stroke 01/17/2016    Past Surgical History:  Procedure Laterality Date  . ABDOMINAL HYSTERECTOMY    . APPENDECTOMY    . TONSILLECTOMY      Prior to Admission medications   Medication Sig Start Date End Date Taking? Authorizing Provider  atorvastatin (LIPITOR) 40 MG tablet Take 1 tablet (40 mg total) by mouth daily at 6 PM. 01/19/16   Wyatt Haste, MD  clopidogrel (PLAVIX) 75 MG tablet Take 75 mg by mouth daily.    Historical Provider, MD  docusate sodium (COLACE) 100 MG capsule Take 1 capsule (100 mg total) by mouth 2 (two) times daily as needed for mild constipation. 06/07/16   Ramonita Lab, MD  furosemide (LASIX) 20 MG tablet Take 20 mg by mouth.    Historical Provider, MD  HYDROcodone-acetaminophen (NORCO/VICODIN) 5-325 MG tablet Take 1 tablet by  mouth every 6 (six) hours as needed for moderate pain. 06/07/16   Ramonita Lab, MD  hydroxychloroquine (PLAQUENIL) 200 MG tablet Take 200 mg by mouth 2 (two) times daily.     Historical Provider, MD  nitroGLYCERIN (NITROSTAT) 0.4 MG SL tablet Place 1 tablet (0.4 mg total) under the tongue every 5 (five) minutes as needed for chest pain. 01/19/16   Wyatt Haste, MD    Allergies Patient has no known allergies.  Family History  Problem Relation Age of Onset  . Hypertension Other     Social History Social History  Substance Use Topics  . Smoking status: Former Games developer  . Smokeless tobacco: Former Neurosurgeon  . Alcohol use No    Review of Systems  Constitutional: Negative for fever. Cardiovascular: Negative for chest pain. Respiratory: Positive for shortness of breath. Gastrointestinal: Negative for abdominal pain, vomiting and diarrhea. Genitourinary: Negative for dysuria. Musculoskeletal: Negative for back pain. Skin: Negative for rash. Neurological: Negative for headaches, focal weakness or numbness.  10-point ROS otherwise negative.  ____________________________________________   PHYSICAL EXAM:  VITAL SIGNS: ED Triage Vitals  Enc Vitals Group     BP 07/16/16 1648 115/61     Pulse Rate 07/16/16 1648 96     Resp 07/16/16 1648 (!) 24     Temp 07/16/16 1648 98.5 F (36.9 C)     Temp Source 07/16/16 1648 Oral     SpO2 07/16/16 1648 100 %  Weight --      Height --      Head Circumference --      Peak Flow --      Pain Score 07/16/16 1657 0   Constitutional: Alert and oriented. Well appearing and in no distress. Eyes: Conjunctivae are normal. Normal extraocular movements. ENT   Head: Normocephalic and atraumatic.   Nose: No congestion/rhinnorhea.   Mouth/Throat: Mucous membranes are moist.   Neck: No stridor. Hematological/Lymphatic/Immunilogical: No cervical lymphadenopathy. Cardiovascular: Normal rate, regular rhythm.  No murmurs, rubs, or gallops.   Respiratory: Normal respiratory effort without tachypnea nor retractions. Rhonchi and crackles at bilateral bases.  Gastrointestinal: Soft and nontender. No distention.  Genitourinary: Deferred Musculoskeletal: Normal range of motion in all extremities. No lower extremity edema. Neurologic:  Normal speech and language. No gross focal neurologic deficits are appreciated.  Skin:  Skin is warm, dry and intact. No rash noted. Psychiatric: Mood and affect are normal. Speech and behavior are normal. Patient exhibits appropriate insight and judgment.  ____________________________________________    LABS (pertinent positives/negatives)  Labs Reviewed  CBC WITH DIFFERENTIAL/PLATELET - Abnormal; Notable for the following:       Result Value   RDW 14.9 (*)    Platelets 131 (*)    All other components within normal limits  BRAIN NATRIURETIC PEPTIDE - Abnormal; Notable for the following:    B Natriuretic Peptide 208.0 (*)    All other components within normal limits  BASIC METABOLIC PANEL - Abnormal; Notable for the following:    Potassium 3.2 (*)    Calcium 8.4 (*)    All other components within normal limits  TROPONIN I     ____________________________________________   EKG  I, Phineas Semen, attending physician, personally viewed and interpreted this EKG  EKG Time: 1719 Rate: 98 Rhythm: normal sinus rhythm Axis: left axis deviation Intervals: qtc 490 QRS: narrow, q waves III, aVF ST changes: no st elevation Impression: abnormal ekg, artifact present   ____________________________________________    RADIOLOGY  CXR IMPRESSION:  No active cardiopulmonary disease. Stable chronic elevation of right  hemidiaphragm.    Severe wedge compression fracture of T11 vertebral body. This is of  indeterminate age, but is new since 01/16/2016.   ____________________________________________   PROCEDURES  Procedures  ____________________________________________   INITIAL  IMPRESSION / ASSESSMENT AND PLAN / ED COURSE  Pertinent labs & imaging results that were available during my care of the patient were reviewed by me and considered in my medical decision making (see chart for details).  Patient presented to the emergency department today because of concerns for shortness of breath and possible fluid in her lungs. On exam she does have some crackles. X-ray however without any overt pulmonary edema. BNP was mildly elevated. This point patient was given a dose of IV Lasix. Patient family felt comfortable going home. Will give heart failure clinic follow-up information.   ____________________________________________   FINAL CLINICAL IMPRESSION(S) / ED DIAGNOSES  Final diagnoses:  SOB (shortness of breath)     Note: This dictation was prepared with Dragon dictation. Any transcriptional errors that result from this process are unintentional    Phineas Semen, MD 07/16/16 2155

## 2016-07-16 NOTE — ED Triage Notes (Addendum)
Patient presents to the ED with shortness of breath.  Patient has home health come to her house and home health RN reported that patient was very congested and RN could not get an oxygen reading on patient.  Patient denies difficulty speaking or shortness of breath although patient is tachypneic at this time and speaking in somewhat short sentences.  No obvious distress noted at this time.  Patient reports wearing oxygen at home but is not wearing oxygen at this time.

## 2016-07-16 NOTE — Discharge Instructions (Signed)
Please seek medical attention for any high fevers, chest pain, shortness of breath, change in behavior, persistent vomiting, bloody stool or any other new or concerning symptoms.  

## 2016-07-16 NOTE — ED Notes (Signed)
Attempted to draw blood x1 

## 2016-07-20 ENCOUNTER — Ambulatory Visit: Payer: Medicare Other | Admitting: Internal Medicine

## 2016-07-27 ENCOUNTER — Encounter: Payer: Self-pay | Admitting: Emergency Medicine

## 2016-07-27 ENCOUNTER — Inpatient Hospital Stay
Admission: EM | Admit: 2016-07-27 | Discharge: 2016-07-30 | DRG: 640 | Disposition: A | Discharge status: Skilled Nursing Facility | Payer: Medicare Other | Attending: Internal Medicine | Admitting: Internal Medicine

## 2016-07-27 DIAGNOSIS — Z7902 Long term (current) use of antithrombotics/antiplatelets: Secondary | ICD-10-CM

## 2016-07-27 DIAGNOSIS — Z87440 Personal history of urinary (tract) infections: Secondary | ICD-10-CM

## 2016-07-27 DIAGNOSIS — F322 Major depressive disorder, single episode, severe without psychotic features: Secondary | ICD-10-CM | POA: Diagnosis present

## 2016-07-27 DIAGNOSIS — R7989 Other specified abnormal findings of blood chemistry: Secondary | ICD-10-CM

## 2016-07-27 DIAGNOSIS — J449 Chronic obstructive pulmonary disease, unspecified: Secondary | ICD-10-CM | POA: Diagnosis present

## 2016-07-27 DIAGNOSIS — Z8249 Family history of ischemic heart disease and other diseases of the circulatory system: Secondary | ICD-10-CM | POA: Diagnosis not present

## 2016-07-27 DIAGNOSIS — Z79899 Other long term (current) drug therapy: Secondary | ICD-10-CM | POA: Diagnosis not present

## 2016-07-27 DIAGNOSIS — F4320 Adjustment disorder, unspecified: Secondary | ICD-10-CM | POA: Diagnosis present

## 2016-07-27 DIAGNOSIS — F32A Depression, unspecified: Secondary | ICD-10-CM

## 2016-07-27 DIAGNOSIS — M329 Systemic lupus erythematosus, unspecified: Secondary | ICD-10-CM | POA: Diagnosis present

## 2016-07-27 DIAGNOSIS — E039 Hypothyroidism, unspecified: Secondary | ICD-10-CM | POA: Diagnosis present

## 2016-07-27 DIAGNOSIS — J849 Interstitial pulmonary disease, unspecified: Secondary | ICD-10-CM | POA: Diagnosis present

## 2016-07-27 DIAGNOSIS — M069 Rheumatoid arthritis, unspecified: Secondary | ICD-10-CM | POA: Diagnosis present

## 2016-07-27 DIAGNOSIS — Z8673 Personal history of transient ischemic attack (TIA), and cerebral infarction without residual deficits: Secondary | ICD-10-CM

## 2016-07-27 DIAGNOSIS — F329 Major depressive disorder, single episode, unspecified: Secondary | ICD-10-CM

## 2016-07-27 DIAGNOSIS — E876 Hypokalemia: Secondary | ICD-10-CM | POA: Diagnosis present

## 2016-07-27 DIAGNOSIS — E43 Unspecified severe protein-calorie malnutrition: Secondary | ICD-10-CM | POA: Diagnosis present

## 2016-07-27 DIAGNOSIS — N390 Urinary tract infection, site not specified: Secondary | ICD-10-CM | POA: Diagnosis present

## 2016-07-27 DIAGNOSIS — Z6831 Body mass index (BMI) 31.0-31.9, adult: Secondary | ICD-10-CM

## 2016-07-27 DIAGNOSIS — Z87891 Personal history of nicotine dependence: Secondary | ICD-10-CM

## 2016-07-27 DIAGNOSIS — R627 Adult failure to thrive: Secondary | ICD-10-CM | POA: Diagnosis not present

## 2016-07-27 DIAGNOSIS — E785 Hyperlipidemia, unspecified: Secondary | ICD-10-CM | POA: Diagnosis present

## 2016-07-27 DIAGNOSIS — I10 Essential (primary) hypertension: Secondary | ICD-10-CM | POA: Diagnosis present

## 2016-07-27 DIAGNOSIS — F4321 Adjustment disorder with depressed mood: Secondary | ICD-10-CM

## 2016-07-27 LAB — COMPREHENSIVE METABOLIC PANEL
ALT: 41 U/L (ref 14–54)
ANION GAP: 9 (ref 5–15)
AST: 107 U/L — AB (ref 15–41)
Albumin: 2.6 g/dL — ABNORMAL LOW (ref 3.5–5.0)
Alkaline Phosphatase: 225 U/L — ABNORMAL HIGH (ref 38–126)
BILIRUBIN TOTAL: 1.6 mg/dL — AB (ref 0.3–1.2)
BUN: 11 mg/dL (ref 6–20)
CHLORIDE: 107 mmol/L (ref 101–111)
CO2: 23 mmol/L (ref 22–32)
Calcium: 8.2 mg/dL — ABNORMAL LOW (ref 8.9–10.3)
Creatinine, Ser: 0.79 mg/dL (ref 0.44–1.00)
GFR calc Af Amer: >60 mL/min (ref >60)
Glucose, Bld: 52 mg/dL — ABNORMAL LOW (ref 65–99)
POTASSIUM: 3.7 mmol/L (ref 3.5–5.1)
Sodium: 139 mmol/L (ref 135–145)
TOTAL PROTEIN: 6.9 g/dL (ref 6.5–8.1)

## 2016-07-27 LAB — CBC
HCT: 38.9 % (ref 35.0–47.0)
Hemoglobin: 12.7 g/dL (ref 12.0–16.0)
MCH: 31.3 pg (ref 26.0–34.0)
MCHC: 32.7 g/dL (ref 32.0–36.0)
MCV: 95.9 fL (ref 80.0–100.0)
PLATELETS: 153 10*3/uL (ref 150–440)
RBC: 4.05 MIL/uL (ref 3.80–5.20)
RDW: 14.7 % — AB (ref 11.5–14.5)
WBC: 3.7 10*3/uL (ref 3.6–11.0)

## 2016-07-27 LAB — URINALYSIS, COMPLETE (UACMP) WITH MICROSCOPIC
GLUCOSE, UA: NEGATIVE mg/dL
KETONES UR: 5 mg/dL — AB
NITRITE: NEGATIVE
PH: 5 (ref 5.0–8.0)
Protein, ur: 100 mg/dL — AB
RBC / HPF: NONE SEEN RBC/hpf (ref 0–5)
SPECIFIC GRAVITY, URINE: 1.021 (ref 1.005–1.030)

## 2016-07-27 LAB — GLUCOSE, CAPILLARY
GLUCOSE-CAPILLARY: 103 mg/dL — AB (ref 65–99)
Glucose-Capillary: 65 mg/dL (ref 65–99)

## 2016-07-27 MED ORDER — ACETAMINOPHEN 650 MG RE SUPP: 650.0000 mg | Freq: Four times a day (QID) | RECTAL | Status: DC | PRN

## 2016-07-27 MED ORDER — ATORVASTATIN CALCIUM 20 MG PO TABS
40.0000 mg | ORAL_TABLET | Freq: Every day | ORAL | Status: DC
  Administered 2016-07-28 – 2016-07-29 (×2): 40 mg via ORAL
  Filled 2016-07-27 (×2): qty 2

## 2016-07-27 MED ORDER — SODIUM CHLORIDE 0.9 % IV SOLN
INTRAVENOUS | Status: DC
  Administered 2016-07-28: 02:00:00 via INTRAVENOUS

## 2016-07-27 MED ORDER — CLOPIDOGREL BISULFATE 75 MG PO TABS
75.0000 mg | ORAL_TABLET | Freq: Every day | ORAL | Status: DC
  Administered 2016-07-28 – 2016-07-30 (×3): 75 mg via ORAL
  Filled 2016-07-27 (×3): qty 1

## 2016-07-27 MED ORDER — ONDANSETRON HCL 4 MG/2ML IJ SOLN: 4.0000 mg | Freq: Four times a day (QID) | INTRAMUSCULAR | Status: DC | PRN

## 2016-07-27 MED ORDER — HYDROCODONE-ACETAMINOPHEN 5-325 MG PO TABS
1.0000 | ORAL_TABLET | Freq: Four times a day (QID) | ORAL | Status: DC | PRN
Start: 2016-07-27 — End: 2016-07-29
  Administered 2016-07-28 – 2016-07-29 (×3): 1 via ORAL
  Filled 2016-07-27 (×3): qty 1

## 2016-07-27 MED ORDER — ENOXAPARIN SODIUM 40 MG/0.4ML ~~LOC~~ SOLN
40.0000 mg | SUBCUTANEOUS | Status: DC
  Administered 2016-07-28 – 2016-07-29 (×2): 40 mg via SUBCUTANEOUS
  Filled 2016-07-27 (×2): qty 0.4

## 2016-07-27 MED ORDER — FUROSEMIDE 40 MG PO TABS
20.0000 mg | ORAL_TABLET | Freq: Every day | ORAL | Status: DC
  Administered 2016-07-28: 20 mg via ORAL
  Filled 2016-07-27: qty 2

## 2016-07-27 MED ORDER — CEFTRIAXONE SODIUM-DEXTROSE 1-3.74 GM-% IV SOLR
1.0000 g | Freq: Once | INTRAVENOUS | Status: AC
  Administered 2016-07-27: 1 g via INTRAVENOUS
  Filled 2016-07-27: qty 50

## 2016-07-27 MED ORDER — MAGNESIUM CITRATE PO SOLN
1.0000 | Freq: Once | ORAL | Status: DC | PRN
  Filled 2016-07-27: qty 296

## 2016-07-27 MED ORDER — HYDROXYCHLOROQUINE SULFATE 200 MG PO TABS
200.0000 mg | ORAL_TABLET | Freq: Two times a day (BID) | ORAL | Status: DC
  Administered 2016-07-28 – 2016-07-30 (×6): 200 mg via ORAL
  Filled 2016-07-27 (×6): qty 1

## 2016-07-27 MED ORDER — ACETAMINOPHEN 325 MG PO TABS
650.0000 mg | ORAL_TABLET | Freq: Four times a day (QID) | ORAL | Status: DC | PRN
  Filled 2016-07-27: qty 2

## 2016-07-27 MED ORDER — SENNOSIDES-DOCUSATE SODIUM 8.6-50 MG PO TABS: 1.0000 | ORAL_TABLET | Freq: Every evening | ORAL | Status: DC | PRN

## 2016-07-27 MED ORDER — ALBUTEROL SULFATE (2.5 MG/3ML) 0.083% IN NEBU: 2.5000 mg | INHALATION_SOLUTION | Freq: Four times a day (QID) | RESPIRATORY_TRACT | Status: DC | PRN

## 2016-07-27 MED ORDER — CEFTRIAXONE SODIUM-DEXTROSE 1-3.74 GM-% IV SOLR
INTRAVENOUS | Status: AC
  Administered 2016-07-27: 1 g via INTRAVENOUS
  Filled 2016-07-27: qty 50

## 2016-07-27 MED ORDER — BISACODYL 5 MG PO TBEC: 5.0000 mg | DELAYED_RELEASE_TABLET | Freq: Every day | ORAL | Status: DC | PRN

## 2016-07-27 MED ORDER — NITROGLYCERIN 0.4 MG SL SUBL: 0.4000 mg | SUBLINGUAL_TABLET | SUBLINGUAL | Status: DC | PRN

## 2016-07-27 MED ORDER — CEFTRIAXONE SODIUM 1 G IJ SOLR: 1.0000 g | Freq: Once | INTRAMUSCULAR | Status: DC

## 2016-07-27 MED ORDER — DOCUSATE SODIUM 100 MG PO CAPS: 100.0000 mg | ORAL_CAPSULE | Freq: Two times a day (BID) | ORAL | Status: DC | PRN

## 2016-07-27 MED ORDER — ONDANSETRON HCL 4 MG PO TABS: 4.0000 mg | ORAL_TABLET | Freq: Four times a day (QID) | ORAL | Status: DC | PRN

## 2016-07-27 NOTE — ED Notes (Signed)
Pt pleasant, confused. 3LNC sats >95. BS shallow, with course crackles. Pitted edema BLE

## 2016-07-27 NOTE — ED Provider Notes (Signed)
[facility] Emergency Department Provider Note   ____________________________________________   I have reviewed the triage vital signs and the nursing notes.   HISTORY  Chief Complaint Feel bad  History limited by: Poor historian, there is a note from social worker present   HPI [name] is a 60 y.o. female who presents to the emergency department today at the request of social worker. The patient herself states that she feels bad. She denies any medical complaints but does state she is lonely. Additionally she states that she has lost her appetite and does not sleep well. However when asked if she was sad or depressed patient denied this. Stated she was happy. She denies any SI or HI. SW's note mentions poor appetite as well as inconsistent oxygen use, and states that she needs intensive inpatient psychiatric admission to prevent her from becoming a hospice patient, however does not expand nor explain this statement. Does not state any reference to current or attempted psychiatric care.   Past Medical History:  Diagnosis Date  . COPD (chronic obstructive pulmonary disease) (HCC)   . ILD (interstitial lung disease) (HCC)   . Lupus   . Stroke [facility])     Patient Active Problem List   Diagnosis Date Noted  . Wound infection 06/02/2016  . Chest pain 01/17/2016  . Lupus 01/17/2016  . H/O: stroke 01/17/2016    Past Surgical History:  Procedure Laterality Date  . ABDOMINAL HYSTERECTOMY    . APPENDECTOMY    . TONSILLECTOMY      Prior to Admission medications   Medication Sig Start Date End Date Taking? Authorizing Provider  atorvastatin (LIPITOR) 40 MG tablet Take 1 tablet (40 mg total) by mouth daily at 6 PM. 01/19/16   Wyatt Haste, MD  clopidogrel (PLAVIX) 75 MG tablet Take 75 mg by mouth daily.    Historical Provider, MD  docusate sodium (COLACE) 100 MG capsule Take 1 capsule (100 mg total) by mouth 2 (two) times daily as needed for mild  constipation. 06/07/16   Ramonita Lab, MD  furosemide (LASIX) 20 MG tablet Take 20 mg by mouth.    Historical Provider, MD  HYDROcodone-acetaminophen (NORCO/VICODIN) 5-325 MG tablet Take 1 tablet by mouth every 6 (six) hours as needed for moderate pain. 06/07/16   Ramonita Lab, MD  hydroxychloroquine (PLAQUENIL) 200 MG tablet Take 200 mg by mouth 2 (two) times daily.     Historical Provider, MD  nitroGLYCERIN (NITROSTAT) 0.4 MG SL tablet Place 1 tablet (0.4 mg total) under the tongue every 5 (five) minutes as needed for chest pain. 01/19/16   Wyatt Haste, MD    Allergies Patient has no known allergies.  Family History  Problem Relation Age of Onset  . Hypertension Other     Social History Social History  Substance Use Topics  . Smoking status: Former Games developer  . Smokeless tobacco: Former Neurosurgeon  . Alcohol use No    Review of Systems  Constitutional: Negative for fever. Cardiovascular: Negative for chest pain. Respiratory: Negative for shortness of breath. Gastrointestinal: Negative for abdominal pain, vomiting and diarrhea. Genitourinary: Negative for dysuria. Musculoskeletal: Negative for back pain. Skin: Negative for rash. Neurological: Negative for headaches, focal weakness or numbness. Psychiatric: Patient denies SI/HI.  10-point ROS otherwise negative.  ____________________________________________   PHYSICAL EXAM:  VITAL SIGNS: ED Triage Vitals [07/27/16 1435]  Enc Vitals Group     BP 121/73     Pulse Rate 92     Resp 20  Temp 97.3 F (36.3 C)     Temp Source Oral     SpO2 100 %     Weight 170 lb (77.1 kg)     Height 5\' 2"  (1.575 m)    Constitutional: Alert and oriented. Well appearing and in no distress. Eyes: Conjunctivae are normal. Normal extraocular movements. ENT   Head: Normocephalic and atraumatic.   Nose: No congestion/rhinnorhea. Nasal canuli in place.   Mouth/Throat: Mucous membranes are moist.   Neck: No  stridor. Hematological/Lymphatic/Immunilogical: No cervical lymphadenopathy. Cardiovascular: Normal rate, regular rhythm.  No murmurs, rubs, or gallops.  Respiratory: Normal respiratory effort without tachypnea nor retractions. Breath sounds are clear and equal bilaterally. No wheezes/rales/rhonchi. Gastrointestinal: Soft and non tender. No rebound. No guarding.  Genitourinary: Deferred Musculoskeletal: Normal range of motion in all extremities. Bilateral lower legs wrapped. Neurologic:  Normal speech and language. No gross focal neurologic deficits are appreciated.  Skin:  Skin is warm, dry and intact. No rash noted. Psychiatric: Mood and affect are normal. Speech and behavior are normal. Patient exhibits appropriate insight and judgment.  ____________________________________________    LABS (pertinent positives/negatives)  Labs Reviewed  CBC - Abnormal; Notable for the following:       Result Value   RDW 14.7 (*)    All other components within normal limits  COMPREHENSIVE METABOLIC PANEL - Abnormal; Notable for the following:    Glucose, Bld 52 (*)    Calcium 8.2 (*)    Albumin 2.6 (*)    AST 107 (*)    Alkaline Phosphatase 225 (*)    Total Bilirubin 1.6 (*)    All other components within normal limits  URINALYSIS, COMPLETE (UACMP) WITH MICROSCOPIC - Abnormal; Notable for the following:    Color, Urine AMBER (*)    APPearance CLOUDY (*)    Hgb urine dipstick MODERATE (*)    Bilirubin Urine SMALL (*)    Ketones, ur 5 (*)    Protein, ur 100 (*)    Leukocytes, UA TRACE (*)    Bacteria, UA MANY (*)    Squamous Epithelial / LPF 6-30 (*)    All other components within normal limits  GLUCOSE, CAPILLARY - Abnormal; Notable for the following:    Glucose-Capillary 103 (*)    All other components within normal limits  URINE CULTURE  GLUCOSE, CAPILLARY     ____________________________________________   EKG  None  ____________________________________________     RADIOLOGY  None  ____________________________________________   PROCEDURES  Procedures  ____________________________________________   INITIAL IMPRESSION / ASSESSMENT AND PLAN / ED COURSE  Pertinent labs & imaging results that were available during my care of the patient were reviewed by me and considered in my medical decision making (see chart for details).  Patient presented to the emergency department today with concerns for failure to thrive. Workup was consistent with UTI. I do think this could be explained some of the patient's symptoms. Will be admitted to the hospital service.  ____________________________________________   FINAL CLINICAL IMPRESSION(S) / ED DIAGNOSES  Final diagnoses:  Urinary tract infection without hematuria, site unspecified  Depression, unspecified depression type     Note: This dictation was prepared with Dragon dictation. Any transcriptional errors that result from this process are unintentional     , MD 07/27/16 2300

## 2016-07-27 NOTE — ED Triage Notes (Signed)
Pt to ed via ems from home with reports of her home health nurse stating that pt has had failure to thrive since her mother passed away last mth. Pt states she doesn't eat much d/t not having an appetite. Pt denies any depressed thoughts and or SI/HI at this time.  Home health sent  A note with pt stating that she needs intensive in pt pyschiatric tx so that  She does not end up at hospice. Pt denies any accusations.

## 2016-07-27 NOTE — Care Management Note (Signed)
Case Management Note  Patient Details  Name: [name] MRN: [id number] Date of Birth: Sep 05, 1955  Subjective/Objective: E-mail from [facility] states the patient has not been eating at home    , not using her home 02, and is depressed. They have a  MSW, Joanne Chars at [phone].She can be contacted for info.               Action/Plan:   Expected Discharge Date:                  Expected Discharge Plan:     In-House Referral:     Discharge planning Services     Post Acute Care Choice:    Choice offered to:     DME Arranged:    DME Agency:     HH Arranged:    HH Agency:     Status of Service:     If discussed at [employer] of Stay Meetings, dates discussed:    Additional Comments:  Berna Bue, RN 07/27/2016, 2:18 PM

## 2016-07-28 LAB — BASIC METABOLIC PANEL
ANION GAP: 6 (ref 5–15)
BUN: 11 mg/dL (ref 6–20)
CALCIUM: 7.7 mg/dL — AB (ref 8.9–10.3)
CO2: 23 mmol/L (ref 22–32)
Chloride: 107 mmol/L (ref 101–111)
Creatinine, Ser: 0.68 mg/dL (ref 0.44–1.00)
GFR calc Af Amer: >60 mL/min (ref >60)
GLUCOSE: 82 mg/dL (ref 65–99)
Potassium: 2.9 mmol/L — ABNORMAL LOW (ref 3.5–5.1)
SODIUM: 136 mmol/L (ref 135–145)

## 2016-07-28 LAB — CBC
HCT: 33.5 % — ABNORMAL LOW (ref 35.0–47.0)
Hemoglobin: 11.1 g/dL — ABNORMAL LOW (ref 12.0–16.0)
MCH: 31.5 pg (ref 26.0–34.0)
MCHC: 33.3 g/dL (ref 32.0–36.0)
MCV: 94.6 fL (ref 80.0–100.0)
PLATELETS: 120 10*3/uL — AB (ref 150–440)
RBC: 3.54 MIL/uL — ABNORMAL LOW (ref 3.80–5.20)
RDW: 14.7 % — AB (ref 11.5–14.5)
WBC: 3.2 10*3/uL — ABNORMAL LOW (ref 3.6–11.0)

## 2016-07-28 LAB — TSH: TSH: 8.242 u[IU]/mL — AB (ref 0.350–4.500)

## 2016-07-28 MED ORDER — LEVOTHYROXINE SODIUM 50 MCG PO TABS
50.0000 ug | ORAL_TABLET | Freq: Every day | ORAL | Status: DC
  Administered 2016-07-29 – 2016-07-30 (×2): 50 ug via ORAL
  Filled 2016-07-28 (×2): qty 1

## 2016-07-28 MED ORDER — CEFTRIAXONE SODIUM-DEXTROSE 2-2.22 GM-% IV SOLR
2.0000 g | INTRAVENOUS | Status: DC
  Filled 2016-07-28: qty 50

## 2016-07-28 MED ORDER — POTASSIUM CHLORIDE CRYS ER 20 MEQ PO TBCR
40.0000 meq | EXTENDED_RELEASE_TABLET | ORAL | Status: AC
  Administered 2016-07-28 (×2): 40 meq via ORAL
  Filled 2016-07-28 (×2): qty 2

## 2016-07-28 MED ORDER — CEFTRIAXONE SODIUM-DEXTROSE 1-3.74 GM-% IV SOLR
1.0000 g | INTRAVENOUS | Status: DC
  Administered 2016-07-28: 1 g via INTRAVENOUS
  Filled 2016-07-28 (×2): qty 50

## 2016-07-28 MED ORDER — ENSURE ENLIVE PO LIQD
237.0000 mL | Freq: Two times a day (BID) | ORAL | Status: DC
  Administered 2016-07-29 – 2016-07-30 (×3): 237 mL via ORAL

## 2016-07-28 NOTE — Progress Notes (Signed)
Clinical Child psychotherapist (CSW) received SNF consult, will wait for PT recommendation.  [employer], LCSW [phone]

## 2016-07-28 NOTE — Progress Notes (Signed)
Pt is edematous bilateral extremities with discoloration. Paged Dr Tobi Bastos to see if he wants to discontinue fluids. Received order to hold fluids if pt is eating and drinking. Pt is requesting a psych consult.

## 2016-07-28 NOTE — Progress Notes (Signed)
admitted because of UTI, hypokalemia. Patient seen but has flat affect. Poor by mouth intake. She is also admitted for failure to thrive. Vitals are stable. She is on oxygen at home at 3 L.  Assessment and plan severe hypokalemia secondary to poor by mouth intake: Replace the potassium  UTI: Follow urine cultures, started on Rocephin. Continue  #3 depression, flat affect: Patient needs to be seen by psychiatric.  #4 severe malnutrition: Continue nutritional supplements.  5 right lower oximetry wants currently on Unna boots, wound care. History of lung cancer, COPD, CVA: #6 history of CVA: Continue statins, Plavix  CODE STATUS full code. Time spent 25 minutes

## 2016-07-28 NOTE — Care Management (Signed)
Received message that patient was open to [facility]. Notified Chip Boer with [facility] that patient is going to SNF.

## 2016-07-28 NOTE — Progress Notes (Signed)
Confirmed with [facility] [phone]) that her policy [phone]) with the carrier termed on 06/15/16.

## 2016-07-28 NOTE — NC FL2 (Signed)
[place] MEDICAID FL2 LEVEL OF CARE SCREENING TOOL     IDENTIFICATION  Patient Name: [name] Birthdate: 06/15/1956 Sex: female Admission Date (Current Location): 07/27/2016  [facility] and [place] Number:  [address]  ([id number]) Facility and Address:  [facility], [address], [place], [place] [address]      Provider Number: [id number]  Attending Physician Name and Address:  Katha Hamming, MD  Relative Name and Phone Number:       Current Level of Care: Hospital Recommended Level of Care: Skilled Nursing Facility Prior Approval Number:    Date Approved/Denied:   PASRR Number:  ( [id number] )  Discharge Plan: SNF    Current Diagnoses: Patient Active Problem List   Diagnosis Date Noted  . UTI (urinary tract infection) 07/27/2016  . Wound infection 06/02/2016  . Chest pain 01/17/2016  . Lupus 01/17/2016  . H/O: stroke 01/17/2016    Orientation RESPIRATION BLADDER Height & Weight     Self, Time, Situation, Place  O2 (3 Liters Oxygen ) Incontinent Weight: 173 lb 9.6 oz (78.7 kg) Height:  5\' 2"  (157.5 cm)  BEHAVIORAL SYMPTOMS/MOOD NEUROLOGICAL BOWEL NUTRITION STATUS   (none)  (none) Continent Diet (Diet: Heart Healthy )  AMBULATORY STATUS COMMUNICATION OF NEEDS Skin   Extensive Assist Verbally Normal                       Personal Care Assistance Level of Assistance  Bathing, Feeding, Dressing Bathing Assistance: Limited assistance Feeding assistance: Independent Dressing Assistance: Limited assistance     Functional Limitations Info  Sight, Hearing, Speech Sight Info: Adequate Hearing Info: Adequate Speech Info: Adequate    SPECIAL CARE FACTORS FREQUENCY  PT (By licensed PT), OT (By licensed OT)     PT Frequency:  (5) OT Frequency:  (5)            Contractures      Additional Factors Info  Code Status, Allergies Code Status Info:  (Full Code. ) Allergies Info:  (No Known Allergies. )           Current Medications (07/28/2016):  This is the current hospital active medication list Current Facility-Administered Medications  Medication Dose Route Frequency Provider Last Rate Last Dose  . acetaminophen (TYLENOL) tablet 650 mg  650 mg Oral Q6H PRN Alexis Hugelmeyer, DO       Or  . acetaminophen (TYLENOL) suppository 650 mg  650 mg Rectal Q6H PRN Alexis Hugelmeyer, DO      . albuterol (PROVENTIL) (2.5 MG/3ML) 0.083% nebulizer solution 2.5 mg  2.5 mg Nebulization Q6H PRN Alexis Hugelmeyer, DO      . atorvastatin (LIPITOR) tablet 40 mg  40 mg Oral q1800 Alexis Hugelmeyer, DO      . bisacodyl (DULCOLAX) EC tablet 5 mg  5 mg Oral Daily PRN Alexis Hugelmeyer, DO      . cefTRIAXone (ROCEPHIN) IVPB 2 g  2 g Intravenous Q24H Alexis Hugelmeyer, DO      . clopidogrel (PLAVIX) tablet 75 mg  75 mg Oral Daily Alexis Hugelmeyer, DO      . docusate sodium (COLACE) capsule 100 mg  100 mg Oral BID PRN Alexis Hugelmeyer, DO      . enoxaparin (LOVENOX) injection 40 mg  40 mg Subcutaneous Q24H Alexis Hugelmeyer, DO      . furosemide (LASIX) tablet 20 mg  20 mg Oral Daily Alexis Hugelmeyer, DO      . HYDROcodone-acetaminophen (NORCO/VICODIN) 5-325 MG per tablet 1  tablet  1 tablet Oral Q6H PRN Alexis Hugelmeyer, DO   1 tablet at 07/28/16 0158  . hydroxychloroquine (PLAQUENIL) tablet 200 mg  200 mg Oral BID Alexis Hugelmeyer, DO   200 mg at 07/28/16 0158  . magnesium citrate solution 1 Bottle  1 Bottle Oral Once PRN Alexis Hugelmeyer, DO      . nitroGLYCERIN (NITROSTAT) SL tablet 0.4 mg  0.4 mg Sublingual Q5 min PRN Alexis Hugelmeyer, DO      . ondansetron (ZOFRAN) tablet 4 mg  4 mg Oral Q6H PRN Alexis Hugelmeyer, DO       Or  . ondansetron (ZOFRAN) injection 4 mg  4 mg Intravenous Q6H PRN Alexis Hugelmeyer, DO      . potassium chloride SA (K-DUR,KLOR-CON) CR tablet 40 mEq  40 mEq Oral Q4H Katha Hamming, MD      . senna-docusate (Senokot-S) tablet 1 tablet  1 tablet Oral QHS PRN Alexis Hugelmeyer, DO          Discharge Medications: Please see discharge summary for a list of discharge medications.  Relevant Imaging Results:  Relevant Lab Results:   Additional Information  (SSN: [id number])  Florabelle Cardin, Darleen Crocker, LCSW

## 2016-07-28 NOTE — Care Management (Signed)
Met with patient at bedside. She states she lives with a friend. She states she will be discharged to a group home. She has [employer] but is unable to [employer]. CSW updated on plans for group home

## 2016-07-28 NOTE — Progress Notes (Signed)
Pharmacy Antibiotic Note  SOLENE HEREFORD is a 60 y.o. female admitted on 07/27/2016 with UTI.  Pharmacy has been consulted for cetriaxone dosing.  Plan: Will adjust dose to Ceftriaxone 1 gram IV q 24 hours  Height: 5\' 2"  (157.5 cm) Weight: 173 lb 9.6 oz (78.7 kg) IBW/kg (Calculated) : 50.1  Temp (24hrs), Avg:97.9 F (36.6 C), Min:97.3 F (36.3 C), Max:98.6 F (37 C)   Recent Labs Lab 07/27/16 1654 07/28/16 0349  WBC 3.7 3.2*  CREATININE 0.79 0.68    Estimated Creatinine Clearance: 71.7 mL/min (by C-G formula based on SCr of 0.68 mg/dL).    No Known Allergies  Antimicrobials this admission: ceftriaxone >> 12/12   >>   Dose adjustments this admission:   Microbiology results: 12/12 UCx: pending    Thank you for allowing pharmacy to be a part of this patient's care.  07/30/16 07/28/2016 10:13 AM

## 2016-07-28 NOTE — Clinical Social Work Placement (Signed)
   CLINICAL SOCIAL WORK PLACEMENT  NOTE  Date:  07/28/2016  Patient Details  Name: [name] MRN: [id number] Date of Birth: 1955-11-17  Clinical Social Work is seeking post-discharge placement for this patient at the Skilled  Nursing Facility level of care (*CSW will initial, date and re-position this form in  chart as items are completed):  Yes   Patient/family provided with [facility] Clinical Social Work Department's list of facilities offering this level of care within the geographic area requested by the patient (or if unable, by the patient's family).  Yes   Patient/family informed of their freedom to choose among providers that offer the needed level of care, that participate in Medicare, Medicaid or managed care program needed by the patient, have an available bed and are willing to accept the patient.  Yes   Patient/family informed of [facility]'s ownership interest in [facility] and [facility], as well as of the fact that they are under no obligation to receive care at these facilities.  PASRR submitted to EDS on       PASRR number received on       Existing PASRR number confirmed on 07/28/16     FL2 transmitted to all facilities in geographic area requested by pt/family on 07/28/16     FL2 transmitted to all facilities within larger geographic area on       Patient informed that his/her managed care company has contracts with or will negotiate with certain facilities, including the following:        Yes   Patient/family informed of bed offers received.  Patient chooses bed at       Physician recommends and patient chooses bed at      Patient to be transferred to   on  .  Patient to be transferred to facility by       Patient family notified on   of transfer.  Name of family member notified:        PHYSICIAN       Additional Comment:    _______________________________________________ Tameyah Koch, Darleen Crocker, LCSW 07/28/2016, 6:51 PM

## 2016-07-28 NOTE — H&P (Signed)
[facility] - [place] @ [facility] Admission History and Physical [employer], D.O.  ---------------------------------------------------------------------------------------------------------------------   PATIENT NAME: [name] MR#: [id number] DATE OF BIRTH: 1956-06-22 DATE OF ADMISSION: 07/27/2016 PRIMARY CARE PHYSICIAN: Sherrie Mustache  REQUESTING/REFERRING PHYSICIAN: ED Dr. Derrill Kay  CHIEF COMPLAINT: Chief Complaint  Patient presents with  . Failure To Thrive    HISTORY OF PRESENT ILLNESS:   [name] is a 60 y.o. female with a known history ofLupus, CVA, interstitial lung disease and COPD, rheumatoid arthritis, hyperlipidemia, hypertension, lower extremity edema presents to the emergency department referred by social worker for evaluation of failure to thrive.  Per the patient she was in her usual state of health until last week when her mother passed away after a lengthy battle with lung cancer. She states that since then she has had a decreased appetite, trouble sleeping and depression. She states that she lives with "a woman" who helps her. There is a note on the chart from the social worker requesting inpatient psychiatric admission but does not describe any current psychiatric care. The patient herself denies any suicidal ideation or homicidal ideation. States that she just said that her mother passed away.   Otherwise there has been no change in status. Patient has been taking medication as prescribed and there has been no recent change in medication or diet.  There has been no recent illness, travel or sick contacts.    Patient denies fevers/chills, weakness, dizziness, chest pain, shortness of breath, N/V/C/D, abdominal pain, dysuria/frequency, changes in mental status.   PAST MEDICAL HISTORY: Past Medical History:  Diagnosis Date  . COPD (chronic obstructive pulmonary disease) (HCC)   . ILD (interstitial lung disease) (HCC)   . Lupus   . Stroke [facility])        PAST SURGICAL HISTORY: Past Surgical History:  Procedure Laterality Date  . ABDOMINAL HYSTERECTOMY    . APPENDECTOMY    . TONSILLECTOMY        SOCIAL HISTORY: Social History  Substance Use Topics  . Smoking status: Former Games developer  . Smokeless tobacco: Former Neurosurgeon  . Alcohol use No      FAMILY HISTORY: Family History  Problem Relation Age of Onset  . Hypertension Other   Mother deceased at age 54s of lung cancer    MEDICATIONS AT HOME: Prior to Admission medications   Medication Sig Start Date End Date Taking? Authorizing Provider  atorvastatin (LIPITOR) 40 MG tablet Take 1 tablet (40 mg total) by mouth daily at 6 PM. 01/19/16  Yes Wyatt Haste, MD  clopidogrel (PLAVIX) 75 MG tablet Take 75 mg by mouth daily.   Yes Historical Provider, MD  docusate sodium (COLACE) 100 MG capsule Take 1 capsule (100 mg total) by mouth 2 (two) times daily as needed for mild constipation. 06/07/16  Yes Ramonita Lab, MD  furosemide (LASIX) 20 MG tablet Take 20 mg by mouth.   Yes Historical Provider, MD  hydroxychloroquine (PLAQUENIL) 200 MG tablet Take 200 mg by mouth 2 (two) times daily.    Yes Historical Provider, MD  nitroGLYCERIN (NITROSTAT) 0.4 MG SL tablet Place 1 tablet (0.4 mg total) under the tongue every 5 (five) minutes as needed for chest pain. 01/19/16  Yes Wyatt Haste, MD  HYDROcodone-acetaminophen (NORCO/VICODIN) 5-325 MG tablet Take 1 tablet by mouth every 6 (six) hours as needed for moderate pain. Patient not taking: Reported on 07/27/2016 06/07/16   Ramonita Lab, MD      DRUG ALLERGIES: No Known Allergies   REVIEW  OF SYSTEMS: CONSTITUTIONAL: No fatigue, weakness, fever, chills, weight gain/loss, headache. positive decreased appetite  EYES: No blurry or double vision. ENT: No tinnitus, postnasal drip, redness or soreness of the oropharynx. RESPIRATORY: No dyspnea, cough, wheeze, hemoptysis. CARDIOVASCULAR: No chest pain, orthopnea, palpitations,  syncope. GASTROINTESTINAL: No nausea, vomiting, constipation, diarrhea, abdominal pain. No hematemesis, melena or hematochezia. GENITOURINARY: No dysuria, frequency, hematuria. ENDOCRINE: No polyuria or nocturia. No heat or cold intolerance. HEMATOLOGY: No anemia, bruising, bleeding. INTEGUMENTARY: No rashes, ulcers, lesions. MUSCULOSKELETAL: No pain, arthritis, swelling, gout. NEUROLOGIC: No numbness, tingling, weakness or ataxia. No seizure-type activity. PSYCHIATRICPositive depression, insomnia.  PHYSICAL EXAMINATION: VITAL SIGNS: Blood pressure (!) 110/42, pulse (!) 105, temperature 98.6 F (37 C), temperature source Oral, resp. rate 18, height 5\' 2"  (1.575 m), weight 78.7 kg (173 lb 9.6 oz), SpO2 100 %.  GENERAL: 60 y.o.-year-old  black female patient, well-developed, well-nourished lying in the bed in no acute distress.  Pleasant and cooperative.   patient appears older than stated age HEENT: Head atraumatic, normocephalic. Pupils equal, round, reactive to light and accommodation. No scleral icterus. right eye is injected. Extraocular muscles intact. Oropharynx is clear. Mucus membranes moist. NECK: Supple, full range of motion. No JVD, no bruit heard. No cervical lymphadenopathy. CHEST: Normal breath sounds bilaterally. No wheezing, rales, rhonchi or crackles. No use of accessory muscles of respiration.  No reproducible chest wall tenderness.  CARDIOVASCULAR: S1, S2 normal. No murmurs, rubs, or gallops appreciated. Cap refill <2 seconds. ABDOMEN: Soft, nontender, nondistended. No rebound, guarding, rigidity. Normoactive bowel sounds present in all four quadrants. No organomegaly or mass. EXTREMITIES:bilateral lower legs are wrapped. NEUROLOGIC: Cranial nerves II through XII are grossly intact with no focal sensorimotor deficit. Global weakness however muscle strength is equal. Sensation intact. Gait not checked. PSYCHIATRIC: The patient is alert and oriented x 3. Normal affect, mood,  thought content. SKIN: Warm, dry, and intact without obvious rash, lesion, or ulcer.  LABORATORY PANEL:  CBC  Recent Labs Lab 07/27/16 1654  WBC 3.7  HGB 12.7  HCT 38.9  PLT 153   ----------------------------------------------------------------------------------------------------------------- Chemistries  Recent Labs Lab 07/27/16 1654  NA 139  K 3.7  CL 107  CO2 23  GLUCOSE 52*  BUN 11  CREATININE 0.79  CALCIUM 8.2*  AST 107*  ALT 41  ALKPHOS 225*  BILITOT 1.6*   ------------------------------------------------------------------------------------------------------------------ Cardiac Enzymes No results for input(s): TROPONINI in the last 168 hours. ------------------------------------------------------------------------------------------------------------------  RADIOLOGY: No results found.  EKG is pending  IMPRESSION AND PLAN:  This is a 60 y.o. female with a history of  Lupus, CVA, interstitial lung disease and COPD, rheumatoid arthritis, hyperlipidemia, hypertension, lower extremity edema  now being admitted with: 1. Failure to thrive-admit to inpatient for social work and care management consultation, physical therapy evaluation, gentle IV fluid hydration. Patient states that she would like to live in a nursing home because she is lonely. Consider psychiatry consultation for evaluation of underlying depression. We'll check TSH 2. Urinary tract infections-IV Rocephin started in the emergency department. We'll continue and follow up urine cultures..  3. History of CVA-continue Plavix 4. History of hypertension-continue Lasix 5. History of hyperlipidemia-continue Lipitor 6. History of rheumatoid arthritis and lupus-continue Plaquenil Patient may continue nitroglycerin   Diet/Nutrition: Heart healthy  Fluids: IVNS DVT Px: Lovenox, SCDs and early ambulation Code Status: Full  All the records are reviewed and case discussed with ED provider. Management plans  discussed with the patient and/or family who express understanding and agree with plan of care.  TOTAL TIME TAKING CARE OF THIS PATIENT: 60 minutes.   Camelia Stelzner D.O. on 07/28/2016 at 1:32 AM Between 7am to 6pm - Pager - [phone] After 6pm go to [email or web address] - password [email or web address] [facility] [place] [facility] [place] [facility] Office [phone] CC: Primary care physician; Sherrie Mustache     Note: This dictation was prepared with Dragon dictation along with smaller phrase technology. Any transcriptional errors that result from this process are unintentional.

## 2016-07-28 NOTE — Progress Notes (Signed)
Initial Nutrition Assessment  DOCUMENTATION CODES:   Severe malnutrition in context of chronic illness  INTERVENTION:  1. Ensure Enlive po BID, each supplement provides 350 kcal and 20 grams of protein  NUTRITION DIAGNOSIS:   Malnutrition related to chronic illness as evidenced by moderate to severe fluid accumulation, percent weight loss.  GOAL:   Patient will meet greater than or equal to 90% of their needs  MONITOR:   PO intake, I & O's, Labs, Weight trends  REASON FOR ASSESSMENT:   Malnutrition Screening Tool    ASSESSMENT:   [name] is a 60 y.o. female with a known history ofLupus, CVA, interstitial lung disease and COPD, rheumatoid arthritis, hyperlipidemia, hypertension, lower extremity edema presents to the emergency department referred by social worker for evaluation of failure to thrive  Spoke with pt at bedside. She admits to poor appetite for the past 3 weeks Reports a normal weight of 170# and a 10# wt loss. Per chart she is 173# currently but she has massive edema in her lower extremities. Exhibits a severe 28#/14% severe wt loss over 2 months.  Reports some nausea. Denies vomiting or any issues chewing/swallowing/choking Reports normally eating 2 meals per day when eating well. Had Grapes and Pancakes this morning, states she did not eat much. Documented meal completion: 75%  Given edema and recent weight loss - patient meats criteria for severe malnutrition in context of chronic illness.  Nutrition-Focused physical exam completed. Findings are no fat depletion, no muscle depletion, and severe edema.   Labs and medications reviewed: K 2.9, Tot Bili 1.6  Diet Order:  Diet Heart Room service appropriate? Yes; Fluid consistency: Thin  Skin:  Reviewed, no issues  Last BM:  12/13  Height:   Ht Readings from Last 1 Encounters:  07/28/16 5\' 2"  (1.575 m)    Weight:   Wt Readings from Last 1 Encounters:  07/28/16 173 lb 9.6 oz (78.7 kg)     Ideal Body Weight:  50 kg  BMI:  Body mass index is 31.75 kg/m.  Estimated Nutritional Needs:   Kcal:  1600-1950 calories  Protein:  78-95 gm  Fluid:  >/= 1.6L  EDUCATION NEEDS:   No education needs identified at this time  07/30/16. Micheal Sheen, MS, RD LDN Inpatient Clinical Dietitian Pager [phone]

## 2016-07-28 NOTE — Progress Notes (Signed)
Pharmacy Antibiotic Note  [name] is a 60 y.o. female admitted on 07/27/2016 with UTI.  Pharmacy has been consulted for cetriaxone dosing.  Plan: Ceftriaxone 2 rams q 24 hours ordered  Height: 5\' 2"  (157.5 cm) Weight: 173 lb 9.6 oz (78.7 kg) IBW/kg (Calculated) : 50.1  Temp (24hrs), Avg:97.9 F (36.6 C), Min:97.3 F (36.3 C), Max:98.6 F (37 C)   Recent Labs Lab 07/27/16 1654  WBC 3.7  CREATININE 0.79    Estimated Creatinine Clearance: 71.7 mL/min (by C-G formula based on SCr of 0.79 mg/dL).    No Known Allergies  Antimicrobials this admission: ceftriaxone >> 12/12   >>   Dose adjustments this admission:   Microbiology results: 1/1 UCx: pending    Thank you for allowing pharmacy to be a part of this patient's care.  Sutter Ahlgren S 07/28/2016 12:30 AM

## 2016-07-28 NOTE — Clinical Social Work Note (Signed)
Clinical Social Work Assessment  Patient Details  Name: [name] MRN: [id number] Date of Birth: Jan 04, 1956  Date of referral:  07/28/16               Reason for consult:  Facility Placement                Permission sought to share information with:  Chartered certified accountant granted to share information::  Yes, Verbal Permission Granted  Name::      [employer]::   [place]   Relationship::     Contact Information:     Housing/Transportation Living arrangements for the past 2 months:  [place] of Information:  Patient, Outpatient Provider, Friend/Neighbor Patient Interpreter Needed:  None Criminal Activity/Legal Involvement Pertinent to Current Situation/Hospitalization:  No - Comment as needed Significant Relationships:  Adult Children, Friend, Other Family Members Lives with:  Roommate, Friends Do you feel safe going back to the place where you live?  Yes Need for family participation in patient care:  Yes (Comment)  Care giving concerns:  Patient lives with her friend/ roommate [name]    Social Worker assessment / plan:  Holiday representative (CSW) received SNF consult. PT is recommending SNF. CSW met with patient alone at bedside to discuss D/C plan. Patient was alert and oriented and laying in the bed. CSW introduced self and explained role of CSW department. Patient reported that her home health social worker was working on placing her in a group home. Patient reported that she is currently living with her friend [name] Patient reported that her friend cannot take care of her anymore. CSW explained that PT is recommending SNF. CSW explained that patient would require a 3 night qualifying inpatient stay at [facility] in order for Medicare to pay for SNF. Patient was admitted to inpatient on 07/27/16. Patient verbalized her understanding and is agreeable to SNF search in [place]. Patient reported  that she has lived in [place] for 6 months and moved from [place]. Patient requested for CSW to call her friend [name] to discuss SNF options.  FL2 complete and faxed out. CSW provided bed offers to patient. CSW also contacted patient's friend [name] and made her aware of above. [name] was happy to hear that patient was eligible for SNF. Per [name] they have been trying to get patient placed for a while. CSW provided bed offers to [place]. CSW will follow up with patient and Rena tomorrow to get choice.   CSW received a call from patient's home health [facility] social worker Lenna Sciara, [place] [phone]. [name] reported that patient's mother passed away around Thanksgiving and patient has been severely depressed. [name] reported that patient has not ate a whole meal since Thanksgiving and drinks half of an ensure a day. Per [name] patient's behaviors are extreme and she is in needs of psychiatric help. CSW made [name] aware that pysch has been consulted and patient will be placed at a SNF for rehab. CSW will continue to follow and assist as needed.    Employment status:  Disabled (Comment on whether or not currently receiving Disability) Insurance information:  Medicare, Medicaid In [place] PT Recommendations:  [place] / Referral to community resources:  [place]  Patient/Family's Response to care:  Patient and family are agreeable to SNF placement.   Patient/Family's Understanding of and Emotional Response to Diagnosis, Current Treatment, and Prognosis:  Patient and family were very pleasant and  thanked CSW for assistance.   Emotional Assessment Appearance:  Appears stated age Attitude/Demeanor/Rapport:    Affect (typically observed):  Flat, Pleasant Orientation:  Oriented to Self, Oriented to Place, Oriented to  Time, Oriented to Situation Alcohol / Substance use:  Not Applicable Psych involvement (Current and /or in the community):  Yes (Comment) (Psych consult  for depression)  Discharge Needs  Concerns to be addressed:  Discharge Planning Concerns Readmission within the last 30 days:  No Current discharge risk:  Chronically ill, Psychiatric Illness Barriers to Discharge:  Continued Medical Work up   [employer], Veronia Beets, LCSW 07/28/2016, 6:54 PM

## 2016-07-28 NOTE — Evaluation (Signed)
Physical Therapy Evaluation Patient Details Name: [name] MRN: [id number] DOB: 04/14/1956 Today's Date: 07/28/2016   History of Present Illness  60 yo female with onset of UTI after recent admit for RLE wounds with current unna boot and coban tx.  PMHx:  wound infection, LE wounds, interstital lung disease, lupus, and RA, lung CA, CVA, COPD  Clinical Impression  Pt is up to sidestep but cannot ambulate forward.  Sats were 97% pre and post standing but pulse elevated to 108 from 62 at baseline.  Will continue to follow acutely to see how she progresses and monitor vitals as needed for her comfort and safety.      Follow Up Recommendations SNF    Equipment Recommendations  None recommended by PT    Recommendations for Other Services       Precautions / Restrictions Precautions Precautions: Fall (RLE wounds, on O2) Precaution Comments: ck sats and pulses Required Braces or Orthoses:  (Pt has a soft wrap she likes to be on LLE) Restrictions Weight Bearing Restrictions: No      Mobility  Bed Mobility Overal bed mobility: Needs Assistance Bed Mobility: Supine to Sit;Sit to Supine     Supine to sit: Mod assist Sit to supine: Mod assist;Max assist   General bed mobility comments: legs are difficult for her to move and her main issue  Transfers Overall transfer level: Needs assistance Equipment used: Rolling walker (2 wheeled);1 person hand held assist Transfers: Sit to/from Stand Sit to Stand: Mod assist         General transfer comment: buckling appearance of knees and slides feet sideways to move, cannot forward propulse  Ambulation/Gait             General Gait Details: unable to functionally walk  Stairs            Wheelchair Mobility    Modified Rankin (Stroke Patients Only)       Balance Overall balance assessment: Needs assistance Sitting-balance support: Feet supported Sitting balance-Leahy Scale: Fair     Standing balance  support: Bilateral upper extremity supported Standing balance-Leahy Scale: Poor                               Pertinent Vitals/Pain Pain Assessment: 0-10 Pain Score: 8  Pain Location: R leg wounds Pain Intervention(s): Limited activity within patient's tolerance;Monitored during session;Repositioned;Other (comment) (notified nursing)    Home Living Family/patient expects to be discharged to:: Skilled nursing facility Living Arrangements: Non-relatives/Friends                    Prior Function Level of Independence: Independent with assistive device(s)         Comments: used RW for gait in the house     Hand Dominance        Extremity/Trunk Assessment   Upper Extremity Assessment: Generalized weakness           Lower Extremity Assessment: Generalized weakness      Cervical / Trunk Assessment: Kyphotic  Communication   Communication: Other (comment);Expressive difficulties (has soft mumbled speech)  Cognition Arousal/Alertness: Awake/alert Behavior During Therapy: WFL for tasks assessed/performed Overall Cognitive Status: Within Functional Limits for tasks assessed                      General Comments General comments (skin integrity, edema, etc.): Pt is weak, on O2 at baseline and unable to safely move  without signficant help.    Exercises     Assessment/Plan    PT Assessment Patient needs continued PT services  PT Problem List Decreased strength;Decreased range of motion;Decreased activity tolerance;Decreased balance;Decreased mobility;Decreased coordination;Decreased knowledge of use of DME;Decreased safety awareness;Cardiopulmonary status limiting activity          PT Treatment Interventions DME instruction;Gait training;Functional mobility training;Therapeutic activities;Therapeutic exercise;Balance training;Neuromuscular re-education;Patient/family education    PT Goals (Current goals can be found in the Care Plan  section)  Acute Rehab PT Goals Patient Stated Goal: to walk without help PT Goal Formulation: With patient Time For Goal Achievement: 08/11/16 Potential to Achieve Goals: Good    Frequency Min 2X/week   Barriers to discharge Decreased caregiver support (needs 2 person help to wlk)      Co-evaluation               End of Session Equipment Utilized During Treatment: Gait belt;Oxygen Activity Tolerance: Patient tolerated treatment well;Patient limited by fatigue;Other (comment) (wheezing) Patient left: in bed;with call bell/phone within reach;with bed alarm set Nurse Communication: Mobility status         Time: 2297-9892 PT Time Calculation (min) (ACUTE ONLY): 25 min   Charges:   PT Evaluation $PT Eval Moderate Complexity: 1 Procedure PT Treatments $Therapeutic Activity: 8-22 mins   PT G Codes:        Ivar Drape 2016/08/19, 10:05 AM   Samul Dada, PT MS Acute Rehab Dept. Number: [facility] [id number] and [facility] [phone]

## 2016-07-29 ENCOUNTER — Inpatient Hospital Stay: Payer: Medicare Other

## 2016-07-29 DIAGNOSIS — F322 Major depressive disorder, single episode, severe without psychotic features: Secondary | ICD-10-CM

## 2016-07-29 DIAGNOSIS — F4321 Adjustment disorder with depressed mood: Secondary | ICD-10-CM

## 2016-07-29 LAB — URINE CULTURE

## 2016-07-29 LAB — CBC
HCT: 33.4 % — ABNORMAL LOW (ref 35.0–47.0)
Hemoglobin: 11 g/dL — ABNORMAL LOW (ref 12.0–16.0)
MCH: 32 pg (ref 26.0–34.0)
MCHC: 33 g/dL (ref 32.0–36.0)
MCV: 96.8 fL (ref 80.0–100.0)
PLATELETS: 116 10*3/uL — AB (ref 150–440)
RBC: 3.45 MIL/uL — ABNORMAL LOW (ref 3.80–5.20)
RDW: 14.3 % (ref 11.5–14.5)
WBC: 3.9 10*3/uL (ref 3.6–11.0)

## 2016-07-29 LAB — BASIC METABOLIC PANEL
Anion gap: 5 (ref 5–15)
BUN: 8 mg/dL (ref 6–20)
CALCIUM: 7.6 mg/dL — AB (ref 8.9–10.3)
CHLORIDE: 111 mmol/L (ref 101–111)
CO2: 24 mmol/L (ref 22–32)
CREATININE: 0.73 mg/dL (ref 0.44–1.00)
GFR calc non Af Amer: >60 mL/min (ref >60)
Glucose, Bld: 86 mg/dL (ref 65–99)
Potassium: 3.4 mmol/L — ABNORMAL LOW (ref 3.5–5.1)
SODIUM: 140 mmol/L (ref 135–145)

## 2016-07-29 MED ORDER — POTASSIUM CHLORIDE CRYS ER 20 MEQ PO TBCR
40.0000 meq | EXTENDED_RELEASE_TABLET | Freq: Once | ORAL | Status: AC
  Administered 2016-07-29: 40 meq via ORAL
  Filled 2016-07-29: qty 2

## 2016-07-29 MED ORDER — CEPHALEXIN 500 MG PO CAPS
500.0000 mg | ORAL_CAPSULE | Freq: Two times a day (BID) | ORAL | Status: DC
  Administered 2016-07-29 – 2016-07-30 (×2): 500 mg via ORAL
  Filled 2016-07-29 (×2): qty 1

## 2016-07-29 MED ORDER — HYDROCODONE-ACETAMINOPHEN 5-325 MG PO TABS
1.0000 | ORAL_TABLET | Freq: Four times a day (QID) | ORAL | Status: DC | PRN
  Administered 2016-07-29: 1 via ORAL
  Filled 2016-07-29: qty 1

## 2016-07-29 MED ORDER — MIRTAZAPINE 15 MG PO TABS
7.5000 mg | ORAL_TABLET | Freq: Every day | ORAL | Status: DC
  Administered 2016-07-30: 7.5 mg via ORAL
  Filled 2016-07-29: qty 1

## 2016-07-29 NOTE — Progress Notes (Signed)
Clinical Social Worker (CSW) met with patient and presented bed offers. Patient chose [facility]. Adult Protective Services worker Christina Doss was at bedside meeting with patient. Patient has an open APS case. CSW contacted patient's friend Rena and made her aware of patient accepting [facility]. Rena is in agreement with plan. Leslie admissions coordinator at [facility] is aware of accepted bed offer. CSW will continue to follow and assist as needed.   Bailey Sample, LCSW [phone] 

## 2016-07-29 NOTE — Progress Notes (Signed)
Physical Therapy Treatment Patient Details Name: [name] MRN: [id number] DOB: 07/17/1956 Today's Date: 07/29/2016    History of Present Illness 60 yo female with onset of UTI after recent admit for RLE wounds with current unna boot and coban tx.  PMHx:  wound infection, LE wounds, interstital lung disease, lupus, and RA, lung CA, CVA, COPD    PT Comments    Pt initially resistant to therapy but eventually agrees to participate. She demonstrates improved strength today compared to initial evaluation but still very limited. Pt able to perform some limited ambulation at bedside but is easily fatigued requiring rest break between bouts. She still requires considerable assist for bed mobility and reports bilateral LE pain. Pt will benefit from skilled PT services to address deficits in strength, balance, and mobility in order to return to full function at home.   Follow Up Recommendations  SNF     Equipment Recommendations  None recommended by PT    Recommendations for Other Services       Precautions / Restrictions Precautions Precautions: Fall Precaution Comments: ck sats and pulses Restrictions Weight Bearing Restrictions: No    Mobility  Bed Mobility Overal bed mobility: Needs Assistance Bed Mobility: Supine to Sit;Sit to Supine     Supine to sit: Mod assist Sit to supine: Mod assist   General bed mobility comments: Pt moves slowly and is highly dependent on bed rails and therapist to assist. Assist to move bilateral LE to edge of bed  Transfers Overall transfer level: Needs assistance Equipment used: Rolling walker (2 wheeled) Transfers: Sit to/from Stand Sit to Stand: Mod assist         General transfer comment: Initially pt requires modA but progresses to minA+1 with repeated transfers. Poor anterior weight shifting with heavy posterior lean in standing. Pt supporting bilateral LEs on bed rails in standing to prevent posterior  LOB  Ambulation/Gait Ambulation/Gait assistance: Min assist Ambulation Distance (Feet): 32 Feet (16+16) Assistive device: Rolling walker (2 wheeled) Gait Pattern/deviations: Decreased step length - right;Decreased step length - left Gait velocity: Decreased Gait velocity interpretation: <1.8 ft/sec, indicative of risk for recurrent falls General Gait Details: Pt able to to forward/backward steps at EOB. She performs 2 bouts of 16' each with seated rest break between due to fatigue. Pt reports DOE. VSS monitored throughout and SaO2 remains at or above 91% on 2 L/min O2 throughout   Stairs            Wheelchair Mobility    Modified Rankin (Stroke Patients Only)       Balance Overall balance assessment: Needs assistance Sitting-balance support: Feet supported Sitting balance-Leahy Scale: Fair     Standing balance support: Bilateral upper extremity supported Standing balance-Leahy Scale: Poor                      Cognition Arousal/Alertness: Awake/alert Behavior During Therapy: WFL for tasks assessed/performed Overall Cognitive Status: Within Functional Limits for tasks assessed                      Exercises General Exercises - Lower Extremity Long Arc Quad: Strengthening;Both;10 reps;Seated Heel Slides: Strengthening;Both;10 reps;Seated Hip ABduction/ADduction: Strengthening;Both;10 reps;Seated Hip Flexion/Marching: Strengthening;Both;10 reps;Seated;Other (comment) (Also performed x 10 in standing)    General Comments        Pertinent Vitals/Pain Pain Assessment: 0-10 Pain Score: 8  Pain Location: Bilateral LEs Pain Intervention(s): Monitored during session    Home Living  Prior Function            PT Goals (current goals can now be found in the care plan section) Acute Rehab PT Goals Patient Stated Goal: to walk without help PT Goal Formulation: With patient Time For Goal Achievement: 08/11/16 Potential  to Achieve Goals: Good Progress towards PT goals: Progressing toward goals    Frequency    Min 2X/week      PT Plan Current plan remains appropriate    Co-evaluation             End of Session Equipment Utilized During Treatment: Gait belt;Oxygen Activity Tolerance: Patient limited by fatigue Patient left: in bed;with call bell/phone within reach;with bed alarm set     Time: 1308-6578 PT Time Calculation (min) (ACUTE ONLY): 16 min  Charges:  $Therapeutic Exercise: 8-22 mins                    G Codes:      Sharalyn Ink Temeca Somma PT, DPT   Blenda Wisecup 07/29/2016, 10:43 AM

## 2016-07-29 NOTE — Consult Note (Signed)
[facility] Consult   Reason for Consult:  Consult for 60 year old woman with history of lupus currently in the hospital with failure to thrive Referring Physician:  Gouru Patient Identification: [name] MRN:  [id number] Principal Diagnosis: Severe major depression, single episode, without psychotic features [facility]) Diagnosis:   Patient Active Problem List   Diagnosis Date Noted  . Severe major depression, single episode, without psychotic features ([place]) [F32.2] 07/29/2016  . Grief [F43.20] 07/29/2016  . UTI (urinary tract infection) [N39.0] 07/27/2016  . Wound infection [T14.8XXA, L08.9] 06/02/2016  . Chest pain [R07.9] 01/17/2016  . Lupus [L93.0] 01/17/2016  . H/O: stroke [Z86.73] 01/17/2016    Total Time spent with patient: 1 hour  Subjective:   [name] is a 60 y.o. female patient admitted with "I feel so bad and I need someone to talk to".  HPI:  Patient interviewed. Chart reviewed. This is a 60 year old woman with a history of lupus who was brought to the hospital with failure to thrive not eating progressive weakness. Patient tells me that her mother died just a few weeks ago. Ever since then she has been feeling constantly depressed and sad. She is not eating very well. Her energy level is low. She has stopped getting up and taking care of herself. She is sleeping poorly at night. Patient denies any hallucinations. Denies substance abuse. Denies any suicidal thoughts or wish to die. Not currently receiving psychiatric treatment. Patient is able to articulate positive things in her life to live for including her son and grandchildren and family although she is also complaining that her daughter is mean to her.  Social history: Patient lives with another woman. Sounds like it's been a long-standing situation the exact nature of which isn't clear but the patient seems satisfied with it. She has adult children. Says she has a good relationship with her  son but not her daughter. Mother died recently.  Medical history: Lupus. History of urinary tract infection past history of stroke  Substance abuse history: Denies any history of alcohol or drug abuse  Past Psychiatric History: Patient denies ever seeing a psychiatrist or therapist in the past. Denies being on any medication for mood or anxiety. No history of suicide attempts. No history of violence. Patient does not have oh any awareness of having major cognitive problems.  Risk to Self: Is patient at risk for suicide?: No Risk to Others:   Prior Inpatient Therapy:   Prior Outpatient Therapy:    Past Medical History:  Past Medical History:  Diagnosis Date  . COPD (chronic obstructive pulmonary disease) ([place])   . ILD (interstitial lung disease) ([place])   . Lupus   . Stroke [facility])     Past Surgical History:  Procedure Laterality Date  . ABDOMINAL HYSTERECTOMY    . APPENDECTOMY    . TONSILLECTOMY     Family History:  Family History  Problem Relation Age of Onset  . Hypertension Other    Family Psychiatric  History: She denies being aware of any family history of mental illness Social History:  History  Alcohol Use No     History  Drug Use No    Social History   Social History  . Marital status: Divorced    Spouse name: N/A  . Number of children: N/A  . Years of education: N/A   Social History Main Topics  . Smoking status: Former Research scientist (life sciences)  . Smokeless tobacco: Former Systems developer  . Alcohol use No  . Drug use:  No  . Sexual activity: Not Asked   Other Topics Concern  . None   Social History Narrative  . None   Additional Social History:    Allergies:  No Known Allergies  Labs:  Results for orders placed or performed during the hospital encounter of 07/27/16 (from the past 48 hour(s))  Basic metabolic panel     Status: Abnormal   Collection Time: 07/28/16  3:49 AM  Result Value Ref Range   Sodium 136 135 - 145 mmol/L   Potassium 2.9 (L) 3.5 - 5.1 mmol/L    Chloride 107 101 - 111 mmol/L   CO2 23 22 - 32 mmol/L   Glucose, Bld 82 65 - 99 mg/dL   BUN 11 6 - 20 mg/dL   Creatinine, Ser 0.68 0.44 - 1.00 mg/dL   Calcium 7.7 (L) 8.9 - 10.3 mg/dL   GFR calc non Af Amer >60 >60 mL/min   GFR calc Af Amer >60 >60 mL/min    Comment: (NOTE) The eGFR has been calculated using the CKD EPI equation. This calculation has not been validated in all clinical situations. eGFR's persistently <60 mL/min signify possible Chronic Kidney Disease.    Anion gap 6 5 - 15  CBC     Status: Abnormal   Collection Time: 07/28/16  3:49 AM  Result Value Ref Range   WBC 3.2 (L) 3.6 - 11.0 K/uL   RBC 3.54 (L) 3.80 - 5.20 MIL/uL   Hemoglobin 11.1 (L) 12.0 - 16.0 g/dL   HCT 33.5 (L) 35.0 - 47.0 %   MCV 94.6 80.0 - 100.0 fL   MCH 31.5 26.0 - 34.0 pg   MCHC 33.3 32.0 - 36.0 g/dL   RDW 14.7 (H) 11.5 - 14.5 %   Platelets 120 (L) 150 - 440 K/uL  TSH     Status: Abnormal   Collection Time: 07/28/16  3:49 AM  Result Value Ref Range   TSH 8.242 (H) 0.350 - 4.500 uIU/mL    Comment: Performed by a 3rd Generation assay with a functional sensitivity of <=0.01 uIU/mL.  CBC     Status: Abnormal   Collection Time: 07/29/16  3:37 AM  Result Value Ref Range   WBC 3.9 3.6 - 11.0 K/uL   RBC 3.45 (L) 3.80 - 5.20 MIL/uL   Hemoglobin 11.0 (L) 12.0 - 16.0 g/dL   HCT 33.4 (L) 35.0 - 47.0 %   MCV 96.8 80.0 - 100.0 fL   MCH 32.0 26.0 - 34.0 pg   MCHC 33.0 32.0 - 36.0 g/dL   RDW 14.3 11.5 - 14.5 %   Platelets 116 (L) 150 - 440 K/uL  Basic metabolic panel     Status: Abnormal   Collection Time: 07/29/16  3:37 AM  Result Value Ref Range   Sodium 140 135 - 145 mmol/L   Potassium 3.4 (L) 3.5 - 5.1 mmol/L   Chloride 111 101 - 111 mmol/L   CO2 24 22 - 32 mmol/L   Glucose, Bld 86 65 - 99 mg/dL   BUN 8 6 - 20 mg/dL   Creatinine, Ser 0.73 0.44 - 1.00 mg/dL   Calcium 7.6 (L) 8.9 - 10.3 mg/dL   GFR calc non Af Amer >60 >60 mL/min   GFR calc Af Amer >60 >60 mL/min    Comment: (NOTE) The  eGFR has been calculated using the CKD EPI equation. This calculation has not been validated in all clinical situations. eGFR's persistently <60 mL/min signify possible Chronic Kidney Disease.  Anion gap 5 5 - 15    Current Facility-Administered Medications  Medication Dose Route Frequency Provider Last Rate Last Dose  . acetaminophen (TYLENOL) tablet 650 mg  650 mg Oral Q6H PRN Alexis Hugelmeyer, DO       Or  . acetaminophen (TYLENOL) suppository 650 mg  650 mg Rectal Q6H PRN Alexis Hugelmeyer, DO      . albuterol (PROVENTIL) (2.5 MG/3ML) 0.083% nebulizer solution 2.5 mg  2.5 mg Nebulization Q6H PRN Alexis Hugelmeyer, DO      . atorvastatin (LIPITOR) tablet 40 mg  40 mg Oral q1800 Alexis Hugelmeyer, DO   40 mg at 07/29/16 1820  . bisacodyl (DULCOLAX) EC tablet 5 mg  5 mg Oral Daily PRN Alexis Hugelmeyer, DO      . cephALEXin (KEFLEX) capsule 500 mg  500 mg Oral Q12H Aruna Gouru, MD   500 mg at 07/29/16 1820  . clopidogrel (PLAVIX) tablet 75 mg  75 mg Oral Daily Alexis Hugelmeyer, DO   75 mg at 07/29/16 0917  . docusate sodium (COLACE) capsule 100 mg  100 mg Oral BID PRN Alexis Hugelmeyer, DO      . enoxaparin (LOVENOX) injection 40 mg  40 mg Subcutaneous Q24H Alexis Hugelmeyer, DO   40 mg at 07/28/16 2118  . feeding supplement (ENSURE ENLIVE) (ENSURE ENLIVE) liquid 237 mL  237 mL Oral BID BM Snehalatha Konidena, MD   237 mL at 07/29/16 1827  . HYDROcodone-acetaminophen (NORCO/VICODIN) 5-325 MG per tablet 1 tablet  1 tablet Oral Q6H PRN Aruna Gouru, MD      . hydroxychloroquine (PLAQUENIL) tablet 200 mg  200 mg Oral BID Alexis Hugelmeyer, DO   200 mg at 07/29/16 0917  . levothyroxine (SYNTHROID, LEVOTHROID) tablet 50 mcg  50 mcg Oral QAC breakfast Snehalatha Konidena, MD   50 mcg at 07/29/16 0917  . magnesium citrate solution 1 Bottle  1 Bottle Oral Once PRN Alexis Hugelmeyer, DO      . mirtazapine (REMERON) tablet 7.5 mg  7.5 mg Oral QHS John T Clapacs, MD      . nitroGLYCERIN  (NITROSTAT) SL tablet 0.4 mg  0.4 mg Sublingual Q5 min PRN Alexis Hugelmeyer, DO      . ondansetron (ZOFRAN) tablet 4 mg  4 mg Oral Q6H PRN Alexis Hugelmeyer, DO       Or  . ondansetron (ZOFRAN) injection 4 mg  4 mg Intravenous Q6H PRN Alexis Hugelmeyer, DO      . senna-docusate (Senokot-S) tablet 1 tablet  1 tablet Oral QHS PRN Alexis Hugelmeyer, DO        Musculoskeletal: Strength & Muscle Tone: decreased Gait & Station: unable to stand Patient leans: N/A  Psychiatric Specialty Exam: Physical Exam  Nursing note and vitals reviewed. Constitutional: She appears well-developed. She appears distressed.  HENT:  Head: Normocephalic and atraumatic.  Eyes: Conjunctivae are normal. Pupils are equal, round, and reactive to light.  Neck: Normal range of motion.  Cardiovascular: Regular rhythm and normal heart sounds.   Respiratory: Effort normal. No respiratory distress.  GI: Soft.  Musculoskeletal: Normal range of motion.  Neurological: She is alert.  Skin: Skin is warm and dry.  Psychiatric: Judgment normal. Her mood appears anxious. Her speech is delayed. She is slowed. She exhibits a depressed mood. She expresses no suicidal ideation. She exhibits abnormal recent memory and abnormal remote memory.    Review of Systems  Constitutional: Positive for malaise/fatigue and weight loss.  HENT: Negative.   Eyes: Negative.   Respiratory: Negative.     Cardiovascular: Negative.   Gastrointestinal: Negative.   Musculoskeletal: Negative.   Skin: Negative.   Neurological: Positive for weakness.  Psychiatric/Behavioral: Positive for depression. Negative for hallucinations, memory loss, substance abuse and suicidal ideas. The patient is nervous/anxious and has insomnia.     Blood pressure 119/72, pulse 92, temperature 99.7 F (37.6 C), temperature source Oral, resp. rate 17, height 5' 2" (1.575 m), weight 78.7 kg (173 lb 9.6 oz), SpO2 100 %.Body mass index is 31.75 kg/m.  General Appearance:  Casual  Eye Contact:  Fair  Speech:  Slow  Volume:  Decreased  Mood:  Depressed  Affect:  Congruent  Thought Process:  Goal Directed  Orientation:  Full (Time, Place, and Person)  Thought Content:  Rumination  Suicidal Thoughts:  No  Homicidal Thoughts:  No  Memory:  Immediate;   Fair Recent;   Poor Remote;   Fair  Judgement:  Fair  Insight:  Fair  Psychomotor Activity:  Decreased  Concentration:  Concentration: Fair  Recall:  Fair  Fund of Knowledge:  Fair  Language:  Fair  Akathisia:  No  Handed:  Right  AIMS (if indicated):     Assets:  Desire for Improvement Housing  ADL's:  Impaired  Cognition:  Impaired,  Mild  Sleep:        Treatment Plan Summary: Daily contact with patient to assess and evaluate symptoms and progress in treatment, Medication management and Plan 60-year-old woman who has complicated grief that seems to be verging into a major depression. Particularly worrisome about not eating well not sleeping well not taking care of herself. On the other hand she is not voicing any suicidal ideation and is able to articulate positive things in life. Does not appear to be psychotic. Does seem to have some cognitive impairment although the full degree of that and chronicity is unclear. Not clear to me at this point that she would need inpatient psychiatric treatment but I would recommend that we begin treating her depression. Orders completed to start mirtazapine 7.5 mg at night. Patient agreeable to plan. I will continue to follow-up as needed.  Disposition: Supportive therapy provided about ongoing stressors. Discussed crisis plan, support from social network, calling 911, coming to the Emergency Department, and calling Suicide Hotline.  John Clapacs, MD 07/29/2016 8:30 PM 

## 2016-07-29 NOTE — Progress Notes (Signed)
Pt alert during the night. Medicated for pain with minimal relief. Blood pressure running a little low, but pt is stable.  Complains of pain in legs. No other complaints, staff will continue to monitor

## 2016-07-29 NOTE — Progress Notes (Signed)
[facility] [place] [facility]   PATIENT NAME: [name]    MR#:  [id number]  DATE OF BIRTH:  December 08, 1955  SUBJECTIVE:  CHIEF COMPLAINT:  Patient is feeling much better. Tolerating diet. Answering questions appropriately denies any dysuria or burning micturition  REVIEW OF SYSTEMS:  CONSTITUTIONAL: No fever, fatigue or weakness.  EYES: No blurred or double vision.  EARS, NOSE, AND THROAT: No tinnitus or ear pain.  RESPIRATORY: No cough, shortness of breath, wheezing or hemoptysis.  CARDIOVASCULAR: No chest pain, orthopnea, edema.  GASTROINTESTINAL: No nausea, vomiting, diarrhea or abdominal pain.  GENITOURINARY: No dysuria, hematuria.  ENDOCRINE: No polyuria, nocturia,  HEMATOLOGY: No anemia, easy bruising or bleeding SKIN: No rash or lesion. MUSCULOSKELETAL: No joint pain or arthritis.   NEUROLOGIC: No tingling, numbness, weakness.  PSYCHIATRY: Patient is  depressed.   DRUG ALLERGIES:  No Known Allergies  VITALS:  Blood pressure 114/68, pulse 91, temperature 98.9 F (37.2 C), temperature source Oral, resp. rate 14, height 5\' 2"  (1.575 m), weight 78.7 kg (173 lb 9.6 oz), SpO2 97 %.  PHYSICAL EXAMINATION:  GENERAL:  60 y.o.-year-old patient lying in the bed with no acute distress.  EYES: Pupils equal, round, reactive to light and accommodation. No scleral icterus. Extraocular muscles intact.  HEENT: Head atraumatic, normocephalic. Oropharynx and nasopharynx clear.  NECK:  Supple, no jugular venous distention. No thyroid enlargement, no tenderness.  LUNGS: Normal breath sounds bilaterally, no wheezing, rales,rhonchi or crepitation. No use of accessory muscles of respiration.  CARDIOVASCULAR: S1, S2 normal. No murmurs, rubs, or gallops.  ABDOMEN: Soft, nontender, nondistended. Bowel sounds present. No organomegaly or mass.  EXTREMITIES: No pedal edema, cyanosis, or clubbing.  NEUROLOGIC: Cranial nerves II through XII are intact. Muscle strength  5/5 in all extremities. Sensation intact. Gait not checked.  PSYCHIATRIC: The patient is alert and oriented x 3. Flat affect and mood SKIN: No obvious rash, lesion, or ulcer.    LABORATORY PANEL:   CBC  Recent Labs Lab 07/29/16 0337  WBC 3.9  HGB 11.0*  HCT 33.4*  PLT 116*   ------------------------------------------------------------------------------------------------------------------  Chemistries   Recent Labs Lab 07/27/16 1654  07/29/16 0337  NA 139  < > 140  K 3.7  < > 3.4*  CL 107  < > 111  CO2 23  < > 24  GLUCOSE 52*  < > 86  BUN 11  < > 8  CREATININE 0.79  < > 0.73  CALCIUM 8.2*  < > 7.6*  AST 107*  --   --   ALT 41  --   --   ALKPHOS 225*  --   --   BILITOT 1.6*  --   --   < > = values in this interval not displayed. ------------------------------------------------------------------------------------------------------------------  Cardiac Enzymes No results for input(s): TROPONINI in the last 168 hours. ------------------------------------------------------------------------------------------------------------------  RADIOLOGY:  No results found.  EKG:   Orders placed or performed during the hospital encounter of 07/27/16  . EKG 12-Lead  . EKG 12-Lead    ASSESSMENT AND PLAN:   This is a 60 y.o. female with a history of  Lupus, CVA, interstitial lung disease and COPD, rheumatoid arthritis, hyperlipidemia, hypertension, lower extremity edema   #. Failure to thrive-secondary to depression  gentle IV fluid hydration. Psychiatry consult is pending Physical therapy is recommending skilled nursing facility   #TSH is elevated will check free T4 and T3 and get thyroid ultrasound  #. Urinary tract infections- Urine culture with multiple species  contaminated Patient denies dysuria or burning micturition and will change IV Rocephin to Keflex .Marland Kitchen  #. History of CVA-continue Plavix  #. History of hypertension-continue Lasix  # History of  hyperlipidemia-continue Lipitor  #. History of rheumatoid arthritis and lupus-continue Plaquenil   PT is recommending skilled nursing facility All the records are reviewed and case discussed with Care Management/Social Workerr. Management plans discussed with the patient, family and they are in agreement.  CODE STATUS: FC . Patient wants her sister to be healthcare power of attorney could not reach her  TOTAL TIME TAKING CARE OF THIS PATIENT: 38 minutes.   POSSIBLE D/C IN 1-2 DAYS, DEPENDING ON CLINICAL CONDITION.  Note: This dictation was prepared with Dragon dictation along with smaller phrase technology. Any transcriptional errors that result from this process are unintentional.   Ramonita Lab M.D on 07/29/2016 at 3:52 PM  Between 7am to 6pm - Pager - [phone] After 6pm go to [email or web address] - password [email or web address]  [facility] [place] [facility]  Office  [phone]  CC: Primary care physician; Sherrie Mustache

## 2016-07-30 LAB — T4, FREE: FREE T4: 1.4 ng/dL — AB (ref 0.61–1.12)

## 2016-07-30 MED ORDER — ENSURE ENLIVE PO LIQD: 237.0000 mL | Freq: Two times a day (BID) | ORAL | 12 refills | Status: AC

## 2016-07-30 MED ORDER — MIRTAZAPINE 7.5 MG PO TABS: 7.5000 mg | ORAL_TABLET | Freq: Every day | ORAL | 0 refills | Status: AC

## 2016-07-30 MED ORDER — HYDROCODONE-ACETAMINOPHEN 5-325 MG PO TABS
1.0000 | ORAL_TABLET | ORAL | Status: DC | PRN
  Administered 2016-07-30: 1 via ORAL
  Filled 2016-07-30: qty 1

## 2016-07-30 MED ORDER — ACETAMINOPHEN 325 MG PO TABS: 650.0000 mg | ORAL_TABLET | Freq: Four times a day (QID) | ORAL | Status: AC | PRN

## 2016-07-30 MED ORDER — LEVOTHYROXINE SODIUM 50 MCG PO TABS: 75.0000 ug | ORAL_TABLET | Freq: Every day | ORAL | 0 refills | Status: AC

## 2016-07-30 MED ORDER — CEPHALEXIN 500 MG PO CAPS: 500.0000 mg | ORAL_CAPSULE | Freq: Two times a day (BID) | ORAL | 0 refills | Status: AC

## 2016-07-30 MED ORDER — HYDROCODONE-ACETAMINOPHEN 5-325 MG PO TABS: 1.0000 | ORAL_TABLET | Freq: Four times a day (QID) | ORAL | 0 refills | Status: AC | PRN

## 2016-07-30 NOTE — Progress Notes (Addendum)
Report called to [place] at [facility]. Pt going to room #407. Iv removed, belongings packed. Nurse tech to prepare pt for transport. EMS called.

## 2016-07-30 NOTE — Discharge Summary (Signed)
[facility] Physicians -  at [facility]   PATIENT NAME: [name]    MR#:  [id number]  DATE OF BIRTH:  June 15, 1956  DATE OF ADMISSION:  07/27/2016 ADMITTING PHYSICIAN: Tonye Royalty, DO  DATE OF DISCHARGE: 07/30/16  PRIMARY CARE PHYSICIAN: Sherrie Mustache    ADMISSION DIAGNOSIS:  Urinary tract infection without hematuria, site unspecified [N39.0] Depression, unspecified depression type [F32.9]  DISCHARGE DIAGNOSIS:   UTI Depression Grief reaction Elevated TSH with history of hypothyroidism SECONDARY DIAGNOSIS:   Past Medical History:  Diagnosis Date  . COPD (chronic obstructive pulmonary disease) (HCC)   . ILD (interstitial lung disease) (HCC)   . Lupus   . Stroke [facility])     HOSPITAL COURSE:   This is a 60 y.o.femalewith a history of Lupus, CVA, interstitial lung disease and COPD, rheumatoid arthritis, hyperlipidemia, hypertension, lower extremity edema  #. Failure to thrive-secondary to depression  gentle IV fluid hydration provided Psychiatry consulted. Doesn't think he needs inpatient psychiatry admission. Started patient on mirtazapine 7.5 mg by mouth daily at bedtime. Patient needs outpatient psych follow-up Physical therapy is recommending skilled nursing facility. Patient is agreeable   #TSH is elevated Normal thyroid ultrasound Free T4 is elevated at 1.40 and T3 is pending. PCP at the facility has to follow up on that Increasing Synthroid dose from 50 g to   #. Urinary tract infections- Urine culture with multiple species contaminated Patient denies dysuria or burning micturition and changed IV Rocephin to Keflex Discharge patient to skilled nursing facility with by mouth Keflex .Marland Kitchen  #. History of CVA-continue Plavix  #. History of hypertension-continue Lasix  # History of hyperlipidemia-continue Lipitor  #. History of rheumatoid arthritis and lupus-continue Plaquenil   PT is recommending skilled nursing  facility. Patient is agreeable  DISCHARGE CONDITIONS:   STABLE  CONSULTS OBTAINED:  Treatment Team:  Audery Amel, MD   PROCEDURES NONE  DRUG ALLERGIES:  No Known Allergies  DISCHARGE MEDICATIONS:   Current Discharge Medication List    START taking these medications   Details  acetaminophen (TYLENOL) 325 MG tablet Take 2 tablets (650 mg total) by mouth every 6 (six) hours as needed for mild pain (or Fever >/= 101).    cephALEXin (KEFLEX) 500 MG capsule Take 1 capsule (500 mg total) by mouth 2 (two) times daily. Qty: 6 capsule, Refills: 0    feeding supplement, ENSURE ENLIVE, (ENSURE ENLIVE) LIQD Take 237 mLs by mouth 2 (two) times daily between meals. Qty: 237 mL, Refills: 12    levothyroxine (SYNTHROID, LEVOTHROID) 50 MCG tablet Take 1.5 tablets (75 mcg total) by mouth daily before breakfast. Qty: 60 tablet, Refills: 0    mirtazapine (REMERON) 7.5 MG tablet Take 1 tablet (7.5 mg total) by mouth at bedtime. Qty: 30 tablet, Refills: 0      CONTINUE these medications which have CHANGED   Details  HYDROcodone-acetaminophen (NORCO/VICODIN) 5-325 MG tablet Take 1 tablet by mouth every 6 (six) hours as needed for moderate pain. Qty: 20 tablet, Refills: 0      CONTINUE these medications which have NOT CHANGED   Details  atorvastatin (LIPITOR) 40 MG tablet Take 1 tablet (40 mg total) by mouth daily at 6 PM. Qty: 30 tablet, Refills: 0    clopidogrel (PLAVIX) 75 MG tablet Take 75 mg by mouth daily.    docusate sodium (COLACE) 100 MG capsule Take 1 capsule (100 mg total) by mouth 2 (two) times daily as needed for mild constipation. Qty: 10 capsule, Refills: 0  furosemide (LASIX) 20 MG tablet Take 20 mg by mouth.    hydroxychloroquine (PLAQUENIL) 200 MG tablet Take 200 mg by mouth 2 (two) times daily.     nitroGLYCERIN (NITROSTAT) 0.4 MG SL tablet Place 1 tablet (0.4 mg total) under the tongue every 5 (five) minutes as needed for chest pain. Qty: 30 tablet, Refills:  12         DISCHARGE INSTRUCTIONS:   Follow-up with primary care physician at the facility in 2-3 days. PCP to follow up on the pending T3 result Follow-up with psychiatry in 1-2 weeks  continue home oxygen 2-3 L via nasal cannula   DIET:  Cardiac diet  DISCHARGE CONDITION:  Fair  ACTIVITY:  Activity as tolerated per PT  OXYGEN:  Home Oxygen: Yes.     Oxygen Delivery: 2-3 liters/min via Patient connected to nasal cannula oxygen  DISCHARGE LOCATION:  nursing home   If you experience worsening of your admission symptoms, develop shortness of breath, life threatening emergency, suicidal or homicidal thoughts you must seek medical attention immediately by calling 911 or calling your MD immediately  if symptoms less severe.  You Must read complete instructions/literature along with all the possible adverse reactions/side effects for all the Medicines you take and that have been prescribed to you. Take any new Medicines after you have completely understood and accpet all the possible adverse reactions/side effects.   Please note  You were cared for by a hospitalist during your hospital stay. If you have any questions about your discharge medications or the care you received while you were in the hospital after you are discharged, you can call the unit and asked to speak with the hospitalist on call if the hospitalist that took care of you is not available. Once you are discharged, your primary care physician will handle any further medical issues. Please note that NO REFILLS for any discharge medications will be authorized once you are discharged, as it is imperative that you return to your primary care physician (or establish a relationship with a primary care physician if you do not have one) for your aftercare needs so that they can reassess your need for medications and monitor your lab values.     Today  Chief Complaint  Patient presents with  . Failure To Thrive    Patient is feeling much better today tolerating diet. Lives on chronic oxygen. Denies any complaints. Agreeable with the rehabilitation. Has chronic right lower extent the wound and getting wound care  ROS:  CONSTITUTIONAL: Denies fevers, chills. Denies any fatigue, weakness.  EYES: Denies blurry vision, double vision, eye pain. EARS, NOSE, THROAT: Denies tinnitus, ear pain, hearing loss. RESPIRATORY: Denies cough, wheeze, shortness of breath.  CARDIOVASCULAR: Denies chest pain, palpitations, edema.  GASTROINTESTINAL: Denies nausea, vomiting, diarrhea, abdominal pain. Denies bright red blood per rectum. GENITOURINARY: Denies dysuria, hematuria. ENDOCRINE: Denies nocturia or thyroid problems. HEMATOLOGIC AND LYMPHATIC: Denies easy bruising or bleeding. SKIN: Denies rash or lesion. Right lower extremity wound healing well MUSCULOSKELETAL: Denies pain in neck, back, shoulder, knees, hips or arthritic symptoms.  NEUROLOGIC: Denies paralysis, paresthesias.  PSYCHIATRIC: Denies anxiety or depressive symptoms.   VITAL SIGNS:  Blood pressure 138/66, pulse 96, temperature 98.5 F (36.9 C), temperature source Oral, resp. rate 16, height 5\' 2"  (1.575 m), weight 78.7 kg (173 lb 9.6 oz), SpO2 99 %.  I/O:    Intake/Output Summary (Last 24 hours) at 07/30/16 1421 Last data filed at 07/30/16 1300  Gross per 24 hour  Intake  480 ml  Output                0 ml  Net              480 ml    PHYSICAL EXAMINATION:  GENERAL:  60 y.o.-year-old patient lying in the bed with no acute distress.  EYES: Pupils equal, round, reactive to light and accommodation. No scleral icterus. Extraocular muscles intact.  HEENT: Head atraumatic, normocephalic. Oropharynx and nasopharynx clear.  NECK:  Supple, no jugular venous distention. No thyroid enlargement, no tenderness.  LUNGS: Normal breath sounds bilaterally, no wheezing, rales,rhonchi or crepitation. No use of accessory muscles of respiration.   CARDIOVASCULAR: S1, S2 normal. No murmurs, rubs, or gallops.  ABDOMEN: Soft, non-tender, non-distended. Bowel sounds present. No organomegaly or mass.  EXTREMITIES:Right leg wound healing well with scarring tissue No pedal edema, cyanosis, or clubbing.  NEUROLOGIC: Cranial nerves II through XII are intact. Muscle strength 5/5 in all extremities. Sensation intact. Gait not checked.  PSYCHIATRIC: The patient is alert and oriented x 3.  SKIN: No obvious rash, lesion, or ulcer.   DATA REVIEW:   CBC  Recent Labs Lab 07/29/16 0337  WBC 3.9  HGB 11.0*  HCT 33.4*  PLT 116*    Chemistries   Recent Labs Lab 07/27/16 1654  07/29/16 0337  NA 139  < > 140  K 3.7  < > 3.4*  CL 107  < > 111  CO2 23  < > 24  GLUCOSE 52*  < > 86  BUN 11  < > 8  CREATININE 0.79  < > 0.73  CALCIUM 8.2*  < > 7.6*  AST 107*  --   --   ALT 41  --   --   ALKPHOS 225*  --   --   BILITOT 1.6*  --   --   < > = values in this interval not displayed.  Cardiac Enzymes No results for input(s): TROPONINI in the last 168 hours.  Microbiology Results  Results for orders placed or performed during the hospital encounter of 07/27/16  Urine culture     Status: Abnormal   Collection Time: 07/27/16  4:54 PM  Result Value Ref Range Status   Specimen Description URINE, RANDOM  Final   Special Requests NONE  Final   Culture MULTIPLE SPECIES PRESENT, SUGGEST RECOLLECTION (A)  Final   Report Status 07/29/2016 FINAL  Final    RADIOLOGY:  US Soft Tissue Head And Neck  Result Date: 07/30/2016 CLINICAL DATA:  Elevated TSH EXAM: THYROID ULTRASOUND TECHNIQUE: Ultrasound examination of the thyroid gland and adjacent soft tissues was performed. COMPARISON:  None available FINDINGS: Parenchymal Echotexture: Normal Estimated total number of nodules >/= 1 cm: 0 Number of spongiform nodules >/=  2 cm not described below (TR1): 0 Number of mixed cystic and solid nodules >/= 1.5 cm not described below (TR2): 0  _________________________________________________________ Isthmus: 5 mm No discrete nodules are identified within the thyroid isthmus. _________________________________________________________ Right lobe: 3.2 x 1.7 x 1.7 cm No discrete nodules are identified within the right lobe of the thyroid. _________________________________________________________ Left lobe: 3.4 x 1.7 x 1.8 cm No discrete nodules are identified within the left lobe of the thyroid. IMPRESSION: Normal thyroid ultrasound for age The above is in keeping with the ACR TI-RADS recommendations - J Am Coll Radiol 2017;14:587-595. Electronically Signed   By: Judie Petit.  Shick M.D.   On: 07/30/2016 08:10    EKG:   Orders placed or performed during  the hospital encounter of 07/27/16  . EKG 12-Lead  . EKG 12-Lead      Management plans discussed with the patient, family and they are in agreement.  CODE STATUS:     Code Status Orders        Start     Ordered   07/27/16 2351  Full code  Continuous     07/27/16 2350    Code Status History    Date Active Date Inactive Code Status Order ID Comments User Context   06/02/2016 10:27 PM 06/07/2016  6:14 PM Full Code [id number]  Oralia Manis, MD Inpatient   01/17/2016  2:33 AM 01/19/2016  9:16 PM Full Code [id number]  Oralia Manis, MD ED      TOTAL TIME TAKING CARE OF THIS PATIENT: 45  minutes.   Note: This dictation was prepared with Dragon dictation along with smaller phrase technology. Any transcriptional errors that result from this process are unintentional.   @M[facility] @  on 07/30/2016 at 2:21 PM  Between 7am to 6pm - Pager - [phone]  After 6pm go to [email or web address] - password [email or web address]  [place] [facility]  Office  [phone]  CC: Primary care physician; [phone]

## 2016-07-30 NOTE — Progress Notes (Signed)
Physical Therapy Treatment Patient Details Name: [name] MRN: [id number] DOB: 08/24/1955 Today's Date: 07/30/2016    History of Present Illness 60 yo female with onset of UTI after recent admit for RLE wounds with current unna boot and coban tx.  PMHx:  wound infection, LE wounds, interstital lung disease, lupus, and RA, lung CA, CVA, COPD    PT Comments    Pt able to significantly increase her ambulation distance on this date however does report notable DOE with ambulation. VSS on 3 L/min O2 however pt does experience and increase in respiration and HR related to activity. She continues to require assist for bed mobility and transfer due to weakness. Pt able to complete all seated exercises as instructed but with slight increase in time due to fatigue. Pt will need SNF placement at discharge in order to facilitate safe return home. Pt will benefit from skilled PT services to address deficits in strength, balance, and mobility in order to return to full function at home.    Follow Up Recommendations  SNF     Equipment Recommendations  None recommended by PT    Recommendations for Other Services       Precautions / Restrictions Precautions Precautions: Fall Precaution Comments: ck sats and pulses Required Braces or Orthoses:  (Pt has a soft wrap she likes to be on LLE) Restrictions Weight Bearing Restrictions: No    Mobility  Bed Mobility Overal bed mobility: Needs Assistance Bed Mobility: Supine to Sit;Sit to Supine     Supine to sit: Min assist Sit to supine: Min assist   General bed mobility comments: Pt requires assist for bilateral LEs with supine to sit as well as assist to scoot forwward on bed. Moves slowly with labored respiration  Transfers Overall transfer level: Needs assistance Equipment used: Rolling walker (2 wheeled) Transfers: Sit to/from Stand Sit to Stand: Min assist;+2 physical assistance         General transfer comment: Pt requires  minA+1 and pulls up on walker during transfer. Requires increased time to come upright to standing. Pt is unsteady in standing and requires UE assist on walker to stabilize.  Ambulation/Gait Ambulation/Gait assistance: Min assist Ambulation Distance (Feet): 75 Feet Assistive device: Rolling walker (2 wheeled) Gait Pattern/deviations: Decreased step length - right;Decreased step length - left Gait velocity: Decreased Gait velocity interpretation: <1.8 ft/sec, indicative of risk for recurrent falls General Gait Details: Pt able to ambulate with therapist and +2 present for chair follow. O2 donned during ambulation at 3L/min and SaO2 remains around 90-93% throughout ambultion distance. Pt reports DOE and fatigue with ambulation. Requires seated rest break after 75' and unable to ambulate farther. During ambulation pt requires cues for safe use of rolling walker   Stairs            Wheelchair Mobility    Modified Rankin (Stroke Patients Only)       Balance Overall balance assessment: Needs assistance Sitting-balance support: Feet supported Sitting balance-Leahy Scale: Fair     Standing balance support: Bilateral upper extremity supported Standing balance-Leahy Scale: Poor                      Cognition Arousal/Alertness: Awake/alert Behavior During Therapy: WFL for tasks assessed/performed Overall Cognitive Status: Within Functional Limits for tasks assessed                      Exercises General Exercises - Lower Extremity Long Arc Quad: Strengthening;Both;Seated;15 reps Heel  Slides: Strengthening;Both;Seated;15 reps Hip ABduction/ADduction: Strengthening;Both;Seated;15 reps Hip Flexion/Marching: Strengthening;Both;Seated;15 reps Heel Raises: Strengthening;Both;15 reps;Seated    General Comments        Pertinent Vitals/Pain Pain Assessment: No/denies pain Pain Intervention(s): Monitored during session    Home Living                       Prior Function            PT Goals (current goals can now be found in the care plan section) Acute Rehab PT Goals Patient Stated Goal: to walk without help PT Goal Formulation: With patient Time For Goal Achievement: 08/11/16 Potential to Achieve Goals: Good Progress towards PT goals: Progressing toward goals    Frequency    Min 2X/week      PT Plan Current plan remains appropriate    Co-evaluation             End of Session Equipment Utilized During Treatment: Gait belt;Oxygen Activity Tolerance: Patient limited by fatigue Patient left: with call bell/phone within reach;in chair;with chair alarm set     Time: 0945-1010 PT Time Calculation (min) (ACUTE ONLY): 25 min  Charges:  $Gait Training: 8-22 mins $Therapeutic Exercise: 8-22 mins                    G Codes:      [name] PT, DPT   [name] 07/30/2016, 10:32 AM

## 2016-07-30 NOTE — Clinical Social Work Placement (Signed)
   CLINICAL SOCIAL WORK PLACEMENT  NOTE  Date:  07/30/2016  Patient Details  Name: [name] MRN: [id number] Date of Birth: 1956/05/18  Clinical Social Work is seeking post-discharge placement for this patient at the Skilled  Nursing Facility level of care (*CSW will initial, date and re-position this form in  chart as items are completed):  Yes   Patient/family provided with [place] Clinical Social Work Department's list of facilities offering this level of care within the geographic area requested by the patient (or if unable, by the patient's family).  Yes   Patient/family informed of their freedom to choose among providers that offer the needed level of care, that participate in Medicare, Medicaid or managed care program needed by the patient, have an available bed and are willing to accept the patient.  Yes   Patient/family informed of [facility]'s ownership interest in [facility] and [facility], as well as of the fact that they are under no obligation to receive care at these facilities.  PASRR submitted to EDS on       PASRR number received on       Existing PASRR number confirmed on 07/28/16     FL2 transmitted to all facilities in geographic area requested by pt/family on 07/28/16     FL2 transmitted to all facilities within larger geographic area on       Patient informed that his/her managed care company has contracts with or will negotiate with certain facilities, including the following:        Yes   Patient/family informed of bed offers received.  Patient chooses bed at  [facility])     Physician recommends and patient chooses bed at      Patient to be transferred to  [facility] ) on 07/30/16.  Patient to be transferred to facility by  [facility] )     Patient family notified on 07/30/16 of transfer.  Name of family member notified:   (Patient's friend Artelia [name] is aware of D/C today. )     PHYSICIAN       Additional  Comment:    _______________________________________________ Jullian Previti, Darleen Crocker, LCSW 07/30/2016, 2:51 PM

## 2016-07-30 NOTE — Discharge Instructions (Signed)
Follow-up with primary care physician at the facility in 2-3 days. PCP to follow up on the pending T3 result Follow-up with psychiatry in 1-2 weeks  continue home oxygen 2-3 L via nasal cannula

## 2016-07-30 NOTE — Progress Notes (Signed)
Patient is medically stable for D/C to [facility] today. Per [facility] admissions coordinator at [facility] patient will go to room 407. Clinical Child psychotherapist (CSW) sent D/C orders to [facility] via [place]. RN will call report and arrange EMS for transport. Patient is aware of above. CSW contacted patient's friend Artelia [name] and made her aware of above. Please reconsult if future social work needs arise. CSW signing off.   [employer], LCSW [phone]

## 2016-07-30 NOTE — Care Management Important Message (Signed)
Important Message  Patient Details  Name: [name] MRN: [id number] Date of Birth: [birth year]   Medicare Important Message Given:  Yes    Marily Memos, RN 07/30/2016, 11:30 AM

## 2016-07-31 LAB — T3, FREE: T3 FREE: 2.6 pg/mL (ref 2.0–4.4)

## 2016-08-24 ENCOUNTER — Other Ambulatory Visit: Payer: Self-pay | Admitting: Specialist

## 2016-08-24 DIAGNOSIS — J986 Disorders of diaphragm: Secondary | ICD-10-CM

## 2016-08-24 DIAGNOSIS — R0602 Shortness of breath: Secondary | ICD-10-CM

## 2016-08-30 ENCOUNTER — Ambulatory Visit
Admission: RE | Admit: 2016-08-30 | Discharge: 2016-08-30 | Disposition: A | Discharge status: Home / Self Care | Payer: Medicare Other | Source: Ambulatory Visit | Attending: Specialist | Admitting: Specialist

## 2016-08-30 DIAGNOSIS — J9811 Atelectasis: Secondary | ICD-10-CM | POA: Insufficient documentation

## 2016-08-30 DIAGNOSIS — R0602 Shortness of breath: Secondary | ICD-10-CM | POA: Diagnosis not present

## 2016-08-30 DIAGNOSIS — J986 Disorders of diaphragm: Secondary | ICD-10-CM | POA: Insufficient documentation

## 2016-10-01 ENCOUNTER — Inpatient Hospital Stay: Payer: Medicare Other

## 2016-10-01 ENCOUNTER — Emergency Department: Payer: Medicare Other

## 2016-10-01 ENCOUNTER — Encounter: Payer: Self-pay | Admitting: Emergency Medicine

## 2016-10-01 ENCOUNTER — Inpatient Hospital Stay
Admission: EM | Admit: 2016-10-01 | Discharge: 2016-10-14 | DRG: 871 | Disposition: E | Payer: Medicare Other | Attending: Internal Medicine | Admitting: Internal Medicine

## 2016-10-01 DIAGNOSIS — R680 Hypothermia, not associated with low environmental temperature: Secondary | ICD-10-CM | POA: Diagnosis present

## 2016-10-01 DIAGNOSIS — J9602 Acute respiratory failure with hypercapnia: Secondary | ICD-10-CM | POA: Diagnosis present

## 2016-10-01 DIAGNOSIS — G9340 Encephalopathy, unspecified: Secondary | ICD-10-CM | POA: Diagnosis present

## 2016-10-01 DIAGNOSIS — E872 Acidosis: Secondary | ICD-10-CM | POA: Diagnosis present

## 2016-10-01 DIAGNOSIS — Z515 Encounter for palliative care: Secondary | ICD-10-CM | POA: Diagnosis not present

## 2016-10-01 DIAGNOSIS — Z66 Do not resuscitate: Secondary | ICD-10-CM | POA: Diagnosis not present

## 2016-10-01 DIAGNOSIS — Z8673 Personal history of transient ischemic attack (TIA), and cerebral infarction without residual deficits: Secondary | ICD-10-CM

## 2016-10-01 DIAGNOSIS — R34 Anuria and oliguria: Secondary | ICD-10-CM | POA: Diagnosis present

## 2016-10-01 DIAGNOSIS — A4150 Gram-negative sepsis, unspecified: Secondary | ICD-10-CM | POA: Diagnosis present

## 2016-10-01 DIAGNOSIS — M329 Systemic lupus erythematosus, unspecified: Secondary | ICD-10-CM | POA: Diagnosis present

## 2016-10-01 DIAGNOSIS — J449 Chronic obstructive pulmonary disease, unspecified: Secondary | ICD-10-CM | POA: Diagnosis present

## 2016-10-01 DIAGNOSIS — J849 Interstitial pulmonary disease, unspecified: Secondary | ICD-10-CM | POA: Diagnosis present

## 2016-10-01 DIAGNOSIS — N179 Acute kidney failure, unspecified: Secondary | ICD-10-CM | POA: Diagnosis present

## 2016-10-01 DIAGNOSIS — Z87891 Personal history of nicotine dependence: Secondary | ICD-10-CM

## 2016-10-01 DIAGNOSIS — E785 Hyperlipidemia, unspecified: Secondary | ICD-10-CM | POA: Diagnosis present

## 2016-10-01 DIAGNOSIS — R627 Adult failure to thrive: Secondary | ICD-10-CM | POA: Diagnosis present

## 2016-10-01 DIAGNOSIS — I248 Other forms of acute ischemic heart disease: Secondary | ICD-10-CM | POA: Diagnosis present

## 2016-10-01 DIAGNOSIS — J9600 Acute respiratory failure, unspecified whether with hypoxia or hypercapnia: Secondary | ICD-10-CM

## 2016-10-01 DIAGNOSIS — Z4659 Encounter for fitting and adjustment of other gastrointestinal appliance and device: Secondary | ICD-10-CM

## 2016-10-01 DIAGNOSIS — D65 Disseminated intravascular coagulation [defibrination syndrome]: Secondary | ICD-10-CM | POA: Diagnosis present

## 2016-10-01 DIAGNOSIS — Z7902 Long term (current) use of antithrombotics/antiplatelets: Secondary | ICD-10-CM

## 2016-10-01 DIAGNOSIS — R6521 Severe sepsis with septic shock: Secondary | ICD-10-CM | POA: Diagnosis present

## 2016-10-01 DIAGNOSIS — J9601 Acute respiratory failure with hypoxia: Secondary | ICD-10-CM | POA: Diagnosis present

## 2016-10-01 DIAGNOSIS — R4182 Altered mental status, unspecified: Secondary | ICD-10-CM

## 2016-10-01 DIAGNOSIS — Z90710 Acquired absence of both cervix and uterus: Secondary | ICD-10-CM

## 2016-10-01 DIAGNOSIS — E162 Hypoglycemia, unspecified: Secondary | ICD-10-CM | POA: Diagnosis present

## 2016-10-01 DIAGNOSIS — I10 Essential (primary) hypertension: Secondary | ICD-10-CM | POA: Diagnosis present

## 2016-10-01 DIAGNOSIS — R109 Unspecified abdominal pain: Secondary | ICD-10-CM

## 2016-10-01 DIAGNOSIS — Z9981 Dependence on supplemental oxygen: Secondary | ICD-10-CM

## 2016-10-01 DIAGNOSIS — Z8249 Family history of ischemic heart disease and other diseases of the circulatory system: Secondary | ICD-10-CM

## 2016-10-01 DIAGNOSIS — R0600 Dyspnea, unspecified: Secondary | ICD-10-CM

## 2016-10-01 DIAGNOSIS — M069 Rheumatoid arthritis, unspecified: Secondary | ICD-10-CM | POA: Diagnosis present

## 2016-10-01 DIAGNOSIS — Z79899 Other long term (current) drug therapy: Secondary | ICD-10-CM

## 2016-10-01 DIAGNOSIS — D649 Anemia, unspecified: Secondary | ICD-10-CM | POA: Diagnosis present

## 2016-10-01 DIAGNOSIS — Z9911 Dependence on respirator [ventilator] status: Secondary | ICD-10-CM

## 2016-10-01 DIAGNOSIS — A419 Sepsis, unspecified organism: Secondary | ICD-10-CM | POA: Diagnosis present

## 2016-10-01 DIAGNOSIS — E871 Hypo-osmolality and hyponatremia: Secondary | ICD-10-CM | POA: Diagnosis present

## 2016-10-01 DIAGNOSIS — E876 Hypokalemia: Secondary | ICD-10-CM | POA: Diagnosis present

## 2016-10-01 HISTORY — DX: Systemic involvement of connective tissue, unspecified: M35.9

## 2016-10-01 LAB — BLOOD GAS, ARTERIAL
ACID-BASE DEFICIT: 9.4 mmol/L — AB (ref 0.0–2.0)
Acid-base deficit: 15.3 mmol/L — ABNORMAL HIGH (ref 0.0–2.0)
Acid-base deficit: 14.1 mmol/L — ABNORMAL HIGH (ref 0.0–2.0)
Allens test (pass/fail): POSITIVE — AB
BICARBONATE: 15.1 mmol/L — AB (ref 20.0–28.0)
BICARBONATE: 13.9 mmol/L — AB (ref 20.0–28.0)
Bicarbonate: 18 mmol/L — ABNORMAL LOW (ref 20.0–28.0)
FIO2: 0.28
FIO2: 1
FIO2: 1
HI FREQUENCY JET VENT RATE: 20
MECHVT: 450 mL
Mechanical Rate: 20
O2 SAT: 94.7 %
O2 SAT: 99.7 %
O2 Saturation: 56.2 %
PATIENT TEMPERATURE: 33.9
PATIENT TEMPERATURE: 37
PCO2 ART: 40 mmHg (ref 32.0–48.0)
PEEP/CPAP: 5 cmH2O
PEEP: 5 cmH2O
PH ART: 7.15 — AB (ref 7.350–7.450)
PO2 ART: 36 mmHg — AB (ref 83.0–108.0)
PO2 ART: 240 mmHg — AB (ref 83.0–108.0)
Patient temperature: 37
VT: 450 mL
pCO2 arterial: 45 mmHg (ref 32.0–48.0)
pCO2 arterial: 57 mmHg — ABNORMAL HIGH (ref 32.0–48.0)
pH, Arterial: 7.21 — ABNORMAL LOW (ref 7.350–7.450)
pH, Arterial: 7.07 — CL (ref 7.350–7.450)
pO2, Arterial: 89 mmHg (ref 83.0–108.0)

## 2016-10-01 LAB — COMPREHENSIVE METABOLIC PANEL
ALBUMIN: 1.4 g/dL — AB (ref 3.5–5.0)
ALK PHOS: 188 U/L — AB (ref 38–126)
ALT: 32 U/L (ref 14–54)
ANION GAP: 19 — AB (ref 5–15)
AST: 84 U/L — ABNORMAL HIGH (ref 15–41)
BILIRUBIN TOTAL: 3.2 mg/dL — AB (ref 0.3–1.2)
BUN: 7 mg/dL (ref 6–20)
CALCIUM: 7.9 mg/dL — AB (ref 8.9–10.3)
CO2: 17 mmol/L — ABNORMAL LOW (ref 22–32)
Chloride: 96 mmol/L — ABNORMAL LOW (ref 101–111)
Creatinine, Ser: 0.89 mg/dL (ref 0.44–1.00)
GFR calc non Af Amer: >60 mL/min (ref >60)
Potassium: 2.3 mmol/L — CL (ref 3.5–5.1)
Sodium: 132 mmol/L — ABNORMAL LOW (ref 135–145)
TOTAL PROTEIN: 4.4 g/dL — AB (ref 6.5–8.1)

## 2016-10-01 LAB — CBC
HEMATOCRIT: 26.1 % — AB (ref 35.0–47.0)
HEMATOCRIT: 28.9 % — AB (ref 35.0–47.0)
HEMOGLOBIN: 8.8 g/dL — AB (ref 12.0–16.0)
Hemoglobin: 9.5 g/dL — ABNORMAL LOW (ref 12.0–16.0)
MCH: 30.2 pg (ref 26.0–34.0)
MCH: 30.3 pg (ref 26.0–34.0)
MCHC: 33.5 g/dL (ref 32.0–36.0)
MCHC: 33 g/dL (ref 32.0–36.0)
MCV: 89.9 fL (ref 80.0–100.0)
MCV: 91.7 fL (ref 80.0–100.0)
PLATELETS: 55 10*3/uL — AB (ref 150–440)
Platelets: 57 10*3/uL — ABNORMAL LOW (ref 150–440)
RBC: 2.9 MIL/uL — ABNORMAL LOW (ref 3.80–5.20)
RBC: 3.15 MIL/uL — ABNORMAL LOW (ref 3.80–5.20)
RDW: 15.4 % — ABNORMAL HIGH (ref 11.5–14.5)
RDW: 15.5 % — AB (ref 11.5–14.5)
WBC: 3.5 10*3/uL — ABNORMAL LOW (ref 3.6–11.0)
WBC: 4.6 10*3/uL (ref 3.6–11.0)

## 2016-10-01 LAB — CREATININE, SERUM
Creatinine, Ser: 0.93 mg/dL (ref 0.44–1.00)
GFR calc Af Amer: >60 mL/min (ref >60)
GFR calc non Af Amer: >60 mL/min (ref >60)

## 2016-10-01 LAB — GLUCOSE, CAPILLARY
GLUCOSE-CAPILLARY: 142 mg/dL — AB (ref 65–99)
GLUCOSE-CAPILLARY: 30 mg/dL — AB (ref 65–99)
GLUCOSE-CAPILLARY: 102 mg/dL — AB (ref 65–99)
GLUCOSE-CAPILLARY: 31 mg/dL — AB (ref 65–99)
GLUCOSE-CAPILLARY: 65 mg/dL (ref 65–99)
GLUCOSE-CAPILLARY: 17 mg/dL — AB (ref 65–99)
GLUCOSE-CAPILLARY: 123 mg/dL — AB (ref 65–99)
GLUCOSE-CAPILLARY: 55 mg/dL — AB (ref 65–99)
Glucose-Capillary: 66 mg/dL (ref 65–99)
Glucose-Capillary: 121 mg/dL — ABNORMAL HIGH (ref 65–99)
Glucose-Capillary: 82 mg/dL (ref 65–99)
Glucose-Capillary: 140 mg/dL — ABNORMAL HIGH (ref 65–99)

## 2016-10-01 LAB — PROTIME-INR
INR: 2.61
INR: 2.7
PROTHROMBIN TIME: 28.4 s — AB (ref 11.4–15.2)
Prothrombin Time: 29.2 seconds — ABNORMAL HIGH (ref 11.4–15.2)

## 2016-10-01 LAB — MAGNESIUM
MAGNESIUM: 1 mg/dL — AB (ref 1.7–2.4)
MAGNESIUM: 2.4 mg/dL (ref 1.7–2.4)

## 2016-10-01 LAB — URINALYSIS, COMPLETE (UACMP) WITH MICROSCOPIC
BILIRUBIN URINE: NEGATIVE
Glucose, UA: NEGATIVE mg/dL
KETONES UR: NEGATIVE mg/dL
LEUKOCYTES UA: NEGATIVE
Nitrite: NEGATIVE
PH: 6 (ref 5.0–8.0)
Protein, ur: NEGATIVE mg/dL
SPECIFIC GRAVITY, URINE: 1.006 (ref 1.005–1.030)

## 2016-10-01 LAB — CBC WITH DIFFERENTIAL/PLATELET
BLASTS: 0 %
Band Neutrophils: 6 %
Basophils Absolute: 0 10*3/uL (ref 0–0.1)
Basophils Relative: 0 %
Eosinophils Absolute: 0 10*3/uL (ref 0–0.7)
Eosinophils Relative: 0 %
HEMATOCRIT: 29.7 % — AB (ref 35.0–47.0)
HEMOGLOBIN: 9.9 g/dL — AB (ref 12.0–16.0)
LYMPHS PCT: 6 %
Lymphs Abs: 0.2 10*3/uL — ABNORMAL LOW (ref 1.0–3.6)
MCH: 29.7 pg (ref 26.0–34.0)
MCHC: 33.4 g/dL (ref 32.0–36.0)
MCV: 88.9 fL (ref 80.0–100.0)
Metamyelocytes Relative: 0 %
Monocytes Absolute: 0 10*3/uL — ABNORMAL LOW (ref 0.2–0.9)
Monocytes Relative: 0 %
Myelocytes: 0 %
NEUTROS PCT: 88 %
NRBC: 3 /100{WBCs} — AB
Neutro Abs: 2.9 10*3/uL (ref 1.4–6.5)
OTHER: 0 %
PROMYELOCYTES ABS: 0 %
Platelets: 28 10*3/uL — CL (ref 150–440)
RBC: 3.34 MIL/uL — ABNORMAL LOW (ref 3.80–5.20)
RDW: 15 % — ABNORMAL HIGH (ref 11.5–14.5)
WBC: 3.1 10*3/uL — ABNORMAL LOW (ref 3.6–11.0)

## 2016-10-01 LAB — BASIC METABOLIC PANEL
ANION GAP: 10 (ref 5–15)
BUN: 7 mg/dL (ref 6–20)
CO2: 16 mmol/L — AB (ref 22–32)
CREATININE: 1.02 mg/dL — AB (ref 0.44–1.00)
Calcium: 6.3 mg/dL — CL (ref 8.9–10.3)
Chloride: 107 mmol/L (ref 101–111)
GFR calc non Af Amer: 58 mL/min — ABNORMAL LOW (ref >60)
Glucose, Bld: 73 mg/dL (ref 65–99)
Potassium: 2 mmol/L — CL (ref 3.5–5.1)
SODIUM: 133 mmol/L — AB (ref 135–145)

## 2016-10-01 LAB — CREATININE, URINE, RANDOM: Creatinine, Urine: 53 mg/dL

## 2016-10-01 LAB — TYPE AND SCREEN
ABO/RH(D): O POS
Antibody Screen: NEGATIVE

## 2016-10-01 LAB — SODIUM, URINE, RANDOM: SODIUM UR: 44 mmol/L

## 2016-10-01 LAB — PHOSPHORUS: PHOSPHORUS: 4.2 mg/dL (ref 2.5–4.6)

## 2016-10-01 LAB — LACTIC ACID, PLASMA
LACTIC ACID, VENOUS: 6.6 mmol/L — AB (ref 0.5–1.9)
Lactic Acid, Venous: 6.8 mmol/L (ref 0.5–1.9)
Lactic Acid, Venous: 7.4 mmol/L (ref 0.5–1.9)

## 2016-10-01 LAB — FIBRINOGEN: FIBRINOGEN: 129 mg/dL — AB (ref 210–475)

## 2016-10-01 LAB — PROCALCITONIN: PROCALCITONIN: 3.02 ng/mL

## 2016-10-01 LAB — TROPONIN I
TROPONIN I: 0.05 ng/mL — AB (ref <0.03)
Troponin I: 0.06 ng/mL (ref <0.03)
Troponin I: 0.04 ng/mL (ref <0.03)

## 2016-10-01 LAB — OSMOLALITY, URINE: Osmolality, Ur: 196 mOsm/kg — ABNORMAL LOW (ref 300–900)

## 2016-10-01 LAB — FIBRIN DERIVATIVES D-DIMER (ARMC ONLY): Fibrin derivatives D-dimer (ARMC): 7010 — ABNORMAL HIGH (ref 0–499)

## 2016-10-01 LAB — TSH: TSH: 0.512 u[IU]/mL (ref 0.350–4.500)

## 2016-10-01 LAB — CORTISOL: CORTISOL PLASMA: 17 ug/dL

## 2016-10-01 LAB — APTT: APTT: 71 s — AB (ref 24–36)

## 2016-10-01 LAB — MRSA PCR SCREENING: MRSA BY PCR: NEGATIVE

## 2016-10-01 MED ORDER — DEXTROSE 50 % IV SOLN
1.0000 | Freq: Once | INTRAVENOUS | Status: AC
  Administered 2016-10-01: 50 mL via INTRAVENOUS

## 2016-10-01 MED ORDER — SENNOSIDES 8.8 MG/5ML PO SYRP: 5.0000 mL | ORAL_SOLUTION | Freq: Two times a day (BID) | ORAL | Status: DC | PRN

## 2016-10-01 MED ORDER — DEXTROSE-NACL 5-0.9 % IV SOLN: INTRAVENOUS | Status: DC

## 2016-10-01 MED ORDER — SODIUM CHLORIDE 0.9 % IV SOLN
30.0000 meq | Freq: Two times a day (BID) | INTRAVENOUS | Status: AC
  Administered 2016-10-01 – 2016-10-02 (×2): 30 meq via INTRAVENOUS
  Filled 2016-10-01 (×2): qty 15

## 2016-10-01 MED ORDER — VANCOMYCIN HCL IN DEXTROSE 1-5 GM/200ML-% IV SOLN
1000.0000 mg | Freq: Once | INTRAVENOUS | Status: AC
  Administered 2016-10-01: 1000 mg via INTRAVENOUS
  Filled 2016-10-01: qty 200

## 2016-10-01 MED ORDER — FENTANYL 2500MCG IN NS 250ML (10MCG/ML) PREMIX INFUSION
25.0000 ug/h | INTRAVENOUS | Status: DC
  Administered 2016-10-01: 50 ug/h via INTRAVENOUS
  Filled 2016-10-01: qty 250

## 2016-10-01 MED ORDER — MIDAZOLAM HCL 2 MG/2ML IJ SOLN
2.0000 mg | INTRAMUSCULAR | Status: DC | PRN
  Administered 2016-10-01: 2 mg via INTRAVENOUS

## 2016-10-01 MED ORDER — FAMOTIDINE IN NACL 20-0.9 MG/50ML-% IV SOLN
20.0000 mg | Freq: Two times a day (BID) | INTRAVENOUS | Status: DC
  Administered 2016-10-01 – 2016-10-02 (×2): 20 mg via INTRAVENOUS
  Filled 2016-10-01 (×2): qty 50

## 2016-10-01 MED ORDER — PIPERACILLIN-TAZOBACTAM 3.375 G IVPB
3.3750 g | Freq: Three times a day (TID) | INTRAVENOUS | Status: DC
  Administered 2016-10-01 – 2016-10-02 (×3): 3.375 g via INTRAVENOUS
  Filled 2016-10-01 (×3): qty 50

## 2016-10-01 MED ORDER — BISACODYL 10 MG RE SUPP: 10.0000 mg | Freq: Every day | RECTAL | Status: DC | PRN

## 2016-10-01 MED ORDER — FENTANYL BOLUS VIA INFUSION
50.0000 ug | INTRAVENOUS | Status: DC | PRN
Start: 2016-10-01 — End: 2016-10-02
  Filled 2016-10-01: qty 100

## 2016-10-01 MED ORDER — SODIUM CHLORIDE 0.9 % IV SOLN: 250.0000 mL | INTRAVENOUS | Status: DC | PRN

## 2016-10-01 MED ORDER — PIPERACILLIN-TAZOBACTAM 3.375 G IVPB 30 MIN
3.3750 g | Freq: Once | INTRAVENOUS | Status: AC
  Administered 2016-10-01: 3.375 g via INTRAVENOUS
  Filled 2016-10-01: qty 50

## 2016-10-01 MED ORDER — MIDAZOLAM HCL 2 MG/2ML IJ SOLN
2.0000 mg | INTRAMUSCULAR | Status: DC | PRN
  Filled 2016-10-01: qty 2

## 2016-10-01 MED ORDER — DEXTROSE 5 % IV SOLN
Freq: Once | INTRAVENOUS | Status: AC
  Administered 2016-10-01: 16:00:00 via INTRAVENOUS

## 2016-10-01 MED ORDER — CALCIUM GLUCONATE 10 % IV SOLN
1.0000 g | Freq: Once | INTRAVENOUS | Status: AC
  Administered 2016-10-01: 1 g via INTRAVENOUS
  Filled 2016-10-01: qty 10

## 2016-10-01 MED ORDER — VASOPRESSIN 20 UNIT/ML IV SOLN
0.0300 [IU]/min | INTRAVENOUS | Status: DC
  Administered 2016-10-01 – 2016-10-02 (×2): 0.03 [IU]/min via INTRAVENOUS
  Filled 2016-10-01 (×2): qty 2

## 2016-10-01 MED ORDER — DEXTROSE 50 % IV SOLN
INTRAVENOUS | Status: AC
  Administered 2016-10-01: 50 mL via INTRAVENOUS
  Filled 2016-10-01: qty 50

## 2016-10-01 MED ORDER — DEXTROSE-NACL 5-0.45 % IV SOLN: INTRAVENOUS | Status: DC

## 2016-10-01 MED ORDER — IOPAMIDOL (ISOVUE-300) INJECTION 61%: 30.0000 mL | Freq: Once | INTRAVENOUS | Status: DC

## 2016-10-01 MED ORDER — SODIUM CHLORIDE 0.9 % IV BOLUS (SEPSIS)
500.0000 mL | Freq: Once | INTRAVENOUS | Status: AC
  Administered 2016-10-01: 500 mL via INTRAVENOUS

## 2016-10-01 MED ORDER — MAGNESIUM SULFATE 4 GM/100ML IV SOLN
4.0000 g | Freq: Once | INTRAVENOUS | Status: AC
  Administered 2016-10-01: 4 g via INTRAVENOUS
  Filled 2016-10-01: qty 100

## 2016-10-01 MED ORDER — THROMBIN 5000 UNITS EX SOLR
5000.0000 [IU] | Freq: Once | CUTANEOUS | Status: AC
Start: 2016-10-01 — End: 2016-10-01
  Administered 2016-10-01: 5000 [IU] via TOPICAL
  Filled 2016-10-01: qty 5000

## 2016-10-01 MED ORDER — PHENYLEPHRINE HCL 10 MG/ML IJ SOLN
0.0000 ug/min | INTRAMUSCULAR | Status: DC
  Administered 2016-10-01: 30 ug/min via INTRAVENOUS
  Administered 2016-10-02: 20 ug/min via INTRAVENOUS
  Filled 2016-10-01: qty 1

## 2016-10-01 MED ORDER — MIDAZOLAM HCL 2 MG/2ML IJ SOLN
INTRAMUSCULAR | Status: AC
  Filled 2016-10-01: qty 4

## 2016-10-01 MED ORDER — SODIUM BICARBONATE 8.4 % IV SOLN
150.0000 meq | Freq: Once | INTRAVENOUS | Status: AC
  Administered 2016-10-01: 150 meq via INTRAVENOUS
  Filled 2016-10-01: qty 150

## 2016-10-01 MED ORDER — DEXTROSE 50 % IV SOLN
2.0000 | Freq: Once | INTRAVENOUS | Status: AC
  Administered 2016-10-01: 100 mL via INTRAVENOUS

## 2016-10-01 MED ORDER — NOREPINEPHRINE BITARTRATE 1 MG/ML IV SOLN
0.0000 ug/min | INTRAVENOUS | Status: DC
  Administered 2016-10-01: 10 ug/min via INTRAVENOUS
  Administered 2016-10-02 (×3): 40 ug/min via INTRAVENOUS
  Filled 2016-10-01 (×5): qty 16

## 2016-10-01 MED ORDER — SODIUM BICARBONATE 8.4 % IV SOLN
INTRAVENOUS | Status: DC
  Administered 2016-10-01: via INTRAVENOUS
  Filled 2016-10-01 (×2): qty 150

## 2016-10-01 MED ORDER — HEPARIN SODIUM (PORCINE) 5000 UNIT/ML IJ SOLN: 5000.0000 [IU] | Freq: Three times a day (TID) | INTRAMUSCULAR | Status: DC

## 2016-10-01 MED ORDER — SODIUM CHLORIDE 0.9 % IV SOLN
30.0000 meq | Freq: Once | INTRAVENOUS | Status: AC
  Administered 2016-10-01: 30 meq via INTRAVENOUS
  Filled 2016-10-01: qty 15

## 2016-10-01 MED ORDER — DEXTROSE 50 % IV SOLN
INTRAVENOUS | Status: AC
  Filled 2016-10-01: qty 100

## 2016-10-01 MED ORDER — SODIUM CHLORIDE 0.9 % IV SOLN: 10.0000 mL/h | Freq: Once | INTRAVENOUS | Status: DC

## 2016-10-01 MED ORDER — IOPAMIDOL (ISOVUE-300) INJECTION 61%
100.0000 mL | Freq: Once | INTRAVENOUS | Status: AC | PRN
  Administered 2016-10-01: 100 mL via INTRAVENOUS

## 2016-10-01 MED ORDER — SODIUM CHLORIDE 0.9 % IV BOLUS (SEPSIS)
1000.0000 mL | Freq: Once | INTRAVENOUS | Status: AC
  Administered 2016-10-01: 1000 mL via INTRAVENOUS

## 2016-10-01 MED ORDER — FENTANYL CITRATE (PF) 100 MCG/2ML IJ SOLN
100.0000 ug | Freq: Once | INTRAMUSCULAR | Status: AC
  Administered 2016-10-01: 100 ug via INTRAVENOUS

## 2016-10-01 MED ORDER — DEXTROSE 10 % IV SOLN
INTRAVENOUS | Status: DC
  Administered 2016-10-01: 19:00:00 via INTRAVENOUS

## 2016-10-01 MED ORDER — VANCOMYCIN HCL IN DEXTROSE 750-5 MG/150ML-% IV SOLN
750.0000 mg | Freq: Two times a day (BID) | INTRAVENOUS | Status: DC
  Administered 2016-10-01 – 2016-10-02 (×3): 750 mg via INTRAVENOUS
  Filled 2016-10-01 (×4): qty 150

## 2016-10-01 NOTE — Progress Notes (Signed)
Pulmonary/critical care on call  Procedure note Intubation Indication: Agonal respirations and respiratory failure Performed by Sonda Rumble Patient is moribund and unresponsive. Glide Scope Utilized under direct visualization and #7.5 endotracheal tube was passed without difficulty End-tidal CO2 noted Bilateral breath sounds appreciated Condensation endotracheal tube appreciated Improved oxygen saturations Stat portable chest x-ray ordered    Tora Kindred, D.O.

## 2016-10-01 NOTE — ED Notes (Signed)
Unable to obtain second blood culture.  Will continue with sepsis protocol.

## 2016-10-01 NOTE — H&P (Signed)
Pulmonary/critical care attending  History and physical dictated Please see progress notes for 09/25/2016 note  Tora Kindred, D.O.

## 2016-10-01 NOTE — Progress Notes (Addendum)
PHARMACY  CONSULT NOTE - INITIAL   Pharmacy Consult for Electrolyte management   Patient Measurements: Weight: 173 lb (78.5 kg)  Vital Signs: Temp: 92.3 F (33.5 C) (02/16 1815) Temp Source: Rectal (02/16 1140) BP: 65/48 (02/16 1800) Pulse Rate: 86 (02/16 1815)   Recent Labs  10/12/2016 1327 09/19/2016 1328  WBC  --  3.1*  HGB  --  9.9*  HCT  --  29.7*  PLT  --  28*  APTT 71*  --   CREATININE  --  0.89  LABCREA  --  53  MG  --  1.0*  ALBUMIN  --  1.4*  PROT  --  4.4*  AST  --  84*  ALT  --  32  ALKPHOS  --  188*  BILITOT  --  3.2*   Lab Results  Component Value Date   K 2.3 (LL) 09/30/2016    Estimated Creatinine Clearance: 64.4 mL/min (by C-G formula based on SCr of 0.89 mg/dL).   Microbiology: No results found for this or any previous visit (from the past 720 hour(s)).  Medical History: Past Medical History:  Diagnosis Date  . Collagen vascular disease (HCC)   . COPD (chronic obstructive pulmonary disease) (HCC)   . ILD (interstitial lung disease) (HCC)   . Lupus   . Stroke [facility])      Assessment: 61 yo female admitted to CCU with sepsis and hypokalemia. Pharmacy consulted for electrolyte management.   2/16:   K+: 2.3   Mg: 1.0  Plan:  KCL 30 mEq/222mL ordered in ED. Infusion will end at 2015 this evening.   Will order Magnesium 4gm Now. Will also  order  KCL 65mEq/250mL  X 2 bags to start at  2100 tonight. F/U labs ordered with AM labs.   Gardner Candle, PharmD, BCPS Clinical Pharmacist 09/17/2016 6:55 PM

## 2016-10-01 NOTE — Progress Notes (Signed)
ETT was 24cm at the lip, ETT pulled back to 22cm at lip per NP order.

## 2016-10-01 NOTE — ED Notes (Signed)
Lab at bedside attempting blood draw.  Dr Pershing Proud at bedside attempting IV

## 2016-10-01 NOTE — Progress Notes (Signed)
Notified Glenford Peers, NP that patient was unresponsive, bleeding from mouth and gurgling, O2 sats were in low 70's--non-rebreather placed, hypotensive. Glenford Peers, NP contacted patients POA and proceeded with intubation and central line placement. See new orders, will continue to monitor.

## 2016-10-01 NOTE — ED Notes (Signed)
Lab at bedside attempting to draw labs. 

## 2016-10-01 NOTE — ED Notes (Signed)
[name] medic attempted blood draw, unsuccessful.

## 2016-10-01 NOTE — ED Provider Notes (Signed)
[facility] Emergency Department Provider Note  ____________________________________________   First MD Initiated Contact with Patient Oct 30, 2016 1113     (approximate)  I have reviewed the triage vital signs and the nursing notes.   HISTORY  Chief Complaint Weakness and Altered Mental Status   HPI [name] is a 61 y.o. female with a history of COPD as well as lupus and interstitial lung disease was presenting with 1 month of failure to thrive. Per EMS, she has been eating and drinking less as well as being less active. However, last night she became nonverbal. Per EMS, there was no focal weakness reported.Patient is on home oxygen.   Past Medical History:  Diagnosis Date  . COPD (chronic obstructive pulmonary disease) (HCC)   . ILD (interstitial lung disease) (HCC)   . Lupus   . Stroke [facility])     Patient Active Problem List   Diagnosis Date Noted  . Severe major depression, single episode, without psychotic features (HCC) 07/29/2016  . Grief 07/29/2016  . UTI (urinary tract infection) 07/27/2016  . Wound infection 06/02/2016  . Chest pain 01/17/2016  . Lupus 01/17/2016  . H/O: stroke 01/17/2016    Past Surgical History:  Procedure Laterality Date  . ABDOMINAL HYSTERECTOMY    . APPENDECTOMY    . TONSILLECTOMY      Prior to Admission medications   Medication Sig Start Date End Date Taking? Authorizing Provider  acetaminophen (TYLENOL) 325 MG tablet Take 2 tablets (650 mg total) by mouth every 6 (six) hours as needed for mild pain (or Fever >/= 101). 07/30/16  Yes Ramonita Lab, MD  albuterol (PROVENTIL HFA;VENTOLIN HFA) 108 (90 Base) MCG/ACT inhaler Inhale 1 puff into the lungs every 6 (six) hours as needed for wheezing or shortness of breath.   Yes Historical Provider, MD  atorvastatin (LIPITOR) 40 MG tablet Take 1 tablet (40 mg total) by mouth daily at 6 PM. 01/19/16  Yes Wyatt Haste, MD  clopidogrel (PLAVIX) 75 MG tablet Take 75 mg  by mouth daily.   Yes Historical Provider, MD  docusate sodium (COLACE) 100 MG capsule Take 1 capsule (100 mg total) by mouth 2 (two) times daily as needed for mild constipation. 06/07/16  Yes Ramonita Lab, MD  furosemide (LASIX) 20 MG tablet Take 20 mg by mouth.   Yes Historical Provider, MD  HYDROcodone-acetaminophen (NORCO/VICODIN) 5-325 MG tablet Take 1 tablet by mouth every 6 (six) hours as needed for moderate pain. 07/30/16  Yes Ramonita Lab, MD  hydroxychloroquine (PLAQUENIL) 200 MG tablet Take 200 mg by mouth 2 (two) times daily.    Yes Historical Provider, MD  levothyroxine (SYNTHROID, LEVOTHROID) 50 MCG tablet Take 1.5 tablets (75 mcg total) by mouth daily before breakfast. 07/31/16  Yes Ramonita Lab, MD  megestrol (MEGACE) 400 MG/10ML suspension Take 400 mg by mouth daily.   Yes Historical Provider, MD  mirtazapine (REMERON) 7.5 MG tablet Take 1 tablet (7.5 mg total) by mouth at bedtime. Patient taking differently: Take 15 mg by mouth at bedtime.  07/30/16  Yes Ramonita Lab, MD  nitroGLYCERIN (NITROSTAT) 0.4 MG SL tablet Place 1 tablet (0.4 mg total) under the tongue every 5 (five) minutes as needed for chest pain. 01/19/16  Yes Wyatt Haste, MD  potassium chloride (K-DUR,KLOR-CON) 10 MEQ tablet Take 10 mEq by mouth daily.   Yes Historical Provider, MD  spironolactone (ALDACTONE) 25 MG tablet Take 25 mg by mouth daily.   Yes Historical Provider, MD  umeclidinium-vilanterol [facility]  ELLIPTA) 62.5-25 MCG/INH AEPB Inhale 1 puff into the lungs daily.   Yes Historical Provider, MD  feeding supplement, ENSURE ENLIVE, (ENSURE ENLIVE) LIQD Take 237 mLs by mouth 2 (two) times daily between meals. 07/31/16   Ramonita Lab, MD    Allergies Patient has no known allergies.  Family History  Problem Relation Age of Onset  . Hypertension Other     Social History Social History  Substance Use Topics  . Smoking status: Former Games developer  . Smokeless tobacco: Former Neurosurgeon  . Alcohol use No    Review of  Systems Level V caveat secondary to patient nonverbal.  ____________________________________________   PHYSICAL EXAM:  VITAL SIGNS: ED Triage Vitals  Enc Vitals Group     BP 10/06/2016 1130 (!) 87/61     Pulse Rate 09/25/2016 1140 77     Resp 09/23/2016 1130 (!) 29     Temp 09/22/2016 1140 (!) 89.7 F (32.1 C)     Temp Source 09/16/2016 1140 Rectal     SpO2 09/24/2016 1140 91 %     Weight 09/17/2016 1142 173 lb (78.5 kg)     Height --      Head Circumference --      Peak Flow --      Pain Score --      Pain Loc --      Pain Edu? --      Excl. in GC? --     Constitutional: Alert and in no acute distress. Eyes: Conjunctivae are normal. PERRL. EOMI. Head: Atraumatic. Nose: No congestion/rhinnorhea. Mouth/Throat: Mucous membranes are Dry and with a small amount of crusted blood around the lips.    Neck: No stridor.   Cardiovascular: Normal rate, regular rhythm. Grossly normal heart sounds.   Respiratory: Normal respiratory effort.  No retractions. Lungs CTAB. Gastrointestinal: Soft and with mild diffuse tenderness to the abdomen. The patient grimaces when I palpate her abdomen. No distention.     Musculoskeletal: Moderate to severe bilateral lower summary edema which she messes is chronic. Neurologic: Patient nonverbal but moves all 4 extremities equally. Able to follow simple commands. Skin:  Skin is warm, dry and intact. No rash noted.  ____________________________________________   LABS (all labs ordered are listed, but only abnormal results are displayed)  Labs Reviewed  LACTIC ACID, PLASMA - Abnormal; Notable for the following:       Result Value   Lactic Acid, Venous 6.8 (*)    All other components within normal limits  CULTURE, BLOOD (ROUTINE X 2)  CULTURE, BLOOD (ROUTINE X 2)  URINE CULTURE  CBC WITH DIFFERENTIAL/PLATELET  LACTIC ACID, PLASMA  URINALYSIS, COMPLETE (UACMP) WITH MICROSCOPIC  CBC WITH DIFFERENTIAL/PLATELET  COMPREHENSIVE METABOLIC PANEL  TROPONIN I  TSH   TYPE AND SCREEN   ____________________________________________  EKG  ED ECG REPORT I, Arelia Longest, the attending physician, personally viewed and interpreted this ECG.   Date: 09/29/2016  EKG Time: 1234  Rate: 81  Rhythm: normal sinus rhythm  Axis: Normal axis  Intervals: Prolonged QT interval of 560.  ST&T Change: No ST segment elevation or depression. No abnormal T-wave inversion.  ____________________________________________  RADIOLOGY  DG Chest Port 1 View (Final result)  Result time 10/03/2016 11:37:49  Final result by Joellyn Haff, MD (09/25/2016 11:37:49)           Narrative:   CLINICAL DATA: Altered mental status. Former smoker.  EXAM: PORTABLE CHEST 1 VIEW  COMPARISON: 07/16/2016  FINDINGS: There are low lung volumes.  There is mild left basilar atelectasis. There is no focal parenchymal opacity. There is no pleural effusion or pneumothorax. The heart and mediastinal contours are unremarkable.  The osseous structures are unremarkable.  IMPRESSION: No active disease.   Electronically Signed By: Elige Ko On: 09/20/2016 11:37          ____________________________________________   PROCEDURES  Procedure(s) performed:   Procedures  Critical Care performed: Yes, see critical care note(s) CRITICAL CARE Performed by: Arelia Longest   Total critical care time: 35 minutes  Critical care time was exclusive of separately billable procedures and treating other patients.  Critical care was necessary to treat or prevent imminent or life-threatening deterioration.  Critical care was time spent personally by me on the following activities: development of treatment plan with patient and/or surrogate as well as nursing, discussions with consultants, evaluation of patient's response to treatment, examination of patient, obtaining history from patient or surrogate, ordering and performing treatments and interventions, ordering and  review of laboratory studies, ordering and review of radiographic studies, pulse oximetry and re-evaluation of patient's condition.  Angiocath insertion Performed by: Arelia Longest  Consent: Verbal consent obtained. Risks and benefits: risks, benefits and alternatives were discussed Time out: Immediately prior to procedure a "time out" was called to verify the correct patient, procedure, equipment, support staff and site/side marked as required.  Preparation: Patient was prepped and draped in the usual sterile fashion.  Vein Location:  Right basilic  Ultrasound Guided  Gauge: 18  Normal blood return and flush without difficulty Patient tolerance: Patient tolerated the procedure well with no immediate complications.  Angiocath insertion Performed by: Arelia Longest  Consent: Verbal consent obtained. Risks and benefits: risks, benefits and alternatives were discussed Time out: Immediately prior to procedure a "time out" was called to verify the correct patient, procedure, equipment, support staff and site/side marked as required.  Preparation: Patient was prepped and draped in the usual sterile fashion.  Vein Location: right ej  Gauge: 18  Normal blood return and flush without difficulty Patient tolerance: Patient tolerated the procedure well with no immediate complications.  East because of the right basilic vein line infiltrating.   ____________________________________________   INITIAL IMPRESSION / ASSESSMENT AND PLAN / ED COURSE  Pertinent labs & imaging results that were available during my care of the patient were reviewed by me and considered in my medical decision making (see chart for details).  ----------------------------------------- 1:10 PM on 10/11/2016 ----------------------------------------- Patient with lactic acid of 6.8. Also hypotensive. Sepsis alert called and patient receiving fluid bolus as well as empiric antibiotics.       ----------------------------------------- 1:30 PM on 10/04/2016 ----------------------------------------- Discussed the case with Dr. Lonn Georgia of critical care medicine who will evaluate the patient. Patient to be admitted to the ICU. Pressure remains in the 80s. Labs are still pending.  ----------------------------------------- 4:05 PM on 09/25/2016 -----------------------------------------  Patient now with oozing around IV sites. Discussed the case with Dr. Arsenio Loader of the ICU service who recommends a unit of platelets. Patient also found on her blood work to have a hypoglycemia of less than 20. Patient is not a diabetic and is not listed on being diabetic medications. Family also reports the patient does not a diagnosis of diabetes. Discussed the case with the patient's daughter, Ms.Winstead, who is driving at this time up from [place]. She would like the patient to be a full code at this time and be placed on the ventilator until she arrives and  is able to evaluate the patient further. Patient also pending CAT scan at this time because of her diffuse abdominal pain as well as elevated liver labs and elevated bilirubin. The advance directive desires were also communicated to the intensivist, Dr. Dellie Catholic.   ____________________________________________   FINAL CLINICAL IMPRESSION(S) / ED DIAGNOSES  Septic shock. Transaminitis. Hypoglycemia. Hyperbilirubinemia. Abdominal pain. Altered mental status.   NEW MEDICATIONS STARTED DURING THIS VISIT:  New Prescriptions   No medications on file     Note:  This document was prepared using Dragon voice recognition software and may include unintentional dictation errors.    Myrna Blazer, MD 10-23-16 [phone]

## 2016-10-01 NOTE — Progress Notes (Addendum)
Pharmacy Antibiotic Note  [name] is a 61 y.o. female admitted on 2016/10/17 with sepsis.  Pharmacy has been consulted for Zosyn and Vancomycin dosing. Patient received 1 dose Vancomycin 1 gm IV and Zosyn 3.375 IV once.   Plan: Ke: 0.058   Vd: 43   T1/2: 12  Will start the patient on vancomycin 750mg  IV every 12 hours with 6 hour stack dosing. Calculated trough at Css is 16.7. Will order trough prior to 5th dose.  Plan to monitor renal function daily and adjust dose as needed.   Will start Zosyn 3.375 IV EI every 8 hours.   Weight: 173 lb (78.5 kg)  Temp (24hrs), Avg:90.2 F (32.3 C), Min:89.7 F (32.1 C), Max:90.6 F (32.6 C)   Recent Labs Lab Oct 17, 2016 1146 2016-10-17 1328  WBC  --  3.1*  CREATININE  --  0.89  LATICACIDVEN 6.8*  --     Estimated Creatinine Clearance: 64.4 mL/min (by C-G formula based on SCr of 0.89 mg/dL).    No Known Allergies  Antimicrobials this admission: 2/16 Zosyn  >>  2/16 Vancomycin >>   Dose adjustments this admission:  Microbiology results: 2/16  BCx: pending 2/16  UCx: pending MRSA PCR:   Thank you for allowing pharmacy to be a part of this patient's care.  3/16, PharmD, BCPS Clinical Pharmacist 17-Oct-2016 3:44 PM

## 2016-10-01 NOTE — Progress Notes (Addendum)
[facility] Critical Care Medicine Progess Note    ASSESSMENT/PLAN   Hypothermia: Patient is hypothermic, presently on a bear hugger, blood urine and sputum cultures with empiric broad-spectrum antibiotics, will check TSH and cortisol level  Sepsis: See hypothermia, will add vancomycin and Zosyn empirically  Hypokalemia: We'll replace and place on a I replacement protocol And repeat potassium post infusion  Hyponatremia: Saline resuscitation, will obtain spot urinary sodium osm and creatinine  Leukopenia: Most likely sepsis with marrow suppression. Patient is also on Plaquenil with her history of rheumatoid arthritis/SLE.  Anemia: No evidence of active bleeding  Hypoglycemia: We'll place on D5 infusion  Acid-based derangement: Anion gap of 19, delta gap of 7, calculated starting bicarbonate of 24 consistent with a simple anion gap metabolic acidosis. Arterial blood gas not obtained. Most likely consistent with sepsis, borderline blood pressure with elevated lactic acid of 6.8  Positive troponin: Will obtain serial cardiac enzymes and EKG  Thrombocytopenia: Sepsis-induced marrow suppression possible, also on multiple pharmacologic agents which may cause thrombocytopenia Plaquenil being held  Name: [name] MRN: [id number] DOB: 26-Mar-1956    ADMISSION DATE:  10/10/16    CHIEF COMPLAINT:  Altered mental status   HPI: Transported to emergency department by EMS from [facility] with complaint of weakness, altered mental status increasing over the past week with Failure to thrive over one month. Patient is presently nonverbal, hypothermic with a temperature of 89.7. She has a past medical history remarkable for CVA, lupus, hepatitis C, rheumatoid arthritis, hypertension, hyperlipidemia, hypercalcemia, thrombocytopenia prior history of right hemidiaphragm paralysis, interstitial lung disease, major depressive disorder.  SUBJECTIVE:   Patient is somnolent but responsive.  She is nonverbal. Presently hypothermic on a bear hugger   Review of Systems:  Constitutional: Feels well. Cardiovascular: No chest pain.  Pulmonary: Denies dyspnea.   The remainder of systems were reviewed and were found to be negative other than what is documented in the HPI.    VITAL SIGNS: Temp:  [89.7 F (32.1 C)-90.6 F (32.6 C)] 90.6 F (32.6 C) (02/16 1430) Pulse Rate:  [77-85] 84 (02/16 1430) Resp:  [16-30] 25 (02/16 1430) BP: (81-92)/(26-68) 81/56 (02/16 1430) SpO2:  [91 %-99 %] 99 % (02/16 1430) Weight:  [78.5 kg (173 lb)] 78.5 kg (173 lb) (02/16 1142) HEMODYNAMICS:   VENTILATOR SETTINGS:   INTAKE / OUTPUT: No intake or output data in the 24 hours ending 2016/10/10 1512  PHYSICAL EXAMINATION: Physical Examination:   VS: BP (!) 81/56   Pulse 84   Temp (!) 90.6 F (32.6 C)   Resp (!) 25   Wt 78.5 kg (173 lb)   SpO2 99%   BMI 31.64 kg/m   General Appearance: Chronically ill-appearing  female  Neuro: Limited exam. Unable to cooperate, patient awake, positive pupillary response, HEENT: PERRLA, EOM intact. Pulmonary: normal breath sounds   CardiovascularNormal S1,S2.  No m/r/g.   Abdomen: Benign, Soft, non-tender. Renal:  No costovertebral tenderness  GU:  Not performed at this time. Endocrine: No evident thyromegaly. Skin:   warm, no rashes, no ecchymosis  Extremities: normal, no cyanosis, clubbing.   LABS:   LABORATORY PANEL:   CBC  Recent Labs Lab 2016-10-10 1328  WBC 3.1*  HGB 9.9*  HCT 29.7*  PLT 28*    Chemistries   Recent Labs Lab 10-10-2016 1328  NA 132*  K 2.3*  CL 96*  CO2 17*  GLUCOSE <20*  BUN 7  CREATININE 0.89  CALCIUM 7.9*  AST 84*  ALT 32  ALKPHOS 188*  BILITOT 3.2*     Recent Labs Lab 10/03/2016 1443 09/30/2016 1445  GLUCAP 30* 66   No results for input(s): PHART, PCO2ART, PO2ART in the last 168 hours.  Recent Labs Lab 09/26/2016 1328  AST 84*  ALT 32  ALKPHOS 188*  BILITOT 3.2*  ALBUMIN 1.4*    Cardiac  Enzymes  Recent Labs Lab 09/22/2016 1328  TROPONINI 0.06*    RADIOLOGY:  Ct Head Wo Contrast  Result Date: 09/16/2016 CLINICAL DATA:  Weakness and altered mental status. EXAM: CT HEAD WITHOUT CONTRAST TECHNIQUE: Contiguous axial images were obtained from the base of the skull through the vertex without intravenous contrast. COMPARISON:  09/06/2006 FINDINGS: Brain: Stable low-density along the left side of the pons is compatible with an old infarct. There is cerebral atrophy which is most prominent along the frontal lobes and this has progressed since 2008. Evidence for old infarcts involving the bilateral white matter tracts. Probable lacune infarct involving the right basal ganglia. No evidence for acute hemorrhage, mass lesion, midline shift, hydrocephalus or large new infarct. Vascular: No hyperdense vessel or unexpected calcification. Skull: Normal. Negative for fracture or focal lesion. Sinuses/Orbits: No acute finding. Other: None. IMPRESSION: Cerebral atrophy with old ischemic changes. No acute intracranial abnormality. Electronically Signed   By: Richarda Overlie M.D.   On: 10/03/2016 14:34   Dg Chest Port 1 View  Result Date: 09/21/2016 CLINICAL DATA:  Altered mental status.  Former smoker. EXAM: PORTABLE CHEST 1 VIEW COMPARISON:  07/16/2016 FINDINGS: There are low lung volumes. There is mild left basilar atelectasis. There is no focal parenchymal opacity. There is no pleural effusion or pneumothorax. The heart and mediastinal contours are unremarkable. The osseous structures are unremarkable. IMPRESSION: No active disease. Electronically Signed   By: Elige Ko   On: 09/24/2016 11:37       Tora Kindred, DO ICU Pager: [phone] [place] [facility] Office Number: [phone]  Santiago Glad, M.D.  Stephanie Acre, M.D.  Billy Fischer, M.D  10/01/2016

## 2016-10-01 NOTE — ED Notes (Signed)
Pt to ed from [facility] with increasing weakness and decreased level of consciousness x several days.  Pt skin cool to touch, skin dry.  Pt with dried blood on mouth.  Pt with wide complex noted on monitor. Pt moaning.

## 2016-10-01 NOTE — Progress Notes (Signed)
Pulmonary/critical-care on-call note  Called back again secondary to patient's family rescinding DO NOT RESUSCITATE Patient now completely unresponsive, breathing aggregately, hypotensive will need to be intubated and placed on mechanical ventilation along with central venous line placement and pressor medications.  Central venous line placement Emergently performed Indication: Patient hypotensive with inadequate prophylaxis Procedure positive performed with 2 identifiers Ultrasound guidance utilized the left internal jugular vein approach Complete contact barrier precautions utilized Under ultrasound guidance the first attempt resulted in arterial stick He apparently went through the vein towards artery Ill removed, pressure applied, no hematoma noted Second attempt resulted in successful left internal jugular vein acquisition Unfortunately guidewire would not feed even no documentation of in the vein Left internal jugular approach reported and proceeded to right femoral location Topical Hibiclens utilized Complete contact barrier precautions utilized Under ultrasound guidance the right femoral vein was accessed first pass Guidewire was placed without difficulty and needle was removed Using the 7 images technique a triple-lumen catheter was placed without difficulty Dark nonpulsatile venous return noted Guidewire removed intact All 3 ports had dark venous return and flushed without difficulty Sutured into place Dressed by nurse  Stat portable chest x-ray ordered for left internal jugular attempt  Tora Kindred, D.O.

## 2016-10-01 NOTE — ED Triage Notes (Signed)
Pt to ed via ems from [facility] with c/o weakness and altered mental status increasing over the past week.  Pt nonverbal, does respond to pain.  Pt skin cool to touch, core rectal temp 89.7.  Pt placed on cm, and bair hugger.

## 2016-10-01 NOTE — ED Notes (Signed)
Attempted to get IV on pt, unsuccessful,   Pt tolerated well.  Kathlene November Medic to bedside to attempt IV

## 2016-10-02 ENCOUNTER — Inpatient Hospital Stay: Payer: Medicare Other

## 2016-10-02 LAB — BLOOD CULTURE ID PANEL (REFLEXED)
ACINETOBACTER BAUMANNII: NOT DETECTED
CANDIDA KRUSEI: NOT DETECTED
CANDIDA PARAPSILOSIS: NOT DETECTED
Candida albicans: NOT DETECTED
Candida glabrata: NOT DETECTED
Candida tropicalis: NOT DETECTED
ESCHERICHIA COLI: NOT DETECTED
Enterobacter cloacae complex: NOT DETECTED
Enterobacteriaceae species: NOT DETECTED
Enterococcus species: NOT DETECTED
Haemophilus influenzae: NOT DETECTED
KLEBSIELLA OXYTOCA: NOT DETECTED
Klebsiella pneumoniae: NOT DETECTED
Listeria monocytogenes: NOT DETECTED
Neisseria meningitidis: NOT DETECTED
PROTEUS SPECIES: NOT DETECTED
Pseudomonas aeruginosa: NOT DETECTED
SERRATIA MARCESCENS: NOT DETECTED
STAPHYLOCOCCUS AUREUS BCID: NOT DETECTED
STAPHYLOCOCCUS SPECIES: NOT DETECTED
Streptococcus agalactiae: NOT DETECTED
Streptococcus pneumoniae: NOT DETECTED
Streptococcus pyogenes: NOT DETECTED
Streptococcus species: NOT DETECTED

## 2016-10-02 LAB — PREPARE PLATELET PHERESIS
Blood Product Expiration Date: 201802182359
ISSUE DATE / TIME: 201802161806
Unit Type and Rh: 6200

## 2016-10-02 LAB — PROTIME-INR
INR: 2.3
INR: 2.57
INR: 2.84
PROTHROMBIN TIME: 28.1 s — AB (ref 11.4–15.2)
Prothrombin Time: 25.7 seconds — ABNORMAL HIGH (ref 11.4–15.2)
Prothrombin Time: 30.4 seconds — ABNORMAL HIGH (ref 11.4–15.2)

## 2016-10-02 LAB — BLOOD GAS, ARTERIAL
ACID-BASE DEFICIT: 8.9 mmol/L — AB (ref 0.0–2.0)
ACID-BASE DEFICIT: 2.3 mmol/L — AB (ref 0.0–2.0)
Bicarbonate: 17.5 mmol/L — ABNORMAL LOW (ref 20.0–28.0)
Bicarbonate: 22.4 mmol/L (ref 20.0–28.0)
FIO2: 0.7
FIO2: 0.6
MECHVT: 450 mL
MECHVT: 450 mL
Mechanical Rate: 20
Mechanical Rate: 20
O2 SAT: 95.9 %
O2 Saturation: 98.3 %
PATIENT TEMPERATURE: 37
PCO2 ART: 37 mmHg (ref 32.0–48.0)
PEEP/CPAP: 5 cmH2O
PEEP: 5 cmH2O
PH ART: 7.26 — AB (ref 7.350–7.450)
PH ART: 7.39 (ref 7.350–7.450)
PO2 ART: 82 mmHg — AB (ref 83.0–108.0)
Patient temperature: 37
pCO2 arterial: 39 mmHg (ref 32.0–48.0)
pO2, Arterial: 124 mmHg — ABNORMAL HIGH (ref 83.0–108.0)

## 2016-10-02 LAB — PHOSPHORUS: PHOSPHORUS: 2.2 mg/dL — AB (ref 2.5–4.6)

## 2016-10-02 LAB — COMPREHENSIVE METABOLIC PANEL
ALBUMIN: 1.3 g/dL — AB (ref 3.5–5.0)
ALT: 27 U/L (ref 14–54)
ANION GAP: 14 (ref 5–15)
AST: 97 U/L — ABNORMAL HIGH (ref 15–41)
Alkaline Phosphatase: 98 U/L (ref 38–126)
BILIRUBIN TOTAL: 1.4 mg/dL — AB (ref 0.3–1.2)
BUN: 7 mg/dL (ref 6–20)
CO2: 20 mmol/L — ABNORMAL LOW (ref 22–32)
Calcium: 6.1 mg/dL — CL (ref 8.9–10.3)
Chloride: 101 mmol/L (ref 101–111)
Creatinine, Ser: 1.56 mg/dL — ABNORMAL HIGH (ref 0.44–1.00)
GFR calc Af Amer: 40 mL/min — ABNORMAL LOW (ref >60)
GFR, EST NON AFRICAN AMERICAN: 35 mL/min — AB (ref >60)
GLUCOSE: 79 mg/dL (ref 65–99)
POTASSIUM: 3.3 mmol/L — AB (ref 3.5–5.1)
Sodium: 135 mmol/L (ref 135–145)
TOTAL PROTEIN: 3.9 g/dL — AB (ref 6.5–8.1)

## 2016-10-02 LAB — CBC
HCT: 22.6 % — ABNORMAL LOW (ref 35.0–47.0)
HEMATOCRIT: 24.1 % — AB (ref 35.0–47.0)
Hemoglobin: 7.6 g/dL — ABNORMAL LOW (ref 12.0–16.0)
Hemoglobin: 8 g/dL — ABNORMAL LOW (ref 12.0–16.0)
MCH: 29.7 pg (ref 26.0–34.0)
MCH: 29.8 pg (ref 26.0–34.0)
MCHC: 33.5 g/dL (ref 32.0–36.0)
MCHC: 33 g/dL (ref 32.0–36.0)
MCV: 88.7 fL (ref 80.0–100.0)
MCV: 90.4 fL (ref 80.0–100.0)
Platelets: 91 10*3/uL — ABNORMAL LOW (ref 150–440)
Platelets: 46 10*3/uL — ABNORMAL LOW (ref 150–440)
RBC: 2.55 MIL/uL — AB (ref 3.80–5.20)
RBC: 2.67 MIL/uL — ABNORMAL LOW (ref 3.80–5.20)
RDW: 15.4 % — ABNORMAL HIGH (ref 11.5–14.5)
RDW: 16.1 % — AB (ref 11.5–14.5)
WBC: 7.9 10*3/uL (ref 3.6–11.0)
WBC: 16.4 10*3/uL — ABNORMAL HIGH (ref 3.6–11.0)

## 2016-10-02 LAB — LACTIC ACID, PLASMA
LACTIC ACID, VENOUS: 11 mmol/L — AB (ref 0.5–1.9)
Lactic Acid, Venous: 11.2 mmol/L (ref 0.5–1.9)
Lactic Acid, Venous: 10.2 mmol/L (ref 0.5–1.9)
Lactic Acid, Venous: 9.4 mmol/L (ref 0.5–1.9)
Lactic Acid, Venous: 10 mmol/L (ref 0.5–1.9)

## 2016-10-02 LAB — BASIC METABOLIC PANEL
ANION GAP: 14 (ref 5–15)
BUN: 6 mg/dL (ref 6–20)
CALCIUM: 6.4 mg/dL — AB (ref 8.9–10.3)
CO2: 23 mmol/L (ref 22–32)
Chloride: 101 mmol/L (ref 101–111)
Creatinine, Ser: 1.19 mg/dL — ABNORMAL HIGH (ref 0.44–1.00)
GFR calc Af Amer: 56 mL/min — ABNORMAL LOW (ref >60)
GFR, EST NON AFRICAN AMERICAN: 48 mL/min — AB (ref >60)
GLUCOSE: 68 mg/dL (ref 65–99)
Potassium: 2.1 mmol/L — CL (ref 3.5–5.1)
SODIUM: 138 mmol/L (ref 135–145)

## 2016-10-02 LAB — GLUCOSE, CAPILLARY
GLUCOSE-CAPILLARY: 101 mg/dL — AB (ref 65–99)
GLUCOSE-CAPILLARY: 69 mg/dL (ref 65–99)
GLUCOSE-CAPILLARY: 66 mg/dL (ref 65–99)
GLUCOSE-CAPILLARY: 53 mg/dL — AB (ref 65–99)
GLUCOSE-CAPILLARY: 82 mg/dL (ref 65–99)
GLUCOSE-CAPILLARY: 119 mg/dL — AB (ref 65–99)
GLUCOSE-CAPILLARY: 66 mg/dL (ref 65–99)
GLUCOSE-CAPILLARY: 104 mg/dL — AB (ref 65–99)
GLUCOSE-CAPILLARY: 71 mg/dL (ref 65–99)
Glucose-Capillary: 103 mg/dL — ABNORMAL HIGH (ref 65–99)
Glucose-Capillary: 119 mg/dL — ABNORMAL HIGH (ref 65–99)
Glucose-Capillary: 143 mg/dL — ABNORMAL HIGH (ref 65–99)
Glucose-Capillary: 155 mg/dL — ABNORMAL HIGH (ref 65–99)
Glucose-Capillary: 182 mg/dL — ABNORMAL HIGH (ref 65–99)
Glucose-Capillary: 196 mg/dL — ABNORMAL HIGH (ref 65–99)
Glucose-Capillary: 39 mg/dL — CL (ref 65–99)
Glucose-Capillary: 58 mg/dL — ABNORMAL LOW (ref 65–99)
Glucose-Capillary: 64 mg/dL — ABNORMAL LOW (ref 65–99)
Glucose-Capillary: 65 mg/dL (ref 65–99)
Glucose-Capillary: 68 mg/dL (ref 65–99)
Glucose-Capillary: 70 mg/dL (ref 65–99)
Glucose-Capillary: 81 mg/dL (ref 65–99)
Glucose-Capillary: 84 mg/dL (ref 65–99)
Glucose-Capillary: 89 mg/dL (ref 65–99)

## 2016-10-02 LAB — HIV ANTIBODY (ROUTINE TESTING W REFLEX): HIV Screen 4th Generation wRfx: NONREACTIVE

## 2016-10-02 LAB — PREPARE FRESH FROZEN PLASMA: Unit division: 0

## 2016-10-02 LAB — TROPONIN I: TROPONIN I: 0.07 ng/mL — AB (ref <0.03)

## 2016-10-02 LAB — FIBRINOGEN: FIBRINOGEN: 139 mg/dL — AB (ref 210–475)

## 2016-10-02 LAB — THYROID PANEL WITH TSH
FREE THYROXINE INDEX: 1.9 (ref 1.2–4.9)
T3 UPTAKE RATIO: 39 % (ref 24–39)
T4 TOTAL: 4.9 ug/dL (ref 4.5–12.0)
TSH: 0.428 u[IU]/mL — AB (ref 0.450–4.500)

## 2016-10-02 LAB — URINE CULTURE

## 2016-10-02 LAB — POTASSIUM: Potassium: 2.8 mmol/L — ABNORMAL LOW (ref 3.5–5.1)

## 2016-10-02 LAB — VANCOMYCIN, TROUGH
VANCOMYCIN TR: 52 ug/mL — AB (ref 15–20)
Vancomycin Tr: 31 ug/mL (ref 15–20)

## 2016-10-02 LAB — APTT: aPTT: 72 seconds — ABNORMAL HIGH (ref 24–36)

## 2016-10-02 LAB — MAGNESIUM: MAGNESIUM: 1.7 mg/dL (ref 1.7–2.4)

## 2016-10-02 MED ORDER — ACETAMINOPHEN 325 MG PO TABS: 650.0000 mg | ORAL_TABLET | Freq: Four times a day (QID) | ORAL | Status: DC | PRN

## 2016-10-02 MED ORDER — PHENYLEPHRINE HCL 10 MG/ML IJ SOLN
0.0000 ug/min | INTRAMUSCULAR | Status: DC
  Administered 2016-10-02: 200 ug/min via INTRAVENOUS
  Administered 2016-10-02: 300 ug/min via INTRAVENOUS
  Administered 2016-10-02: 400 ug/min via INTRAVENOUS
  Administered 2016-10-02: 320 ug/min via INTRAVENOUS
  Administered 2016-10-02: 50 ug/min via INTRAVENOUS
  Administered 2016-10-02: 360 ug/min via INTRAVENOUS
  Filled 2016-10-02 (×9): qty 4

## 2016-10-02 MED ORDER — DEXTROSE 50 % IV SOLN
1.0000 | Freq: Once | INTRAVENOUS | Status: AC
Start: 2016-10-02 — End: 2016-10-02
  Administered 2016-10-02: 50 mL via INTRAVENOUS

## 2016-10-02 MED ORDER — CALCIUM GLUCONATE 10 % IV SOLN: 1.0000 g | Freq: Once | INTRAVENOUS | Status: DC

## 2016-10-02 MED ORDER — DEXTROSE 50 % IV SOLN
INTRAVENOUS | Status: AC
  Administered 2016-10-02: 50 mL via INTRAVENOUS
  Filled 2016-10-02: qty 50

## 2016-10-02 MED ORDER — GLYCOPYRROLATE 1 MG PO TABS: 1.0000 mg | ORAL_TABLET | ORAL | Status: DC | PRN

## 2016-10-02 MED ORDER — POTASSIUM CHLORIDE 2 MEQ/ML IV SOLN
Freq: Once | INTRAVENOUS | Status: DC
  Filled 2016-10-02: qty 1000

## 2016-10-02 MED ORDER — ORAL CARE MOUTH RINSE
15.0000 mL | Freq: Four times a day (QID) | OROMUCOSAL | Status: DC
  Administered 2016-10-02 (×3): 15 mL via OROMUCOSAL

## 2016-10-02 MED ORDER — DEXTROSE 50 % IV SOLN
25.0000 mL | Freq: Once | INTRAVENOUS | Status: AC
  Administered 2016-10-02: 25 mL via INTRAVENOUS
  Filled 2016-10-02: qty 50

## 2016-10-02 MED ORDER — SODIUM CHLORIDE 0.9 % IV SOLN: 30.0000 meq | Freq: Once | INTRAVENOUS | Status: DC

## 2016-10-02 MED ORDER — GLYCOPYRROLATE 0.2 MG/ML IJ SOLN: 0.2000 mg | INTRAMUSCULAR | Status: DC | PRN

## 2016-10-02 MED ORDER — DEXTROSE 50 % IV SOLN
1.0000 | Freq: Once | INTRAVENOUS | Status: AC
  Administered 2016-10-02: 50 mL via INTRAVENOUS

## 2016-10-02 MED ORDER — CHLORHEXIDINE GLUCONATE 0.12% ORAL RINSE (MEDLINE KIT)
15.0000 mL | Freq: Two times a day (BID) | OROMUCOSAL | Status: DC
Start: 2016-10-02 — End: 2016-10-02
  Administered 2016-10-02 (×2): 15 mL via OROMUCOSAL

## 2016-10-02 MED ORDER — SODIUM CHLORIDE 0.9 % IV SOLN
1.0000 g | Freq: Once | INTRAVENOUS | Status: DC
  Filled 2016-10-02 (×2): qty 10

## 2016-10-02 MED ORDER — SODIUM BICARBONATE 8.4 % IV SOLN
INTRAVENOUS | Status: DC
  Administered 2016-10-02: 05:00:00 via INTRAVENOUS
  Filled 2016-10-02 (×5): qty 150

## 2016-10-02 MED ORDER — DOPAMINE-DEXTROSE 3.2-5 MG/ML-% IV SOLN
0.0000 ug/kg/min | INTRAVENOUS | Status: DC
  Administered 2016-10-02: 20 ug/kg/min via INTRAVENOUS
  Administered 2016-10-02: 10 ug/kg/min via INTRAVENOUS

## 2016-10-02 MED ORDER — DOPAMINE-DEXTROSE 3.2-5 MG/ML-% IV SOLN
INTRAVENOUS | Status: AC
  Administered 2016-10-02: 20 ug/kg/min via INTRAVENOUS
  Filled 2016-10-02: qty 250

## 2016-10-02 MED ORDER — SODIUM BICARBONATE 8.4 % IV SOLN
150.0000 meq | Freq: Once | INTRAVENOUS | Status: AC
  Administered 2016-10-02: 150 meq via INTRAVENOUS
  Filled 2016-10-02: qty 150

## 2016-10-02 MED ORDER — MAGNESIUM SULFATE 2 GM/50ML IV SOLN: 2.0000 g | Freq: Once | INTRAVENOUS | Status: DC

## 2016-10-02 MED ORDER — POTASSIUM CHLORIDE 20 MEQ/15ML (10%) PO SOLN: 40.0000 meq | Freq: Once | ORAL | Status: DC

## 2016-10-02 MED ORDER — STERILE WATER FOR INJECTION IV SOLN
INTRAVENOUS | Status: DC
  Administered 2016-10-02: 20:00:00 via INTRAVENOUS
  Filled 2016-10-02 (×2): qty 850

## 2016-10-02 MED ORDER — MORPHINE SULFATE (PF) 4 MG/ML IV SOLN: 1.0000 mg | INTRAVENOUS | Status: DC | PRN

## 2016-10-02 MED ORDER — BIOTENE DRY MOUTH MT LIQD: 15.0000 mL | OROMUCOSAL | Status: DC | PRN

## 2016-10-02 MED ORDER — POTASSIUM PHOSPHATES 15 MMOLE/5ML IV SOLN
20.0000 mmol | Freq: Once | INTRAVENOUS | Status: AC
  Administered 2016-10-02: 20 mmol via INTRAVENOUS
  Filled 2016-10-02: qty 6.67

## 2016-10-02 MED ORDER — ACETAMINOPHEN 650 MG RE SUPP: 650.0000 mg | Freq: Four times a day (QID) | RECTAL | Status: DC | PRN

## 2016-10-02 MED ORDER — DEXTROSE 50 % IV SOLN
INTRAVENOUS | Status: AC
  Filled 2016-10-02: qty 50

## 2016-10-02 MED ORDER — DEXTROSE 50 % IV SOLN
25.0000 mL | Freq: Once | INTRAVENOUS | Status: AC
  Administered 2016-10-02: 25 mL via INTRAVENOUS

## 2016-10-02 MED ORDER — VANCOMYCIN HCL IN DEXTROSE 750-5 MG/150ML-% IV SOLN: 750.0000 mg | INTRAVENOUS | Status: DC

## 2016-10-02 MED ORDER — MAGNESIUM SULFATE 2 GM/50ML IV SOLN
2.0000 g | Freq: Once | INTRAVENOUS | Status: AC
  Administered 2016-10-02: 2 g via INTRAVENOUS
  Filled 2016-10-02: qty 50

## 2016-10-02 MED ORDER — POLYVINYL ALCOHOL 1.4 % OP SOLN
1.0000 [drp] | Freq: Four times a day (QID) | OPHTHALMIC | Status: DC | PRN
  Filled 2016-10-02: qty 15

## 2016-10-02 MED ORDER — POTASSIUM CHLORIDE 2 MEQ/ML IV SOLN
INTRAVENOUS | Status: DC
  Administered 2016-10-02 (×2): via INTRAVENOUS
  Filled 2016-10-02 (×4): qty 1000

## 2016-10-03 DIAGNOSIS — R4182 Altered mental status, unspecified: Secondary | ICD-10-CM

## 2016-10-03 DIAGNOSIS — A419 Sepsis, unspecified organism: Secondary | ICD-10-CM

## 2016-10-03 DIAGNOSIS — R6521 Severe sepsis with septic shock: Secondary | ICD-10-CM

## 2016-10-03 LAB — PREPARE PLATELET PHERESIS
Blood Product Expiration Date: 201802192359
ISSUE DATE / TIME: 201802170243
Unit Type and Rh: 5100

## 2016-10-04 LAB — CULTURE, BLOOD (ROUTINE X 2)

## 2016-10-04 LAB — GLUCOSE, CAPILLARY
GLUCOSE-CAPILLARY: 79 mg/dL (ref 65–99)
Glucose-Capillary: 17 mg/dL — CL (ref 65–99)
Glucose-Capillary: 125 mg/dL — ABNORMAL HIGH (ref 65–99)

## 2016-10-06 LAB — CULTURE, BLOOD (ROUTINE X 2): CULTURE: NO GROWTH

## 2016-10-14 NOTE — Progress Notes (Signed)
PHARMACY - PHYSICIAN COMMUNICATION CRITICAL VALUE ALERT - BLOOD CULTURE IDENTIFICATION (BCID)  Results for orders placed or performed during the hospital encounter of 09/16/2016  Blood Culture ID Panel (Reflexed) (Collected: 09/18/2016 11:45 AM)  Result Value Ref Range   Enterococcus species NOT DETECTED NOT DETECTED   Listeria monocytogenes NOT DETECTED NOT DETECTED   Staphylococcus species NOT DETECTED NOT DETECTED   Staphylococcus aureus NOT DETECTED NOT DETECTED   Streptococcus species NOT DETECTED NOT DETECTED   Streptococcus agalactiae NOT DETECTED NOT DETECTED   Streptococcus pneumoniae NOT DETECTED NOT DETECTED   Streptococcus pyogenes NOT DETECTED NOT DETECTED   Acinetobacter baumannii NOT DETECTED NOT DETECTED   Enterobacteriaceae species NOT DETECTED NOT DETECTED   Enterobacter cloacae complex NOT DETECTED NOT DETECTED   Escherichia coli NOT DETECTED NOT DETECTED   Klebsiella oxytoca NOT DETECTED NOT DETECTED   Klebsiella pneumoniae NOT DETECTED NOT DETECTED   Proteus species NOT DETECTED NOT DETECTED   Serratia marcescens NOT DETECTED NOT DETECTED   Haemophilus influenzae NOT DETECTED NOT DETECTED   Neisseria meningitidis NOT DETECTED NOT DETECTED   Pseudomonas aeruginosa NOT DETECTED NOT DETECTED   Candida albicans NOT DETECTED NOT DETECTED   Candida glabrata NOT DETECTED NOT DETECTED   Candida krusei NOT DETECTED NOT DETECTED   Candida parapsilosis NOT DETECTED NOT DETECTED   Candida tropicalis NOT DETECTED NOT DETECTED    Name of physician (or Provider) Contacted: Brendolyn Patty NP  Changes to prescribed antibiotics required: None at this time  Carola Frost, Pharm.D., BCPS Clinical Pharmacist 2016/10/04  3:58 AM

## 2016-10-14 NOTE — Progress Notes (Signed)
Pt maxed on pressors with MAP in 40's. Dopamine ordered and 3 amps bicarb ordered per NP Tukov. Lactic acid remains critically elevated at 11. Luci Bank had a meeting with daughter and decision was made to terminally extubate.

## 2016-10-14 NOTE — Progress Notes (Addendum)
Lab reports vancomycin trough 52 mcg/mL. Spoke with RN who says vancomycin was administered at 0522, level collected at 0540 (transposition error, recorded collection is 0450). Result is not a true trough. Will reorder trough for before dose this evening. This will be before the fourth dose and not at steady state but should be drawn to confirm level is not truly supratherapeutic.   Alvah Gilder A. [employer], [place].D., BCPS Clinical Pharmacist 10/04/2016 [phone]

## 2016-10-14 NOTE — Progress Notes (Signed)
ICU medical Dr aware of patient pressor requirement. He is also aware of patients increasing lactic acid results. Patient has not had any urine output. Patients family will be in about an hour per daughter to speak with MD.

## 2016-10-14 NOTE — Progress Notes (Signed)
Pt terminally extubated per NP Tukov. Family at bedside and supportive. Pt resting comfortably on 50 mcg Fentanyl at this time per NP Tukov.

## 2016-10-14 NOTE — Progress Notes (Addendum)
PHARMACY  CONSULT NOTE - INITIAL   Pharmacy Consult for Electrolyte management   Patient Measurements: Height: 5\' 3"  (160 cm) Weight: 177 lb 11.1 oz (80.6 kg) IBW/kg (Calculated) : 52.4  Vital Signs: Temp: 99 F (37.2 C) (02/17 0630) Temp Source: Core (Comment) (02/17 0400) BP: 82/47 (02/17 0630) Pulse Rate: 111 (02/17 0630)   Recent Labs  October 27, 2016 1327  10-27-16 1328 10/27/2016 1848 2016-10-27 2100 09/24/2016 0450  WBC  --   < > 3.1* 3.5* 4.6 7.9  HGB  --   < > 9.9* 8.8* 9.5* 7.6*  HCT  --   < > 29.7* 26.1* 28.9* 22.6*  PLT  --   < > 28* 57* 55* 91*  APTT 71*  --   --   --   --   --   CREATININE  --   < > 0.89 0.93 1.02* 1.19*  LABCREA  --   --  53  --   --   --   MG  --   --  1.0*  --  2.4 1.7  PHOS  --   --   --   --  4.2 2.2*  ALBUMIN  --   --  1.4*  --   --   --   PROT  --   --  4.4*  --   --   --   AST  --   --  84*  --   --   --   ALT  --   --  32  --   --   --   ALKPHOS  --   --  188*  --   --   --   BILITOT  --   --  3.2*  --   --   --   < > = values in this interval not displayed. Lab Results  Component Value Date   K 2.1 (LL) 10/04/2016    Estimated Creatinine Clearance: 49.9 mL/min (by C-G formula based on SCr of 1.19 mg/dL (H)).   Microbiology: Recent Results (from the past 720 hour(s))  Blood Culture (routine x 2)     Status: None (Preliminary result)   Collection Time: 10/27/2016 11:45 AM  Result Value Ref Range Status   Specimen Description BLOOD LEFT AC  Final   Special Requests BOTTLES DRAWN AEROBIC AND ANAEROBIC  BCHV  Final   Culture  Setup Time   Final    Organism ID to follow GRAM NEGATIVE RODS IN BOTH AEROBIC AND ANAEROBIC BOTTLES CRITICAL RESULT CALLED TO, READ BACK BY AND VERIFIED WITH: NATE Thaddeaus Monica AT 10/03/16 09/21/2016.PMH    Culture GRAM NEGATIVE RODS  Final   Report Status PENDING  Incomplete  Blood Culture ID Panel (Reflexed)     Status: None   Collection Time: 10-27-16 11:45 AM  Result Value Ref Range Status   Enterococcus species NOT  DETECTED NOT DETECTED Final   Listeria monocytogenes NOT DETECTED NOT DETECTED Final   Staphylococcus species NOT DETECTED NOT DETECTED Final   Staphylococcus aureus NOT DETECTED NOT DETECTED Final   Streptococcus species NOT DETECTED NOT DETECTED Final   Streptococcus agalactiae NOT DETECTED NOT DETECTED Final   Streptococcus pneumoniae NOT DETECTED NOT DETECTED Final   Streptococcus pyogenes NOT DETECTED NOT DETECTED Final   Acinetobacter baumannii NOT DETECTED NOT DETECTED Final   Enterobacteriaceae species NOT DETECTED NOT DETECTED Final   Enterobacter cloacae complex NOT DETECTED NOT DETECTED Final   Escherichia coli NOT DETECTED NOT DETECTED Final  Klebsiella oxytoca NOT DETECTED NOT DETECTED Final   Klebsiella pneumoniae NOT DETECTED NOT DETECTED Final   Proteus species NOT DETECTED NOT DETECTED Final   Serratia marcescens NOT DETECTED NOT DETECTED Final   Haemophilus influenzae NOT DETECTED NOT DETECTED Final   Neisseria meningitidis NOT DETECTED NOT DETECTED Final   Pseudomonas aeruginosa NOT DETECTED NOT DETECTED Final   Candida albicans NOT DETECTED NOT DETECTED Final   Candida glabrata NOT DETECTED NOT DETECTED Final   Candida krusei NOT DETECTED NOT DETECTED Final   Candida parapsilosis NOT DETECTED NOT DETECTED Final   Candida tropicalis NOT DETECTED NOT DETECTED Final  MRSA PCR Screening     Status: None   Collection Time: 21-Oct-2016 10:20 PM  Result Value Ref Range Status   MRSA by PCR NEGATIVE NEGATIVE Final    Comment:        The GeneXpert MRSA Assay (FDA approved for NASAL specimens only), is one component of a comprehensive MRSA colonization surveillance program. It is not intended to diagnose MRSA infection nor to guide or monitor treatment for MRSA infections.     Medical History: Past Medical History:  Diagnosis Date  . Collagen vascular disease (HCC)   . COPD (chronic obstructive pulmonary disease) (HCC)   . ILD (interstitial lung disease) (HCC)    . Lupus   . Stroke [facility])      Assessment: 61 yo female admitted to CCU with sepsis and hypokalemia. Pharmacy consulted for electrolyte management.   2/16:   K+: 2.3   Mg: 1.0  Plan:  KCL 30 mEq/23mL ordered in ED. Infusion will end at 2015 this evening.   Will order Magnesium 4gm Now. Will also  order  KCL 23mEq/250mL  X 2 bags to start at  2100 tonight. F/U labs ordered with AM labs. .  2/17 0450 K 2.1, Ca 6.4, adjusted Ca 8.5, Phos 2.2, Mg 1.7. Patient received supplements as above. Will continue to supplement this AM.   1. Potassium chloride 80 mEq/1000 mL over 8 hours   2. Magnesium sulfate 2 gm IV x 1   3. Potassium phosphate 20 mmol IV x 1   4. Recheck BMP this evening   5. Recheck all electrolytes tomorrow with AM labs  Carola Frost, PharmD, BCPS Clinical Pharmacist 09/16/2016 6:59 AM

## 2016-10-14 NOTE — Progress Notes (Signed)
eLink Physician-Brief Progress Note Patient Name: DEMIA VIERA DOB: 1956-04-03 MRN: [id number]   Date of Service  10-16-16  HPI/Events of Note  Low K, Mg, Ca  eICU Interventions  K, mg, Ca repleted. Check K at 11 am.      Intervention Category Intermediate Interventions: Electrolyte abnormality - evaluation and management  Louann Sjogren 10-16-2016, 6:58 AM

## 2016-10-14 NOTE — Progress Notes (Signed)
Pt HR 0 on monitor. NP Luci Bank went into room to pronounce.

## 2016-10-14 NOTE — Progress Notes (Signed)
[facility] Critical Care Medicine Progess Note    ASSESSMENT/PLAN   Septic shock: Patient with gram-negative rods in blood rate is on vasopressin, norepinephrine and Neo-Synephrine along with broad-spectrum antibiotic coverage to include vancomycin and Zosyn. Has received volume resuscitation, lactic acid has been increasing to 11. Will consider adding dobutamine.  Hypokalemia: We'll replace and place on a I replacement protocol And repeat potassium post infusion  Anemia/ Thrombocytopenia: Most likely sepsis with marrow suppression.   Coagulopathy: Secondary to sepsis/DIC, Patient has received fresh frozen plasma, platelet transfusion along with transfusion of packed red blood cells last p.m.  Hypoglycemia: We'll place on D10 infusion  Acid-based derangement: pH is significantly improved, bicarbonate infusion has been stopped  Positive troponin: Most likely reflects demand ischemia will repeat EKG and enzymes this morning  Thrombocytopenia: Sepsis-induced marrow suppression possible, also on multiple pharmacologic agents which may cause thrombocytopenia Plaquenil being held  Disposition: Family has made patient DO NOT RESUSCITATE, will discuss with them goals of care when they arrived this morning  Name: [name] MRN: [id number] DOB: 11/10/1955    ADMISSION DATE:  09/25/2016  Last 24 Hours:  Patient had difficulty evening. Developed progressive respiratory failure requiring intubation. Overwhelming sepsis hypotension, central line placed now with multisystem organ failure requiring vasopressin, norepinephrine and Neo-Synephrine. Has been made a DO NOT RESUSCITATE by family. Family coming in to discuss further goals of care   VITAL SIGNS: Temp:  [89.7 F (32.1 C)-99.1 F (37.3 C)] 99 F (37.2 C) (02/17 0900) Pulse Rate:  [73-112] 108 (02/17 0900) Resp:  [15-30] 20 (02/17 0900) BP: (44-133)/(26-96) 76/55 (02/17 0900) SpO2:  [72 %-100 %] 100 % (02/17 0900) FiO2 (%):   [60 %-100 %] 60 % (02/17 0805) Weight:  [78.5 kg (173 lb)-80.6 kg (177 lb 11.1 oz)] 80.6 kg (177 lb 11.1 oz) (02/16 1730) HEMODYNAMICS:   VENTILATOR SETTINGS: Vent Mode: PRVC FiO2 (%):  [60 %-100 %] 60 % Set Rate:  [20 bmp] 20 bmp Vt Set:  [450 mL] 450 mL PEEP:  [5 cmH20] 5 cmH20 Plateau Pressure:  [28 cmH20] 28 cmH20 INTAKE / OUTPUT:  Intake/Output Summary (Last 24 hours) at 2016-10-04 0929 Last data filed at 10/04/16 4098  Gross per 24 hour  Intake          5562.05 ml  Output              450 ml  Net          5112.05 ml    PHYSICAL EXAMINATION: Physical Examination:   VS: BP (!) 76/55   Pulse (!) 108   Temp 99 F (37.2 C)   Resp 20   Ht 5\' 3"  (1.6 m)   Wt 80.6 kg (177 lb 11.1 oz)   SpO2 100%   BMI 31.48 kg/m   General Appearance: Chronically ill-appearing  female  Neuro: Patient is encephalopathic, sedated and unresponsive HEENT: PERRLA, EOM intact. Pulmonary: Coarse rhonchi appreciated bilaterally CardiovascularNormal S1,S2.  No m/r/g.   Abdomen: Benign, Soft, non-tender. Renal:  No costovertebral tenderness  Extremities: no cyanosis, clubbing. Lower extremity edema chronically noted   LABS:   LABORATORY PANEL:   CBC  Recent Labs Lab 10-04-16 0450  WBC 7.9  HGB 7.6*  HCT 22.6*  PLT 91*    Chemistries   Recent Labs Lab 09/19/2016 1328  2016-10-04 0450  NA 132*  < > 138  K 2.3*  < > 2.1*  CL 96*  < > 101  CO2 17*  < > 23  GLUCOSE <20*  < > 68  BUN 7  < > 6  CREATININE 0.89  < > 1.19*  CALCIUM 7.9*  < > 6.4*  MG 1.0*  < > 1.7  PHOS  --   < > 2.2*  AST 84*  --   --   ALT 32  --   --   ALKPHOS 188*  --   --   BILITOT 3.2*  --   --   < > = values in this interval not displayed.   Recent Labs Lab 09/27/2016 0544 09/22/2016 0621 09/29/2016 0653 09/28/2016 0720 09/21/2016 0816 09/25/2016 0916  GLUCAP 70 66 155* 103* 53* 82    Recent Labs Lab October 19, 2016 2215 10/06/2016 0108 09/23/2016 0600  PHART 7.15* 7.26* 7.39  PCO2ART 40 39 37  PO2ART 240*  124* 82*    Recent Labs Lab 10/19/2016 1328  AST 84*  ALT 32  ALKPHOS 188*  BILITOT 3.2*  ALBUMIN 1.4*    Cardiac Enzymes  Recent Labs Lab 09/30/2016 0450  TROPONINI 0.07*    RADIOLOGY:  Dg Abd 1 View  Result Date: 10-19-2016 CLINICAL DATA:  Orogastric tube placement.  Initial encounter. EXAM: ABDOMEN - 1 VIEW COMPARISON:  CT of the abdomen and pelvis performed earlier today at 4:16 p.m. FINDINGS: The patient's enteric tube is noted coiled overlying the body of the stomach. The visualized bowel gas pattern is grossly unremarkable; the known small and large bowel abnormalities are not well characterized on radiograph. No free intra-abdominal air is seen, though evaluation for free air is limited on a single supine view. Mild degenerative change is noted at the lower lumbar spine. Contrast is noted within the bladder, with an associated Foley catheter. Soft tissue calcifications are again noted at the level of the buttocks. IMPRESSION: Enteric tube noted coiled overlying the body of the stomach. Electronically Signed   By: Roanna Raider M.D.   On: 19-Oct-2016 23:39   Ct Head Wo Contrast  Result Date: 2016/10/19 CLINICAL DATA:  Weakness and altered mental status. EXAM: CT HEAD WITHOUT CONTRAST TECHNIQUE: Contiguous axial images were obtained from the base of the skull through the vertex without intravenous contrast. COMPARISON:  09/06/2006 FINDINGS: Brain: Stable low-density along the left side of the pons is compatible with an old infarct. There is cerebral atrophy which is most prominent along the frontal lobes and this has progressed since 2008. Evidence for old infarcts involving the bilateral white matter tracts. Probable lacune infarct involving the right basal ganglia. No evidence for acute hemorrhage, mass lesion, midline shift, hydrocephalus or large new infarct. Vascular: No hyperdense vessel or unexpected calcification. Skull: Normal. Negative for fracture or focal lesion.  Sinuses/Orbits: No acute finding. Other: None. IMPRESSION: Cerebral atrophy with old ischemic changes. No acute intracranial abnormality. Electronically Signed   By: Richarda Overlie M.D.   On: 10-19-2016 14:34   Ct Abdomen Pelvis W Contrast  Result Date: 2016-10-19 CLINICAL DATA:  Diffuse abdominal pain with weakness and altered mental status over the last week. Hypothermia. History of systemic lupus erythematosus, interstitial lung disease, collagen vascular disease and stroke. EXAM: CT ABDOMEN AND PELVIS WITH CONTRAST TECHNIQUE: Multidetector CT imaging of the abdomen and pelvis was performed using the standard protocol following bolus administration of intravenous contrast. CONTRAST:  ISOVUE-300 IOPAMIDOL (ISOVUE-300) INJECTION 61% COMPARISON:  Ultrasound 05/26/2016.  Abdominopelvic CT 09/04/2006. FINDINGS: Lower chest: There are new small dependent pleural effusions, left greater than right. There is associated lower lobe dependent atelectasis, right-greater-than-left. There is no pericardial  effusion. There is mild atherosclerosis of the aorta and coronary arteries. Hepatobiliary: The liver demonstrates mild contour irregularity and diffuse heterogeneity of its parenchyma. There is a 1.8 cm low-density lesion anteriorly in the dome of the right lobe which is probably a cyst based on stability from the prior CT. However, there are innumerable smaller low-density lesions throughout the liver which appear new. No definite hypervascular lesions are seen. No significant biliary dilatation post cholecystectomy. Pancreas: The pancreatic head appears mildly edematous. No focal mass lesion or pancreatic ductal dilatation is seen. The pancreatic body and tail appear normal. Spleen: Normal in size without focal abnormality. Adrenals/Urinary Tract: Both adrenal glands appear normal. There is a small nonobstructing calculus in the mid right kidney. No other urinary tract calculi are seen. There is no hydronephrosis or  focal renal abnormality. The bladder is decompressed by a Foley catheter. Stomach/Bowel: There is diffuse small and large bowel wall thickening, likely related to the patient's liver disease. No focal mucosal lesion or significant distention identified. Vascular/Lymphatic: There are no enlarged abdominal or pelvic lymph nodes. No acute vascular findings are seen. There is mild aortic and branch vessel atherosclerosis. The portal, superior mesenteric and splenic veins are patent. No varices are evident. Reproductive: Hysterectomy.  No evidence of adnexal mass. Other: There is generalized mesenteric edema with a small to moderate amount of ascites, greatest in the pelvis. Musculoskeletal: There is generalized subcutaneous edema, greatest within the flanks. Multiple subcutaneous calcifications are present within the buttocks, progressive from previous study. Interval development of compression deformities at T10 and L4. The T10 fracture results in near complete vertebra plana and is not significantly changed from chest radiographs of 07/16/2016. No definite acute findings. IMPRESSION: 1. Diffuse hepatic abnormality may be secondary to cirrhosis with multiple regenerating nodules. Multifocal hepatocellular carcinoma cannot be excluded. Correlation with serum alpha-fetoprotein levels and consideration of further evaluation with abdominal are MRI recommended. 2. Anasarca with bilateral pleural effusions, ascites and diffuse soft tissue edema. 3. Generalized small bowel and colonic wall thickening, likely related to liver disease. 4. Atherosclerosis without evidence of large vessel occlusion. 5. Nonacute appearing compression deformities at T10 and L4. Electronically Signed   By: Carey Bullocks M.D.   On: 09/16/2016 16:52   Dg Chest Port 1 View  Result Date: 10/04/2016 CLINICAL DATA:  Post intubation. EXAM: PORTABLE CHEST 1 VIEW COMPARISON:  10/06/2016 and 07/16/2016. FINDINGS: Two views are submitted at 1852 hours.  On both views, the patient is rotated to the right. Endotracheal tube has been placed, tip approximately 13 mm above the carina. The carina is not optimally visualized. The heart size and mediastinal contours are stable. There is chronic elevation of the right hemidiaphragm with interval increased opacity at both lung bases. No pneumothorax or large pleural effusions seen. IMPRESSION: 1. Endotracheal tube tip is low, approximately 1.3 cm above the carina. 2. Interval increased bibasilar airspace opacities suspicious for possible aspiration. Electronically Signed   By: Carey Bullocks M.D.   On: 09/24/2016 19:22   Dg Chest Port 1 View  Result Date: 10/05/2016 CLINICAL DATA:  Altered mental status.  Former smoker. EXAM: PORTABLE CHEST 1 VIEW COMPARISON:  07/16/2016 FINDINGS: There are low lung volumes. There is mild left basilar atelectasis. There is no focal parenchymal opacity. There is no pleural effusion or pneumothorax. The heart and mediastinal contours are unremarkable. The osseous structures are unremarkable. IMPRESSION: No active disease. Electronically Signed   By: Elige Ko   On: 10/05/2016 11:37  Tora Kindred, DO ICU Pager: [phone] [place] [facility] Office Number: [phone]  Santiago Glad, M.D.  Stephanie Acre, M.D.  Billy Fischer, M.D  09/27/2016

## 2016-10-14 NOTE — Progress Notes (Signed)
Spoke with Dr Lonn Georgia regarding patient urine output, pressors and blood sugars. Patients family has not communicated a decision as of yet. No new orders from Dr Lonn Georgia.

## 2016-10-14 NOTE — Progress Notes (Signed)
Pharmacy Antibiotic Note  [name] is a 61 y.o. female admitted on 2016-10-15 with sepsis.  Pharmacy has been consulted for Zosyn and Vancomycin dosing.  2/17 1730 Vancomycin trough resulted at 10mcg/ml. Patient with decreased urine output and worsening renal function.  Plan: Will decrease to Vancomycin 750mg  IV q24h. Renal function may continue to decline so will check a trough prior to next dose to ensure clearance. Goal trough is 15-69mcg/ml.  Will continue Zosyn 3.375 IV EI every 8 hours.   Height: 5\' 3"  (160 cm) Weight: 177 lb 11.1 oz (80.6 kg) IBW/kg (Calculated) : 52.4  Temp (24hrs), Avg:96.1 F (35.6 C), Min:91.4 F (33 C), Max:99.1 F (37.3 C)   Recent Labs Lab 15-Oct-2016 1328  10/15/2016 1848 10-15-2016 2100 October 15, 2016 2227 09/26/2016 0450 09/20/2016 0526 09/21/2016 0831 10/07/2016 1105 10/13/2016 1730 10/10/2016 1734  WBC 3.1*  --  3.5* 4.6  --  7.9  --   --   --   --  16.4*  CREATININE 0.89  --  0.93 1.02*  --  1.19*  --   --   --   --  1.56*  LATICACIDVEN  --   < >  --   --  7.4*  --  11.2* 11.0* 10.2*  --  9.4*  VANCOTROUGH  --   --   --   --   --  52*  --   --   --  31*  --   < > = values in this interval not displayed.  Estimated Creatinine Clearance: 38.1 mL/min (by C-G formula based on SCr of 1.56 mg/dL (H)).    No Known Allergies  Antimicrobials this admission: 2/16 Zosyn  >>  2/16 Vancomycin >>   Dose adjustments this admission: 2/17 1800 Vancomycin decrease to 750mg  q24h  Microbiology results: 2/16  BCx: GNR 1/2 - BCID not detected 2/16  UCx: pending MRSA PCR: negative  Thank you for allowing pharmacy to be a part of this patient's care.  , PharmD, BCPS 10/08/2016 7:02 PM

## 2016-10-14 NOTE — Progress Notes (Signed)
Pharmacy Antibiotic Note  [name] is a 61 y.o. female admitted on 2016/10/12 with sepsis.  Pharmacy has been consulted for Zosyn and Vancomycin dosing.  Plan: Will continue vancomycin 750mg  IV every 12 hours. Initial trough not reliable as collected during vancomycin infusion. Next trough scheduled this PM. Goal trough 15-20 mcg/ml.   Will continue Zosyn 3.375 IV EI every 8 hours.   Height: 5\' 3"  (160 cm) Weight: 177 lb 11.1 oz (80.6 kg) IBW/kg (Calculated) : 52.4  Temp (24hrs), Avg:94.1 F (34.5 C), Min:89.7 F (32.1 C), Max:99 F (37.2 C)   Recent Labs Lab 10-12-16 1146 10/12/2016 1328 10-12-16 1450 10/12/16 1848 2016-10-12 2100 2016/10/12 2227 10/13/2016 0450 10/03/2016 0526  WBC  --  3.1*  --  3.5* 4.6  --  7.9  --   CREATININE  --  0.89  --  0.93 1.02*  --  1.19*  --   LATICACIDVEN 6.8*  --  6.6*  --   --  7.4*  --  11.2*  VANCOTROUGH  --   --   --   --   --   --  52*  --     Estimated Creatinine Clearance: 49.9 mL/min (by C-G formula based on SCr of 1.19 mg/dL (H)).    No Known Allergies  Antimicrobials this admission: 2/16 Zosyn  >>  2/16 Vancomycin >>   Dose adjustments this admission:  Microbiology results: 2/16  BCx: GNR 1/2 - BCID not detected 2/16  UCx: pending MRSA PCR: negative  Thank you for allowing pharmacy to be a part of this patient's care.  3/16, PharmD, BCPS Clinical Pharmacist 10/07/2016 9:00 AM

## 2016-10-14 NOTE — Progress Notes (Signed)
Per family they do not know if brother will be making it in today or tomorrow. Concerns regarding patients output and swelling. Called E link regarding concerns and at this point with lab values and urine output dialysis is not a concern at this time. Blood pressure continues to drop will increase neo as needed.

## 2016-10-14 NOTE — Progress Notes (Signed)
Patient has been terminally extubated per NP Tukov.  Family at bedside. Patient placed on NRB post extubation

## 2016-10-14 NOTE — Discharge Summary (Signed)
Physician Discharge Summary  Patient ID: [name] MRN: [id number] DOB/AGE: 01/15/1956 61 y.o.  Admit date: 10-24-2016 Discharge date: 10/03/2016  Admission Diagnoses: 1. Septic shock (HCC)   2. Abdominal pain, unspecified abdominal location   3. Hyperbilirubinemia   4. Altered mental status, unspecified altered mental status type   5. Dyspnea   6. Encounter for orogastric (OG) tube placement   7. On mechanically assisted ventilation (HCC)   DIC with MOF Refractory septic shock with GNR bacteremia Hypokalemia Anemia Thrombocytopenia Hypoglycemia Elevated troponin-likely demand ischemia Acute hypoxemic/hypercarbic respiratory failure  Discharge Diagnoses:  Active Problems:   Sepsis (HCC)  1. Septic shock (HCC)   2. Abdominal pain, unspecified abdominal location   3. Hyperbilirubinemia   4. Altered mental status, unspecified altered mental status type   5. Dyspnea   6. Encounter for orogastric (OG) tube placement   7. On mechanically assisted ventilation (HCC)   DIC with [facility] Refractory septic shock with GNR bacteremia Hypokalemia Anemia Thrombocytopenia Hypoglycemia Elevated troponin-likely demand ischemia Acute hypoxemic/hypercarbic respiratory failure  HPI: Transported to emergency department by EMS from [facility] with complaint of weakness, altered mental status increasing over the past week with Failure to thrive over one month. Patient is presently nonverbal, hypothermic with a temperature of 89.7. She has a past medical history remarkable for CVA, lupus, hepatitis C, rheumatoid arthritis, hypertension, hyperlipidemia, hypercalcemia, thrombocytopenia prior history of right hemidiaphragm paralysis, interstitial lung disease, major depressive disorder.  Discharged Condition: expired  Hospital Course: Patient was admitted through the ED in septic shock, went into respiratory failure and was emergently intubated. Despite interventions, she went in to DIC with  MOF. Family decided to make her comfort care and she was terminally extubated and expired about 30 minutes later.  Septic shock: Patient with gram-negative rods in blood rate is on vasopressin, norepinephrine and Neo-Synephrine along with broad-spectrum antibiotic coverage to include vancomycin and Zosyn. Has received volume resuscitation, lactic acid has been increasing to 11. Will consider adding dobutamine.  Electrolytes/Fluids: Patient was placed on electrolyte/fluid replacement protocol  With monitoring of lab indices  Anemia/ Thrombocytopenia: Most likely sepsis with marrow suppression.   Coagulopathy: Secondary to sepsis/DIC, Patient has received fresh frozen plasma, platelet transfusion along with transfusion of packed red blood cells last p.m.  Hypoglycemia: Was treated with D10 infusion and blood glucose was monitored Q1HR  Acid-based derangement: pH is significantly  Low and patient was placed on a Bicarb infusion and vent changes were also made.   Positive troponin: Most likely reflects demand ischemia will repeat EKG and enzymes were monitored  Thrombocytopenia with bleeding from IV site, orally and central venous catheter sites: Sepsis-induced marrow suppression possible, also on multiple pharmacologic agents which may cause thrombocytopenia. Plaquenil was held and she was  Transfused 2 units of FFP and 1 unit of platelets  Consults: pulmonary/intensive care  Significant Diagnostic Studies:  labs: All labs reviewed microbiology: blood culture: PENDING, sputum culture: PENDING, urine culture: PENDING and PENDING and radiology: Pulmonary edema. ETT in place  Treatments: IV hydration, antibiotics: vancomycin and Zosyn and respiratory therapy: O2  Discharge Exam: Blood pressure (!) 80/44, pulse (!) 123, temperature 98.5 F (36.9 C), resp. rate (!) 0, height 5\' 3"  (1.6 m), weight 177 lb 11.1 oz (80.6 kg), SpO2 99 %. General Appearance: Chronically ill-appearing  female Neuro:Patient is encephalopathic, sedated and unresponsive HEENT: PERRLA, EOM intact. Pulmonary: Coarse rhonchi appreciated bilaterally CardiovascularNormal S1,S2. No m/r/g.  Abdomen: Benign, Soft, non-tender. Renal: No costovertebral tenderness  Extremities: no cyanosis, clubbing.Lower  extremity edema chronically noted Integumentary: venous stasis discoloration in lower extremities, no rash  Disposition: expired   Total time>30 minutes  Eva Vallee S. [facility] ANP-BC Pulmonary and Critical Care Medicine [facility] Pager [phone] or [phone]

## 2016-10-14 NOTE — Progress Notes (Signed)
S: Met with Mrs. Stebbins's family to discuss care goals given patient's deteriorating health status. Briefly, this is a 61 y/o AA female with multiple comorbidities admitted with refractory septic shock, acute hypoxemic/hypercarbic respiratory failure requiring endotracheal intubation, now in multiorgan failure requiring 4 pressors, persistent acidosis and acute renal failure with anuria. Despite interventions, patient continues to deteriorate clinically.  O:  Vital signs: Blood pressure (!) 80/44, pulse (!) 123, temperature 98.5 F (36.9 C), resp. rate (!) 0, height '5\' 3"'$  (1.6 m), weight 177 lb 11.1 oz (80.6 kg), SpO2 99 %. General Appearance: Chronically ill-appearing  female  Neuro: Patient is encephalopathic, sedated and unresponsive HEENT: PERRLA, EOM intact. Pulmonary: Coarse rhonchi appreciated bilaterally CardiovascularNormal S1,S2.  No m/r/g.   Abdomen: Benign, Soft, non-tender. Renal:  No costovertebral tenderness  Extremities: no cyanosis, clubbing. Lower extremity edema chronically noted Integumentary: Jaundiced, no rash  Assessment DIC with M[facility] Refractory septic shock with GNR bacteremia Hypokalemia Anemia Thrombocytopenia Hypoglycemia Elevated troponin-likely demand ischemia  Plan In consultation with Elink and Dr. Jefferson Fuel, it was agreed that the patient's family be convened immediately for a meeting. Patient's daughter who is the decision maker was contacted. A family meeting was held with her and the rest of the family. They unanimously agreed that at this point, they did not want anymore heroic measures. They expressed their desire to have patient terminally extubated and made full comfort care. The chaplain was paged to the bedside. The family had a prayer session. RT extubated patient uneventfully. All infusions were discontinued except for fentanyl. Patient was placed on a non-rebreather mask. End of life orders placed and support provided to the family.  Dr. Jefferson Fuel  updated on meeting outcome and agrees with current plan.  Total critical care time=60 minutes  [place] S. [facility] ANP-BC Pulmonary and Critical Care Medicine [facility] Pager [phone] or [phone]

## 2016-10-14 NOTE — Progress Notes (Signed)
Pulmonary/critical care  Family meeting Mrs. Kosak status was reviewed with all of her family members. Discussed the issue of overwhelming septic shock with multisystem organ failure to include hemodynamic collapse, respiratory failure, encephalopathy, decreased urine output. Discussed goals of care with family. They are considering their options and considering comfort care. We'll continue aggressive care until family has made a decision  [employer], D.O.

## 2016-10-14 NOTE — Progress Notes (Signed)
Per Dr Lonn Georgia he has spoken with family regarding patients status. At this time awaiting for family decision on the plan of care they would like to precede with. Patient hs not made any urine output at this point. MD aware. Orders to maintain Map at 60.

## 2016-10-14 DEATH — deceased

## 2017-06-06 IMAGING — US US EXTREM LOW VENOUS BILAT
1 series · 13 of 24 positions shown · non-contrast
Comparison: None.

CLINICAL DATA: Chronic bilateral lower extremity edema and soft
tissue ulcerations. Initial encounter.



[Series 1: us extrem low venous bilat · 0.07mm/px · 13 of 56 slices shown]
[im 1/56]
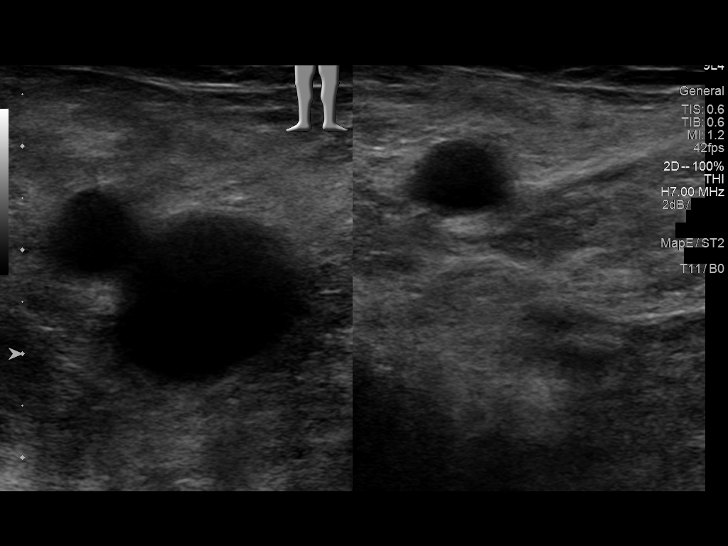
[im 5/56]
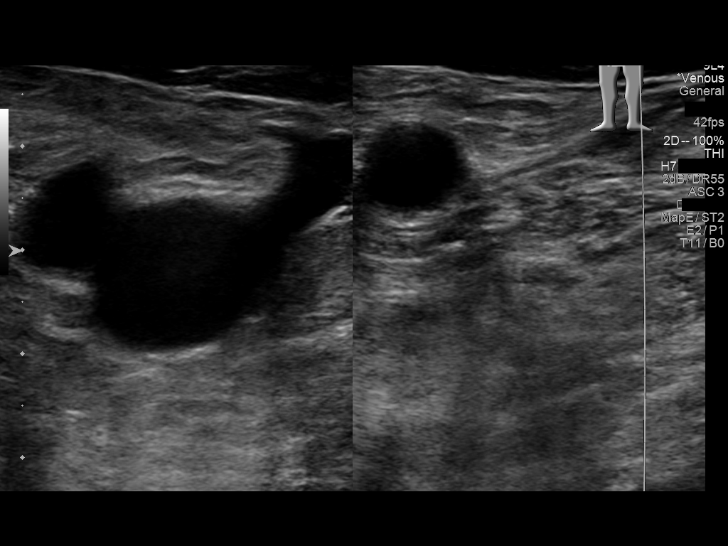
[im 10/56]
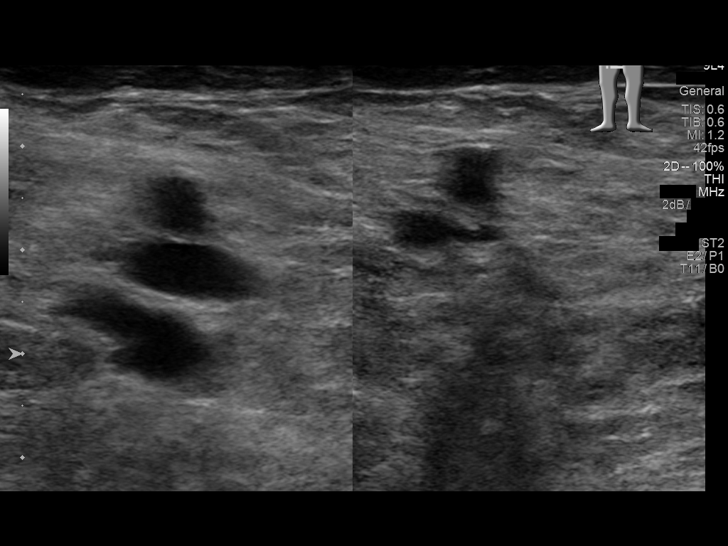
[im 15/56]
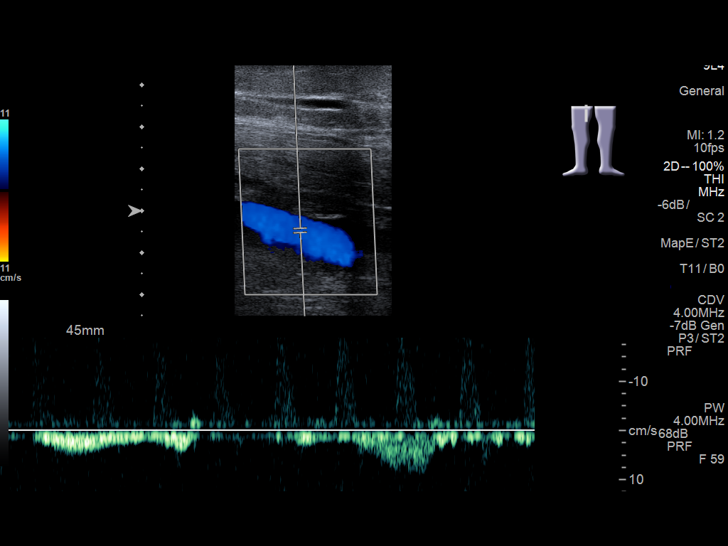
[im 20/56]
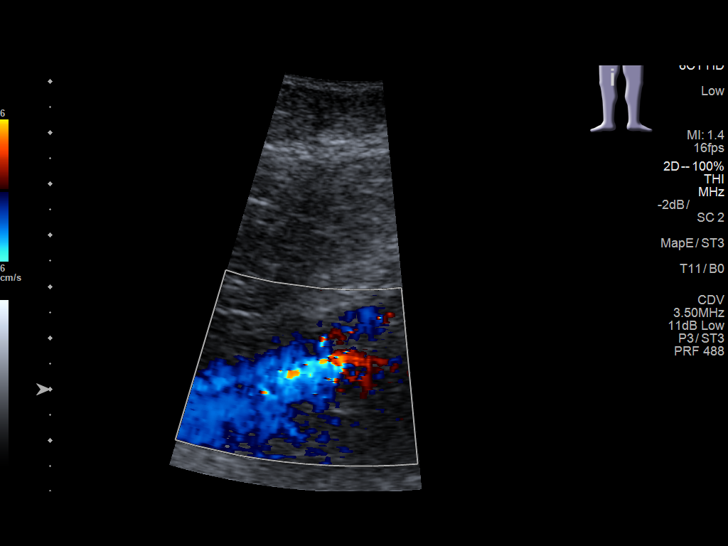
[im 24/56]
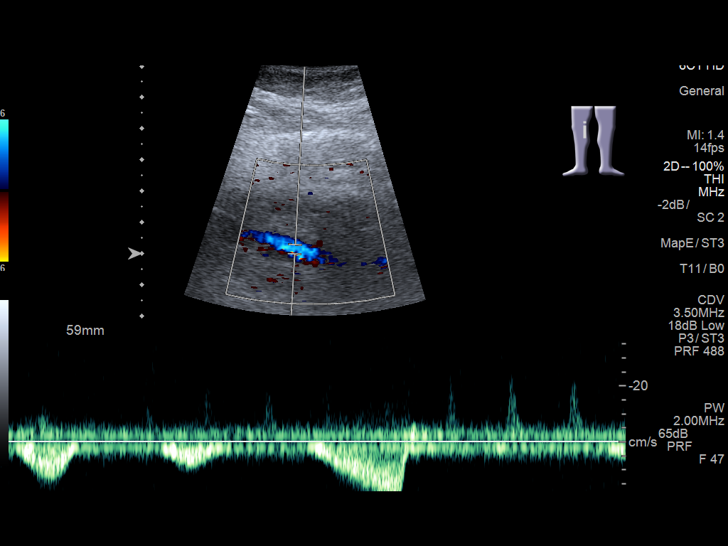
[im 29/56]
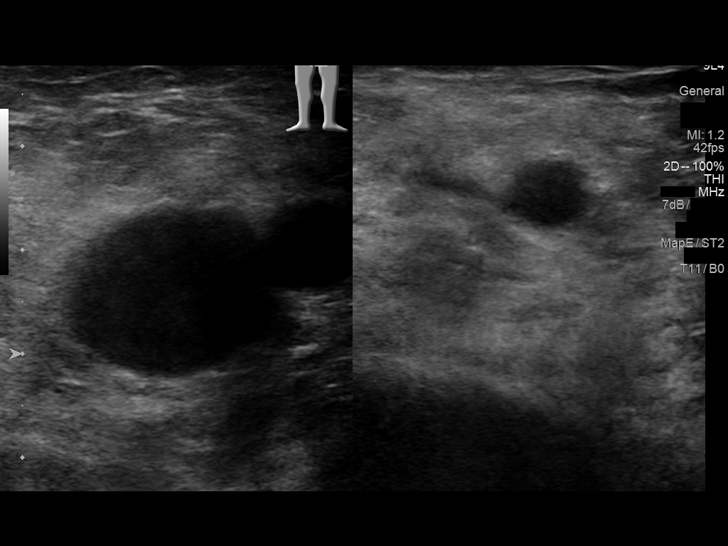
[im 32/56]
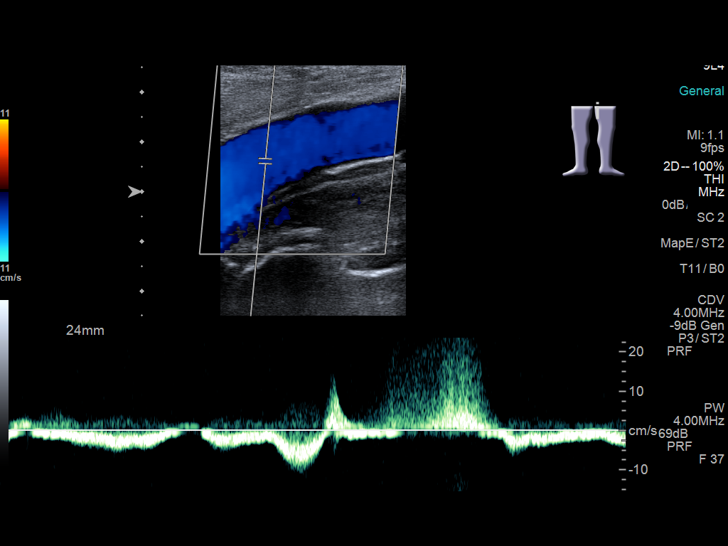
[im 36/56]
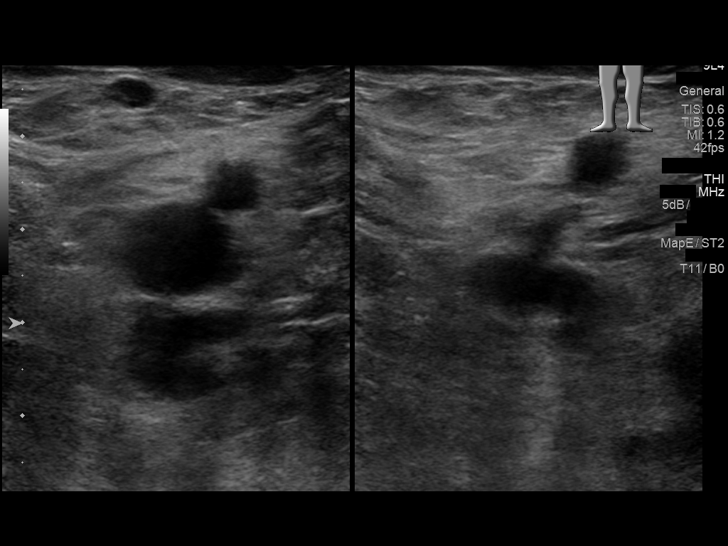
[im 41/56]
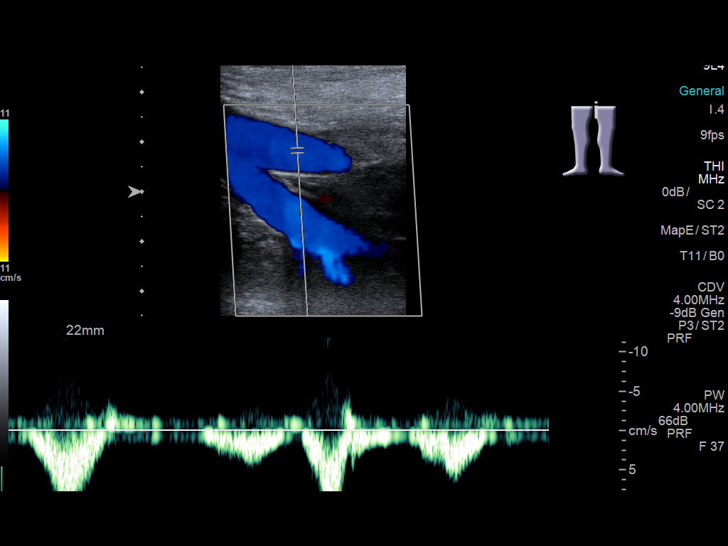
[im 46/56]
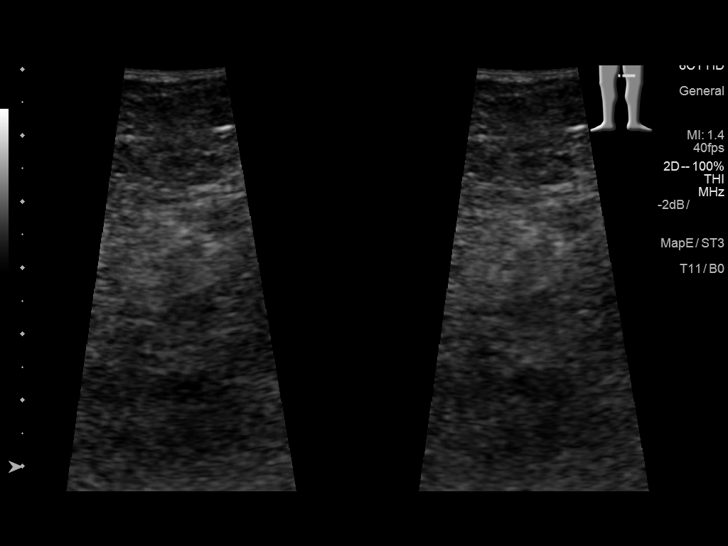
[im 51/56]
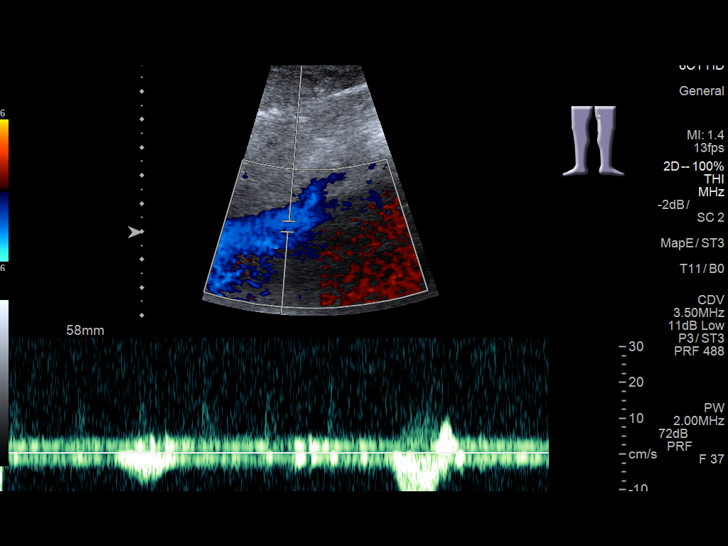
[im 56/56]
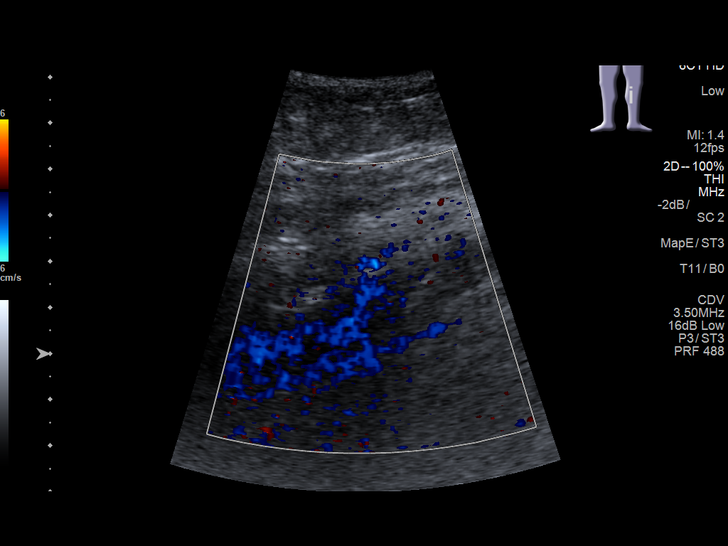

[13 of 24 positions shown; findings below may reference images not displayed]

FINDINGS: RIGHT LOWER EXTREMITY

Common Femoral Vein: No evidence of thrombus. Normal
compressibility, respiratory phasicity and response to augmentation.

Saphenofemoral Junction: No evidence of thrombus. Normal
compressibility and flow on color Doppler imaging.

Profunda Femoral Vein: No evidence of thrombus. Normal
compressibility and flow on color Doppler imaging.

Femoral Vein: No evidence of thrombus. Normal compressibility,
respiratory phasicity and response to augmentation.

Popliteal Vein: No evidence of thrombus. Normal compressibility,
respiratory phasicity and response to augmentation.

Calf Veins: No evidence of thrombus. Normal compressibility and flow
on color Doppler imaging. The peroneal vein is not visualized.

Superficial Great Saphenous Vein: No evidence of thrombus. Normal
compressibility and flow on color Doppler imaging.

Venous Reflux:  None.

Other Findings:  None.

LEFT LOWER EXTREMITY

Common Femoral Vein: No evidence of thrombus. Normal
compressibility, respiratory phasicity and response to augmentation.

Saphenofemoral Junction: No evidence of thrombus. Normal
compressibility and flow on color Doppler imaging.

Profunda Femoral Vein: No evidence of thrombus. Normal
compressibility and flow on color Doppler imaging.

Femoral Vein: No evidence of thrombus. Normal compressibility,
respiratory phasicity and response to augmentation.

Popliteal Vein: No evidence of thrombus. Normal compressibility,
respiratory phasicity and response to augmentation.

Calf Veins: Not well characterized due to the patient's habitus and
overlying soft tissue wounds.

Superficial Great Saphenous Vein: No evidence of thrombus. Normal
compressibility and flow on color Doppler imaging.

Venous Reflux:  None.

Other Findings:  None.
IMPRESSION: No evidence of deep venous thrombosis. Evaluation mildly suboptimal
due to the patient's habitus and overlying soft tissue wounds.

## 2018-08-26 IMAGING — DX DG TIBIA/FIBULA 2V*R*
4 series · 4 of 4 positions shown · non-contrast
Comparison: 12/21/2015

CLINICAL DATA: Open wounds at BILATERAL lower extremities, history
lupus

EXAM:
RIGHT TIBIA AND FIBULA - 2 VIEW

[tibia ap (1 of 2)]
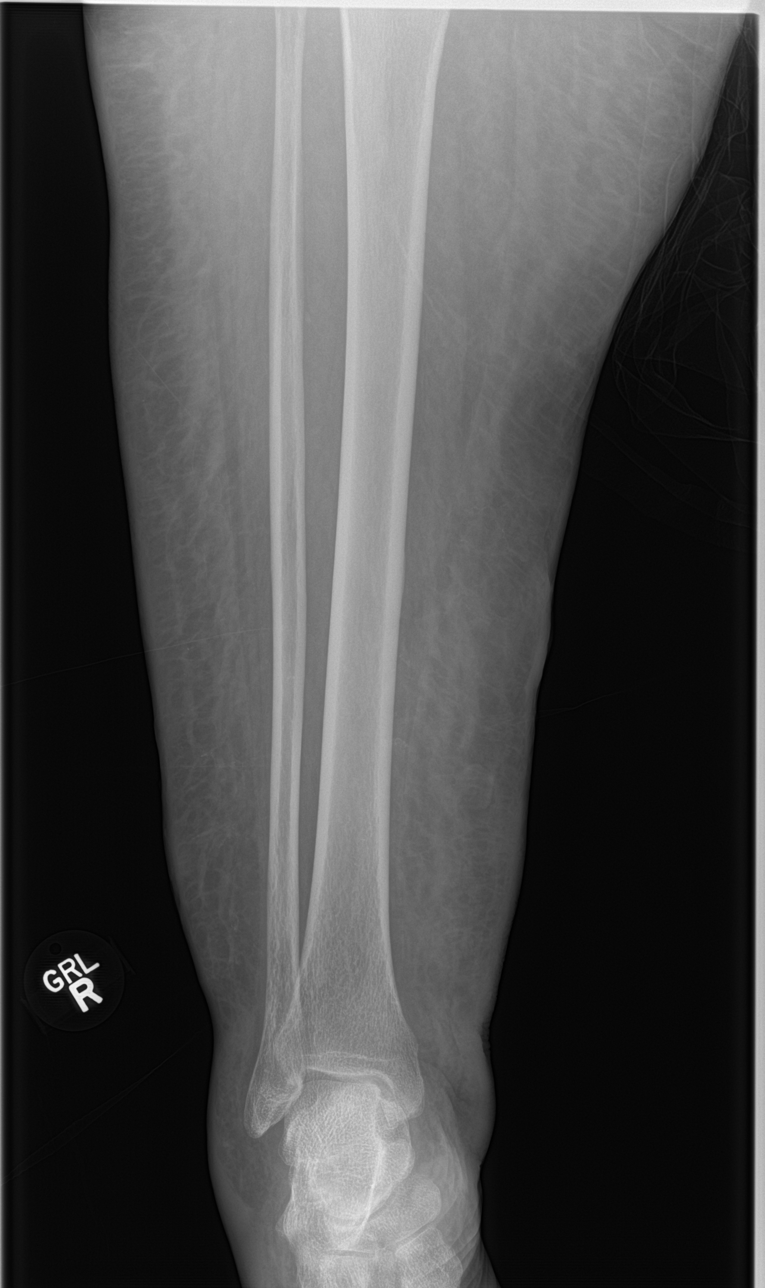

[tibia ap (2 of 2)]
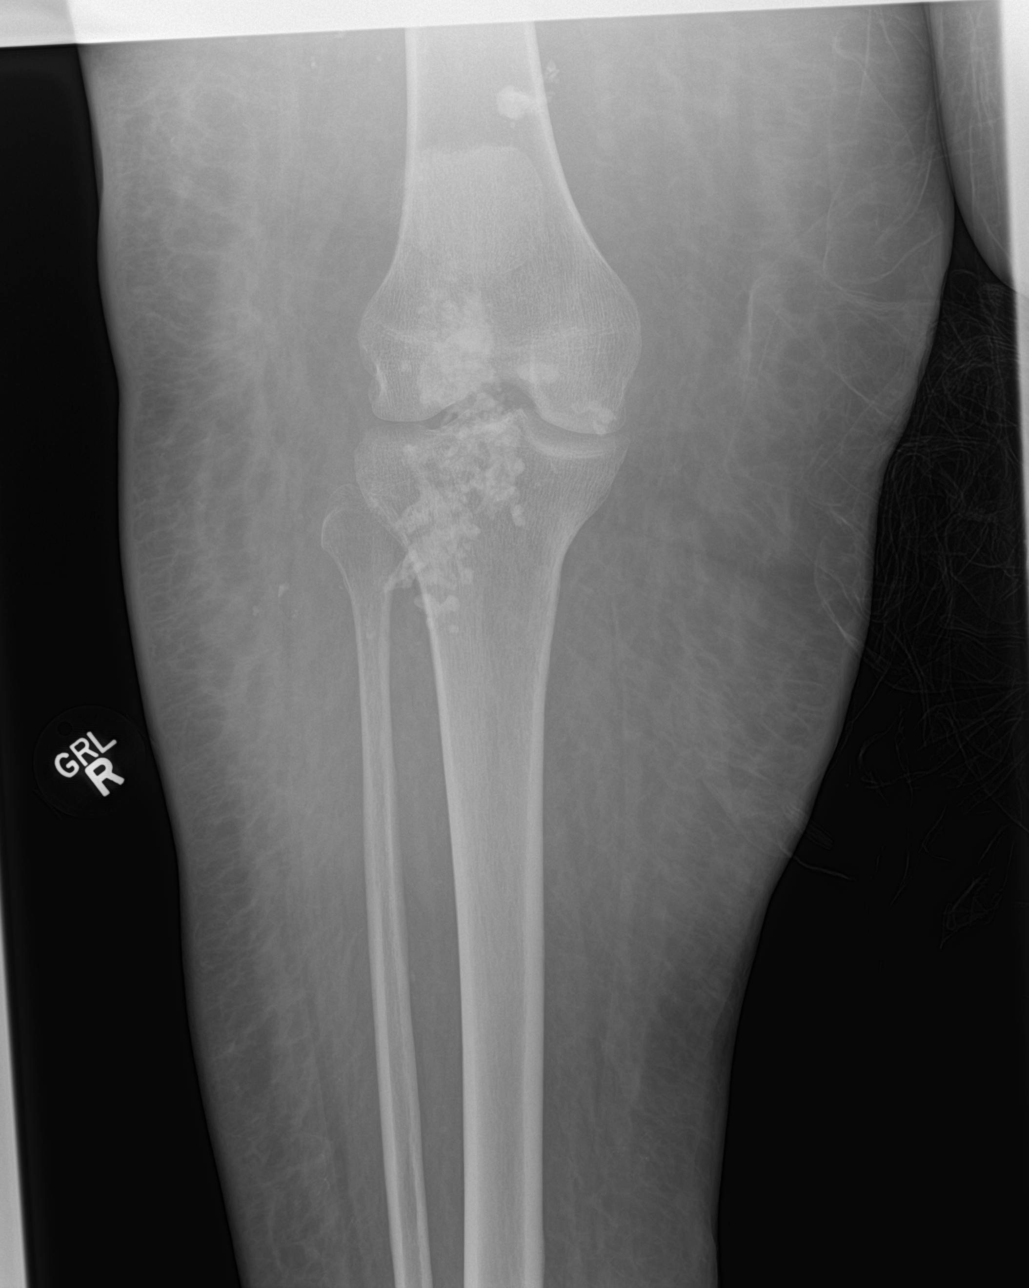

[tibia lat (1 of 2)]
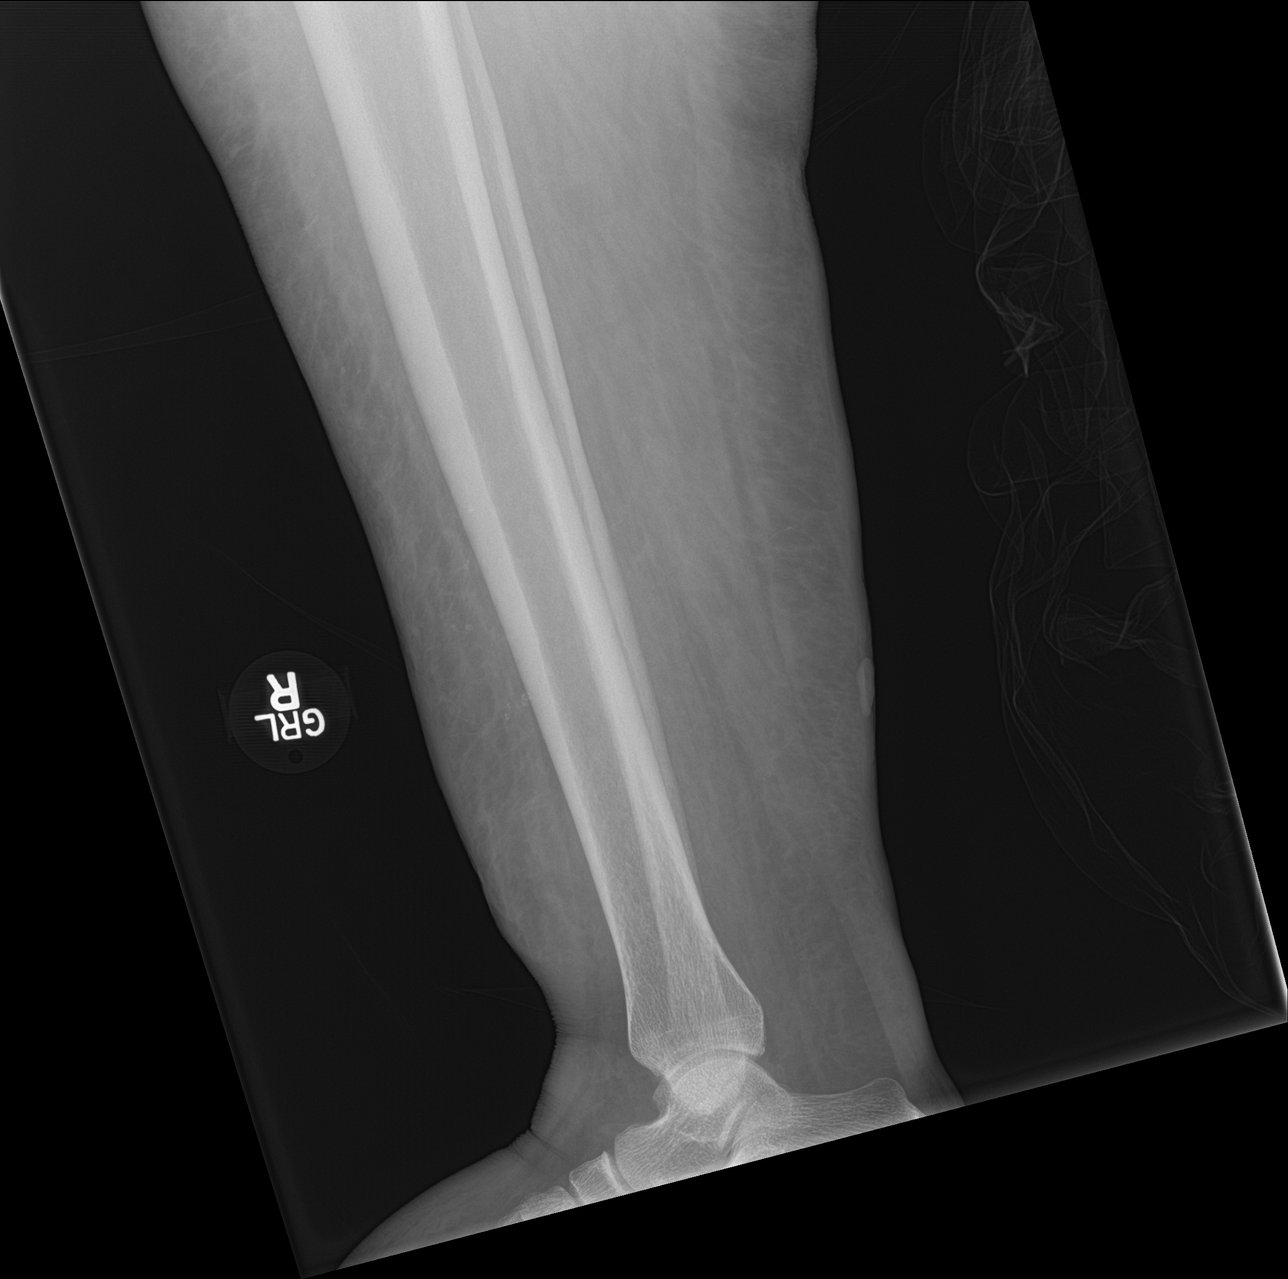

[tibia lat (2 of 2)]
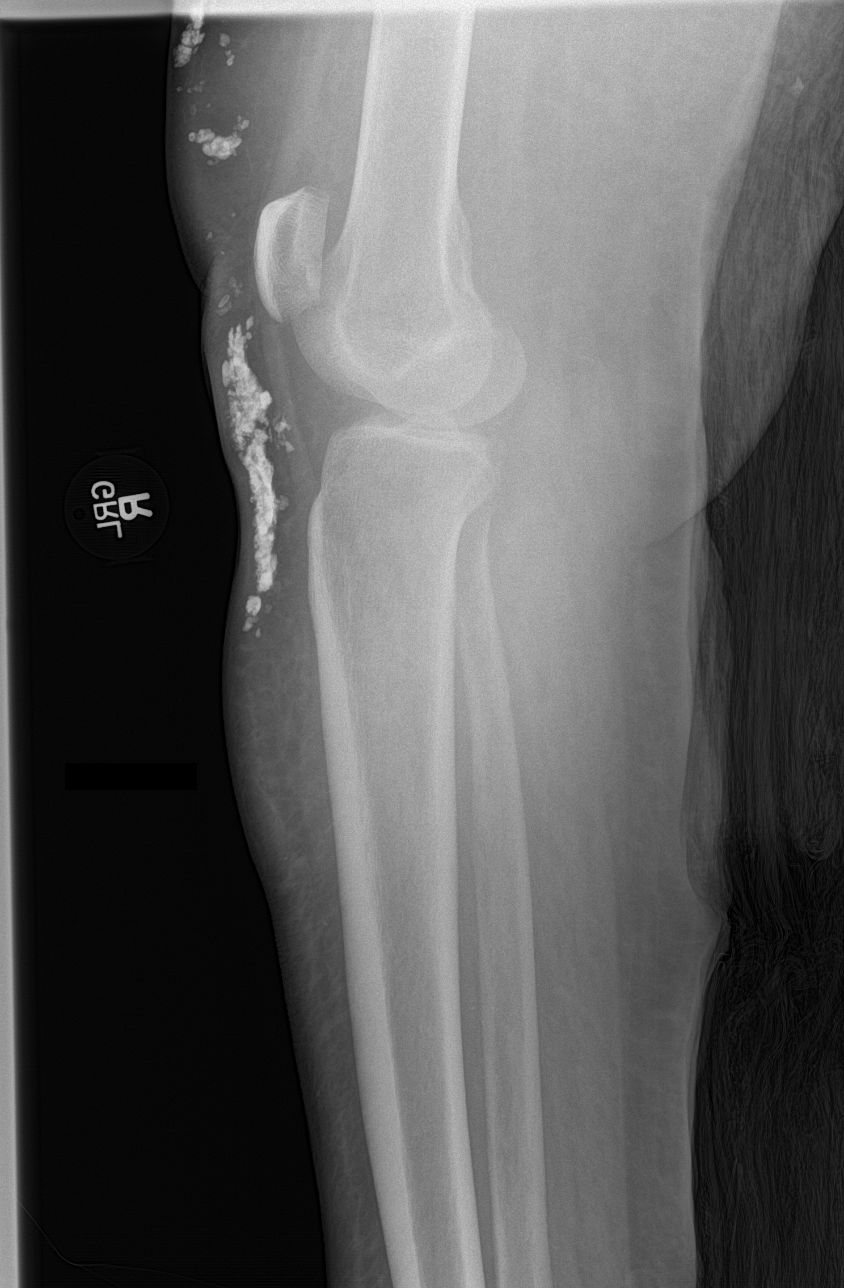

[4 of 4 positions shown; findings below may reference images not displayed]

FINDINGS: Diffuse soft tissue swelling RIGHT lower leg.

Osseous demineralization.

Joint spaces preserved.

No acute fracture or dislocation.

Soft tissue calcifications subcutaneously at the knee and distal
spine appear unchanged.
IMPRESSION: Chronic soft tissue calcifications at the distal thigh and knee
anteriorly.

Diffuse soft tissue swelling without acute osseous abnormalities.
